# Patient Record
Sex: Female | Born: 1960 | Race: White | Hispanic: No | Marital: Married | Smoking: Former smoker
Health system: Southern US, Community
[De-identification: ages and names within clinical notes are randomized; demographics above are authoritative.]

## PROBLEM LIST (undated history)

## (undated) DIAGNOSIS — F419 Anxiety disorder, unspecified: Secondary | ICD-10-CM

## (undated) DIAGNOSIS — Z8669 Personal history of other diseases of the nervous system and sense organs: Secondary | ICD-10-CM

## (undated) DIAGNOSIS — Z87440 Personal history of urinary (tract) infections: Secondary | ICD-10-CM

## (undated) DIAGNOSIS — L409 Psoriasis, unspecified: Secondary | ICD-10-CM

## (undated) DIAGNOSIS — R519 Headache, unspecified: Secondary | ICD-10-CM

## (undated) DIAGNOSIS — R112 Nausea with vomiting, unspecified: Secondary | ICD-10-CM

## (undated) DIAGNOSIS — Z8719 Personal history of other diseases of the digestive system: Secondary | ICD-10-CM

## (undated) DIAGNOSIS — G8929 Other chronic pain: Secondary | ICD-10-CM

## (undated) DIAGNOSIS — T8859XA Other complications of anesthesia, initial encounter: Secondary | ICD-10-CM

## (undated) DIAGNOSIS — E785 Hyperlipidemia, unspecified: Secondary | ICD-10-CM

## (undated) DIAGNOSIS — R51 Headache: Secondary | ICD-10-CM

## (undated) DIAGNOSIS — T4145XA Adverse effect of unspecified anesthetic, initial encounter: Secondary | ICD-10-CM

## (undated) DIAGNOSIS — Z87898 Personal history of other specified conditions: Secondary | ICD-10-CM

## (undated) DIAGNOSIS — Z9889 Other specified postprocedural states: Secondary | ICD-10-CM

## (undated) DIAGNOSIS — L719 Rosacea, unspecified: Secondary | ICD-10-CM

## (undated) DIAGNOSIS — M542 Cervicalgia: Secondary | ICD-10-CM

## (undated) DIAGNOSIS — R3912 Poor urinary stream: Secondary | ICD-10-CM

## (undated) DIAGNOSIS — I10 Essential (primary) hypertension: Secondary | ICD-10-CM

## (undated) DIAGNOSIS — G4733 Obstructive sleep apnea (adult) (pediatric): Secondary | ICD-10-CM

## (undated) DIAGNOSIS — K219 Gastro-esophageal reflux disease without esophagitis: Secondary | ICD-10-CM

## (undated) HISTORY — DX: Other chronic pain: G89.29

## (undated) HISTORY — PX: ABDOMINAL HYSTERECTOMY: SHX81

## (undated) HISTORY — DX: Psoriasis, unspecified: L40.9

## (undated) HISTORY — PX: DIAGNOSTIC LAPAROSCOPY: SUR761

## (undated) HISTORY — DX: Personal history of other diseases of the nervous system and sense organs: Z86.69

## (undated) HISTORY — PX: OTHER SURGICAL HISTORY: SHX169

## (undated) HISTORY — PX: SPINE SURGERY: SHX786

## (undated) HISTORY — PX: TUBAL LIGATION: SHX77

## (undated) HISTORY — PX: DILATION AND CURETTAGE OF UTERUS: SHX78

## (undated) HISTORY — DX: Rosacea, unspecified: L71.9

## (undated) HISTORY — DX: Cervicalgia: M54.2

## (undated) HISTORY — PX: APPENDECTOMY: SHX54

## (undated) HISTORY — DX: Essential (primary) hypertension: I10

## (undated) HISTORY — DX: Hyperlipidemia, unspecified: E78.5

## (undated) HISTORY — PX: CHOLECYSTECTOMY: SHX55

## (undated) HISTORY — DX: Obstructive sleep apnea (adult) (pediatric): G47.33

---

## 1997-05-27 ENCOUNTER — Ambulatory Visit (HOSPITAL_COMMUNITY): Admission: RE | Admit: 1997-05-27 | Discharge: 1997-05-27 | Payer: Self-pay | Admitting: Obstetrics and Gynecology

## 1997-11-13 ENCOUNTER — Other Ambulatory Visit: Admission: RE | Admit: 1997-11-13 | Discharge: 1997-11-13 | Payer: Self-pay | Admitting: Obstetrics and Gynecology

## 1997-12-04 ENCOUNTER — Ambulatory Visit (HOSPITAL_COMMUNITY): Admission: RE | Admit: 1997-12-04 | Discharge: 1997-12-04 | Payer: Self-pay | Admitting: *Deleted

## 1999-02-24 ENCOUNTER — Other Ambulatory Visit: Admission: RE | Admit: 1999-02-24 | Discharge: 1999-02-24 | Payer: Self-pay | Admitting: Obstetrics and Gynecology

## 1999-06-14 ENCOUNTER — Inpatient Hospital Stay (HOSPITAL_COMMUNITY): Admission: RE | Admit: 1999-06-14 | Discharge: 1999-06-16 | Payer: Self-pay | Admitting: Obstetrics and Gynecology

## 1999-06-14 ENCOUNTER — Encounter (INDEPENDENT_AMBULATORY_CARE_PROVIDER_SITE_OTHER): Payer: Self-pay | Admitting: Specialist

## 1999-12-29 ENCOUNTER — Emergency Department (HOSPITAL_COMMUNITY): Admission: EM | Admit: 1999-12-29 | Discharge: 1999-12-29 | Payer: Self-pay | Admitting: Emergency Medicine

## 1999-12-29 ENCOUNTER — Encounter: Payer: Self-pay | Admitting: Emergency Medicine

## 2000-06-27 ENCOUNTER — Other Ambulatory Visit: Admission: RE | Admit: 2000-06-27 | Discharge: 2000-06-27 | Payer: Self-pay | Admitting: Obstetrics and Gynecology

## 2001-10-09 ENCOUNTER — Other Ambulatory Visit: Admission: RE | Admit: 2001-10-09 | Discharge: 2001-10-09 | Payer: Self-pay | Admitting: Obstetrics and Gynecology

## 2002-10-08 ENCOUNTER — Encounter: Payer: Self-pay | Admitting: Neurosurgery

## 2002-10-08 ENCOUNTER — Ambulatory Visit (HOSPITAL_COMMUNITY): Admission: RE | Admit: 2002-10-08 | Discharge: 2002-10-09 | Payer: Self-pay | Admitting: Neurosurgery

## 2002-11-07 ENCOUNTER — Other Ambulatory Visit: Admission: RE | Admit: 2002-11-07 | Discharge: 2002-11-07 | Payer: Self-pay | Admitting: Obstetrics and Gynecology

## 2003-11-25 ENCOUNTER — Observation Stay (HOSPITAL_COMMUNITY): Admission: RE | Admit: 2003-11-25 | Discharge: 2003-11-26 | Payer: Self-pay | Admitting: Neurosurgery

## 2004-08-27 ENCOUNTER — Encounter: Admission: RE | Admit: 2004-08-27 | Discharge: 2004-11-25 | Payer: Self-pay | Admitting: Internal Medicine

## 2005-12-10 ENCOUNTER — Encounter: Admission: RE | Admit: 2005-12-10 | Discharge: 2005-12-10 | Payer: Self-pay | Admitting: Neurosurgery

## 2007-07-25 ENCOUNTER — Emergency Department (HOSPITAL_COMMUNITY): Admission: EM | Admit: 2007-07-25 | Discharge: 2007-07-25 | Payer: Self-pay | Admitting: Family Medicine

## 2007-10-18 ENCOUNTER — Ambulatory Visit: Payer: Self-pay | Admitting: Internal Medicine

## 2007-10-22 ENCOUNTER — Ambulatory Visit: Payer: Self-pay | Admitting: Internal Medicine

## 2007-12-20 ENCOUNTER — Ambulatory Visit: Payer: Self-pay | Admitting: Internal Medicine

## 2008-04-01 ENCOUNTER — Ambulatory Visit: Payer: Self-pay | Admitting: Internal Medicine

## 2008-09-09 ENCOUNTER — Ambulatory Visit: Payer: Self-pay | Admitting: Internal Medicine

## 2008-10-07 ENCOUNTER — Ambulatory Visit: Payer: Self-pay | Admitting: Internal Medicine

## 2008-10-16 ENCOUNTER — Ambulatory Visit: Payer: Self-pay | Admitting: Internal Medicine

## 2008-11-11 ENCOUNTER — Ambulatory Visit: Payer: Self-pay | Admitting: Internal Medicine

## 2008-11-24 ENCOUNTER — Ambulatory Visit: Payer: Self-pay | Admitting: Internal Medicine

## 2008-12-09 ENCOUNTER — Ambulatory Visit: Payer: Self-pay | Admitting: Internal Medicine

## 2008-12-16 ENCOUNTER — Ambulatory Visit: Payer: Self-pay | Admitting: Internal Medicine

## 2009-01-19 ENCOUNTER — Ambulatory Visit: Payer: Self-pay | Admitting: Internal Medicine

## 2009-02-10 ENCOUNTER — Ambulatory Visit: Payer: Self-pay | Admitting: Internal Medicine

## 2009-03-24 ENCOUNTER — Ambulatory Visit: Payer: Self-pay | Admitting: Internal Medicine

## 2009-08-17 ENCOUNTER — Ambulatory Visit: Payer: Self-pay | Admitting: Internal Medicine

## 2009-12-04 ENCOUNTER — Ambulatory Visit: Payer: Self-pay | Admitting: Internal Medicine

## 2010-05-10 ENCOUNTER — Emergency Department (HOSPITAL_COMMUNITY): Payer: BC Managed Care – PPO

## 2010-05-10 ENCOUNTER — Emergency Department (HOSPITAL_COMMUNITY)
Admission: EM | Admit: 2010-05-10 | Discharge: 2010-05-10 | Disposition: A | Discharge status: Home / Self Care | Payer: BC Managed Care – PPO | Attending: Emergency Medicine | Admitting: Emergency Medicine

## 2010-05-10 DIAGNOSIS — S9030XA Contusion of unspecified foot, initial encounter: Secondary | ICD-10-CM | POA: Insufficient documentation

## 2010-05-10 DIAGNOSIS — Y9210 Unspecified residential institution as the place of occurrence of the external cause: Secondary | ICD-10-CM | POA: Insufficient documentation

## 2010-05-10 DIAGNOSIS — W208XXA Other cause of strike by thrown, projected or falling object, initial encounter: Secondary | ICD-10-CM | POA: Insufficient documentation

## 2010-05-10 DIAGNOSIS — F3289 Other specified depressive episodes: Secondary | ICD-10-CM | POA: Insufficient documentation

## 2010-05-10 DIAGNOSIS — M79609 Pain in unspecified limb: Secondary | ICD-10-CM | POA: Insufficient documentation

## 2010-05-10 DIAGNOSIS — I10 Essential (primary) hypertension: Secondary | ICD-10-CM | POA: Insufficient documentation

## 2010-05-10 DIAGNOSIS — F329 Major depressive disorder, single episode, unspecified: Secondary | ICD-10-CM | POA: Insufficient documentation

## 2010-05-10 DIAGNOSIS — Z79899 Other long term (current) drug therapy: Secondary | ICD-10-CM | POA: Insufficient documentation

## 2010-05-10 DIAGNOSIS — Z794 Long term (current) use of insulin: Secondary | ICD-10-CM | POA: Insufficient documentation

## 2010-05-10 DIAGNOSIS — E119 Type 2 diabetes mellitus without complications: Secondary | ICD-10-CM | POA: Insufficient documentation

## 2010-05-10 DIAGNOSIS — M7989 Other specified soft tissue disorders: Secondary | ICD-10-CM | POA: Insufficient documentation

## 2010-05-10 DIAGNOSIS — Z9889 Other specified postprocedural states: Secondary | ICD-10-CM | POA: Insufficient documentation

## 2010-05-20 ENCOUNTER — Other Ambulatory Visit: Payer: Self-pay | Admitting: Internal Medicine

## 2010-05-21 NOTE — Discharge Summary (Signed)
[facility]  Patient:    AADYA, KINDLER                    MRN: [id number] Adm. Date:  [id number] Disc. Date: [id number] Attending:  Osborn Coho                           Discharge Summary  PRINCIPAL DISCHARGE DIAGNOSES: 1. Metrorrhagia. 2. Chronic pelvic pain.  PRINCIPAL PROCEDURES:  Total abdominal hysterectomy with right salpingo-oophorectomy.  HISTORY OF PRESENT ILLNESS:  Ms. Leclere is a 50 year old white female, G2, P2, who presented for total abdominal hysterectomy with possible bilateral salpingo-oophorectomy on June 14, 1999, secondary to a worsening history of metrorrhagia and pelvic pain.  The patient had two previous laparoscopies to tread adhesions and endometriosis prior to this procedure being performed. She also had a hysteroscopy with dilatation and curettage.  For complete description of the events which led up to this procedure, please see the dictated history and physical.  HOSPITAL COURSE:  The patient underwent a total abdominal hysterectomy with right salpingo-oophorectomy on June 14, 1999.  A complete description of this procedure can be found in the dictated operative note.  The pathology was benign.  The patients postoperative course was unremarkable.  She was discharged to home on postoperative day #2.  At that time she was ambulating without difficulty and eating a regular diet.  DISPOSITION:  The patient was discharged to home.  FOLLOW-UP:  She was instructed to follow up in the office in one week.  DISCHARGE MEDICATIONS:  She was sent home with Tylox to take p.r.n.  CONDITION ON DISCHARGE:  Stable. DD:  09/03/99 TD:  09/06/99 Job: [id number]

## 2010-05-21 NOTE — Op Note (Signed)
[facility]  Patient:    Melissa Benson, Melissa Benson                    MRN: [id number] Proc. Date: 06/14/99 Adm. Date:  [id number] Attending:  Osborn Coho                           Operative Report  PREOPERATIVE DIAGNOSES: 1. Menometrorrhagia. 2. Chronic pelvic pain. 3. History of endometriosis.  POSTOPERATIVE DIAGNOSES: 1. Menometrorrhagia. 2. Chronic pelvic pain. 3. History of endometriosis.  PROCEDURE:  Total abdominal hysterectomy with right salpingo-oophorectomy.  SURGEON:  Mark E. Dareen Piano, M.D.  ASSISTANT:  Luvenia Redden, M.D.  ANESTHESIA:  General endotracheal.  ANTIBIOTICS:  Ancef 1 g.  DRAINS:  Foley to bedside drainage.  ESTIMATED BLOOD LOSS:  300 cc.  COMPLICATIONS:  None.  SPECIMENS: 1. Skin tag from the mons. 2. Uterus and cervix. 3. Right tube and ovary.  DESCRIPTION OF PROCEDURE:  The patient was taken to the operating room, where she was placed in the dorsal supine position.  A general endotracheal anesthetic was administered without complications.  She was then prepped in the usual fashion for this procedure.  A Foley catheter was placed.  A Pfannenstiel incision was made.  This was carried down to the fascia.  The fascia was entered in the midline and extended laterally.  The rectus muscles were then sharply dissected from the fascia.  The rectus muscles were divided in the midline and taken superiorly and inferiorly.  Parietal peritoneum was entered sharply.  The patient was then placed in Trendelenburg.  Examination revealed that the patient had an omental adhesion across the entire anterior abdominal wall in the right upper quadrant.  This is where a past cholecystectomy was performed.  There were also adhesions to the umbilical hernia repair in the midline.  The para-aortic and pelvic lymph nodes were normal.  Kidneys were normal.  At this point, OConnor-OSullivan retractor was placed; however, due to the  patients obesity, this was not successful and a large Balfour was used.  The bowel was then packed away with two wet laps. On examination, the patient was noted to have some endometriosis in the right adnexa.  The left adnexa appeared to be normal.  There was no endometriosis in the pelvis or in the posterior cul-de-sac or the anterior cul-de-sac.  At this point, the round ligament on the left was ligated with 0 Monocryl suture and transected with the Bovie.  The anterior and posterior leaf of the broad ligament were then opened.  A window was made in the posterior leaf of the broad ligament, and the ovarian ligament and fallopian tube were clamped, cut, and ligated x 2 with 0 Monocryl suture.  The uterine vessels were then skeletonized, clamped, cut, and ligated with 0 Monocryl suture.  On the right side, the round ligament was ligated with 0 Monocryl suture and transected with the Bovie.  The broad leaf was then opened, the infundibulopelvic ligament opened, clamped, cut, and ligated with 0 Monocryl suture x 2.  The uterine vessels were then skeletonized, clamped, cut, and ligated with 0 Monocryl suture.  The cardinal ligaments were serially clamped, cut, and ligated with 0 Monocryl suture.  Once the level of the external os was reached a clamp was placed on the right and the vagina was entered.  The vagina was then circumscribed with the Satinsky scissors and the cervix and uterus removed.  At  this point, sutures were placed at the vaginal angles using one 0 Vicryl suture in a Heaney fashion.  The remaining cuff was closed using one Vicryl suture in a running locking fashion.  The pelvis was then copiously irrigated and several small areas of bleeding were made hemostatic with the Bovie.  Again, the left ovary was inspected and appeared to be normal.  At this point, the retractor was removed, the laps were removed.  The parietal peritoneum and rectus muscles were then reapproximated in  the midline using 2-0 Monocryl in a running fashion.  The fascia was closed using 0 Monocryl suture in a running fashion.  Subcuticular tissue was made hemostatic with the Bovie, 2-0 plain gut suture was used in interrupted fashion to close the subcuticular tissue, stainless steel clips were used to close the skin.  The patient tolerated the procedure well.  She was taken to the recovery room in stable condition.  Instrument and lap counts were correct x 2. DD:  06/14/99 TD:  06/16/99 Job: [id number]

## 2010-05-21 NOTE — Op Note (Signed)
NAME:  Melissa Benson, Melissa Benson                       ACCOUNT NO.:  [id number]   MEDICAL RECORD NO.:  [id number]                   PATIENT TYPE:  OIB   LOCATION:  NA                                   FACILITY:  [facility]   PHYSICIAN:  Danae Orleans. Venetia Maxon, M.D.               DATE OF BIRTH:  1960/02/11   DATE OF PROCEDURE:  10/08/2002  DATE OF DISCHARGE:                                 OPERATIVE REPORT   PREOPERATIVE DIAGNOSIS:  Herniated cervical disc C6-C7 with spondylosis,  degenerative disc disease, and cervical radiculopathy.   POSTOPERATIVE DIAGNOSIS:  Herniated cervical disc C6-C7 with spondylosis,  degenerative disc disease, and cervical radiculopathy.   PROCEDURE:  Anterior cervical decompression and fusion C6-C7 with allograft  and anterior cervical plates.   SURGEON:  Danae Orleans. Venetia Maxon, M.D.   ANESTHESIA:  General endotracheal anesthesia.   ESTIMATED BLOOD LOSS:  Minimal.   COMPLICATIONS:  None.   DISPOSITION:  Recovery room.   INDICATIONS FOR PROCEDURE:  Makinzee Durley is a 50 year old woman with  severe neck and right upper extremity with a large herniated disc at the C6-  C7 level on the right.  It was elected to take her to surgery for anterior  cervical decompression and fusion at this affected level.   PROCEDURE:  Ms. Maclin was brought to the operating room.  Following  satisfactory uncomplicated induction of general endotracheal anesthesia and  placement of intravenous  lines, the patient was placed in a supine position  on the operating table.  Her neck was placed in slight extension.  She was  placed in 10 pounds of halter traction.  Her anterior neck was then prepped  and draped in the usual sterile fashion.  An incision was made from the  midline to the anterior border of the sternocleidomastoid muscle overlying  the lowest neck creases and carried through approximately 1-5 inches of  adipost tissue to the border of the sternocleidomastoid muscle.  Using blunt  dissection, the carotid sheath was kept lateral, the trachea and esophagus  kept medial, and the anterior cervical spine was identified.  A level which  was felt to correspond to the C5-C6 level had a bent spinal needle placed  and this was confirmed on interoperative x-ray.  It was not possible to  visualize C6-C7 because of the patient's large body habitus.  The longus  colli muscles were taken down from the anterior cervical spine from the C6  to C7 bilaterally using electrocautery and Key elevator.  A self-retaining  Shadowline retractor was placed to facilitate exposure of the C6-C7  interspaces.  This was then incised with a 15 blade and disc material was  removed in a piecemeal fashion using a variety of Carlens curets and  pituitary rongeurs.  The disc spreader was placed.  There was evidence of a  subligamentous disc herniation at C6-C7 on the right with significant  compression of the right C7 nerve  root and lateral aspect of the spinal  cord.  This was decompressed.  The posterior longitudinal ligament was then  removed in a piecemeal fashion along with uncinate spurs which were drilled  down using an A2 equivalent bur.  The endplates were also decorticated.  After utilizing a trial sizer which demonstrated good sizing of the 8 mm  graft, an 8 mm cortical cancellous bone graft was reconstituted in  Bacitracin and inserted in the interspace and counter sunk appropriately.  The 22 mm Trinica anterior cervical plate was then affixed to the anterior  cervical spine using 14 mm variable angle screws at C6 and C7, two at each  level.  The locking mechanisms were engaged.  Final x-ray was not obtained  because it was felt that it would not be possible to visualize to this  level.  The wound was then copiously irrigated with Bacitracin and saline  and the platysmal layer was closed with 3-0 Vicryl sutures and the skin  edges were reapproximated with a running 4-0 Vicryl subcuticular  stitch.  The wound was dressed with Dermabond.  The patient was extubated in the  operating room and taken to the recovery room in stable satisfactory  condition having tolerated the operation well.  Counts were correct at the  end of the case.                                               Danae Orleans. Venetia Maxon, M.D.    JDS/MEDQ  D:  10/08/2002  T:  10/08/2002  Job:  [id number]

## 2010-05-21 NOTE — Op Note (Signed)
NAMEALONZO, Melissa Benson NO.:  [id number]   MEDICAL RECORD NO.:  [id number]          PATIENT TYPE:  INP   LOCATION:  2899                         FACILITY:  [facility]   PHYSICIAN:  Danae Orleans. Venetia Maxon, M.D.  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   OPERATION PERFORMED:  Posterior cervical fusion C6-7 with Vertex system  along with Kronos bone allograft substitute and bone morphogenic protein.   SURGEON:  Danae Orleans. Venetia Maxon, M.D.   ANESTHESIA:  General endotracheal.   ESTIMATED BLOOD LOSS:  Minimal.   COMPLICATIONS:  None.   DISPOSITION:  Recovery.   INDICATIONS FOR PROCEDURE:  Melissa Benson is a 50 year old woman who had  previously undergone anterior cervical decompression and fusion at the C6-7  level.  She did well following surgery but then came back to the office  complaining of significant neck and upper extremity pain and was found to  have a broken screw at C7 suggestive of pseudoarthrosis.  She had an MRI  which did not show any other significant pathology at other levels and  because of the patient's large size and morbid obesity and very short neck,  it was felt that it would be more prudent to go ahead with revision surgery  from the posterior approach to treat her pseudoarthrosis with posterior  cervical instrumentation and fusion.   DESCRIPTION OF PROCEDURE:  Ms. Gregory was brought to the operating room.  Following satisfactory and uncomplicated induction of general endotracheal  anesthesia and placement of intravenous lines, the patient was placed in  three-pin head fixation, was turned carefully into a prone position on chest  rolls and her head was locked with the Mayfield headholder in neutral  alignment.  Her shoulders were taped after appropriate padding with tape to  facilitate x-ray visualization.  The C-arm was used and because of the  patient's large body habitus, it was not possible to see down to the C6-7  level; however, it was possible to see to C5.  Her posterior neck was then  shaved, prepped and draped in the usual sterile fashion.  The area of  planned incision was infiltrated with 0.25% Marcaine and 0.5% lidocaine  1:200,000 epinephrine.  Incision was made overlying the C5 through C7  levels, carried through copious adipose tissue to the posterior cervical  fascia which was incised bilaterally, subperiosteal dissection was performed  exposing the C5 spinous processes and C6 and C7 spinous processes, laminae,  lateral masses.  Self-retaining retractor was placed to facilitate exposure.  Using intraoperative x-ray, a marker probe was placed at what was felt to be  the C5 spinous process and intraoperative x-ray confirmed this to be the C5  level.  The C6-7 level appeared to have reduced mobility compared to normal  but clearly there was motion at this level.  It  was therefore elected to  proceed with plan and the C6-7 facet joints were decorticated.  Bone  morphogenic protein was mixed.  Posterior cervical instrumentation placed  with 14 mm Vertex screws in the lateral masses of C6 and C7.  All screws had  excellent purchase and their positioning was performed in the usual fashion  with appropriate angulation of the screws. The rods were cut to the  appropriate length.  The facet joints and laminae were decorticated  overlying C6 and C7 levels.  Bone morphogenic protein was then placed along  with medium size Kronos bone allograft substitute and this was packed into  facet joints and along the posterior elements at the appropriate level.  Prior to doing so, the wound was copiously irrigated with  bacitracin saline.  The posterior cervical fascia was then closed with 0 Vicryl sutures.  The  subcutaneous tissue was reapproximated with 2-0 Vicryl interrupted inverted  sutures and skin edges were reapproximated with interrupted 3-0 Vicryl  subcuticular stitch.  The wound was dressed with benzoin and Steri-Strips,  Telfa gauze and tape.  The patient was extubated in the operating room and  taken to the recovery room in stable and satisfactory condition having  tolerated the operation well.  Counts were correct at the end of the case.      Jose   JDS/MEDQ  D:  11/25/2003  T:  11/25/2003  Job:  [id number]

## 2010-05-21 NOTE — H&P (Signed)
[facility]  Patient:    Melissa Benson, Melissa Benson                    MRN: [id number] Adm. Date:  [id number] Attending:  Osborn Coho                         History and Physical  HISTORY OF PRESENT ILLNESS:  Ms. Pistole is a 50 year old white female G2, P2 who presents today for TAH and possible BSO secondary to a history of worsening metrorrhagia and pelvic pain. The patient has been bothered with these symptoms for several years. She has undergone two laparoscopies to treat adhesions and endometriosis. She has also had a hysteroscopy with dilatation and curettage. The patient states that over the last 3-4 months, she has had worsening symptoms of pelvic pain. The pain begins approximately 10 days before her menstrual cycle and ends when her menstrual cycle begins. She also has occasional dyspareunia.  PAST MEDICAL HISTORY:  SURGICAL HISTORY:  Cholecystectomy, appendectomy, umbilical hernia repair. She has also had the surgeries listed above.  ALLERGIES:  MORPHINE, CODEINE and TYLOX which causes nausea and vomiting. She is currently taking Motrin as needed. She has on smoking or alcohol history.  PHYSICAL EXAMINATION:  GENERAL:  The patient is an overweight white female in no apparent distress.  HEENT:  Within normal limits.  LUNGS:  Clear to auscultation.  CARDIOVASCULAR:  Reveals a regular rate and rhythm without a murmur.  ABDOMEN:  Obese, nontender, nondistended. There is no organomegaly. She has 3 abdominal scars.  EXTREMITIES:  Within normal limits.  PELVIC:  Normal external genitalia. The vagina is without lesions or discharge. The cervix is parous. There is no cervical motion tenderness. The uterus is not easily palpated. There are no masses palpated in the pelvis.  IMPRESSION: 1. Metrorrhagia. 2. Chronic pelvic pain. 3. Probable recurrence of endometriosis.  PLAN:  Proceed with total abdominal hysterectomy, possible  bilateral salpingo-oophorectomy. DD:  06/14/99 TD:  06/14/99 Job: [id number]

## 2010-06-03 ENCOUNTER — Ambulatory Visit
Admission: RE | Admit: 2010-06-03 | Discharge: 2010-06-03 | Disposition: A | Discharge status: Home / Self Care | Payer: BC Managed Care – PPO | Source: Ambulatory Visit | Attending: Internal Medicine | Admitting: Internal Medicine

## 2010-06-03 ENCOUNTER — Other Ambulatory Visit: Payer: Self-pay | Admitting: Internal Medicine

## 2010-06-03 ENCOUNTER — Ambulatory Visit (INDEPENDENT_AMBULATORY_CARE_PROVIDER_SITE_OTHER): Payer: BC Managed Care – PPO | Admitting: Internal Medicine

## 2010-06-03 ENCOUNTER — Encounter: Payer: Self-pay | Admitting: Internal Medicine

## 2010-06-03 VITALS — BP 142/78 | HR 108 | Temp 99.6°F | Ht 64.5 in | Wt 247.0 lb

## 2010-06-03 DIAGNOSIS — IMO0002 Reserved for concepts with insufficient information to code with codable children: Secondary | ICD-10-CM | POA: Insufficient documentation

## 2010-06-03 DIAGNOSIS — T1490XA Injury, unspecified, initial encounter: Secondary | ICD-10-CM

## 2010-06-03 DIAGNOSIS — B9789 Other viral agents as the cause of diseases classified elsewhere: Secondary | ICD-10-CM

## 2010-06-03 DIAGNOSIS — E1165 Type 2 diabetes mellitus with hyperglycemia: Secondary | ICD-10-CM | POA: Insufficient documentation

## 2010-06-03 DIAGNOSIS — Z9989 Dependence on other enabling machines and devices: Secondary | ICD-10-CM | POA: Insufficient documentation

## 2010-06-03 DIAGNOSIS — J029 Acute pharyngitis, unspecified: Secondary | ICD-10-CM

## 2010-06-03 DIAGNOSIS — G4733 Obstructive sleep apnea (adult) (pediatric): Secondary | ICD-10-CM | POA: Insufficient documentation

## 2010-06-03 DIAGNOSIS — I10 Essential (primary) hypertension: Secondary | ICD-10-CM | POA: Insufficient documentation

## 2010-06-03 DIAGNOSIS — B349 Viral infection, unspecified: Secondary | ICD-10-CM

## 2010-06-03 LAB — POCT RAPID STREP A (OFFICE): Rapid Strep A Screen: NEGATIVE

## 2010-06-03 MED ORDER — PROMETHAZINE HCL 25 MG PO TABS: 25.0000 mg | ORAL_TABLET | Freq: Four times a day (QID) | ORAL | Status: DC | PRN

## 2010-06-03 NOTE — Progress Notes (Signed)
  Subjective:    Patient ID: Melissa Benson, female    DOB: Mar 26, 1960, 50 y.o.   MRN: [id number]    HPI2 day hx of N and V with sore throat. No cough. No rhinorrhea. Granddaughter dx with hand foot and mouth disease this week. Pt has had temp up to 100.4 degrees. Malaise and fatigue. Out of work yesterday and today. Also, has headache.  Also, around May 7th, an oxygen tank dropped accidentally on her foot at [facility] while she was with her father who had cardiac cath there. She was taken to ER in wheelchair and at ER registration nurse accidentally pushed wheelchair and jammed her Right toes into a desk. Could not move toes for a couple of hours. Still sore and a bit sore over dorsal aspect of foot.    Review of Systems     Objective:   Physical Exam  HENT:  Mouth/Throat: Oropharynx is clear and moist. No oropharyngeal exudate.       TMs clear   Eyes: Right eye exhibits no discharge. Left eye exhibits no discharge.  Neck: Neck supple.  Pulmonary/Chest: Breath sounds normal. No respiratory distress. She has no wheezes. She has no rales.  Lymphadenopathy:    She has no cervical adenopathy.   Right Foot: tender over 1st and 2nd metatarsals.        Assessment & Plan:  Imp- Viral Syndrome- Rapid strep screen is negative.      -   Contusion Right foot r/o  Occult Fracture Plan Re-Xray foot.  BJ:[id number] 25mg  tabs (#30) one po q 6 hours prn nausea

## 2010-06-03 NOTE — Patient Instructions (Signed)
Clear liquids until nausea has resolved then advance diet slowly. Obtain 4 inch ace wrap to wrap your right foot. Apply ice to foot 20 minutes twice a day. Stay out of work tomorrow.

## 2010-09-29 ENCOUNTER — Other Ambulatory Visit: Payer: Self-pay | Admitting: Internal Medicine

## 2010-10-30 ENCOUNTER — Other Ambulatory Visit: Payer: Self-pay | Admitting: Internal Medicine

## 2010-12-02 ENCOUNTER — Encounter: Payer: Self-pay | Admitting: Internal Medicine

## 2010-12-02 ENCOUNTER — Ambulatory Visit (INDEPENDENT_AMBULATORY_CARE_PROVIDER_SITE_OTHER): Payer: BC Managed Care – PPO | Admitting: Internal Medicine

## 2010-12-02 VITALS — BP 134/72 | HR 92 | Temp 98.8°F | Wt 248.0 lb

## 2010-12-02 DIAGNOSIS — G8929 Other chronic pain: Secondary | ICD-10-CM

## 2010-12-02 DIAGNOSIS — Z8669 Personal history of other diseases of the nervous system and sense organs: Secondary | ICD-10-CM

## 2010-12-02 DIAGNOSIS — Z79899 Other long term (current) drug therapy: Secondary | ICD-10-CM

## 2010-12-02 DIAGNOSIS — M542 Cervicalgia: Secondary | ICD-10-CM

## 2010-12-02 DIAGNOSIS — E785 Hyperlipidemia, unspecified: Secondary | ICD-10-CM

## 2010-12-02 DIAGNOSIS — E119 Type 2 diabetes mellitus without complications: Secondary | ICD-10-CM

## 2010-12-02 DIAGNOSIS — Z23 Encounter for immunization: Secondary | ICD-10-CM

## 2010-12-02 LAB — HEPATIC FUNCTION PANEL
ALT: 45 U/L — ABNORMAL HIGH (ref 0–35)
AST: 41 U/L — ABNORMAL HIGH (ref 0–37)
Albumin: 4.7 g/dL (ref 3.5–5.2)

## 2010-12-02 LAB — LIPID PANEL
Cholesterol: 176 mg/dL (ref 0–200)
HDL: 45 mg/dL (ref >39)
Total CHOL/HDL Ratio: 3.9 Ratio
Triglycerides: 118 mg/dL (ref <150)

## 2010-12-02 LAB — HEMOGLOBIN A1C: Hgb A1c MFr Bld: 6.7 % — ABNORMAL HIGH (ref <5.7)

## 2010-12-02 NOTE — Progress Notes (Signed)
  Subjective:    Patient ID: Melissa Benson, female    DOB: [birth year], 50 y.o.   MRN: [id number]  HPI 50 year old white female with history of hypertension, diabetes mellitus, chronic left neck pain, migraine headaches, sleep apnea for six-month recheck. She had to cancel recent appointment with Dr. Lucianne Muss, endocrinologist because patient's father passed away. He apparently had a lung condition and had respiratory failure. Patient is off Crestor at the present time. She did not get her prescription refilled recently. Fasting lipid panel has been drawn. Influenza immunization given today. Blood pressure brings in multiple Accu-Chek readings which are quite acceptable. Says she's only been taking insulin once daily instead of twice daily. She has rescheduled her appointment with Dr. Lucianne Muss for December.      Review of Systems     Objective:   Physical Exam chest clear to auscultation; cardiac exam regular rate and rhythm; extremities without edema. Diabetic foot exam: No ulcers in pulses are within normal limits.        Assessment & Plan:  Diabetes mellitus  Hypertension  Sleep apnea  Chronic neck pain-stable  History of migraine headaches  Grief reaction secondary to father's passing  Plan: Return in 6 months for physical examination. Refill Flexeril 10 mg (#30) 1/2-1 by mouth each bedtime with when necessary 1 year refill. Keep appointment with Dr. Lucianne Muss. Continue same blood pressure regimen. Review lipid panel and make further recommendations regarding possible restarting of Crestor.

## 2010-12-04 DIAGNOSIS — G8929 Other chronic pain: Secondary | ICD-10-CM | POA: Insufficient documentation

## 2010-12-04 DIAGNOSIS — M542 Cervicalgia: Secondary | ICD-10-CM | POA: Insufficient documentation

## 2010-12-04 DIAGNOSIS — Z8669 Personal history of other diseases of the nervous system and sense organs: Secondary | ICD-10-CM | POA: Insufficient documentation

## 2010-12-04 NOTE — Patient Instructions (Signed)
Continue with same antihypertensive medication. Have refilled Flexeril for neck pain. We will review lipid panel and make recommendations regarding restarting Crestor. Return in 6 months. Influenza immunization given today. Reminded about diabetic eye exam.

## 2011-01-31 ENCOUNTER — Other Ambulatory Visit: Payer: Self-pay | Admitting: Internal Medicine

## 2011-01-31 ENCOUNTER — Telehealth: Payer: Self-pay

## 2011-01-31 NOTE — Telephone Encounter (Signed)
Medication ordered in duplicate

## 2011-01-31 NOTE — Telephone Encounter (Signed)
Please refill Lorcet 10/650 #60 with 2 refills.

## 2011-02-07 ENCOUNTER — Other Ambulatory Visit: Payer: Self-pay | Admitting: Internal Medicine

## 2011-02-08 ENCOUNTER — Other Ambulatory Visit: Payer: Self-pay

## 2011-03-09 ENCOUNTER — Other Ambulatory Visit: Payer: Self-pay | Admitting: Internal Medicine

## 2011-03-13 ENCOUNTER — Ambulatory Visit (INDEPENDENT_AMBULATORY_CARE_PROVIDER_SITE_OTHER): Payer: BC Managed Care – PPO | Admitting: Internal Medicine

## 2011-03-13 ENCOUNTER — Encounter: Payer: Self-pay | Admitting: Internal Medicine

## 2011-03-13 VITALS — BP 132/80 | HR 90 | Temp 99.0°F | Resp 16 | Ht 65.0 in | Wt 258.0 lb

## 2011-03-13 DIAGNOSIS — K122 Cellulitis and abscess of mouth: Secondary | ICD-10-CM

## 2011-03-13 DIAGNOSIS — K1370 Unspecified lesions of oral mucosa: Secondary | ICD-10-CM

## 2011-03-13 MED ORDER — AMOXICILLIN 500 MG PO CAPS: 1000.0000 mg | ORAL_CAPSULE | Freq: Two times a day (BID) | ORAL | Status: AC

## 2011-03-13 NOTE — Progress Notes (Signed)
  Subjective:    Patient ID: Melissa Benson, female    DOB: [birth year], 51 y.o.   MRN: [id number]  HPI  New swelling base of tooth very tender  Review of Systems Stable    Objective:   Physical Exam  Swollen, red, tender base of tooth      Assessment & Plan:   Abcess tooth  Amoxil Has own [facility] for pain  See dentist tomorrow.

## 2011-03-21 ENCOUNTER — Other Ambulatory Visit: Payer: Self-pay | Admitting: Internal Medicine

## 2011-04-11 ENCOUNTER — Other Ambulatory Visit: Payer: Self-pay | Admitting: Internal Medicine

## 2011-04-12 ENCOUNTER — Other Ambulatory Visit: Payer: Self-pay

## 2011-04-12 NOTE — Telephone Encounter (Signed)
Please call in refill x 6 months 

## 2011-05-08 ENCOUNTER — Other Ambulatory Visit: Payer: Self-pay | Admitting: Internal Medicine

## 2011-05-31 ENCOUNTER — Other Ambulatory Visit: Payer: BC Managed Care – PPO | Admitting: Internal Medicine

## 2011-05-31 DIAGNOSIS — Z0000 Encounter for general adult medical examination without abnormal findings: Secondary | ICD-10-CM

## 2011-05-31 LAB — CBC WITH DIFFERENTIAL/PLATELET
Basophils Relative: 0 % (ref 0–1)
Eosinophils Absolute: 0.1 10*3/uL (ref 0.0–0.7)
Eosinophils Relative: 2 % (ref 0–5)
MCH: 28.3 pg (ref 26.0–34.0)
MCHC: 33.1 g/dL (ref 30.0–36.0)
Monocytes Relative: 9 % (ref 3–12)
Neutrophils Relative %: 52 % (ref 43–77)
Platelets: 250 10*3/uL (ref 150–400)

## 2011-05-31 LAB — LIPID PANEL
HDL: 37 mg/dL — ABNORMAL LOW (ref >39)
LDL Cholesterol: 57 mg/dL (ref 0–99)
Total CHOL/HDL Ratio: 3.1 Ratio
Triglycerides: 93 mg/dL (ref <150)
VLDL: 19 mg/dL (ref 0–40)

## 2011-05-31 LAB — COMPREHENSIVE METABOLIC PANEL
Alkaline Phosphatase: 43 U/L (ref 39–117)
Creat: 0.72 mg/dL (ref 0.50–1.10)
Glucose, Bld: 104 mg/dL — ABNORMAL HIGH (ref 70–99)
Sodium: 145 mEq/L (ref 135–145)
Total Bilirubin: 0.3 mg/dL (ref 0.3–1.2)
Total Protein: 6.7 g/dL (ref 6.0–8.3)

## 2011-06-02 ENCOUNTER — Encounter: Payer: BC Managed Care – PPO | Admitting: Internal Medicine

## 2011-06-14 ENCOUNTER — Other Ambulatory Visit: Payer: Self-pay | Admitting: Internal Medicine

## 2011-06-16 ENCOUNTER — Other Ambulatory Visit: Payer: Self-pay

## 2011-07-11 ENCOUNTER — Encounter: Payer: Self-pay | Admitting: Internal Medicine

## 2011-07-11 ENCOUNTER — Ambulatory Visit (INDEPENDENT_AMBULATORY_CARE_PROVIDER_SITE_OTHER): Payer: BC Managed Care – PPO | Admitting: Internal Medicine

## 2011-07-11 VITALS — BP 158/92 | HR 104 | Temp 99.9°F | Ht 65.0 in | Wt 234.0 lb

## 2011-07-11 DIAGNOSIS — F32A Depression, unspecified: Secondary | ICD-10-CM

## 2011-07-11 DIAGNOSIS — Z8669 Personal history of other diseases of the nervous system and sense organs: Secondary | ICD-10-CM

## 2011-07-11 DIAGNOSIS — I498 Other specified cardiac arrhythmias: Secondary | ICD-10-CM

## 2011-07-11 DIAGNOSIS — G8929 Other chronic pain: Secondary | ICD-10-CM

## 2011-07-11 DIAGNOSIS — E119 Type 2 diabetes mellitus without complications: Secondary | ICD-10-CM

## 2011-07-11 DIAGNOSIS — IMO0001 Reserved for inherently not codable concepts without codable children: Secondary | ICD-10-CM

## 2011-07-11 DIAGNOSIS — R002 Palpitations: Secondary | ICD-10-CM

## 2011-07-11 DIAGNOSIS — M542 Cervicalgia: Secondary | ICD-10-CM

## 2011-07-11 DIAGNOSIS — R000 Tachycardia, unspecified: Secondary | ICD-10-CM

## 2011-07-11 DIAGNOSIS — F3289 Other specified depressive episodes: Secondary | ICD-10-CM

## 2011-07-11 DIAGNOSIS — F329 Major depressive disorder, single episode, unspecified: Secondary | ICD-10-CM

## 2011-07-11 LAB — POCT URINALYSIS DIPSTICK
Bilirubin, UA: NEGATIVE
Blood, UA: NEGATIVE
Ketones, UA: NEGATIVE
Protein, UA: NEGATIVE
Spec Grav, UA: 1.01
pH, UA: 5.5

## 2011-07-11 NOTE — Patient Instructions (Addendum)
Please go immediately to cardiology office for evaluation.

## 2011-07-11 NOTE — Progress Notes (Signed)
  Subjective:    Patient ID: Melissa Benson, female    DOB: 02-Nov-1960, 51 y.o.   MRN: [id number]  HPI 51 year old white female with history of diabetes mellitus treated with Glucotrol, metformin, Victoza, and Lantus insulin came in today for annual health maintenance exam. She awakened this morning feeling nauseated. She is just returning from a vacation. Doesn't feel that she's under any stress. Has had no fever or chills. However when she was in the waiting room she had an episode of palpitations lasting just a few minutes without shortness of breath or chest pain. After being taken back into the  examining room she was found to be tachycardic with rate of 120. Pulse was regular. Blood pressure initially was 158/92. Patient was diaphoretic. Subsequently blood pressure was rechecked with large cuff was 140/80. Patient says that toes are usually doesn't cause nausea. She has had no vomiting. No headache or urinary tract infection symptoms. Urinalysis today is normal. She is under the care of Dr. Lucianne Muss for diabetes and has an appointment in about a month. Had recent lab work done in late May 2013 including lipid panel, C. met, TSH and CBC all of which were normal. Hemoglobin A1c drawn today. EKG done in the office today shows sinus tachycardia rate of 120. Pulse oximetry 98 percent on room air. Patient has an old EKG from [facility] 2005 showing sinus tachycardia ventricular rate of 110. No recent episodes of palpitations.   Social history: Patient is married and works as a Haematologist. She has 2 sons  Past medical history appendectomy 1985, cholecystectomy 1986, bilateral tubal ligation 1984. Hysterectomy  with one ovary removed in 2003. Exploratory surgery for GYN issues x3. Surgery for ruptured cervical disc 2004 and 2005. 2 pregnancies, no miscarriages. History of depression. History of migraine headaches. History of sleep apnea.  Family history: Father with history of diabetes: Mother  history of heart disease  3 brothers all of which have substance abuse problems. 2 sisters one is a diabetic.  Review of Systems     Objective:   Physical Exam skin is diaphoretic, face is erythematous. Nodes none: HEENT exam: TMs and pharynx are clear: Neck is supple without JVD thyromegaly or carotid bruits. Chest clear to auscultation. Cardiac exam tachycardia regular rate and rhythm without murmur or gallop. Abdomen: obese soft nondistended no hepatosplenomegaly, masses, or tenderness. Extremities: without pitting edema.   breasts normal female without masses. Pelvic exam is deferred. She is alert and oriented x3. Neuro no focal deficits. Diabetic foot exam shows no ulcers or calluses. Pulses are normal in the feet.           New onset sinus tachycardia associated with nausea    Impression: Sinus tachycardia new onset with associated nausea  Diabetes mellitus-insulin-dependent  Migraine headaches  Sleep apnea  Obesity  Hypertension  History of migraine headaches  History of chronic left neck pain  History of depression  Plan: Contacted cardiologist and office visit advised. Hemoglobin A1c drawn today.

## 2011-07-20 HISTORY — PX: CARDIOVASCULAR STRESS TEST: SHX262

## 2011-07-20 HISTORY — PX: OTHER SURGICAL HISTORY: SHX169

## 2011-08-15 ENCOUNTER — Other Ambulatory Visit: Payer: Self-pay

## 2011-08-15 MED ORDER — ROSUVASTATIN CALCIUM 10 MG PO TABS: 10.0000 mg | ORAL_TABLET | Freq: Every day | ORAL | Status: DC

## 2011-08-15 MED ORDER — CYCLOBENZAPRINE HCL 10 MG PO TABS: 10.0000 mg | ORAL_TABLET | Freq: Three times a day (TID) | ORAL | Status: DC | PRN

## 2011-08-15 MED ORDER — HYDROCODONE-ACETAMINOPHEN 10-650 MG PO TABS: 1.0000 | ORAL_TABLET | Freq: Four times a day (QID) | ORAL | Status: DC | PRN

## 2011-08-19 ENCOUNTER — Ambulatory Visit (INDEPENDENT_AMBULATORY_CARE_PROVIDER_SITE_OTHER): Payer: BC Managed Care – PPO | Admitting: Internal Medicine

## 2011-08-19 ENCOUNTER — Encounter: Payer: Self-pay | Admitting: Internal Medicine

## 2011-08-19 VITALS — BP 136/84 | HR 88 | Temp 99.1°F | Ht 64.0 in | Wt 231.0 lb

## 2011-08-19 DIAGNOSIS — E119 Type 2 diabetes mellitus without complications: Secondary | ICD-10-CM

## 2011-08-19 DIAGNOSIS — H6011 Cellulitis of right external ear: Secondary | ICD-10-CM

## 2011-08-19 DIAGNOSIS — IMO0001 Reserved for inherently not codable concepts without codable children: Secondary | ICD-10-CM

## 2011-08-19 DIAGNOSIS — H60399 Other infective otitis externa, unspecified ear: Secondary | ICD-10-CM

## 2011-08-20 ENCOUNTER — Encounter: Payer: Self-pay | Admitting: Internal Medicine

## 2011-08-20 NOTE — Patient Instructions (Addendum)
Take Levaquin 500 milligrams daily for 7 days. Call if not better in 5-7 days or sooner if worse.

## 2011-08-20 NOTE — Progress Notes (Signed)
  Subjective:    Patient ID: Melissa Benson, female    DOB: Sep 24, 1960, 51 y.o.   MRN: [id number]  HPI 51 year old white female with history of diabetes mellitus on insulin in today with swollen right earlobe. Diabetes is fairly well controlled and is followed by endocrinologist. Says about a month ago she found a tick on her ear. Subsequently a week or two later, she noticed redness and swelling right earlobe. She has a pierced ear on the right. There has been no drainage from the piercing. Says ear is tender to touch.    Review of Systems     Objective:   Physical Exam right earlobe is thickened puffy and red. No drainage from piercing of right ear. Right external ear canal and right TM are clear. No facial swelling. Left ear is fine.        Assessment & Plan:  Cellulitis right earlobe-could of been started by insect bite but could be related to ear piercing as well Insulin-dependent diabetes mellitus Plan: Levaquin 500 milligrams daily for 7 days. Call if not better in 5-7 days or sooner if worse.

## 2011-09-17 ENCOUNTER — Other Ambulatory Visit: Payer: Self-pay | Admitting: Internal Medicine

## 2011-10-14 ENCOUNTER — Other Ambulatory Visit: Payer: Self-pay | Admitting: Internal Medicine

## 2011-10-14 ENCOUNTER — Other Ambulatory Visit: Payer: Self-pay

## 2011-10-14 MED ORDER — HYDROCODONE-ACETAMINOPHEN 10-650 MG PO TABS: 1.0000 | ORAL_TABLET | Freq: Three times a day (TID) | ORAL | Status: DC | PRN

## 2011-11-10 ENCOUNTER — Other Ambulatory Visit: Payer: Self-pay | Admitting: Internal Medicine

## 2012-01-06 ENCOUNTER — Other Ambulatory Visit: Payer: Self-pay | Admitting: Internal Medicine

## 2012-01-06 MED ORDER — HYDROCODONE-ACETAMINOPHEN 10-325 MG PO TABS
1.0000 | ORAL_TABLET | Freq: Three times a day (TID) | ORAL | Status: DC | PRN
Start: 2012-01-06 — End: 2012-04-23

## 2012-01-06 NOTE — Telephone Encounter (Signed)
Change to Hydrocodone APAP 10mg /325 #60 with 1 refill

## 2012-02-08 ENCOUNTER — Other Ambulatory Visit: Payer: Self-pay | Admitting: Internal Medicine

## 2012-02-09 ENCOUNTER — Other Ambulatory Visit: Payer: Self-pay

## 2012-02-09 MED ORDER — FUROSEMIDE 20 MG PO TABS: 20.0000 mg | ORAL_TABLET | Freq: Every day | ORAL | Status: DC

## 2012-03-11 ENCOUNTER — Other Ambulatory Visit: Payer: Self-pay | Admitting: Internal Medicine

## 2012-03-24 ENCOUNTER — Other Ambulatory Visit: Payer: Self-pay | Admitting: Internal Medicine

## 2012-04-23 ENCOUNTER — Other Ambulatory Visit: Payer: Self-pay | Admitting: Internal Medicine

## 2012-04-23 ENCOUNTER — Other Ambulatory Visit: Payer: Self-pay

## 2012-04-23 MED ORDER — HYDROCODONE-ACETAMINOPHEN 10-325 MG PO TABS: 1.0000 | ORAL_TABLET | Freq: Three times a day (TID) | ORAL | Status: DC | PRN

## 2012-04-27 ENCOUNTER — Other Ambulatory Visit: Payer: Self-pay

## 2012-06-21 ENCOUNTER — Telehealth: Payer: Self-pay | Admitting: Internal Medicine

## 2012-06-21 NOTE — Telephone Encounter (Signed)
Will await patient's return call.

## 2012-07-26 ENCOUNTER — Other Ambulatory Visit: Payer: Self-pay | Admitting: Endocrinology

## 2012-07-27 ENCOUNTER — Other Ambulatory Visit: Payer: Self-pay | Admitting: *Deleted

## 2012-07-27 MED ORDER — INSULIN GLARGINE 100 UNIT/ML ~~LOC~~ SOLN: 30.0000 [IU] | Freq: Every day | SUBCUTANEOUS | Status: DC

## 2012-08-22 ENCOUNTER — Other Ambulatory Visit: Payer: Self-pay | Admitting: Endocrinology

## 2012-08-27 ENCOUNTER — Other Ambulatory Visit: Payer: Self-pay | Admitting: *Deleted

## 2012-08-27 MED ORDER — LIRAGLUTIDE 18 MG/3ML ~~LOC~~ SOPN: 1.8000 mg | PEN_INJECTOR | Freq: Every day | SUBCUTANEOUS | Status: DC

## 2012-09-10 ENCOUNTER — Other Ambulatory Visit: Payer: Self-pay | Admitting: *Deleted

## 2012-09-10 DIAGNOSIS — E785 Hyperlipidemia, unspecified: Secondary | ICD-10-CM

## 2012-09-10 DIAGNOSIS — IMO0001 Reserved for inherently not codable concepts without codable children: Secondary | ICD-10-CM

## 2012-09-10 DIAGNOSIS — I10 Essential (primary) hypertension: Secondary | ICD-10-CM

## 2012-09-13 ENCOUNTER — Other Ambulatory Visit (INDEPENDENT_AMBULATORY_CARE_PROVIDER_SITE_OTHER): Payer: BC Managed Care – PPO

## 2012-09-13 DIAGNOSIS — IMO0001 Reserved for inherently not codable concepts without codable children: Secondary | ICD-10-CM

## 2012-09-13 DIAGNOSIS — Z794 Long term (current) use of insulin: Secondary | ICD-10-CM

## 2012-09-13 DIAGNOSIS — E119 Type 2 diabetes mellitus without complications: Secondary | ICD-10-CM

## 2012-09-13 DIAGNOSIS — E785 Hyperlipidemia, unspecified: Secondary | ICD-10-CM

## 2012-09-13 LAB — URINALYSIS
Bilirubin Urine: NEGATIVE
Hgb urine dipstick: NEGATIVE
Ketones, ur: NEGATIVE
Total Protein, Urine: NEGATIVE
pH: 5.5 (ref 5.0–8.0)

## 2012-09-13 LAB — LIPID PANEL
Cholesterol: 166 mg/dL (ref 0–200)
HDL: 44.2 mg/dL (ref >39.00)
LDL Cholesterol: 85 mg/dL (ref 0–99)
VLDL: 37.2 mg/dL (ref 0.0–40.0)

## 2012-09-13 LAB — COMPREHENSIVE METABOLIC PANEL
ALT: 38 U/L — ABNORMAL HIGH (ref 0–35)
Albumin: 4.5 g/dL (ref 3.5–5.2)
Alkaline Phosphatase: 48 U/L (ref 39–117)
CO2: 27 mEq/L (ref 19–32)
GFR: 87.35 mL/min (ref >60.00)
Glucose, Bld: 127 mg/dL — ABNORMAL HIGH (ref 70–99)
Potassium: 3.6 mEq/L (ref 3.5–5.1)
Sodium: 138 mEq/L (ref 135–145)
Total Bilirubin: 0.7 mg/dL (ref 0.3–1.2)
Total Protein: 7.8 g/dL (ref 6.0–8.3)

## 2012-09-13 LAB — MICROALBUMIN / CREATININE URINE RATIO: Microalb Creat Ratio: 0.4 mg/g (ref 0.0–30.0)

## 2012-09-13 LAB — HEMOGLOBIN A1C: Hgb A1c MFr Bld: 6.6 % — ABNORMAL HIGH (ref 4.6–6.5)

## 2012-09-19 ENCOUNTER — Ambulatory Visit (INDEPENDENT_AMBULATORY_CARE_PROVIDER_SITE_OTHER): Payer: BC Managed Care – PPO | Admitting: Endocrinology

## 2012-09-19 ENCOUNTER — Encounter: Payer: Self-pay | Admitting: Endocrinology

## 2012-09-19 VITALS — BP 118/64 | HR 87 | Temp 98.3°F | Resp 12 | Ht 65.0 in | Wt 227.4 lb

## 2012-09-19 DIAGNOSIS — IMO0001 Reserved for inherently not codable concepts without codable children: Secondary | ICD-10-CM

## 2012-09-19 DIAGNOSIS — R252 Cramp and spasm: Secondary | ICD-10-CM

## 2012-09-19 DIAGNOSIS — I10 Essential (primary) hypertension: Secondary | ICD-10-CM

## 2012-09-19 NOTE — Progress Notes (Signed)
Patient ID: Melissa Benson, female   DOB: 09/04/1960, 52 y.o.   MRN: [id number]  Melissa Benson is an 52 y.o. female.   Reason for Appointment: Diabetes follow-up   History of Present Illness   Diagnosis: Type 2 DIABETES MELITUS, date of diagnosis: 2009      Previous history: She has been on various oral hypoglycemic drugs in the past and was on relatively large doses of basal insulin. With starting Victoza in 4/13 she has drastically cut down her insulin requirement from her previous dosage of 80 units and has lost weight progressively also  Recent history: Her fasting readings are relatively higher compared to last time and has upper normal readings after supper She thinks this may be because of some difficulties with compliance with her Victoza and Lantus consistently in the evening. However most of her morning readings are higher than before Her overall glucose control is not as good as the last time and not clear if this is indicating some progression of her diabetes She has lost weight compared to 3 months ago and is trying to exercise     Oral hypoglycemic drugs: Metformin      Side effects from medications:  she may get diarrhea with taking metformin for those in the morning Insulin regimen: 12 Lantus in the evening         Proper timing of medications in relation to meals: Yes.          Monitors blood glucose: Once a day.    Glucometer: One Touch.          Blood Glucose readings from meter download: readings before breakfast:  Hypoglycemia frequency:  none        Meals: 3 meals per day.          Physical activity: exercise: Walking in the mornings           Dietician visit:  last 2009         Complications: are: None    The last HbgA1c was reported as 6.4    Wt Readings from Last 3 Encounters:  09/19/12 227 lb 6.4 oz (103.148 kg)  08/19/11 231 lb (104.781 kg)  07/11/11 234 lb (106.142 kg)    LABS:  Appointment on 09/13/2012  Component Date Value Range Status  .  Hemoglobin A1C 09/13/2012 6.6* 4.6 - 6.5 % Final   Glycemic Control Guidelines for People with Diabetes:Non Diabetic:  <6%Goal of Therapy: <7%Additional Action Suggested:  >8%   . Sodium 09/13/2012 138  135 - 145 mEq/L Final  . Potassium 09/13/2012 3.6  3.5 - 5.1 mEq/L Final  . Chloride 09/13/2012 103  96 - 112 mEq/L Final  . CO2 09/13/2012 27  19 - 32 mEq/L Final  . Glucose, Bld 09/13/2012 127* 70 - 99 mg/dL Final  . BUN 40/98/1191 13  6 - 23 mg/dL Final  . Creatinine, Ser 09/13/2012 0.7  0.4 - 1.2 mg/dL Final  . Total Bilirubin 09/13/2012 0.7  0.3 - 1.2 mg/dL Final  . Alkaline Phosphatase 09/13/2012 48  39 - 117 U/L Final  . AST 09/13/2012 41* 0 - 37 U/L Final  . ALT 09/13/2012 38* 0 - 35 U/L Final  . Total Protein 09/13/2012 7.8  6.0 - 8.3 g/dL Final  . Albumin 47/82/9562 4.5  3.5 - 5.2 g/dL Final  . Calcium 13/08/6576 9.7  8.4 - 10.5 mg/dL Final  . GFR 46/96/2952 87.35  >60.00 mL/min Final  . Color, Urine 09/13/2012 LT. YELLOW  Yellow;Lt. Yellow Final  . APPearance 09/13/2012 CLEAR  Clear Final  . Specific Gravity, Urine 09/13/2012 >=1.030  1.000 - 1.030 Final  . pH 09/13/2012 5.5  5.0 - 8.0 Final  . Total Protein, Urine 09/13/2012 NEGATIVE  Negative Final  . Urine Glucose 09/13/2012 NEGATIVE  Negative Final  . Ketones, ur 09/13/2012 NEGATIVE  Negative Final  . Bilirubin Urine 09/13/2012 NEGATIVE  Negative Final  . Hgb urine dipstick 09/13/2012 NEGATIVE  Negative Final  . Urobilinogen, UA 09/13/2012 0.2  0.0 - 1.0 Final  . Leukocytes, UA 09/13/2012 NEGATIVE  Negative Final  . Nitrite 09/13/2012 NEGATIVE  Negative Final  . Microalb, Ur 09/13/2012 0.9  0.0 - 1.9 mg/dL Final  . Creatinine,U 16/10/9602 253.1   Final  . Microalb Creat Ratio 09/13/2012 0.4  0.0 - 30.0 mg/g Final  . Cholesterol 09/13/2012 166  0 - 200 mg/dL Final   ATP III Classification       Desirable:  < 200 mg/dL               Borderline High:  200 - 239 mg/dL          High:  > = 540 mg/dL  . Triglycerides  09/13/2012 186.0* 0.0 - 149.0 mg/dL Final   Normal:  <981 mg/dLBorderline High:  150 - 199 mg/dL  . HDL 09/13/2012 44.20  >39.00 mg/dL Final  . VLDL 19/14/7829 37.2  0.0 - 40.0 mg/dL Final  . LDL Cholesterol 09/13/2012 85  0 - 99 mg/dL Final  . Total CHOL/HDL Ratio 09/13/2012 4   Final                  Men          Women1/2 Average Risk     3.4          3.3Average Risk          5.0          4.42X Average Risk          9.6          7.13X Average Risk          15.0          11.0                          Medication List       This list is accurate as of: 09/19/12  8:20 AM.  Always use your most recent med list.               ACCU-CHEK SMARTVIEW test strip  Generic drug:  glucose blood     amLODipine 5 MG tablet  Commonly known as:  NORVASC  Take 5 mg by mouth daily.     B-D ULTRAFINE III SHORT PEN 31G X 8 MM Misc  Generic drug:  Insulin Pen Needle  USE AS DIRECTED     cyclobenzaprine 10 MG tablet  Commonly known as:  FLEXERIL  TAKE 1 TABLET BY MOUTH THREE TIMES DAILY AS NEEDED MUSCLE SPASMS     furosemide 20 MG tablet  Commonly known as:  LASIX  Take 1 tablet (20 mg total) by mouth daily.     glipiZIDE 10 MG tablet  Commonly known as:  GLUCOTROL  Take 10 mg by mouth 2 (two) times daily before a meal.     HYDROcodone-acetaminophen 10-650 MG per tablet  Commonly known as:  LORCET  One po q 12 hours as  needed for pain     HYDROcodone-acetaminophen 10-325 MG per tablet  Commonly known as:  NORCO  Take 1 tablet by mouth every 8 (eight) hours as needed for pain.     LANTUS SOLOSTAR 100 UNIT/ML Sopn  Generic drug:  Insulin Glargine  INJECT 30 UNITS INTO THE SKIN DAILY     Liraglutide 18 MG/3ML Sopn  Commonly known as:  VICTOZA  Inject 1.8 mg into the skin daily.     metFORMIN 1000 MG tablet  Commonly known as:  GLUCOPHAGE  Take 1,000 mg by mouth 2 (two) times daily with a meal.     promethazine 25 MG tablet  Commonly known as:  PHENERGAN  TAKE 1 TABLET BY MOUTH  EVERY 6 HOURS AS NEEDED FOR NAUSEA     ramipril 10 MG capsule  Commonly known as:  ALTACE  TAKE ONE CAPSULE BY MOUTH DAILY     rosuvastatin 10 MG tablet  Commonly known as:  CRESTOR  Take 1 tablet (10 mg total) by mouth daily.     Vitamin D (Ergocalciferol) 50000 UNITS Caps capsule  Commonly known as:  DRISDOL        Allergies: No Known Allergies  Past Medical History  Diagnosis Date  . Hypertension   . Rosacea   . Psoriasis   . Diabetes mellitus   . Sleep apnea   . History of migraine headaches   . Neck pain, chronic     Past Surgical History  Procedure Laterality Date  . Cholecystectomy    . Appendectomy    . Tubal ligation    . Abdominal hysterectomy    . Spine surgery      C6-C7 fusion x2  . Spine surgery      herniated disc L4-L5    No family history on file.  Social History:  reports that she quit smoking about 29 years ago. Her smoking use included Cigarettes. She has a 20 pack-year smoking history. She has never used smokeless tobacco. She reports that she does not drink alcohol. Her drug history is not on file.  Review of Systems:  Hypertension:  currently being treated with ramipril and amlodipine  Lipids: She had not taken Crestor because she thinks it was causing muscle aches, off for the last month although previously had taken it for quite some time  She has had a history of leg edema in the past and will sometimes take Lasix, does not do this regularly since she gets cramps. Asking about continuing this  Taking Vitamin D weekly      Examination:   BP 118/64  Pulse 87  Temp(Src) 98.3 F (36.8 C)  Resp 12  Ht 5\' 5"  (1.651 m)  Wt 227 lb 6.4 oz (103.148 kg)  BMI 37.84 kg/m2  SpO2 98%  Body mass index is 37.84 kg/(m^2).    ASSESSMENT/ PLAN::   Diabetes type 2   Blood glucose control is not as good as the last time and not clear if this is indicating some progression of her diabetes She has lost weight compared to 3 months ago and is  trying to exercise Has had some difficulties with compliance with her Victoza and Lantus. Her fasting readings are relatively higher compared to last time and has upper normal readings after supper  Discussed titrating her Lantus to keep morning sugar under 120 as she is currently taking a small dose. She will also continue her 1.8 mg of Victoza and metformin  HYPERTENSION: Well controlled overall. Her blood  pressure is low normal and since she gets cramps with Lasix she can leave this off and use it only as needed for edema. Probably will have less edema now since she has lost weight  Hyperlipidemia: She has borderline lipids and has been given Crestor for cardiovascular protection because of her strong family history, likely also has diabetic dyslipidemia. She agrees to try Crestor 5 mg every other day but if she has muscle aches she can try pravastatin instead  Caresse Sedivy 09/19/2012, 8:20 AM

## 2012-09-19 NOTE — Patient Instructions (Addendum)
14 Lantus to keep am sugar <120   Crestor 1/2 every 2 days  Leave off Lasix

## 2012-09-23 ENCOUNTER — Other Ambulatory Visit: Payer: Self-pay | Admitting: Internal Medicine

## 2012-09-23 ENCOUNTER — Other Ambulatory Visit: Payer: Self-pay | Admitting: Endocrinology

## 2012-09-26 ENCOUNTER — Encounter: Payer: Self-pay | Admitting: Cardiovascular Disease

## 2012-09-26 ENCOUNTER — Ambulatory Visit (INDEPENDENT_AMBULATORY_CARE_PROVIDER_SITE_OTHER): Payer: BC Managed Care – PPO | Admitting: Cardiovascular Disease

## 2012-09-26 VITALS — BP 124/78 | HR 87 | Ht 65.0 in | Wt 224.0 lb

## 2012-09-26 DIAGNOSIS — I10 Essential (primary) hypertension: Secondary | ICD-10-CM

## 2012-09-26 DIAGNOSIS — E785 Hyperlipidemia, unspecified: Secondary | ICD-10-CM | POA: Insufficient documentation

## 2012-09-26 NOTE — Assessment & Plan Note (Signed)
On statin therapy followed by her PCP 

## 2012-09-26 NOTE — Patient Instructions (Addendum)
Follow up with Dr Berry as needed.  

## 2012-09-26 NOTE — Progress Notes (Signed)
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 She was referred through the courtesy of Dr. Lenord Fellers for evaluation of sinus tachycardia. Both of her parents were patients of Dr. Caprice Kluver here in our practice.   Her cardiovascular risk factor profile is positive for remote tobacco abuse, having quit 28 years ago, treated hypertension, hyperlipidemia and diabetes. Her father did have stents placed in the past. She has never had a heart attack or stroke and denies chest pain. She does get some dyspnea on exertion. She also has obstructive sleep apnea on CPAP. Her past surgical history is remarkable for C-spine surgery, hysterectomy, cholecystectomy and appendectomy. She does get some mid back pain. She saw Dr. Lenord Fellers in the office for routine exam and was found to be in sinus tachycardia with a heart rate of approximately 120.she was sent over for cardiology evaluation. 2-D echo and Myoview stress tests were normal. She's had no recurrent symptoms. Her most recent lipid profile performed 09/13/12 revealed a total cholesterol of 166, LDL 85 HDL of 44    Current Outpatient Prescriptions  Medication Sig Dispense Refill  . ACCU-CHEK SMARTVIEW test strip       . amLODipine (NORVASC) 5 MG tablet Take 5 mg by mouth daily.        . B-D ULTRAFINE III SHORT PEN 31G X 8 MM MISC USE AS DIRECTED  100 each  PRN  . CRESTOR 10 MG tablet TAKE 1 TABLET BY MOUTH EVERY DAY  30 tablet  3  . cyclobenzaprine (FLEXERIL) 10 MG tablet TAKE 1 TABLET BY MOUTH THREE TIMES DAILY AS NEEDED MUSCLE SPASMS  30 tablet  11  . furosemide (LASIX) 20 MG tablet Take 20 mg by mouth as needed.      Marland Kitchen HYDROcodone-acetaminophen  (NORCO) 10-325 MG per tablet Take 1 tablet by mouth every 8 (eight) hours as needed for pain.  60 tablet  2  . Insulin Glargine (LANTUS SOLOSTAR) 100 UNIT/ML SOPN       . Liraglutide (VICTOZA) 18 MG/3ML SOPN Inject 1.8 mg into the skin daily.  2 pen  5  . metFORMIN (GLUCOPHAGE) 1000 MG tablet Take 1,000 mg by mouth 2 (two) times daily with a meal.        . NON FORMULARY CPAP      . promethazine (PHENERGAN) 25 MG tablet TAKE 1 TABLET BY MOUTH EVERY 6 HOURS AS NEEDED FOR NAUSEA  30 tablet  0  . ramipril (ALTACE) 10 MG capsule TAKE ONE CAPSULE BY MOUTH DAILY  30 capsule  PRN  . Vitamin D, Ergocalciferol, (DRISDOL) 50000 UNITS CAPS        No current facility-administered medications for this visit.    No Known Allergies  History   Social History  . Marital Status: Married    Spouse Name: N/A    Number of Children: N/A  . Years of Education: N/A   Occupational History  . Not on file.   Social History Main Topics  . Smoking status: Former Smoker -- 2.50 packs/day for 8 years    Types: Cigarettes    Quit date: 05/18/1983  . Smokeless tobacco: Never Used  . Alcohol Use: No  . Drug Use: Not on  file  . Sexual Activity: Not on file   Other Topics Concern  . Not on file   Social History Narrative  . No narrative on file     Review of Systems: General: negative for chills, fever, night sweats or weight changes.  Cardiovascular: negative for chest pain, dyspnea on exertion, edema, orthopnea, palpitations, paroxysmal nocturnal dyspnea or shortness of breath Dermatological: negative for rash Respiratory: negative for cough or wheezing Urologic: negative for hematuria Abdominal: negative for nausea, vomiting, diarrhea, bright red blood per rectum, melena, or hematemesis Neurologic: negative for visual changes, syncope, or dizziness All other systems reviewed and are otherwise negative except as noted above.    Blood pressure 124/78, pulse 87, height 5\' 5"  (1.651 m), weight 224 lb  (101.606 kg).  General appearance: alert Neck: no adenopathy, no carotid bruit, no JVD, supple, symmetrical, trachea midline and thyroid not enlarged, symmetric, no tenderness/mass/nodules Lungs: clear to auscultation bilaterally Heart: regular rate and rhythm, S1, S2 normal, no murmur, click, rub or gallop Extremities: extremities normal, atraumatic, no cyanosis or edema  EKG normal sinus rhythm at 85 without ST or T wave changes  ASSESSMENT AND PLAN:   Hypertension Well-controlled on current medications  Hyperlipidemia On statin therapy followed by her PCP.      Runell Gess MD FACP,FACC,FAHA, [facility] 09/26/2012 12:03 PM

## 2012-09-26 NOTE — Assessment & Plan Note (Signed)
Well-controlled on current medications 

## 2012-09-27 ENCOUNTER — Other Ambulatory Visit: Payer: Self-pay | Admitting: *Deleted

## 2012-09-27 MED ORDER — GLUCOSE BLOOD VI STRP: ORAL_STRIP | Status: DC

## 2012-09-27 MED ORDER — AMLODIPINE BESYLATE 5 MG PO TABS: 5.0000 mg | ORAL_TABLET | Freq: Every day | ORAL | Status: DC

## 2012-09-28 ENCOUNTER — Encounter: Payer: Self-pay | Admitting: Cardiovascular Disease

## 2012-10-01 ENCOUNTER — Telehealth: Payer: Self-pay | Admitting: Endocrinology

## 2012-10-01 ENCOUNTER — Other Ambulatory Visit: Payer: Self-pay | Admitting: *Deleted

## 2012-10-01 MED ORDER — LIRAGLUTIDE 18 MG/3ML ~~LOC~~ SOPN: 1.8000 mg | PEN_INJECTOR | Freq: Every day | SUBCUTANEOUS | Status: DC

## 2012-10-12 ENCOUNTER — Other Ambulatory Visit: Payer: Self-pay | Admitting: Internal Medicine

## 2012-10-12 ENCOUNTER — Other Ambulatory Visit: Payer: BC Managed Care – PPO | Admitting: Internal Medicine

## 2012-10-12 DIAGNOSIS — Z1329 Encounter for screening for other suspected endocrine disorder: Secondary | ICD-10-CM

## 2012-10-12 DIAGNOSIS — E119 Type 2 diabetes mellitus without complications: Secondary | ICD-10-CM

## 2012-10-12 DIAGNOSIS — Z13220 Encounter for screening for lipoid disorders: Secondary | ICD-10-CM

## 2012-10-12 DIAGNOSIS — I10 Essential (primary) hypertension: Secondary | ICD-10-CM

## 2012-10-12 DIAGNOSIS — Z130 Encounter for screening for diseases of the blood and blood-forming organs and certain disorders involving the immune mechanism: Secondary | ICD-10-CM

## 2012-10-12 LAB — CBC WITH DIFFERENTIAL/PLATELET
Eosinophils Absolute: 0.1 10*3/uL (ref 0.0–0.7)
Hemoglobin: 12.7 g/dL (ref 12.0–15.0)
Lymphocytes Relative: 26 % (ref 12–46)
Lymphs Abs: 1.6 10*3/uL (ref 0.7–4.0)
MCH: 29.3 pg (ref 26.0–34.0)
Monocytes Absolute: 0.4 10*3/uL (ref 0.1–1.0)
Monocytes Relative: 6 % (ref 3–12)
Neutro Abs: 4.2 10*3/uL (ref 1.7–7.7)
Neutrophils Relative %: 67 % (ref 43–77)
RBC: 4.33 MIL/uL (ref 3.87–5.11)
WBC: 6.2 10*3/uL (ref 4.0–10.5)

## 2012-10-12 LAB — LIPID PANEL
Cholesterol: 123 mg/dL (ref 0–200)
HDL: 46 mg/dL (ref >39)
Total CHOL/HDL Ratio: 2.7 Ratio
Triglycerides: 151 mg/dL — ABNORMAL HIGH (ref <150)
VLDL: 30 mg/dL (ref 0–40)

## 2012-10-12 LAB — COMPREHENSIVE METABOLIC PANEL
AST: 42 U/L — ABNORMAL HIGH (ref 0–37)
Albumin: 4.5 g/dL (ref 3.5–5.2)
BUN: 12 mg/dL (ref 6–23)
Calcium: 9.7 mg/dL (ref 8.4–10.5)
Chloride: 102 mEq/L (ref 96–112)
Glucose, Bld: 161 mg/dL — ABNORMAL HIGH (ref 70–99)
Potassium: 4.1 mEq/L (ref 3.5–5.3)
Sodium: 140 mEq/L (ref 135–145)
Total Protein: 7.1 g/dL (ref 6.0–8.3)

## 2012-10-12 LAB — TSH: TSH: 0.912 u[IU]/mL (ref 0.350–4.500)

## 2012-10-13 LAB — VITAMIN D 25 HYDROXY (VIT D DEFICIENCY, FRACTURES): Vit D, 25-Hydroxy: 45 ng/mL (ref 30–89)

## 2012-10-15 ENCOUNTER — Ambulatory Visit (INDEPENDENT_AMBULATORY_CARE_PROVIDER_SITE_OTHER): Payer: BC Managed Care – PPO | Admitting: Internal Medicine

## 2012-10-15 ENCOUNTER — Encounter: Payer: Self-pay | Admitting: Internal Medicine

## 2012-10-15 VITALS — BP 132/84 | HR 60 | Temp 99.1°F | Ht 60.0 in | Wt 223.0 lb

## 2012-10-15 DIAGNOSIS — E669 Obesity, unspecified: Secondary | ICD-10-CM

## 2012-10-15 DIAGNOSIS — R209 Unspecified disturbances of skin sensation: Secondary | ICD-10-CM

## 2012-10-15 DIAGNOSIS — G4733 Obstructive sleep apnea (adult) (pediatric): Secondary | ICD-10-CM

## 2012-10-15 DIAGNOSIS — Z8669 Personal history of other diseases of the nervous system and sense organs: Secondary | ICD-10-CM

## 2012-10-15 DIAGNOSIS — Z0000 Encounter for general adult medical examination without abnormal findings: Secondary | ICD-10-CM

## 2012-10-15 DIAGNOSIS — E119 Type 2 diabetes mellitus without complications: Secondary | ICD-10-CM

## 2012-10-15 DIAGNOSIS — Z23 Encounter for immunization: Secondary | ICD-10-CM

## 2012-10-15 DIAGNOSIS — E785 Hyperlipidemia, unspecified: Secondary | ICD-10-CM

## 2012-10-15 DIAGNOSIS — G609 Hereditary and idiopathic neuropathy, unspecified: Secondary | ICD-10-CM

## 2012-10-15 DIAGNOSIS — R202 Paresthesia of skin: Secondary | ICD-10-CM

## 2012-10-15 DIAGNOSIS — R200 Anesthesia of skin: Secondary | ICD-10-CM

## 2012-10-15 DIAGNOSIS — I10 Essential (primary) hypertension: Secondary | ICD-10-CM

## 2012-10-15 LAB — POCT URINALYSIS DIPSTICK
Blood, UA: NEGATIVE
Glucose, UA: NEGATIVE
Nitrite, UA: NEGATIVE
Protein, UA: NEGATIVE
Urobilinogen, UA: NEGATIVE

## 2012-10-15 LAB — VITAMIN B12: Vitamin B-12: 508 pg/mL (ref 211–911)

## 2012-10-15 LAB — HM MAMMOGRAPHY

## 2012-10-15 NOTE — Patient Instructions (Signed)
B12 level will be checked. Please have nerve conduction studies at Neurologist. Return in 4 weeks. Call [facility] regarding C-Pap reorder issues.

## 2012-10-15 NOTE — Progress Notes (Signed)
Subjective:    Patient ID: Melissa Benson, female    DOB: 01-Apr-1960, 52 y.o.   MRN: [id number]  HPI   52 year old white female with history of diabetes mellitus treated with multiple medications including Victoza, Lantus insulin, metformin in for health maintenance and evaluation of medical problems. She has a history of hypertension, hyperlipidemia, chronic neck pain, history of migraine headaches, history of sleep apnea. Also has history of obesity. History of depression.  Dr. Lucianne Muss takes care of her diabetes mellitus. Recently has developed some numbness and tingling in her feet particularly her toes which is a new complaint. Will check B12 level. Have asked her to have nerve conduction studies.  Says she needs reevaluation of her CPAP apparatus. Apparently sleep apnea was initially diagnosed through [facility] and have asked her to contact them regarding this.  Past medical history: Appendectomy 1985, cholecystectomy 1986, bilateral tubal ligation 1984. Hysterectomy with one ovary removed in 2003. Exploratory surgery for GYN issues 3 times. Surgery for ruptured cervical disc 2004 in 2005. 2 pregnancies and no miscarriages.   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  Family history: Father with history of diabetes. Mother with history of heart disease. 3 brothers all of which have substance abuse problems. 2 sisters one of them is a diabetic.  Had colonoscopy by Dr. Ewing Schlein  09/10/2012 showing no adenomatous polyps were hyperplastic polyps.  Has lost 11 pounds since July 2013.    Review of Systems  Constitutional: Negative.   HENT: Negative.   Eyes: Negative.        Has had recent diabetic eye exam  Respiratory:       History of sleep apnea  Gastrointestinal: Negative.   Endocrine:       Diabetes controlled with insulin and Victoza  Genitourinary: Negative.   Neurological:       Chronic neck pain  Hematological: Negative.    Psychiatric/Behavioral:       History of depression but that seems to be stable       Objective:   Physical Exam  Vitals reviewed. Constitutional: She is oriented to person, place, and time. She appears well-developed and well-nourished. No distress.  HENT:  Head: Normocephalic and atraumatic.  Right Ear: External ear normal.  Left Ear: External ear normal.  Nose: Nose normal.  Mouth/Throat: Oropharynx is clear and moist. No oropharyngeal exudate.  Eyes: Conjunctivae and EOM are normal. Pupils are equal, round, and reactive to light. Right eye exhibits no discharge. Left eye exhibits no discharge. No scleral icterus.  Neck: Neck supple. No JVD present. No thyromegaly present.  Cardiovascular: Normal rate, regular rhythm, normal heart sounds and intact distal pulses.   Pulmonary/Chest: Breath sounds normal. No respiratory distress. She has no wheezes. She has no rales. She exhibits no tenderness.  Breasts normal female  Abdominal: Soft. Bowel sounds are normal. She exhibits no distension and no mass. There is no tenderness. There is no rebound and no guarding.  Genitourinary:  deferred  Musculoskeletal: Normal range of motion. She exhibits no edema.  Lymphadenopathy:    She has no cervical adenopathy.  Neurological: She is alert and oriented to person, place, and time. She has normal reflexes. She displays normal reflexes. No cranial nerve deficit. Coordination normal.  Sensation intact in feet  Skin: Skin is warm and dry. She is not diaphoretic.  Psychiatric: She has a normal mood and affect. Her behavior is normal. Judgment and thought content normal.  Assessment & Plan:  Controlled type 2 diabetes. Stable on Lantus and Victoza. 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 Check B12 level. Have nerve conduction studies and reassess in 4 weeks

## 2012-10-25 ENCOUNTER — Telehealth: Payer: Self-pay | Admitting: Internal Medicine

## 2012-10-25 DIAGNOSIS — N39498 Other specified urinary incontinence: Secondary | ICD-10-CM

## 2012-10-25 MED ORDER — TOLTERODINE TARTRATE 2 MG PO TABS: 2.0000 mg | ORAL_TABLET | Freq: Two times a day (BID) | ORAL | Status: DC

## 2012-10-25 NOTE — Telephone Encounter (Signed)
Wants medication for stress urinary incontinence. Calling generic Detrol 2 mg twice daily with refills.

## 2012-10-26 ENCOUNTER — Other Ambulatory Visit: Payer: Self-pay | Admitting: Endocrinology

## 2012-10-29 ENCOUNTER — Encounter: Payer: Self-pay | Admitting: Neurology

## 2012-10-29 ENCOUNTER — Ambulatory Visit (INDEPENDENT_AMBULATORY_CARE_PROVIDER_SITE_OTHER): Payer: BC Managed Care – PPO | Admitting: Neurology

## 2012-10-29 DIAGNOSIS — M542 Cervicalgia: Secondary | ICD-10-CM

## 2012-10-29 DIAGNOSIS — G8929 Other chronic pain: Secondary | ICD-10-CM

## 2012-10-29 DIAGNOSIS — R209 Unspecified disturbances of skin sensation: Secondary | ICD-10-CM

## 2012-10-29 DIAGNOSIS — M79609 Pain in unspecified limb: Secondary | ICD-10-CM

## 2012-10-29 NOTE — Procedures (Addendum)
[facility]  [address] [place], [address]  [place], [place] [address] Tel: [phone] Fax:  [phone] Test Date:  10/29/2012  Patient: Melissa Benson DOB: 09-Jul-1960 Physician: Nita Sickle, DO  Sex: Female Height: 5\' 6"  Ref Phys: Margaree Mackintosh  ID#: [id number] Temp: 32.1C Technician:    Patient Complaints: This is a 52 year-old female presenting with 49-month history of hands and feet paresthesias.  NCV & EMG Findings: Extensive evaluation of the right upper and lower extremities reveals the following: 1. Normal median, ulnar and radial sensory responses. 2. Normal median and ulnar motor response recorded at the abductor pollicis brevis and abductor digiti minimi, respectively. 3. Normal sural and superficial peroneal sensory responses. Medial plantar sensory response is absent and may be a normal finding in a patient of this age. 4. The tibial and peroneal motor responses are normal at the abductor hallucis and extensor digitorum brevis, respectively. 5. Very mild chronic motor axonal loss changes are seen in L5-myotomes, without evidence of active denervation.  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    There is no evidence of a cervical motor radiculopathy, median neuropathy at the wrist, or large fiber generalized sensorimotor polyneuropathy affecting the right side. However, a pure small fiber sensory polyneuropathy cannot be excluded based on this study.     ___________________________ Nita Sickle, DO    Nerve Conduction Studies Anti Sensory Summary Table   Site NR Peak (ms) Norm Peak (ms) P-T Amp (V) Norm P-T Amp  Right Median Anti Sensory (2nd Digit)  Wrist    3.1 <3.6 35.8 >15  Right Radial Anti Sensory (Base 1st Digit)  Wrist    2.0 <2.7 31.5 >14  Right Sup Peroneal Anti Sensory (Ant Lat Mall)  12 cm    2.7 <4.6 10.2 >4    Right Sural Anti Sensory (Lat Mall)  Calf    3.4 <4.6 10.9 >4  Right Ulnar Anti Sensory (5th Digit)  Wrist    2.7 <3.1 32.5 >10   Motor Summary Table   Site NR Onset (ms) Norm Onset (ms) O-P Amp (mV) Norm O-P Amp Site1 Site2 Delta-0 (ms) Dist (cm) Vel (m/s) Norm Vel (m/s)  Right Median Motor (Abd Poll Brev)  Wrist    3.4 <4.0 9.4 >6 Elbow Wrist 4.9 29.0 59 >50  Elbow    8.3  8.2         Right Peroneal Motor (Ext Dig Brev)  Ankle    3.8 <6.0 5.0 >2.5 B Fib Ankle 6.4 32.0 50 >40  B Fib    10.2  4.7  Poplt B Fib 1.8 10.0 56 >40  Poplt    12.0  4.7         Right Tibial Motor (Abd Hall Brev)  Ankle    4.5 <6.0 8.2 >4 Knee Ankle 8.9 36.0 40 >40  Knee    13.4  7.2         Right Ulnar Motor (Abd Dig Minimi)  Wrist    2.5 <3.1 12.9 >7 B Elbow Wrist 3.2 19.0 59 >50  B Elbow    5.7  11.4  A Elbow B Elbow 2.0 11.0 55 >50  A Elbow    7.7  10.9          Mixed Summary Table   Site NR Peak (ms) Norm Peak (ms) P-T Amp (V) Norm P-T Amp  Right Medial Plantar Mixed (  Med Malleolus)  Medial Foot NR  <3.7  >8   H Reflex Studies   NR H-Lat (ms) Lat Norm (ms) L-R H-Lat (ms)  Left Tibial (Gastroc)     35.27 <35 0.10  Right Tibial (Gastroc)     35.37 <35 0.10   EMG   Side Muscle Ins Act Fibs Psw Fasc Number Recrt Dur Dur. Amp Amp. Poly Poly. Comment  Right AntTibialis Nml Nml Nml Nml 1- Mod Few 1+ Nml Nml Few 1+ N/A  Right Gastroc Nml Nml Nml Nml 1- Mod-V Nml Nml Nml Nml Nml Nml N/A  Right Flex Dig Long Nml Nml Nml Nml 1- Mod Few 1+ Nml Nml Few 1+ N/A  Right RectFemoris Nml Nml Nml Nml Nml Nml Nml Nml Nml Nml Nml Nml N/A  Right GluteusMed Nml Nml Nml Nml 1- Mod Few 1+ Nml Nml Nml Nml N/A  Right 1stDorInt Nml Nml Nml Nml Nml Nml Nml Nml Nml Nml Nml Nml N/A  Right Ext Indicis Nml Nml Nml Nml Nml Nml Nml Nml Nml Nml Nml Nml N/A  Right FlexPolLong Nml Nml Nml Nml Nml Nml Nml Nml Nml Nml Nml Nml N/A  Right PronatorTeres Nml Nml Nml Nml Nml Nml Nml Nml Nml Nml Nml Nml N/A  Right Biceps Nml Nml Nml  Nml Nml Nml Nml Nml Nml Nml Nml Nml N/A  Right Triceps Nml Nml Nml Nml Nml Nml Nml Nml Nml Nml Nml Nml N/A  Right Deltoid Nml Nml Nml Nml Nml Nml Few 1+ Nml Nml Nml Nml N/A     Waveforms:

## 2012-10-29 NOTE — Progress Notes (Signed)
See procedure note for EMG results.  Gianella Chismar K. Hendrix Yurkovich, DO  

## 2012-10-29 NOTE — Progress Notes (Signed)
Call pt. Nerve conduction essentially normal.

## 2012-11-25 ENCOUNTER — Other Ambulatory Visit: Payer: Self-pay | Admitting: Endocrinology

## 2012-12-17 ENCOUNTER — Other Ambulatory Visit: Payer: BC Managed Care – PPO

## 2012-12-19 ENCOUNTER — Ambulatory Visit: Payer: BC Managed Care – PPO | Admitting: Endocrinology

## 2012-12-25 ENCOUNTER — Other Ambulatory Visit: Payer: Self-pay | Admitting: *Deleted

## 2012-12-25 MED ORDER — METFORMIN HCL 1000 MG PO TABS: ORAL_TABLET | ORAL | Status: DC

## 2013-01-04 ENCOUNTER — Other Ambulatory Visit: Payer: Self-pay | Admitting: Endocrinology

## 2013-01-04 ENCOUNTER — Other Ambulatory Visit: Payer: Self-pay | Admitting: Internal Medicine

## 2013-01-04 ENCOUNTER — Other Ambulatory Visit: Payer: Self-pay | Admitting: *Deleted

## 2013-01-04 NOTE — Telephone Encounter (Signed)
Please advise refill? 

## 2013-01-06 ENCOUNTER — Other Ambulatory Visit: Payer: Self-pay | Admitting: Endocrinology

## 2013-01-06 MED ORDER — VITAMIN D (ERGOCALCIFEROL) 1.25 MG (50000 UNIT) PO CAPS: 50000.0000 [IU] | ORAL_CAPSULE | ORAL | Status: DC

## 2013-01-07 ENCOUNTER — Other Ambulatory Visit: Payer: Self-pay | Admitting: *Deleted

## 2013-01-07 MED ORDER — INSULIN GLARGINE 100 UNIT/ML SOLOSTAR PEN: 14.0000 [IU] | PEN_INJECTOR | Freq: Every day | SUBCUTANEOUS | Status: DC

## 2013-01-23 ENCOUNTER — Other Ambulatory Visit: Payer: BC Managed Care – PPO

## 2013-01-24 ENCOUNTER — Encounter: Payer: Self-pay | Admitting: *Deleted

## 2013-01-24 ENCOUNTER — Other Ambulatory Visit: Payer: Self-pay | Admitting: *Deleted

## 2013-01-24 ENCOUNTER — Other Ambulatory Visit (INDEPENDENT_AMBULATORY_CARE_PROVIDER_SITE_OTHER): Payer: BC Managed Care – PPO

## 2013-01-24 DIAGNOSIS — E785 Hyperlipidemia, unspecified: Secondary | ICD-10-CM

## 2013-01-24 DIAGNOSIS — IMO0001 Reserved for inherently not codable concepts without codable children: Secondary | ICD-10-CM

## 2013-01-24 DIAGNOSIS — E1165 Type 2 diabetes mellitus with hyperglycemia: Principal | ICD-10-CM

## 2013-01-24 LAB — URINALYSIS
Bilirubin Urine: NEGATIVE
Hgb urine dipstick: NEGATIVE
Ketones, ur: NEGATIVE
LEUKOCYTES UA: NEGATIVE
NITRITE: NEGATIVE
Total Protein, Urine: NEGATIVE
URINE GLUCOSE: NEGATIVE
UROBILINOGEN UA: 0.2 (ref 0.0–1.0)
pH: 5.5 (ref 5.0–8.0)

## 2013-01-24 LAB — COMPREHENSIVE METABOLIC PANEL
ALT: 29 U/L (ref 0–35)
AST: 33 U/L (ref 0–37)
Albumin: 4.1 g/dL (ref 3.5–5.2)
Alkaline Phosphatase: 46 U/L (ref 39–117)
BUN: 16 mg/dL (ref 6–23)
CO2: 25 meq/L (ref 19–32)
CREATININE: 0.8 mg/dL (ref 0.4–1.2)
Calcium: 9.6 mg/dL (ref 8.4–10.5)
Chloride: 107 mEq/L (ref 96–112)
GFR: 85.88 mL/min (ref >60.00)
Glucose, Bld: 119 mg/dL — ABNORMAL HIGH (ref 70–99)
Potassium: 4.1 mEq/L (ref 3.5–5.1)
Sodium: 141 mEq/L (ref 135–145)
Total Bilirubin: 0.5 mg/dL (ref 0.3–1.2)
Total Protein: 7.5 g/dL (ref 6.0–8.3)

## 2013-01-24 LAB — LIPID PANEL
Cholesterol: 99 mg/dL (ref 0–200)
HDL: 41.9 mg/dL (ref >39.00)
LDL CALC: 44 mg/dL (ref 0–99)
TRIGLYCERIDES: 65 mg/dL (ref 0.0–149.0)
Total CHOL/HDL Ratio: 2
VLDL: 13 mg/dL (ref 0.0–40.0)

## 2013-01-24 LAB — HEMOGLOBIN A1C: Hgb A1c MFr Bld: 6.2 % (ref 4.6–6.5)

## 2013-01-25 LAB — MICROALBUMIN / CREATININE URINE RATIO
CREATININE, U: 194.6 mg/dL
Microalb Creat Ratio: 0.5 mg/g (ref 0.0–30.0)

## 2013-01-30 ENCOUNTER — Encounter: Payer: Self-pay | Admitting: Endocrinology

## 2013-01-30 ENCOUNTER — Ambulatory Visit (INDEPENDENT_AMBULATORY_CARE_PROVIDER_SITE_OTHER): Payer: BC Managed Care – PPO | Admitting: Endocrinology

## 2013-01-30 VITALS — BP 118/68 | HR 94 | Temp 98.3°F | Resp 14 | Ht 66.25 in | Wt 222.1 lb

## 2013-01-30 DIAGNOSIS — E785 Hyperlipidemia, unspecified: Secondary | ICD-10-CM

## 2013-01-30 DIAGNOSIS — E119 Type 2 diabetes mellitus without complications: Secondary | ICD-10-CM

## 2013-01-30 DIAGNOSIS — I10 Essential (primary) hypertension: Secondary | ICD-10-CM

## 2013-01-30 MED ORDER — METFORMIN HCL ER 500 MG PO TB24: 2000.0000 mg | ORAL_TABLET | Freq: Every day | ORAL | Status: DC

## 2013-01-30 MED ORDER — INSULIN GLARGINE 100 UNIT/ML SOLOSTAR PEN: PEN_INJECTOR | SUBCUTANEOUS | Status: DC

## 2013-01-30 NOTE — Patient Instructions (Addendum)
Please check blood sugars at least half the time about 2 hours after any meal and as directed on waking up.  Blood sugars after meals should be at least under 180 Avoid fast food in the morning Adjust Lantus if blood sugars are consistently over 130 or below 90 Consistent exercise Please bring blood sugar monitor to each visit

## 2013-01-30 NOTE — Progress Notes (Signed)
Patient ID: Melissa Benson, female   DOB: 1960-09-13, 53 y.o.   MRN: [id number]   Reason for Appointment: Diabetes follow-up   History of Present Illness   Diagnosis: Type 2 DIABETES MELITUS, date of diagnosis: 2009      Previous history: She has been on various oral hypoglycemic drugs in the past and was on relatively large doses of basal insulin. With starting Victoza in 04/2011 she had drastically cut down her insulin requirement from her previous dosage of 80 units and had lost weight progressively also  Recent history: On her last visit the fasting readings were relatively higher since she had Reducing her insulin dose down to only 12 units on her own. Also had upper normal readings after supper Sometimes had occasionally forgotten her Lantus or Victoza in the evening On her last visit she was advised to titrate back the Lantus to keep morning sugars around 120 and she is back up to 30 units She is more compliant with her evening Lantus after supper However he is taking her blood sugar readings mostly before lunch and dinner other than postprandial Will have occasional high readings after breakfast from fast food biscuits Her A1c has however improved and she had lost a couple pounds also Compliance with medications: Will tend to forget the metformin in the morning, does better with extended release preparation once a day. Taking Lantus regularly Oral hypoglycemic drugs: Metformin 2 g   Side effects from medications: she may get diarrhea with regular metformin in the morning Insulin regimen: 30 units Lantus in the evening               Monitors blood glucose: Once a day.    Glucometer: One Touch.          Blood Glucose readings from meter download:  PREMEAL Breakfast Lunch Dinner Bedtime Overall  Glucose range:  112-139   86-103   79-143       Mean/median:  132   95   97   103   POST-MEAL PC Breakfast PC Lunch PC Dinner  Glucose range: 145- 230  105, 164  Mean/median:       Hypoglycemia frequency:  none        Meals: 3 meals per day.           Physical activity: exercise: Walking in the mornings           Dietician visit:  last 2009         Complications: are: None     Wt Readings from Last 3 Encounters:  01/30/13 222 lb 1.6 oz (100.744 kg)  10/15/12 223 lb (101.152 kg)  09/26/12 224 lb (101.606 kg)    LABS:  Lab Results  Component Value Date   HGBA1C 6.2 01/24/2013   HGBA1C 6.5* 10/12/2012   HGBA1C 6.6* 09/13/2012   Lab Results  Component Value Date   MICROALBUR 1.0 Repeated and verified X2. 01/24/2013   LDLCALC 44 01/24/2013   CREATININE 0.8 01/24/2013    Appointment on 01/24/2013  Component Date Value Range Status  . Hemoglobin A1C 01/24/2013 6.2  4.6 - 6.5 % Final   Glycemic Control Guidelines for People with Diabetes:Non Diabetic:  <6%Goal of Therapy: <7%Additional Action Suggested:  >8%   . Sodium 01/24/2013 141  135 - 145 mEq/L Final  . Potassium 01/24/2013 4.1  3.5 - 5.1 mEq/L Final  . Chloride 01/24/2013 107  96 - 112 mEq/L Final  . CO2 01/24/2013 25  19 - 32 mEq/L  Final  . Glucose, Bld 01/24/2013 119* 70 - 99 mg/dL Final  . BUN 16/10/9602 16  6 - 23 mg/dL Final  . Creatinine, Ser 01/24/2013 0.8  0.4 - 1.2 mg/dL Final  . Total Bilirubin 01/24/2013 0.5  0.3 - 1.2 mg/dL Final  . Alkaline Phosphatase 01/24/2013 46  39 - 117 U/L Final  . AST 01/24/2013 33  0 - 37 U/L Final  . ALT 01/24/2013 29  0 - 35 U/L Final  . Total Protein 01/24/2013 7.5  6.0 - 8.3 g/dL Final  . Albumin 54/09/8117 4.1  3.5 - 5.2 g/dL Final  . Calcium 14/78/2956 9.6  8.4 - 10.5 mg/dL Final  . GFR 21/30/8657 85.88  >60.00 mL/min Final  . Color, Urine 01/24/2013 YELLOW  Yellow;Lt. Yellow Final  . APPearance 01/24/2013 CLEAR  Clear Final  . Specific Gravity, Urine 01/24/2013 >=1.030* 1.000 - 1.030 Final  . pH 01/24/2013 5.5  5.0 - 8.0 Final  . Total Protein, Urine 01/24/2013 NEGATIVE  Negative Final  . Urine Glucose 01/24/2013 NEGATIVE  Negative Final  . Ketones,  ur 01/24/2013 NEGATIVE  Negative Final  . Bilirubin Urine 01/24/2013 NEGATIVE  Negative Final  . Hgb urine dipstick 01/24/2013 NEGATIVE  Negative Final  . Urobilinogen, UA 01/24/2013 0.2  0.0 - 1.0 Final  . Leukocytes, UA 01/24/2013 NEGATIVE  Negative Final  . Nitrite 01/24/2013 NEGATIVE  Negative Final  . Microalb, Ur 01/24/2013 1.0 Repeated and verified X2.  0.0 - 1.9 mg/dL Final   Verified by manual dilution.  . Creatinine,U 01/24/2013 194.6   Final  . Microalb Creat Ratio 01/24/2013 0.5  0.0 - 30.0 mg/g Final  . Cholesterol 01/24/2013 99  0 - 200 mg/dL Final   ATP III Classification       Desirable:  < 200 mg/dL               Borderline High:  200 - 239 mg/dL          High:  > = 846 mg/dL  . Triglycerides 01/24/2013 65.0  0.0 - 149.0 mg/dL Final   Normal:  <962 mg/dLBorderline High:  150 - 199 mg/dL  . HDL 01/24/2013 41.90  >39.00 mg/dL Final  . VLDL 95/28/4132 13.0  0.0 - 40.0 mg/dL Final  . LDL Cholesterol 01/24/2013 44  0 - 99 mg/dL Final  . Total CHOL/HDL Ratio 01/24/2013 2   Final                  Men          Women1/2 Average Risk     3.4          3.3Average Risk          5.0          4.42X Average Risk          9.6          7.13X Average Risk          15.0          11.0                          Medication List       This list is accurate as of: 01/30/13  9:48 AM.  Always use your most recent med list.               amLODipine 5 MG tablet  Commonly known as:  NORVASC  Take 1 tablet (5  mg total) by mouth daily.     B-D ULTRAFINE III SHORT PEN 31G X 8 MM Misc  Generic drug:  Insulin Pen Needle  USE AS DIRECTED     CRESTOR 10 MG tablet  Generic drug:  rosuvastatin  TAKE HALF TABLET BY MOUTH EVERY DAY (PER PATIENT)     cyclobenzaprine 10 MG tablet  Commonly known as:  FLEXERIL  TAKE 1 TABLET BY MOUTH THREE TIMES DAILY AS NEEDED MUSCLE SPASMS     furosemide 20 MG tablet  Commonly known as:  LASIX  Take 20 mg by mouth as needed.     glucose blood test strip   Commonly known as:  ONE TOUCH ULTRA TEST  Use as instructed to check blood sugars 4 times per day  Dx code 250.00     Insulin Glargine 100 UNIT/ML Solostar Pen  Commonly known as:  LANTUS SOLOSTAR  INJECT 30 UNITS INTO THE SKIN EVERY DAY     Liraglutide 18 MG/3ML Sopn  Commonly known as:  VICTOZA  Inject 1.8 mg into the skin daily.     metFORMIN 500 MG 24 hr tablet  Commonly known as:  GLUCOPHAGE-XR  Take 4 tablets (2,000 mg total) by mouth daily with supper.     NON FORMULARY  CPAP     promethazine 25 MG tablet  Commonly known as:  PHENERGAN  TAKE 1 TABLET BY MOUTH EVERY 6 HOURS AS NEEDED FOR NAUSEA     ramipril 10 MG capsule  Commonly known as:  ALTACE  TAKE ONE CAPSULE BY MOUTH DAILY     Vitamin D (Ergocalciferol) 50000 UNITS Caps capsule  Commonly known as:  DRISDOL  Take 1 capsule (50,000 Units total) by mouth every 7 (seven) days.        Allergies: No Known Allergies  Past Medical History  Diagnosis Date  . Hypertension   . Rosacea   . Psoriasis   . Diabetes mellitus   . OSA (obstructive sleep apnea)     on CPAP  . History of migraine headaches   . Neck pain, chronic   . Hyperlipidemia     Past Surgical History  Procedure Laterality Date  . Cholecystectomy    . Appendectomy    . Tubal ligation    . Abdominal hysterectomy    . Spine surgery      C6-C7 fusion x2  . Spine surgery      herniated disc L4-L5  . Cardiovascular stress test  07/20/2011    Normal myocardial perfusion study, EKG negative for ischemia, no ECG changes  . 2d echocardiogram  07/20/2011    EF >55%, normal    Family History  Problem Relation Age of Onset  . Diabetes Mother   . Heart disease Mother   . Hypertension Mother   . Lung disease Father   . Heart disease Father   . Hypertension Father   . Diabetes Father   . Hypertension Sister   . Hyperlipidemia Brother   . Hypertension Brother   . Heart attack Maternal Grandmother   . Heart disease Maternal Grandmother   .  Hypertension Maternal Grandfather   . Diabetes Maternal Grandfather   . Heart disease Maternal Grandfather     Social History:  reports that she quit smoking about 29 years ago. Her smoking use included Cigarettes. She has a 20 pack-year smoking history. She has never used smokeless tobacco. She reports that she does not drink alcohol. Her drug history is not on file.  Review of  Systems:  Hypertension:  currently being treated with ramipril and amlodipine with good control  Lipids: She has gone back on Crestor for cardiovascular protection but takes it about every other day to minimize muscle aches  Lab Results  Component Value Date   CHOL 99 01/24/2013   HDL 41.90 01/24/2013   LDLCALC 44 01/24/2013   TRIG 65.0 01/24/2013   CHOLHDL 2 01/24/2013    She has had a history of leg edema in the past and currently not using Lasix because of cramps  Taking Vitamin D weekly as a supplement      Examination:   BP 118/68  Pulse 94  Temp(Src) 98.3 F (36.8 C)  Resp 14  Ht 5' 6.25" (1.683 m)  Wt 222 lb 1.6 oz (100.744 kg)  BMI 35.57 kg/m2  SpO2 97%  Body mass index is 35.57 kg/(m^2).    ASSESSMENT/ PLAN::   Diabetes type 2   Blood glucose control is better than the last time with titrating up her Lantus insulin She appears to have fairly good readings overnight although may still have a Dawn phenomenon Since her A1c is upper normal will not need to increase her insulin any further She does need to monitor postprandial readings rather than before meals She does need to watch her diet consistently especially avoiding fast food in the morning Overall is doing well with keeping her weight down and exercising She will also continue her 1.8 mg of Victoza and metformin She will switch to metformin ER for better compliance instead of metformin Discussed day-to-day management of diet, exercise, medications and insulin  HYPERTENSION: Well controlled.  Hyperlipidemia: She will continue  low-dose Crestor for cardiovascular protection    Akisha Sturgill 01/30/2013, 9:48 AM

## 2013-03-13 ENCOUNTER — Other Ambulatory Visit: Payer: Self-pay | Admitting: *Deleted

## 2013-03-13 ENCOUNTER — Telehealth: Payer: Self-pay | Admitting: *Deleted

## 2013-03-13 NOTE — Telephone Encounter (Signed)
Called patient to find out which medication it was as it was not mentioned in the telephone note, waiting for patient to return call

## 2013-03-14 ENCOUNTER — Other Ambulatory Visit: Payer: Self-pay | Admitting: *Deleted

## 2013-03-14 MED ORDER — INSULIN GLARGINE 100 UNIT/ML SOLOSTAR PEN: PEN_INJECTOR | SUBCUTANEOUS | Status: DC

## 2013-04-05 ENCOUNTER — Other Ambulatory Visit: Payer: Self-pay | Admitting: Internal Medicine

## 2013-04-05 ENCOUNTER — Other Ambulatory Visit: Payer: Self-pay | Admitting: Endocrinology

## 2013-04-08 NOTE — Telephone Encounter (Signed)
May need appt. Please check before refilling.

## 2013-04-09 ENCOUNTER — Other Ambulatory Visit: Payer: Self-pay

## 2013-04-09 MED ORDER — RAMIPRIL 10 MG PO CAPS: 10.0000 mg | ORAL_CAPSULE | Freq: Every day | ORAL | Status: DC

## 2013-04-09 MED ORDER — CYCLOBENZAPRINE HCL 10 MG PO TABS: 10.0000 mg | ORAL_TABLET | Freq: Three times a day (TID) | ORAL | Status: DC | PRN

## 2013-04-09 MED ORDER — ROSUVASTATIN CALCIUM 10 MG PO TABS: 10.0000 mg | ORAL_TABLET | Freq: Every day | ORAL | Status: DC

## 2013-04-23 ENCOUNTER — Encounter: Payer: Self-pay | Admitting: Internal Medicine

## 2013-04-23 ENCOUNTER — Ambulatory Visit (INDEPENDENT_AMBULATORY_CARE_PROVIDER_SITE_OTHER): Payer: BC Managed Care – PPO | Admitting: Internal Medicine

## 2013-04-23 VITALS — BP 138/82 | HR 84 | Temp 98.9°F | Wt 225.0 lb

## 2013-04-23 DIAGNOSIS — H8110 Benign paroxysmal vertigo, unspecified ear: Secondary | ICD-10-CM

## 2013-04-23 DIAGNOSIS — E669 Obesity, unspecified: Secondary | ICD-10-CM

## 2013-04-23 DIAGNOSIS — I10 Essential (primary) hypertension: Secondary | ICD-10-CM

## 2013-04-23 DIAGNOSIS — G8929 Other chronic pain: Secondary | ICD-10-CM

## 2013-04-23 DIAGNOSIS — E119 Type 2 diabetes mellitus without complications: Secondary | ICD-10-CM

## 2013-04-23 DIAGNOSIS — M542 Cervicalgia: Secondary | ICD-10-CM

## 2013-04-23 DIAGNOSIS — Z8669 Personal history of other diseases of the nervous system and sense organs: Secondary | ICD-10-CM

## 2013-04-23 DIAGNOSIS — Z87898 Personal history of other specified conditions: Secondary | ICD-10-CM

## 2013-04-23 DIAGNOSIS — E785 Hyperlipidemia, unspecified: Secondary | ICD-10-CM

## 2013-04-23 MED ORDER — HYDROCODONE-ACETAMINOPHEN 10-325 MG PO TABS: 1.0000 | ORAL_TABLET | Freq: Three times a day (TID) | ORAL | Status: DC | PRN

## 2013-04-23 MED ORDER — MECLIZINE HCL 25 MG PO TABS: 25.0000 mg | ORAL_TABLET | Freq: Three times a day (TID) | ORAL | Status: DC | PRN
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
## 2013-04-23 NOTE — Patient Instructions (Addendum)
Return after Oct 13 for CPE. Take Meclizine for vertigo. Take hydrocodone/APAP sparingly for neck pain

## 2013-05-30 ENCOUNTER — Other Ambulatory Visit (INDEPENDENT_AMBULATORY_CARE_PROVIDER_SITE_OTHER): Payer: BC Managed Care – PPO

## 2013-05-30 DIAGNOSIS — E119 Type 2 diabetes mellitus without complications: Secondary | ICD-10-CM

## 2013-05-30 LAB — BASIC METABOLIC PANEL
BUN: 16 mg/dL (ref 6–23)
CALCIUM: 9.5 mg/dL (ref 8.4–10.5)
CO2: 26 mEq/L (ref 19–32)
Chloride: 105 mEq/L (ref 96–112)
Creatinine, Ser: 0.8 mg/dL (ref 0.4–1.2)
GFR: 84.47 mL/min (ref >60.00)
Glucose, Bld: 108 mg/dL — ABNORMAL HIGH (ref 70–99)
Potassium: 3.8 mEq/L (ref 3.5–5.1)
SODIUM: 140 meq/L (ref 135–145)

## 2013-05-30 LAB — HEMOGLOBIN A1C: Hgb A1c MFr Bld: 6.6 % — ABNORMAL HIGH (ref 4.6–6.5)

## 2013-06-04 ENCOUNTER — Encounter: Payer: Self-pay | Admitting: Endocrinology

## 2013-06-04 ENCOUNTER — Ambulatory Visit (INDEPENDENT_AMBULATORY_CARE_PROVIDER_SITE_OTHER): Payer: BC Managed Care – PPO | Admitting: Endocrinology

## 2013-06-04 VITALS — BP 138/80 | HR 88 | Temp 98.0°F | Resp 16 | Ht 66.25 in | Wt 229.6 lb

## 2013-06-04 DIAGNOSIS — IMO0001 Reserved for inherently not codable concepts without codable children: Secondary | ICD-10-CM

## 2013-06-04 DIAGNOSIS — E1165 Type 2 diabetes mellitus with hyperglycemia: Principal | ICD-10-CM

## 2013-06-04 MED ORDER — CANAGLIFLOZIN 300 MG PO TABS: 300.0000 mg | ORAL_TABLET | Freq: Every day | ORAL | Status: DC

## 2013-06-04 NOTE — Progress Notes (Signed)
Patient ID: Melissa Benson, female   DOB: Mar 19, 1960, 53 y.o.   MRN: [id number]   Reason for Appointment: Diabetes follow-up   History of Present Illness   Diagnosis: Type 2 DIABETES MELITUS, date of diagnosis: 2009      Previous history: She has been on various oral hypoglycemic drugs in the past and was on relatively large doses of basal insulin. With starting Victoza in 04/2011 she had drastically cut down her insulin requirement from her previous dosage of 80 units and had lost weight progressively also  Recent history: On her last visit her insulin regimen was continued unchanged as her control is fairly good Also to help her with compliance and tolerability she was switched from metformin to metformin ER She is more compliant with her evening Lantus after supper Not clear why her weight has started going up and also fasting readings are relatively higher She does not get enough satiety with taking 1.8 mg Victoza now and tends to get hungry in between meals more, no nausea with this Usually trying to be compliant with diet, however will have fruits at bedtime for snacks  Oral hypoglycemic drugs: Metformin ER 2 g   Side effects from medications: she may get diarrhea with regular metformin in the morning Insulin regimen: 28 units Lantus in the evening               Monitors blood glucose: Once a day.    Glucometer: One Touch.          Blood Glucose readings from meter download:  PREMEAL Breakfast Lunch Dinner  PCS  Overall  Glucose range:  110-184   88-121   100, 109   96-173   Mean/median:  142      124    Hypoglycemia frequency:  none        Meals: 3 meals per day. Some salads at lunch          Physical activity: exercise: Walking irregularly in the mornings, was having some vertigo           Dietician visit:  last 2009         Complications: are: None     Wt Readings from Last 3 Encounters:  06/04/13 229 lb 9.6 oz (104.146 kg)  04/23/13 225 lb (102.059 kg)  01/30/13 222  lb 1.6 oz (100.744 kg)    LABS:  Lab Results  Component Value Date   HGBA1C 6.6* 05/30/2013   HGBA1C 6.2 01/24/2013   HGBA1C 6.5* 10/12/2012   Lab Results  Component Value Date   MICROALBUR 1.0 Repeated and verified X2. 01/24/2013   LDLCALC 44 01/24/2013   CREATININE 0.8 05/30/2013    Appointment on 05/30/2013  Component Date Value Ref Range Status  . Hemoglobin A1C 05/30/2013 6.6* 4.6 - 6.5 % Final   Glycemic Control Guidelines for People with Diabetes:Non Diabetic:  <6%Goal of Therapy: <7%Additional Action Suggested:  >8%   . Sodium 05/30/2013 140  135 - 145 mEq/L Final  . Potassium 05/30/2013 3.8  3.5 - 5.1 mEq/L Final  . Chloride 05/30/2013 105  96 - 112 mEq/L Final  . CO2 05/30/2013 26  19 - 32 mEq/L Final  . Glucose, Bld 05/30/2013 108* 70 - 99 mg/dL Final  . BUN 40/98/1191 16  6 - 23 mg/dL Final  . Creatinine, Ser 05/30/2013 0.8  0.4 - 1.2 mg/dL Final  . Calcium 47/82/9562 9.5  8.4 - 10.5 mg/dL Final  . GFR 13/08/6576 84.47  >60.00 mL/min Final  Medication List       This list is accurate as of: 06/04/13  8:26 AM.  Always use your most recent med list.               amLODipine 5 MG tablet  Commonly known as:  NORVASC  TAKE 1 TABLET BY MOUTH DAILY     B-D ULTRAFINE III SHORT PEN 31G X 8 MM Misc  Generic drug:  Insulin Pen Needle  USE AS DIRECTED     cyclobenzaprine 10 MG tablet  Commonly known as:  FLEXERIL  Take 1 tablet (10 mg total) by mouth 3 (three) times daily as needed for muscle spasms.     furosemide 20 MG tablet  Commonly known as:  LASIX  Take 20 mg by mouth as needed.     glucose blood test strip  Commonly known as:  ONE TOUCH ULTRA TEST  Use as instructed to check blood sugars 4 times per day  Dx code 250.00     HYDROcodone-acetaminophen 10-325 MG per tablet  Commonly known as:  NORCO  Take 1 tablet by mouth every 8 (eight) hours as needed.     Insulin Glargine 100 UNIT/ML Solostar Pen  Commonly known as:  LANTUS SOLOSTAR  INJECT 50  UNITS INTO THE SKIN EVERY DAY     meclizine 25 MG tablet  Commonly known as:  ANTIVERT  Take 1 tablet (25 mg total) by mouth 3 (three) times daily as needed.     metFORMIN 500 MG 24 hr tablet  Commonly known as:  GLUCOPHAGE-XR  Take 4 tablets (2,000 mg total) by mouth daily with supper.     NON FORMULARY  CPAP     promethazine 25 MG tablet  Commonly known as:  PHENERGAN  TAKE 1 TABLET BY MOUTH EVERY 6 HOURS AS NEEDED FOR NAUSEA     ramipril 10 MG capsule  Commonly known as:  ALTACE  Take 1 capsule (10 mg total) by mouth daily.     rosuvastatin 10 MG tablet  Commonly known as:  CRESTOR  Take 1 tablet (10 mg total) by mouth daily.     VICTOZA 18 MG/3ML Sopn  Generic drug:  Liraglutide  INJECT 1.8 MG INTO THE SKIN EVERY DAY     Vitamin D (Ergocalciferol) 50000 UNITS Caps capsule  Commonly known as:  DRISDOL  Take 1 capsule (50,000 Units total) by mouth every 7 (seven) days.        Allergies: No Known Allergies  Past Medical History  Diagnosis Date  . Hypertension   . Rosacea   . Psoriasis   . Diabetes mellitus   . OSA (obstructive sleep apnea)     on CPAP  . History of migraine headaches   . Neck pain, chronic   . Hyperlipidemia     Past Surgical History  Procedure Laterality Date  . Cholecystectomy    . Appendectomy    . Tubal ligation    . Abdominal hysterectomy    . Spine surgery      C6-C7 fusion x2  . Spine surgery      herniated disc L4-L5  . Cardiovascular stress test  07/20/2011    Normal myocardial perfusion study, EKG negative for ischemia, no ECG changes  . 2d echocardiogram  07/20/2011    EF >55%, normal    Family History  Problem Relation Age of Onset  . Diabetes Mother   . Heart disease Mother   . Hypertension Mother   . Lung disease Father   .  Heart disease Father   . Hypertension Father   . Diabetes Father   . Hypertension Sister   . Hyperlipidemia Brother   . Hypertension Brother   . Heart attack Maternal Grandmother   .  Heart disease Maternal Grandmother   . Hypertension Maternal Grandfather   . Diabetes Maternal Grandfather   . Heart disease Maternal Grandfather     Social History:  reports that she quit smoking about 30 years ago. Her smoking use included Cigarettes. She has a 20 pack-year smoking history. She has never used smokeless tobacco. She reports that she does not drink alcohol. Her drug history is not on file.  Review of Systems:  Hypertension:  currently being treated with ramipril and amlodipine with good control  Lipids: She is on Crestor for cardiovascular protection but takes it about every other day to minimize muscle aches  Lab Results  Component Value Date   CHOL 99 01/24/2013   HDL 41.90 01/24/2013   LDLCALC 44 01/24/2013   TRIG 65.0 01/24/2013   CHOLHDL 2 01/24/2013    She has had a history of leg edema in the past and currently not using Lasix because of cramps  Taking Vitamin D weekly as a supplement      Examination:   BP 138/80  Pulse 88  Temp(Src) 98 F (36.7 C)  Resp 16  Ht 5' 6.25" (1.683 m)  Wt 229 lb 9.6 oz (104.146 kg)  BMI 36.77 kg/m2  SpO2 96%  Body mass index is 36.77 kg/(m^2).    ASSESSMENT/ PLAN:   Diabetes type 2   Blood glucose control is somewhat worse recently although A1c is still near normal Despite 1.8 mg Victoza and better compliance with metformin ER she is still having difficulty controlling her weight which is increasing Also blood sugars appear to be relatively higher in the morning, usually good later in the day Most of the time she is watching her diet for content Her insurance plan does not cover any weight loss medications and does not cover the 3.0 mg Victoza formulation  Recommendations today:  Increase exercise and do more regularly  Avoid fruit at bedtime and have a protein snack  Increase Lantus to 30 units  Trial of Invokana. Discussed in detail how this works for blood sugar control, benefits, possible side effects and  management of candidiasis. She will take this in the morning before breakfast and discussed the dosage. Information package and co-pay card given.  She may need to start reducing her insulin if her blood sugars improved significantly with Invokana  Discussed weight loss bariatric surgery and I do feel that she is a good candidate for this  HYPERTENSION: Well controlled.  Hyperlipidemia: She will continue low-dose Crestor for cardiovascular protection  Counseling time over 50% of today's 25 minute visit  Reather Littler 06/04/2013, 8:26 AM

## 2013-06-04 NOTE — Patient Instructions (Addendum)
Lantus 30 units, may reduce dose if am sugar <90  Have a protein snack at bedtime  Check on Saxenda  Invokana 300mg , 1/2 for 5 days then 1 before Bfst  Walk daily

## 2013-06-08 ENCOUNTER — Other Ambulatory Visit: Payer: Self-pay | Admitting: Endocrinology

## 2013-06-18 ENCOUNTER — Encounter: Payer: Self-pay | Admitting: Internal Medicine

## 2013-06-18 ENCOUNTER — Ambulatory Visit (INDEPENDENT_AMBULATORY_CARE_PROVIDER_SITE_OTHER): Payer: BC Managed Care – PPO | Admitting: Internal Medicine

## 2013-06-18 VITALS — BP 122/66 | HR 80 | Temp 99.2°F | Wt 223.0 lb

## 2013-06-18 DIAGNOSIS — T148 Other injury of unspecified body region: Secondary | ICD-10-CM

## 2013-06-18 DIAGNOSIS — W57XXXA Bitten or stung by nonvenomous insect and other nonvenomous arthropods, initial encounter: Secondary | ICD-10-CM

## 2013-06-18 MED ORDER — HYDROCODONE-ACETAMINOPHEN 10-325 MG PO TABS: 1.0000 | ORAL_TABLET | Freq: Three times a day (TID) | ORAL | Status: DC | PRN

## 2013-06-18 MED ORDER — TRIAMCINOLONE ACETONIDE 0.1 % EX CREA: 1.0000 "application " | TOPICAL_CREAM | Freq: Three times a day (TID) | CUTANEOUS | Status: DC

## 2013-06-18 NOTE — Progress Notes (Signed)
   Subjective:    Patient ID: Melissa Benson, female    DOB: 1960/10/02, 53 y.o.   MRN: [id number]  HPI Patient discovered tick on her on Wednesday, June 10. She was able to get the tick removed but it's left an irritated red itchy place on her left lateral trunk area. She says she's had some low-grade fever but has been around people who weren't-year-old with viral-type syndromes including her granddaughter and a Radio broadcast assistantcoworker. She's had some headache. Last night she vomited once. Feels better today. No rash. No myalgias.    Review of Systems     Objective:   Physical Exam 2 cm linear erythematous area with central insect bite noted left trunk. No secondary infection.       Assessment & Plan:  Tick bite  Plan: At her request refill hydrocodone/APAP 10/325 #60 with no refill. Will hold off on treating her for possible RM as if. She will call if symptoms worsen or she does not improve. Triamcinolone cream 0.1% to use on tick bite area 3 times daily.

## 2013-06-18 NOTE — Patient Instructions (Signed)
Use triamcinolone cream to tick bite 3 times daily. Hydrocodone/APAP refilled.

## 2013-07-04 ENCOUNTER — Ambulatory Visit (INDEPENDENT_AMBULATORY_CARE_PROVIDER_SITE_OTHER): Payer: BC Managed Care – PPO | Admitting: Endocrinology

## 2013-07-04 ENCOUNTER — Encounter: Payer: Self-pay | Admitting: Endocrinology

## 2013-07-04 VITALS — BP 118/72 | HR 85 | Temp 99.0°F | Resp 12 | Wt 223.0 lb

## 2013-07-04 DIAGNOSIS — E1165 Type 2 diabetes mellitus with hyperglycemia: Principal | ICD-10-CM

## 2013-07-04 DIAGNOSIS — IMO0001 Reserved for inherently not codable concepts without codable children: Secondary | ICD-10-CM

## 2013-07-04 DIAGNOSIS — I10 Essential (primary) hypertension: Secondary | ICD-10-CM

## 2013-07-04 MED ORDER — FLUCONAZOLE 150 MG PO TABS: 150.0000 mg | ORAL_TABLET | Freq: Once | ORAL | Status: DC

## 2013-07-04 NOTE — Progress Notes (Signed)
Patient ID: Melissa Benson, female   DOB: 10-Nov-1960, 53 y.o.   MRN: [id number]   Reason for Appointment: Diabetes follow-up   History of Present Illness   Diagnosis: Type 2 DIABETES MELITUS, date of diagnosis: 2009      Previous history: She has been on various oral hypoglycemic drugs in the past and was on relatively large doses of basal insulin. With starting Victoza in 04/2011 she had drastically cut down her insulin requirement from her previous dosage of 80 units and had lost weight progressively also  Recent history: On her last visit she was started on Invokana to help her control and provide some weight loss which he was not achieving despite using 1.8 mg Victoza. Also was not getting enough satiety with taking 1.8 mg Victoza  Also to help her with compliance and tolerability she has been on metformin ER With her starting Invokana weight has started coming down again and she has been able to reduce her insulin dose by 12 units She is taking 300 mg. Only recently is having mild vaginal candidiasis symptoms and has not treated this Also her blood sugars are well lower and recently progressively improving in the mornings also She is  compliant with her evening Lantus after supper At the same time she is trying to walk regularly Usually trying to be compliant with diet, however will have fruits at bedtime for snacks  Oral hypoglycemic drugs: Metformin ER 2 g   Side effects from medications: she may get diarrhea with regular metformin in the morning Insulin regimen: 16 units Lantus in the evening               Monitors blood glucose: Once a day.    Glucometer: One Touch.          Blood Glucose readings from meter download:  PREMEAL Breakfast Lunch Dinner  late p.m.  Overall  Glucose range:  96- 141   85-107   94, 114   82-156    Mean/median:  116      103   Hypoglycemia frequency:  none        Meals: 3 meals per day. Some salads at lunch          Physical activity: exercise:  Walking 1 mile           Dietician visit:  last 2009         Complications: are: None     Wt Readings from Last 3 Encounters:  07/04/13 223 lb (101.152 kg)  06/18/13 223 lb (101.152 kg)  06/04/13 229 lb 9.6 oz (104.146 kg)    LABS:  Lab Results  Component Value Date   HGBA1C 6.6* 05/30/2013   HGBA1C 6.2 01/24/2013   HGBA1C 6.5* 10/12/2012   Lab Results  Component Value Date   MICROALBUR 1.0 Repeated and verified X2. 01/24/2013   LDLCALC 44 01/24/2013   CREATININE 0.8 05/30/2013    No visits with results within 1 Week(s) from this visit. Latest known visit with results is:  Appointment on 05/30/2013  Component Date Value Ref Range Status  . Hemoglobin A1C 05/30/2013 6.6* 4.6 - 6.5 % Final   Glycemic Control Guidelines for People with Diabetes:Non Diabetic:  <6%Goal of Therapy: <7%Additional Action Suggested:  >8%   . Sodium 05/30/2013 140  135 - 145 mEq/L Final  . Potassium 05/30/2013 3.8  3.5 - 5.1 mEq/L Final  . Chloride 05/30/2013 105  96 - 112 mEq/L Final  . CO2 05/30/2013 26  19 -  32 mEq/L Final  . Glucose, Bld 05/30/2013 108* 70 - 99 mg/dL Final  . BUN 16/10/9602 16  6 - 23 mg/dL Final  . Creatinine, Ser 05/30/2013 0.8  0.4 - 1.2 mg/dL Final  . Calcium 54/09/8117 9.5  8.4 - 10.5 mg/dL Final  . GFR 14/78/2956 84.47  >60.00 mL/min Final      Medication List       This list is accurate as of: 07/04/13 11:59 PM.  Always use your most recent med list.               amLODipine 5 MG tablet  Commonly known as:  NORVASC  TAKE 1 TABLET BY MOUTH DAILY     B-D ULTRAFINE III SHORT PEN 31G X 8 MM Misc  Generic drug:  Insulin Pen Needle  USE AS DIRECTED     Canagliflozin 300 MG Tabs  Commonly known as:  INVOKANA  Take 1 tablet (300 mg total) by mouth daily before breakfast.     cyclobenzaprine 10 MG tablet  Commonly known as:  FLEXERIL  Take 1 tablet (10 mg total) by mouth 3 (three) times daily as needed for muscle spasms.     fluconazole 150 MG tablet  Commonly  known as:  DIFLUCAN  Take 1 tablet (150 mg total) by mouth once.     furosemide 20 MG tablet  Commonly known as:  LASIX  Take 20 mg by mouth as needed.     glucose blood test strip  Commonly known as:  ONE TOUCH ULTRA TEST  Use as instructed to check blood sugars 4 times per day  Dx code 250.00     HYDROcodone-acetaminophen 10-325 MG per tablet  Commonly known as:  NORCO  Take 1 tablet by mouth every 8 (eight) hours as needed.     Insulin Glargine 100 UNIT/ML Solostar Pen  Commonly known as:  LANTUS SOLOSTAR  INJECT 50 UNITS INTO THE SKIN EVERY DAY     meclizine 25 MG tablet  Commonly known as:  ANTIVERT  Take 1 tablet (25 mg total) by mouth 3 (three) times daily as needed.     metFORMIN 500 MG 24 hr tablet  Commonly known as:  GLUCOPHAGE-XR  TAKE 4 TABLETS BY MOUTH EVERY DAY WITH SUPPER     NON FORMULARY  CPAP     promethazine 25 MG tablet  Commonly known as:  PHENERGAN  TAKE 1 TABLET BY MOUTH EVERY 6 HOURS AS NEEDED FOR NAUSEA     ramipril 10 MG capsule  Commonly known as:  ALTACE  Take 1 capsule (10 mg total) by mouth daily.     rosuvastatin 10 MG tablet  Commonly known as:  CRESTOR  Take 1 tablet (10 mg total) by mouth daily.     triamcinolone cream 0.1 %  Commonly known as:  KENALOG  Apply 1 application topically 3 (three) times daily.     VICTOZA 18 MG/3ML Sopn  Generic drug:  Liraglutide  INJECT 1.8MG  INTO SKIN EVERY DAY     Vitamin D (Ergocalciferol) 50000 UNITS Caps capsule  Commonly known as:  DRISDOL  Take 1 capsule (50,000 Units total) by mouth every 7 (seven) days.        Allergies: No Known Allergies  Past Medical History  Diagnosis Date  . Hypertension   . Rosacea   . Psoriasis   . Diabetes mellitus   . OSA (obstructive sleep apnea)     on CPAP  . History of migraine headaches   .  Neck pain, chronic   . Hyperlipidemia     Past Surgical History  Procedure Laterality Date  . Cholecystectomy    . Appendectomy    . Tubal ligation     . Abdominal hysterectomy    . Spine surgery      C6-C7 fusion x2  . Spine surgery      herniated disc L4-L5  . Cardiovascular stress test  07/20/2011    Normal myocardial perfusion study, EKG negative for ischemia, no ECG changes  . 2d echocardiogram  07/20/2011    EF >55%, normal    Family History  Problem Relation Age of Onset  . Diabetes Mother   . Heart disease Mother   . Hypertension Mother   . Lung disease Father   . Heart disease Father   . Hypertension Father   . Diabetes Father   . Hypertension Sister   . Hyperlipidemia Brother   . Hypertension Brother   . Heart attack Maternal Grandmother   . Heart disease Maternal Grandmother   . Hypertension Maternal Grandfather   . Diabetes Maternal Grandfather   . Heart disease Maternal Grandfather     Social History:  reports that she quit smoking about 30 years ago. Her smoking use included Cigarettes. She has a 20 pack-year smoking history. She has never used smokeless tobacco. She reports that she does not drink alcohol. Her drug history is not on file.  Review of Systems:  Hypertension:  currently being treated with ramipril and amlodipine with good control. Home BP 120/70-80  Lipids: She is on Crestor for cardiovascular protection but takes it about every other day to minimize muscle aches  Lab Results  Component Value Date   CHOL 99 01/24/2013   HDL 41.90 01/24/2013   LDLCALC 44 01/24/2013   TRIG 65.0 01/24/2013   CHOLHDL 2 01/24/2013    She has had a history of leg edema in the past and currently not using Lasix  Taking Vitamin D weekly as a supplement      Examination:   BP 118/72  Pulse 85  Temp(Src) 99 F (37.2 C) (Oral)  Resp 12  Wt 223 lb (101.152 kg)  SpO2 98%  Body mass index is 35.71 kg/(m^2).    ASSESSMENT/ PLAN:   Diabetes type 2   Blood glucose control is  overall improved, previously had a tendency to be having high readings and A1c as well as weight gain She has done very well with  Invokana and has only mild candidiasis recently with this Able to reduce her insulin dose by 12 units also Has not had any A1c or fructosamine checked recently  Discussed that she may be aware to gradually do up or down her insulin and potentially stop this Since her blood pressure is relatively low she can reduce her amlodipine  Recommendations today:  Patient Instructions  Lantus 12 units and if sugar still <110 in am reduce to 8  Amlodipine 1/2 daily  Diflucan 1x for yeast infection as needed       Albertus Chiarelli 07/05/2013, 3:13 PM

## 2013-07-04 NOTE — Patient Instructions (Addendum)
Lantus 12 units and if sugar still <110 in am reduce to 8  Amlodipine 1/2 daily  Diflucan 1x for yeast infection as needed

## 2013-07-12 ENCOUNTER — Ambulatory Visit (INDEPENDENT_AMBULATORY_CARE_PROVIDER_SITE_OTHER): Payer: BC Managed Care – PPO | Admitting: Emergency Medicine

## 2013-07-12 DIAGNOSIS — R21 Rash and other nonspecific skin eruption: Secondary | ICD-10-CM

## 2013-07-12 DIAGNOSIS — W57XXXA Bitten or stung by nonvenomous insect and other nonvenomous arthropods, initial encounter: Secondary | ICD-10-CM

## 2013-07-12 DIAGNOSIS — T148 Other injury of unspecified body region: Secondary | ICD-10-CM

## 2013-07-12 LAB — CBC WITH DIFFERENTIAL/PLATELET
BASOS ABS: 0 10*3/uL (ref 0.0–0.1)
BASOS PCT: 0 % (ref 0–1)
Eosinophils Absolute: 0.1 10*3/uL (ref 0.0–0.7)
Eosinophils Relative: 1 % (ref 0–5)
HCT: 38.6 % (ref 36.0–46.0)
Hemoglobin: 13.2 g/dL (ref 12.0–15.0)
Lymphocytes Relative: 31 % (ref 12–46)
Lymphs Abs: 1.7 10*3/uL (ref 0.7–4.0)
MCH: 28.8 pg (ref 26.0–34.0)
MCHC: 34.2 g/dL (ref 30.0–36.0)
MCV: 84.1 fL (ref 78.0–100.0)
Monocytes Absolute: 0.4 10*3/uL (ref 0.1–1.0)
Monocytes Relative: 7 % (ref 3–12)
NEUTROS ABS: 3.4 10*3/uL (ref 1.7–7.7)
NEUTROS PCT: 61 % (ref 43–77)
Platelets: 339 10*3/uL (ref 150–400)
RBC: 4.59 MIL/uL (ref 3.87–5.11)
RDW: 14.3 % (ref 11.5–15.5)
WBC: 5.5 10*3/uL (ref 4.0–10.5)

## 2013-07-12 MED ORDER — DOXYCYCLINE HYCLATE 100 MG PO CAPS: 100.0000 mg | ORAL_CAPSULE | Freq: Two times a day (BID) | ORAL | Status: DC

## 2013-07-12 NOTE — Progress Notes (Signed)
[facility] [address] [place] [address] [phone]  Date:  07/12/2013   Name:  Melissa Benson   DOB:  04-18-1960   MRN:  [id number]  PCP:  Margaree Mackintosh, MD    Chief Complaint: Rash   History of Present Illness:  Melissa Benson is a 53 y.o. very pleasant female patient who presents with the following:  Patient removed a tick from her left side in middle of June.  FMD put her on TAC.  Now has a purpuric rash started on her left lower leg prior a trip to the beach.  It is now involving both medial lower legs and ankles.  No pain, fever or chills. Some pruritis.  New meds include diflucan and invokana over past month.  No improvement with over the counter medications or other home remedies. Denies other complaint or health concern today.   Patient Active Problem List   Diagnosis Date Noted  . Obesity, unspecified 10/15/2012  . Hyperlipidemia 09/26/2012  . Chronic neck pain 12/04/2010  . History of migraine headaches 12/04/2010  . Type II or unspecified type diabetes mellitus without mention of complication, uncontrolled 06/03/2010  . Hypertension 06/03/2010  . Sleep apnea 06/03/2010    Past Medical History  Diagnosis Date  . Hypertension   . Rosacea   . Psoriasis   . Diabetes mellitus   . OSA (obstructive sleep apnea)     on CPAP  . History of migraine headaches   . Neck pain, chronic   . Hyperlipidemia     Past Surgical History  Procedure Laterality Date  . Cholecystectomy    . Appendectomy    . Tubal ligation    . Abdominal hysterectomy    . Spine surgery      C6-C7 fusion x2  . Spine surgery      herniated disc L4-L5  . Cardiovascular stress test  07/20/2011    Normal myocardial perfusion study, EKG negative for ischemia, no ECG changes  . 2d echocardiogram  07/20/2011    EF >55%, normal    History  Substance Use Topics  . Smoking status: Former Smoker -- 2.50 packs/day for 8 years    Types: Cigarettes    Quit date:  05/18/1983  . Smokeless tobacco: Never Used  . Alcohol Use: No    Family History  Problem Relation Age of Onset  . Diabetes Mother   . Heart disease Mother   . Hypertension Mother   . Lung disease Father   . Heart disease Father   . Hypertension Father   . Diabetes Father   . Hypertension Sister   . Hyperlipidemia Brother   . Hypertension Brother   . Heart attack Maternal Grandmother   . Heart disease Maternal Grandmother   . Hypertension Maternal Grandfather   . Diabetes Maternal Grandfather   . Heart disease Maternal Grandfather     No Known Allergies  Medication list has been reviewed and updated.  Current Outpatient Prescriptions on File Prior to Visit  Medication Sig Dispense Refill  . amLODipine (NORVASC) 5 MG tablet TAKE 1 TABLET BY MOUTH DAILY  30 tablet  3  . B-D ULTRAFINE III SHORT PEN 31G X 8 MM MISC USE AS DIRECTED  100 each  PRN  . Canagliflozin (INVOKANA) 300 MG TABS Take 1 tablet (300 mg total) by mouth daily before breakfast.  30 tablet  3  . cyclobenzaprine (FLEXERIL) 10 MG tablet Take 1 tablet (10 mg total) by mouth  3 (three) times daily as needed for muscle spasms.  90 tablet  1  . fluconazole (DIFLUCAN) 150 MG tablet Take 1 tablet (150 mg total) by mouth once.  1 tablet  1  . glucose blood (ONE TOUCH ULTRA TEST) test strip Use as instructed to check blood sugars 4 times per day  Dx code 250.00  150 each  5  . HYDROcodone-acetaminophen (NORCO) 10-325 MG per tablet Take 1 tablet by mouth every 8 (eight) hours as needed.  60 tablet  0  . Insulin Glargine (LANTUS SOLOSTAR) 100 UNIT/ML Solostar Pen INJECT 50 UNITS INTO THE SKIN EVERY DAY  15 mL  4  . metFORMIN (GLUCOPHAGE-XR) 500 MG 24 hr tablet TAKE 4 TABLETS BY MOUTH EVERY DAY WITH SUPPER  120 tablet  1  . NON FORMULARY CPAP      . ramipril (ALTACE) 10 MG capsule Take 1 capsule (10 mg total) by mouth daily.  90 capsule  1  . rosuvastatin (CRESTOR) 10 MG tablet Take 1 tablet (10 mg total) by mouth daily.  90  tablet  1  . Vitamin D, Ergocalciferol, (DRISDOL) 50000 UNITS CAPS capsule Take 1 capsule (50,000 Units total) by mouth every 7 (seven) days.  30 capsule  3  . furosemide (LASIX) 20 MG tablet Take 20 mg by mouth as needed.      . meclizine (ANTIVERT) 25 MG tablet Take 1 tablet (25 mg total) by mouth 3 (three) times daily as needed.  30 tablet  0  . promethazine (PHENERGAN) 25 MG tablet TAKE 1 TABLET BY MOUTH EVERY 6 HOURS AS NEEDED FOR NAUSEA  30 tablet  0  . triamcinolone cream (KENALOG) 0.1 % Apply 1 application topically 3 (three) times daily.  30 g  1  . VICTOZA 18 MG/3ML SOPN INJECT 1.8MG  INTO SKIN EVERY DAY  9 mL  1   No current facility-administered medications on file prior to visit.    Review of Systems:  As per HPI, otherwise negative.    Physical Examination: There were no vitals filed for this visit. There were no vitals filed for this visit. There is no weight on file to calculate BMI. Ideal Body Weight:     GEN: WDWN, NAD, Non-toxic, Alert & Oriented x 3 HEENT: Atraumatic, Normocephalic.  Ears and Nose: No external deformity. EXTR: No clubbing/cyanosis/edema NEURO: Normal gait.  PSYCH: Normally interactive. Conversant. Not depressed or anxious appearing.  Calm demeanor.  LEGS:  Fine purpuric rash on medial ankles.  No cellulitis  Assessment and Plan: Tick exposure Rash Doxy RMSF [place]  Signed,  Phillips OdorJeffery Natarsha Hurwitz, MD

## 2013-07-15 LAB — ROCKY MTN SPOTTED FVR AB, IGM-BLOOD: ROCKY MTN SPOTTED FEVER, IGM: 0.27 IV

## 2013-07-15 LAB — LYME AB/WESTERN BLOT REFLEX: B BURGDORFERI AB IGG+ IGM: 0.28 {ISR}

## 2013-08-30 ENCOUNTER — Other Ambulatory Visit: Payer: BC Managed Care – PPO

## 2013-08-30 ENCOUNTER — Other Ambulatory Visit: Payer: Self-pay | Admitting: Endocrinology

## 2013-09-04 ENCOUNTER — Ambulatory Visit: Payer: BC Managed Care – PPO | Admitting: Endocrinology

## 2013-09-05 ENCOUNTER — Other Ambulatory Visit: Payer: BC Managed Care – PPO

## 2013-09-11 ENCOUNTER — Ambulatory Visit: Payer: BC Managed Care – PPO | Admitting: Endocrinology

## 2013-09-16 ENCOUNTER — Telehealth: Payer: Self-pay | Admitting: Internal Medicine

## 2013-09-16 MED ORDER — HYDROCODONE-ACETAMINOPHEN 10-325 MG PO TABS: 1.0000 | ORAL_TABLET | Freq: Three times a day (TID) | ORAL | Status: DC | PRN

## 2013-09-16 NOTE — Addendum Note (Signed)
Addended by: Judd Gaudier on: 09/16/2013 04:56 PM   Modules accepted: Orders

## 2013-09-16 NOTE — Telephone Encounter (Signed)
Patient would like a refill on her Norco 10-325.  She would like for [place] to call when it's ready to pick up.  (her work #).  She has lunch 1-2, otherwise she should be available.  Thanks.

## 2013-09-16 NOTE — Telephone Encounter (Signed)
Give #60 with no refill. 

## 2013-09-16 NOTE — Telephone Encounter (Signed)
Norco rx printed.  Patient aware it will be ready 09/17/2013.

## 2013-09-27 ENCOUNTER — Other Ambulatory Visit: Payer: Self-pay | Admitting: Endocrinology

## 2013-09-27 ENCOUNTER — Other Ambulatory Visit: Payer: Self-pay | Admitting: Internal Medicine

## 2013-10-07 ENCOUNTER — Other Ambulatory Visit: Payer: BC Managed Care – PPO

## 2013-10-08 ENCOUNTER — Other Ambulatory Visit (INDEPENDENT_AMBULATORY_CARE_PROVIDER_SITE_OTHER): Payer: BC Managed Care – PPO

## 2013-10-08 DIAGNOSIS — E1165 Type 2 diabetes mellitus with hyperglycemia: Secondary | ICD-10-CM

## 2013-10-08 DIAGNOSIS — IMO0002 Reserved for concepts with insufficient information to code with codable children: Secondary | ICD-10-CM

## 2013-10-08 LAB — COMPREHENSIVE METABOLIC PANEL
ALT: 37 U/L — AB (ref 0–35)
AST: 34 U/L (ref 0–37)
Albumin: 4.5 g/dL (ref 3.5–5.2)
Alkaline Phosphatase: 39 U/L (ref 39–117)
BUN: 15 mg/dL (ref 6–23)
CO2: 26 mEq/L (ref 19–32)
CREATININE: 0.7 mg/dL (ref 0.4–1.2)
Calcium: 9.5 mg/dL (ref 8.4–10.5)
Chloride: 105 mEq/L (ref 96–112)
GFR: 88.37 mL/min (ref >60.00)
Glucose, Bld: 108 mg/dL — ABNORMAL HIGH (ref 70–99)
POTASSIUM: 4.2 meq/L (ref 3.5–5.1)
Sodium: 140 mEq/L (ref 135–145)
Total Bilirubin: 0.8 mg/dL (ref 0.2–1.2)
Total Protein: 7.9 g/dL (ref 6.0–8.3)

## 2013-10-08 LAB — HEMOGLOBIN A1C: HEMOGLOBIN A1C: 6.5 % (ref 4.6–6.5)

## 2013-10-10 ENCOUNTER — Ambulatory Visit (INDEPENDENT_AMBULATORY_CARE_PROVIDER_SITE_OTHER): Payer: BC Managed Care – PPO | Admitting: Endocrinology

## 2013-10-10 ENCOUNTER — Other Ambulatory Visit: Payer: Self-pay | Admitting: *Deleted

## 2013-10-10 ENCOUNTER — Encounter: Payer: Self-pay | Admitting: Endocrinology

## 2013-10-10 VITALS — BP 118/68 | HR 87 | Temp 98.3°F | Resp 16 | Ht 66.5 in | Wt 216.0 lb

## 2013-10-10 DIAGNOSIS — I10 Essential (primary) hypertension: Secondary | ICD-10-CM

## 2013-10-10 DIAGNOSIS — E1165 Type 2 diabetes mellitus with hyperglycemia: Secondary | ICD-10-CM

## 2013-10-10 DIAGNOSIS — IMO0002 Reserved for concepts with insufficient information to code with codable children: Secondary | ICD-10-CM

## 2013-10-10 MED ORDER — CANAGLIFLOZIN-METFORMIN HCL 150-1000 MG PO TABS: ORAL_TABLET | ORAL | Status: DC

## 2013-10-10 MED ORDER — INSULIN GLARGINE 100 UNIT/ML SOLOSTAR PEN: PEN_INJECTOR | SUBCUTANEOUS | Status: DC

## 2013-10-10 NOTE — Progress Notes (Signed)
Patient ID: Melissa Benson, female   DOB: 1960-11-05, 53 y.o.   MRN: [id number]   Reason for Appointment: Diabetes follow-up   History of Present Illness   Diagnosis: Type 2 DIABETES MELITUS, date of diagnosis: 2009      Previous history: She has been on various oral hypoglycemic drugs in the past and was on relatively large doses of basal insulin. With starting Victoza in 04/2011 she had drastically cut down her insulin requirement from her previous dosage of 80 units and had lost weight progressively also  Recent history: She has been on on Invokana since 06/2012 to help her glycemic control and provide some weight loss which he was not achieving despite using 1.8 mg Victoza. Also was not getting enough satiety with taking 1.8 mg Victoza  Also to help her with compliance and tolerability she has been on metformin ER With Invokana weight has been coming down again and she has been able to reduce her insulin dose somewhat more However she is adjusting her Lantus on a daily basis based on fasting reading and blood sugars are averaging 127 in the morning Not clear what her readings are after supper and sometimes with a snack late at night her morning sugar may be high She is tolerating the Invokana fairly well; only occasionally is having mild vaginal candidiasis symptoms and has used a prescription cream from her gynecologist for this She is  compliant with her evening Lantus after supper She is trying to walk fairly regularly Usually trying to be compliant with diet.  Oral hypoglycemic drugs: Metformin ER 2 g   Side effects from medications: she may get diarrhea with regular metformin in the morning Insulin regimen: 12 units Lantus in the evening               Monitors blood glucose: Once a day.    Glucometer: One Touch.          Blood Glucose readings from meter download:  PREMEAL Breakfast Lunch Dinner Bedtime Overall  Glucose range:  95-160   111   78-104   95    Mean/median:  126     101   104    Hypoglycemia frequency:  none        Meals: 3 meals per day. Some salads at lunch          Physical activity: exercise: Walking off and on, at work too           Dietician visit:  last 2009         Complications: are: None     Wt Readings from Last 3 Encounters:  10/10/13 216 lb (97.977 kg)  07/04/13 223 lb (101.152 kg)  06/18/13 223 lb (101.152 kg)    LABS:  Lab Results  Component Value Date   HGBA1C 6.5 10/08/2013   HGBA1C 6.6* 05/30/2013   HGBA1C 6.2 01/24/2013   Lab Results  Component Value Date   MICROALBUR 1.0 Repeated and verified X2. 01/24/2013   LDLCALC 44 01/24/2013   CREATININE 0.7 10/08/2013    Appointment on 10/08/2013  Component Date Value Ref Range Status  . Hemoglobin A1C 10/08/2013 6.5  4.6 - 6.5 % Final   Glycemic Control Guidelines for People with Diabetes:Non Diabetic:  <6%Goal of Therapy: <7%Additional Action Suggested:  >8%   . Sodium 10/08/2013 140  135 - 145 mEq/L Final  . Potassium 10/08/2013 4.2  3.5 - 5.1 mEq/L Final  . Chloride 10/08/2013 105  96 - 112 mEq/L Final  .  CO2 10/08/2013 26  19 - 32 mEq/L Final  . Glucose, Bld 10/08/2013 108* 70 - 99 mg/dL Final  . BUN 40/98/119110/06/2013 15  6 - 23 mg/dL Final  . Creatinine, Ser 10/08/2013 0.7  0.4 - 1.2 mg/dL Final  . Total Bilirubin 10/08/2013 0.8  0.2 - 1.2 mg/dL Final  . Alkaline Phosphatase 10/08/2013 39  39 - 117 U/L Final  . AST 10/08/2013 34  0 - 37 U/L Final  . ALT 10/08/2013 37* 0 - 35 U/L Final  . Total Protein 10/08/2013 7.9  6.0 - 8.3 g/dL Final  . Albumin 47/82/956210/06/2013 4.5  3.5 - 5.2 g/dL Final  . Calcium 13/08/657810/06/2013 9.5  8.4 - 10.5 mg/dL Final  . GFR 46/96/295210/06/2013 88.37  >60.00 mL/min Final      Medication List       This list is accurate as of: 10/10/13 12:43 PM.  Always use your most recent med list.               amLODipine 5 MG tablet  Commonly known as:  NORVASC  TAKE 1 TABLET BY MOUTH EVERY DAY     B-D ULTRAFINE III SHORT PEN 31G X 8 MM Misc  Generic drug:  Insulin Pen  Needle  USE AS DIRECTED     Canagliflozin-Metformin HCl [phone] MG Tabs  Take 1 tablet twice daily     cyclobenzaprine 10 MG tablet  Commonly known as:  FLEXERIL  Take 1 tablet (10 mg total) by mouth 3 (three) times daily as needed for muscle spasms.     doxycycline 100 MG capsule  Commonly known as:  VIBRAMYCIN  Take 1 capsule (100 mg total) by mouth 2 (two) times daily.     fluconazole 150 MG tablet  Commonly known as:  DIFLUCAN  Take 1 tablet (150 mg total) by mouth once.     furosemide 20 MG tablet  Commonly known as:  LASIX  Take 20 mg by mouth as needed.     glucose blood test strip  Commonly known as:  ONE TOUCH ULTRA TEST  Use as instructed to check blood sugars 4 times per day  Dx code 250.00     HYDROcodone-acetaminophen 10-325 MG per tablet  Commonly known as:  NORCO  Take 1 tablet by mouth every 8 (eight) hours as needed.     Insulin Glargine 100 UNIT/ML Solostar Pen  Commonly known as:  LANTUS SOLOSTAR  INJECT 50 UNITS INTO THE SKIN EVERY DAY     INVOKANA 300 MG Tabs  Generic drug:  Canagliflozin  TAKE 1 TABLET BY MOUTH EVERY DAY BEFORE BREAKFAST     metFORMIN 500 MG 24 hr tablet  Commonly known as:  GLUCOPHAGE-XR  TAKE 4 TABLETS BY MOUTH EVERY DAY WITH SUPPER     NON FORMULARY  CPAP     promethazine 25 MG tablet  Commonly known as:  PHENERGAN  TAKE 1 TABLET BY MOUTH EVERY 6 HOURS AS NEEDED FOR NAUSEA     ramipril 10 MG capsule  Commonly known as:  ALTACE  TAKE 1 CAPSULE BY MOUTH EVERY DAY     rosuvastatin 10 MG tablet  Commonly known as:  CRESTOR  Take 1 tablet (10 mg total) by mouth daily.     triamcinolone cream 0.1 %  Commonly known as:  KENALOG  Apply 1 application topically 3 (three) times daily.     VICTOZA 18 MG/3ML Sopn  Generic drug:  Liraglutide  INJECT 1.8 MG SUBCUTANEOUS DAILY  Vitamin D (Ergocalciferol) 50000 UNITS Caps capsule  Commonly known as:  DRISDOL  Take 1 capsule (50,000 Units total) by mouth every 7 (seven)  days.        Allergies: No Known Allergies  Past Medical History  Diagnosis Date  . Hypertension   . Rosacea   . Psoriasis   . Diabetes mellitus   . OSA (obstructive sleep apnea)     on CPAP  . History of migraine headaches   . Neck pain, chronic   . Hyperlipidemia     Past Surgical History  Procedure Laterality Date  . Cholecystectomy    . Appendectomy    . Tubal ligation    . Abdominal hysterectomy    . Spine surgery      C6-C7 fusion x2  . Spine surgery      herniated disc L4-L5  . Cardiovascular stress test  07/20/2011    Normal myocardial perfusion study, EKG negative for ischemia, no ECG changes  . 2d echocardiogram  07/20/2011    EF >55%, normal    Family History  Problem Relation Age of Onset  . Diabetes Mother   . Heart disease Mother   . Hypertension Mother   . Lung disease Father   . Heart disease Father   . Hypertension Father   . Diabetes Father   . Hypertension Sister   . Hyperlipidemia Brother   . Hypertension Brother   . Heart attack Maternal Grandmother   . Heart disease Maternal Grandmother   . Hypertension Maternal Grandfather   . Diabetes Maternal Grandfather   . Heart disease Maternal Grandfather     Social History:  reports that she quit smoking about 30 years ago. Her smoking use included Cigarettes. She has a 20 pack-year smoking history. She has never used smokeless tobacco. She reports that she does not drink alcohol. Her drug history is not on file.  Review of Systems:  Hypertension:  currently being treated with ramipril and amlodipine with good control. Home BP  120/80  Lipids: She is on Crestor for cardiovascular protection but takes it about every other day to minimize muscle aches  Lab Results  Component Value Date   CHOL 99 01/24/2013   HDL 41.90 01/24/2013   LDLCALC 44 01/24/2013   TRIG 65.0 01/24/2013   CHOLHDL 2 01/24/2013    She has had a history of leg edema in the past and currently not using Lasix  Taking  Vitamin D weekly as a supplement      Examination:   BP 118/68  Pulse 87  Temp(Src) 98.3 F (36.8 C)  Resp 16  Ht 5' 6.5" (1.689 m)  Wt 216 lb (97.977 kg)  BMI 34.35 kg/m2  SpO2 97%  Body mass index is 34.35 kg/(m^2).    ASSESSMENT/ PLAN:   Diabetes type 2   Blood glucose control is excellent with upper normal A1c She is on very small doses of insulin now but still has relatively higher fasting readings Has benefited from adding Invokana to her Victoza and metformin regimen Her weight has improved also She can exercise more regularly  Hypertension: Blood pressure is excellent and she will also followup with PCP  Recommendations today: She could try Invokamet but since she has had intolerance to regular metformin may not tolerate this  Patient Instructions  12 Lantus unless am sugar stays < 90  More sugars at bedtime     Melissa Benson 10/10/2013, 12:43 PM

## 2013-10-10 NOTE — Patient Instructions (Addendum)
12 Lantus unless am sugar stays < 90  More sugars at bedtime

## 2013-10-18 ENCOUNTER — Other Ambulatory Visit: Payer: Self-pay

## 2013-10-22 ENCOUNTER — Other Ambulatory Visit: Payer: BC Managed Care – PPO | Admitting: Internal Medicine

## 2013-10-22 DIAGNOSIS — Z1321 Encounter for screening for nutritional disorder: Secondary | ICD-10-CM

## 2013-10-22 DIAGNOSIS — Z1329 Encounter for screening for other suspected endocrine disorder: Secondary | ICD-10-CM

## 2013-10-22 DIAGNOSIS — I10 Essential (primary) hypertension: Secondary | ICD-10-CM

## 2013-10-22 DIAGNOSIS — Z130 Encounter for screening for diseases of the blood and blood-forming organs and certain disorders involving the immune mechanism: Secondary | ICD-10-CM

## 2013-10-22 DIAGNOSIS — Z0000 Encounter for general adult medical examination without abnormal findings: Secondary | ICD-10-CM

## 2013-10-22 DIAGNOSIS — Z13220 Encounter for screening for lipoid disorders: Secondary | ICD-10-CM

## 2013-10-22 DIAGNOSIS — E559 Vitamin D deficiency, unspecified: Secondary | ICD-10-CM

## 2013-10-22 DIAGNOSIS — E119 Type 2 diabetes mellitus without complications: Secondary | ICD-10-CM

## 2013-10-22 LAB — CBC WITH DIFFERENTIAL/PLATELET
BASOS PCT: 0 % (ref 0–1)
Basophils Absolute: 0 10*3/uL (ref 0.0–0.1)
EOS ABS: 0.1 10*3/uL (ref 0.0–0.7)
EOS PCT: 2 % (ref 0–5)
HEMATOCRIT: 38.4 % (ref 36.0–46.0)
HEMOGLOBIN: 12.9 g/dL (ref 12.0–15.0)
LYMPHS ABS: 2.1 10*3/uL (ref 0.7–4.0)
Lymphocytes Relative: 40 % (ref 12–46)
MCH: 28.8 pg (ref 26.0–34.0)
MCHC: 33.6 g/dL (ref 30.0–36.0)
MCV: 85.7 fL (ref 78.0–100.0)
MONO ABS: 0.5 10*3/uL (ref 0.1–1.0)
MONOS PCT: 9 % (ref 3–12)
NEUTROS PCT: 49 % (ref 43–77)
Neutro Abs: 2.5 10*3/uL (ref 1.7–7.7)
Platelets: 274 10*3/uL (ref 150–400)
RBC: 4.48 MIL/uL (ref 3.87–5.11)
RDW: 14.4 % (ref 11.5–15.5)
WBC: 5.2 10*3/uL (ref 4.0–10.5)

## 2013-10-22 LAB — LIPID PANEL
CHOLESTEROL: 110 mg/dL (ref 0–200)
HDL: 53 mg/dL (ref >39)
LDL Cholesterol: 46 mg/dL (ref 0–99)
TRIGLYCERIDES: 57 mg/dL (ref <150)
Total CHOL/HDL Ratio: 2.1 Ratio
VLDL: 11 mg/dL (ref 0–40)

## 2013-10-22 LAB — TSH: TSH: 1.787 u[IU]/mL (ref 0.350–4.500)

## 2013-10-22 LAB — COMPREHENSIVE METABOLIC PANEL
ALT: 29 U/L (ref 0–35)
AST: 24 U/L (ref 0–37)
Albumin: 4.4 g/dL (ref 3.5–5.2)
Alkaline Phosphatase: 42 U/L (ref 39–117)
BILIRUBIN TOTAL: 0.4 mg/dL (ref 0.2–1.2)
BUN: 19 mg/dL (ref 6–23)
CO2: 27 meq/L (ref 19–32)
Calcium: 9.6 mg/dL (ref 8.4–10.5)
Chloride: 102 mEq/L (ref 96–112)
Creat: 0.81 mg/dL (ref 0.50–1.10)
GLUCOSE: 107 mg/dL — AB (ref 70–99)
Potassium: 4.2 mEq/L (ref 3.5–5.3)
SODIUM: 139 meq/L (ref 135–145)
Total Protein: 6.7 g/dL (ref 6.0–8.3)

## 2013-10-23 LAB — VITAMIN D 25 HYDROXY (VIT D DEFICIENCY, FRACTURES): Vit D, 25-Hydroxy: 58 ng/mL (ref 30–89)

## 2013-10-24 ENCOUNTER — Encounter: Payer: Self-pay | Admitting: Internal Medicine

## 2013-10-24 ENCOUNTER — Ambulatory Visit (INDEPENDENT_AMBULATORY_CARE_PROVIDER_SITE_OTHER): Payer: BC Managed Care – PPO | Admitting: Internal Medicine

## 2013-10-24 VITALS — BP 138/80 | HR 105 | Temp 98.6°F | Ht 65.5 in | Wt 219.0 lb

## 2013-10-24 DIAGNOSIS — Z0000 Encounter for general adult medical examination without abnormal findings: Secondary | ICD-10-CM

## 2013-10-24 DIAGNOSIS — E8881 Metabolic syndrome: Secondary | ICD-10-CM

## 2013-10-24 DIAGNOSIS — G4730 Sleep apnea, unspecified: Secondary | ICD-10-CM

## 2013-10-24 DIAGNOSIS — M542 Cervicalgia: Secondary | ICD-10-CM

## 2013-10-24 DIAGNOSIS — E785 Hyperlipidemia, unspecified: Secondary | ICD-10-CM

## 2013-10-24 DIAGNOSIS — E119 Type 2 diabetes mellitus without complications: Secondary | ICD-10-CM

## 2013-10-24 DIAGNOSIS — E669 Obesity, unspecified: Secondary | ICD-10-CM

## 2013-10-24 DIAGNOSIS — I10 Essential (primary) hypertension: Secondary | ICD-10-CM

## 2013-10-24 DIAGNOSIS — G8929 Other chronic pain: Secondary | ICD-10-CM

## 2013-10-24 LAB — POCT URINALYSIS DIPSTICK
Bilirubin, UA: NEGATIVE
Blood, UA: NEGATIVE
Ketones, UA: NEGATIVE
Leukocytes, UA: NEGATIVE
NITRITE UA: NEGATIVE
PROTEIN UA: NEGATIVE
Urobilinogen, UA: NEGATIVE
pH, UA: 7

## 2013-10-25 LAB — MICROALBUMIN, URINE

## 2013-10-28 ENCOUNTER — Telehealth: Payer: Self-pay

## 2013-10-28 NOTE — Telephone Encounter (Signed)
Left message for patient to call office to give us some information regarding her CPAP so we can get her some assistance.  We need original sleep study.

## 2013-11-04 ENCOUNTER — Other Ambulatory Visit: Payer: Self-pay | Admitting: Endocrinology

## 2013-11-05 ENCOUNTER — Other Ambulatory Visit: Payer: Self-pay | Admitting: *Deleted

## 2013-11-05 ENCOUNTER — Telehealth: Payer: Self-pay | Admitting: *Deleted

## 2013-11-05 MED ORDER — LIRAGLUTIDE 18 MG/3ML ~~LOC~~ SOPN: PEN_INJECTOR | SUBCUTANEOUS | Status: DC

## 2013-11-05 MED ORDER — CANAGLIFLOZIN-METFORMIN HCL 150-1000 MG PO TABS: ORAL_TABLET | ORAL | Status: DC

## 2013-11-05 MED ORDER — CANAGLIFLOZIN 300 MG PO TABS: ORAL_TABLET | ORAL | Status: DC

## 2013-11-05 NOTE — Telephone Encounter (Signed)
Pharmacy called about patients medications, he said she's on Metformin and Invokana, he wants to know if she is suppose to be on both?  I see them both listed in her medication list.  Please advise

## 2013-11-05 NOTE — Telephone Encounter (Signed)
Noted, rx sent for invokana and metformin.

## 2013-11-05 NOTE — Telephone Encounter (Signed)
Probably best to continue metformin ER and Invokana separately instead of Invokamet

## 2013-11-14 ENCOUNTER — Other Ambulatory Visit: Payer: Self-pay | Admitting: Internal Medicine

## 2014-01-09 ENCOUNTER — Other Ambulatory Visit: Payer: Self-pay | Admitting: Endocrinology

## 2014-01-11 ENCOUNTER — Encounter: Payer: Self-pay | Admitting: Internal Medicine

## 2014-01-11 NOTE — Progress Notes (Signed)
   Subjective:    Patient ID: Melissa Benson, female    DOB: 09-22-1960, 54 y.o.   MRN: [id number]  HPI 54 year old White Female in today for health maintenance exam and evaluation of medical issues including diabetes mellitus, hypertension, hyperlipidemia, chronic neck pain, history of migraine headaches, history of sleep apnea, obesity, depression.  Past medical history: Appendectomy 1985, cholecystectomy 1986, bilateral tubal ligation 1984, hysterectomy with one ovary removed 2003. Exploratory surgery for GYN issues 3 times. Surgery for herniated cervical disc in 2004 and  2005. 2 pregnancies and no miscarriages. Takes hydrocodone/APAP for chronic neck pain.  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 She has 2 sons.  Family history: Father with history of diabetes. Mother with history of heart disease. 3 brothers all of whom have had substance abuse problems. 2 sisters, one of which is a diabetic.  Colonoscopy done by Dr. Ewing SchleinMagod September 2014 showing no adenomatous polyps.    Review of Systems  Constitutional: Positive for fatigue.  Eyes: Negative.   Respiratory: Negative.   Cardiovascular: Negative.   Gastrointestinal: Negative.   Genitourinary: Negative.   Hematological: Negative.   Psychiatric/Behavioral: Negative.        Objective:   Physical Exam  Constitutional: She is oriented to person, place, and time. She appears well-developed and well-nourished. No distress.  HENT:  Head: Normocephalic.  Right Ear: External ear normal.  Left Ear: External ear normal.  Mouth/Throat: Oropharynx is clear and moist. No oropharyngeal exudate.  Eyes: Conjunctivae are normal. Pupils are equal, round, and reactive to light. Right eye exhibits no discharge. Left eye exhibits no discharge. No scleral icterus.  Neck: Neck supple. No JVD present. No thyromegaly present.  Cardiovascular: Normal rate, regular rhythm, normal heart sounds and intact distal pulses.   No murmur  heard. Pulmonary/Chest: Effort normal and breath sounds normal. No respiratory distress. She has no wheezes. She has no rales. She exhibits no tenderness.  Abdominal: Soft. Bowel sounds are normal. She exhibits no distension and no mass. There is no tenderness. There is no rebound and no guarding.  Musculoskeletal: She exhibits no edema.  Lymphadenopathy:    She has no cervical adenopathy.  Neurological: She is alert and oriented to person, place, and time. She has normal reflexes. No cranial nerve deficit. Coordination normal.  Skin: Skin is warm and dry. No rash noted. She is not diaphoretic.  Psychiatric: She has a normal mood and affect. Her behavior is normal. Judgment and thought content normal.  Vitals reviewed.         Assessment & Plan:

## 2014-01-11 NOTE — Patient Instructions (Signed)
Continue same medications and continue to be followed by endocrinologist for diabetes. Return in 6-12 months or as needed.

## 2014-02-07 ENCOUNTER — Telehealth: Payer: Self-pay | Admitting: Internal Medicine

## 2014-02-07 ENCOUNTER — Other Ambulatory Visit: Payer: Self-pay | Admitting: *Deleted

## 2014-02-07 MED ORDER — HYDROCODONE-ACETAMINOPHEN 10-325 MG PO TABS: 1.0000 | ORAL_TABLET | Freq: Three times a day (TID) | ORAL | Status: DC | PRN

## 2014-02-07 NOTE — Telephone Encounter (Signed)
Left message for patient that her script is ready for pick up.

## 2014-02-07 NOTE — Telephone Encounter (Signed)
Printed script for Hydrocodone for Dr Lenord FellersBaxley to sign

## 2014-02-07 NOTE — Telephone Encounter (Signed)
Patient would like refill on her Norco 10-325.  She takes it 1 every 8 hours as needed.  Please call her work # when it is ready to pick up.  If she doesn't answer, patient advised it is ok to just tell the person answering the phone that her script is ready to pick up.    Thanks.

## 2014-02-07 NOTE — Telephone Encounter (Signed)
Please refill.

## 2014-02-10 ENCOUNTER — Other Ambulatory Visit: Payer: BC Managed Care – PPO

## 2014-02-11 ENCOUNTER — Other Ambulatory Visit: Payer: BC Managed Care – PPO

## 2014-02-13 ENCOUNTER — Ambulatory Visit: Payer: BC Managed Care – PPO | Admitting: Endocrinology

## 2014-03-03 ENCOUNTER — Encounter (HOSPITAL_COMMUNITY): Payer: Self-pay | Admitting: *Deleted

## 2014-03-03 ENCOUNTER — Emergency Department (HOSPITAL_COMMUNITY): Payer: BLUE CROSS/BLUE SHIELD

## 2014-03-03 ENCOUNTER — Emergency Department (HOSPITAL_COMMUNITY)
Admission: EM | Admit: 2014-03-03 | Discharge: 2014-03-04 | Disposition: A | Discharge status: Home / Self Care | Payer: BLUE CROSS/BLUE SHIELD | Attending: Emergency Medicine | Admitting: Emergency Medicine

## 2014-03-03 DIAGNOSIS — E785 Hyperlipidemia, unspecified: Secondary | ICD-10-CM | POA: Insufficient documentation

## 2014-03-03 DIAGNOSIS — S29092A Other injury of muscle and tendon of back wall of thorax, initial encounter: Secondary | ICD-10-CM | POA: Insufficient documentation

## 2014-03-03 DIAGNOSIS — G43909 Migraine, unspecified, not intractable, without status migrainosus: Secondary | ICD-10-CM | POA: Insufficient documentation

## 2014-03-03 DIAGNOSIS — Y9389 Activity, other specified: Secondary | ICD-10-CM | POA: Diagnosis not present

## 2014-03-03 DIAGNOSIS — Z794 Long term (current) use of insulin: Secondary | ICD-10-CM | POA: Insufficient documentation

## 2014-03-03 DIAGNOSIS — Z79899 Other long term (current) drug therapy: Secondary | ICD-10-CM | POA: Insufficient documentation

## 2014-03-03 DIAGNOSIS — E119 Type 2 diabetes mellitus without complications: Secondary | ICD-10-CM | POA: Diagnosis not present

## 2014-03-03 DIAGNOSIS — Z3202 Encounter for pregnancy test, result negative: Secondary | ICD-10-CM | POA: Insufficient documentation

## 2014-03-03 DIAGNOSIS — Y92410 Unspecified street and highway as the place of occurrence of the external cause: Secondary | ICD-10-CM | POA: Insufficient documentation

## 2014-03-03 DIAGNOSIS — M791 Myalgia, unspecified site: Secondary | ICD-10-CM

## 2014-03-03 DIAGNOSIS — Z87891 Personal history of nicotine dependence: Secondary | ICD-10-CM | POA: Diagnosis not present

## 2014-03-03 DIAGNOSIS — Z9981 Dependence on supplemental oxygen: Secondary | ICD-10-CM | POA: Insufficient documentation

## 2014-03-03 DIAGNOSIS — Y998 Other external cause status: Secondary | ICD-10-CM | POA: Insufficient documentation

## 2014-03-03 DIAGNOSIS — Z7952 Long term (current) use of systemic steroids: Secondary | ICD-10-CM | POA: Diagnosis not present

## 2014-03-03 DIAGNOSIS — G4733 Obstructive sleep apnea (adult) (pediatric): Secondary | ICD-10-CM | POA: Diagnosis not present

## 2014-03-03 DIAGNOSIS — G8929 Other chronic pain: Secondary | ICD-10-CM | POA: Diagnosis not present

## 2014-03-03 DIAGNOSIS — Z872 Personal history of diseases of the skin and subcutaneous tissue: Secondary | ICD-10-CM | POA: Insufficient documentation

## 2014-03-03 DIAGNOSIS — M546 Pain in thoracic spine: Secondary | ICD-10-CM

## 2014-03-03 DIAGNOSIS — I10 Essential (primary) hypertension: Secondary | ICD-10-CM | POA: Insufficient documentation

## 2014-03-03 LAB — CBG MONITORING, ED: GLUCOSE-CAPILLARY: 98 mg/dL (ref 70–99)

## 2014-03-03 LAB — POC URINE PREG, ED: Preg Test, Ur: NEGATIVE

## 2014-03-03 NOTE — ED Notes (Signed)
POC Preg: NEGATIVE 
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
## 2014-03-04 NOTE — ED Notes (Signed)
Ambulated pt from room to nurse station. Pt had steady gait with a slight limp to the side. Pt didn't complain of any pain.

## 2014-03-04 NOTE — Discharge Instructions (Signed)
Please call your doctor for a followup appointment within 24-48 hours. When you talk to your doctor please let them know that you were seen in the emergency department and have them acquire all of your records so that they can discuss the findings with you and formulate a treatment plan to fully care for your new and ongoing problems. Please follow-up with your primary care provider Please follow-up with orthopedics Please rest and stay hydrated Please avoid any physical strenuous activity Please massage with icy hot ointment and apply heat Please continue to monitor symptoms closely and if symptoms are to worsen or change (fever greater than 101, chills, sweating, nausea, vomiting, chest pain, shortness of breathe, difficulty breathing, weakness, numbness, tingling, worsening or changes to pain pattern, fall, injury, headache, dizziness, loss of sensation, inability to control urine or bowel movements) please report back to the Emergency Department immediately.   Muscle Pain Muscle pain (myalgia) may be caused by many things, including:  Overuse or muscle strain, especially if you are not in shape. This is the most common cause of muscle pain.  Injury.  Bruises.  Viruses, such as the flu.  Infectious diseases.  Fibromyalgia, which is a chronic condition that causes muscle tenderness, fatigue, and headache.  Autoimmune diseases, including lupus.  Certain drugs, including ACE inhibitors and statins. Muscle pain may be mild or severe. In most cases, the pain lasts only a short time and goes away without treatment. To diagnose the cause of your muscle pain, your health care provider will take your medical history. This means he or she will ask you when your muscle pain began and what has been happening. If you have not had muscle pain for very long, your health care provider may want to wait before doing much testing. If your muscle pain has lasted a long time, your health care provider may  want to run tests right away. If your health care provider thinks your muscle pain may be caused by illness, you may need to have additional tests to rule out certain conditions.  Treatment for muscle pain depends on the cause. Home care is often enough to relieve muscle pain. Your health care provider may also prescribe anti-inflammatory medicine. HOME CARE INSTRUCTIONS Watch your condition for any changes. The following actions may help to lessen any discomfort you are feeling:  Only take over-the-counter or prescription medicines as directed by your health care provider.  Apply ice to the sore muscle:  Put ice in a plastic bag.  Place a towel between your skin and the bag.  Leave the ice on for 15-20 minutes, 3-4 times a day.  You may alternate applying hot and cold packs to the muscle as directed by your health care provider.  If overuse is causing your muscle pain, slow down your activities until the pain goes away.  Remember that it is normal to feel some muscle pain after starting a workout program. Muscles that have not been used often will be sore at first.  Do regular, gentle exercises if you are not usually active.  Warm up before exercising to lower your risk of muscle pain.  Do not continue working out if the pain is very bad. Bad pain could mean you have injured a muscle. SEEK MEDICAL CARE IF:  Your muscle pain gets worse, and medicines do not help.  You have muscle pain that lasts longer than 3 days.  You have a rash or fever along with muscle pain.  You have muscle pain  after a tick bite.  You have muscle pain while working out, even though you are in good physical condition.  You have redness, soreness, or swelling along with muscle pain.  You have muscle pain after starting a new medicine or changing the dose of a medicine. SEEK IMMEDIATE MEDICAL CARE IF:  You have trouble breathing.  You have trouble swallowing.  You have muscle pain along with a  stiff neck, fever, and vomiting.  You have severe muscle weakness or cannot move part of your body. MAKE SURE YOU:   Understand these instructions.  Will watch your condition.  Will get help right away if you are not doing well or get worse. Document Released: 11/11/2005 Document Revised: 12/25/2012 Document Reviewed: 10/16/2012 [facility] Patient Information 2015 [place], [place]. This information is not intended to replace advice given to you by your health care provider. Make sure you discuss any questions you have with your health care provider.

## 2014-03-11 ENCOUNTER — Other Ambulatory Visit: Payer: Self-pay | Admitting: Endocrinology

## 2014-03-24 ENCOUNTER — Other Ambulatory Visit: Payer: Self-pay

## 2014-03-27 ENCOUNTER — Ambulatory Visit: Payer: Self-pay | Admitting: Endocrinology

## 2014-04-04 ENCOUNTER — Other Ambulatory Visit (INDEPENDENT_AMBULATORY_CARE_PROVIDER_SITE_OTHER): Payer: BLUE CROSS/BLUE SHIELD

## 2014-04-04 DIAGNOSIS — E1165 Type 2 diabetes mellitus with hyperglycemia: Secondary | ICD-10-CM | POA: Diagnosis not present

## 2014-04-04 DIAGNOSIS — IMO0002 Reserved for concepts with insufficient information to code with codable children: Secondary | ICD-10-CM

## 2014-04-04 LAB — BASIC METABOLIC PANEL
BUN: 15 mg/dL (ref 6–23)
CALCIUM: 9.6 mg/dL (ref 8.4–10.5)
CHLORIDE: 106 meq/L (ref 96–112)
CO2: 26 meq/L (ref 19–32)
Creatinine, Ser: 0.9 mg/dL (ref 0.40–1.20)
GFR: 69.27 mL/min (ref >60.00)
GLUCOSE: 113 mg/dL — AB (ref 70–99)
Potassium: 4.2 mEq/L (ref 3.5–5.1)
SODIUM: 138 meq/L (ref 135–145)

## 2014-04-04 LAB — HEMOGLOBIN A1C: Hgb A1c MFr Bld: 6.6 % — ABNORMAL HIGH (ref 4.6–6.5)

## 2014-04-07 ENCOUNTER — Telehealth: Payer: Self-pay | Admitting: *Deleted

## 2014-04-07 NOTE — Telephone Encounter (Signed)
Left message on patient voice mail notifying her she needs appt before any refills can be given

## 2014-04-07 NOTE — Telephone Encounter (Signed)
No appt since Oct 2015. Past due for 6 month recheck. Cannot fill until seen.

## 2014-04-07 NOTE — Telephone Encounter (Signed)
Patient would like a refill on her Hydrocodone .Ok to refill?

## 2014-04-08 ENCOUNTER — Encounter: Payer: Self-pay | Admitting: Endocrinology

## 2014-04-08 ENCOUNTER — Ambulatory Visit (INDEPENDENT_AMBULATORY_CARE_PROVIDER_SITE_OTHER): Payer: BLUE CROSS/BLUE SHIELD | Admitting: Endocrinology

## 2014-04-08 ENCOUNTER — Other Ambulatory Visit: Payer: Self-pay | Admitting: *Deleted

## 2014-04-08 VITALS — BP 124/82 | HR 90 | Temp 97.7°F | Resp 16 | Ht 65.5 in | Wt 220.2 lb

## 2014-04-08 DIAGNOSIS — E119 Type 2 diabetes mellitus without complications: Secondary | ICD-10-CM

## 2014-04-08 DIAGNOSIS — I10 Essential (primary) hypertension: Secondary | ICD-10-CM | POA: Diagnosis not present

## 2014-04-08 DIAGNOSIS — E785 Hyperlipidemia, unspecified: Secondary | ICD-10-CM | POA: Diagnosis not present

## 2014-04-08 MED ORDER — INSULIN PEN NEEDLE 32G X 5 MM MISC: Status: DC

## 2014-04-08 MED ORDER — LIRAGLUTIDE -WEIGHT MANAGEMENT 18 MG/3ML ~~LOC~~ SOPN: 3.0000 mg | PEN_INJECTOR | Freq: Every day | SUBCUTANEOUS | Status: DC

## 2014-04-08 NOTE — Patient Instructions (Signed)
Please check blood sugars at least half the time about 2 hours after any meal and 3 times per week on waking up. Please bring blood sugar monitor to each visit. Recommended blood sugar levels about 2 hours after meal is 140-180 and on waking up 90-130  Increase walking as tolerated

## 2014-04-08 NOTE — Progress Notes (Signed)
Patient ID: Melissa Benson, female   DOB: Aug 26, 1960, 54 y.o.   MRN: [id number]   Reason for Appointment: Diabetes follow-up   History of Present Illness   Diagnosis: Type 2 DIABETES MELITUS, date of diagnosis: 2009      Previous history: She has been on various oral hypoglycemic drugs in the past and was on relatively large doses of basal insulin. With starting Victoza in 04/2011 she had drastically cut down her insulin requirement from her previous dosage of 80 units and had lost weight progressively also  Recent history:  She has not been seen in follow-up since 10/2013 Although her A1c is still upper normal she thinks she has had fluctuation in her weight and recently has gone up overall because of difficulties with exercise, knee pain and stress Her insulin requirement with LANTUS has gone up considerably, previously was taking only 12 units; has done this for about 2 months now She has been on on Invokana since 06/2012 to help her glycemic control and provide some weight loss which he was not achieving despite using 1.8 mg Victoza.  Again is not getting enough satiety with taking 1.8 mg Victoza and will eat more late at night and sometimes not controlling portions of carbohydrates However HOME blood sugars are looking fairly good except higher this morning Not checking many readings after supper or bedtime Continues to take metformin ER She is  compliant with her evening Lantus after supper   Oral hypoglycemic drugs: Metformin ER 2 g   Side effects from medications: she may get diarrhea with regular metformin in the morning Insulin regimen: 30 units Lantus in the evening for 2 mths              Monitors blood glucose: Once a day.    Glucometer: One Touch.          Blood Glucose readings from meter download:  PRE-MEAL Breakfast Lunch Dinner 7-11 PM Overall  Glucose range: 114-152 92, 107 106, 127 99-129   Mean/median: 130    122   Hypoglycemia frequency:  none        Meals: 3  meals per day. Some salads at lunchotherwise sandwiches          Physical activity: exercise: Walking less recently is going to restart now with resolution of her knee pain  Dietician visit:  last 2009          Wt Readings from Last 3 Encounters:  04/08/14 220 lb 3.2 oz (99.882 kg)  03/03/14 225 lb (102.059 kg)  10/24/13 219 lb (99.338 kg)    LABS:  Lab Results  Component Value Date   HGBA1C 6.6* 04/04/2014   HGBA1C 6.5 10/08/2013   HGBA1C 6.6* 05/30/2013   Lab Results  Component Value Date   MICROALBUR <0.2 10/24/2013   LDLCALC 46 10/22/2013   CREATININE 0.90 04/04/2014    Lab on 04/04/2014  Component Date Value Ref Range Status  . Hgb A1c MFr Bld 04/04/2014 6.6* 4.6 - 6.5 % Final   Glycemic Control Guidelines for People with Diabetes:Non Diabetic:  <6%Goal of Therapy: <7%Additional Action Suggested:  >8%   . Sodium 04/04/2014 138  135 - 145 mEq/L Final  . Potassium 04/04/2014 4.2  3.5 - 5.1 mEq/L Final  . Chloride 04/04/2014 106  96 - 112 mEq/L Final  . CO2 04/04/2014 26  19 - 32 mEq/L Final  . Glucose, Bld 04/04/2014 113* 70 - 99 mg/dL Final  . BUN 40/98/1191 15  6 - 23  mg/dL Final  . Creatinine, Ser 04/04/2014 0.90  0.40 - 1.20 mg/dL Final  . Calcium 81/19/1478 9.6  8.4 - 10.5 mg/dL Final  . GFR 29/56/2130 69.27  >60.00 mL/min Final      Medication List       This list is accurate as of: 04/08/14  8:58 PM.  Always use your most recent med list.               amLODipine 5 MG tablet  Commonly known as:  NORVASC  TAKE 1 TABLET BY MOUTH EVERY DAY     clotrimazole-betamethasone cream  Commonly known as:  LOTRISONE     cyclobenzaprine 10 MG tablet  Commonly known as:  FLEXERIL  Take 1 tablet (10 mg total) by mouth 3 (three) times daily as needed for muscle spasms.     FINACEA 15 % cream  Generic drug:  Azelaic Acid     fluconazole 150 MG tablet  Commonly known as:  DIFLUCAN     HYDROcodone-acetaminophen 10-325 MG per tablet  Commonly known as:  NORCO   Take 1 tablet by mouth every 8 (eight) hours as needed.     Insulin Pen Needle 32G X 5 MM Misc  Commonly known as:  NOVOTWIST  Use daily to inject saxenda     INVOKANA 300 MG Tabs tablet  Generic drug:  canagliflozin  TAKE 1 TABLET BY MOUTH EVERY DAY BEFORE BREAKFAST     LANTUS SOLOSTAR 100 UNIT/ML Solostar Pen  Generic drug:  Insulin Glargine  INJECT 50 UNITS UNDER THE SKIN EVERY DAY     Liraglutide -Weight Management 18 MG/3ML Sopn  Commonly known as:  SAXENDA  Inject 3 mg into the skin daily.     meclizine 25 MG tablet  Commonly known as:  ANTIVERT  TAKE 1 TABLET BY MOUTH THREE TIMES DAILY AS NEEDED     metFORMIN 500 MG 24 hr tablet  Commonly known as:  GLUCOPHAGE-XR  TAKE 4 TABLETS BY MOUTH EVERY DAY WITH SUPPER     NON FORMULARY  CPAP     ONE TOUCH ULTRA TEST test strip  Generic drug:  glucose blood  USE TO CHECK BLOOD SUGAR FOUR TIMES DAILY EVERY     promethazine 25 MG tablet  Commonly known as:  PHENERGAN  TAKE 1 TABLET BY MOUTH EVERY 6 HOURS AS NEEDED FOR NAUSEA     ramipril 10 MG capsule  Commonly known as:  ALTACE  TAKE 1 CAPSULE BY MOUTH EVERY DAY     rosuvastatin 10 MG tablet  Commonly known as:  CRESTOR  Take 1 tablet (10 mg total) by mouth daily.     triamcinolone cream 0.1 %  Commonly known as:  KENALOG  Apply 1 application topically 3 (three) times daily.     VICTOZA 18 MG/3ML Sopn  Generic drug:  Liraglutide  INJECT 1.8 MG UNDER THE SKIN EVERY DAY.     Vitamin D (Ergocalciferol) 50000 UNITS Caps capsule  Commonly known as:  DRISDOL  TAKE ONE CAPSULE BY MOUTH EVERY 7 DAYS        Allergies: No Known Allergies  Past Medical History  Diagnosis Date  . Hypertension   . Rosacea   . Psoriasis   . Diabetes mellitus   . OSA (obstructive sleep apnea)     on CPAP  . History of migraine headaches   . Neck pain, chronic   . Hyperlipidemia     Past Surgical History  Procedure Laterality Date  . Cholecystectomy    .  Appendectomy    .  Tubal ligation    . Abdominal hysterectomy    . Spine surgery      C6-C7 fusion x2  . Spine surgery      herniated disc L4-L5  . Cardiovascular stress test  07/20/2011    Normal myocardial perfusion study, EKG negative for ischemia, no ECG changes  . 2d echocardiogram  07/20/2011    EF >55%, normal    Family History  Problem Relation Age of Onset  . Diabetes Mother   . Heart disease Mother   . Hypertension Mother   . Lung disease Father   . Heart disease Father   . Hypertension Father   . Diabetes Father   . Hypertension Sister   . Hyperlipidemia Brother   . Hypertension Brother   . Heart attack Maternal Grandmother   . Heart disease Maternal Grandmother   . Hypertension Maternal Grandfather   . Diabetes Maternal Grandfather   . Heart disease Maternal Grandfather     Social History:  reports that she quit smoking about 30 years ago. Her smoking use included Cigarettes. She has a 20 pack-year smoking history. She has never used smokeless tobacco. She reports that she does not drink alcohol. Her drug history is not on file.  Review of Systems:  Hypertension:  currently being treated with ramipril and amlodipine with good control. Home BP Usually near normal  Lipids: She is on Crestor for cardiovascular protection but takes it about every other day to minimize muscle aches  Lab Results  Component Value Date   CHOL 110 10/22/2013   HDL 53 10/22/2013   LDLCALC 46 10/22/2013   TRIG 57 10/22/2013   CHOLHDL 2.1 10/22/2013      Taking Vitamin D weekly as a supplement from PCP      Examination:   BP 124/82 mmHg  Pulse 90  Temp(Src) 97.7 F (36.5 C)  Resp 16  Ht 5' 5.5" (1.664 m)  Wt 220 lb 3.2 oz (99.882 kg)  BMI 36.07 kg/m2  SpO2 97%  Body mass index is 36.07 kg/(m^2).    ASSESSMENT/ PLAN:   Diabetes type 2   Blood glucose control is fairly good with upper normal A1c again Although she was taking only 12 units of Lantus on the previous visits he is now  taking 30 She has required more insulin because of inactivity, stress eating, tendency to weight gain and possibly some progression of her diabetes  This is despite her taking Invokana and 1.8 mg Victoza She can exercise more regularly  Recommendations today:  She will try to get Saxenda to help her with weight loss and improve insulin sensitivity  She is going to look into bariatric surgery on her own  Progressively increase exercise  May be able to taper down insulin if she is able to get Saxenda.  Discussed how to titrate this up to 2.4 initially and then 3.0 if tolerated.  Patient information brochures and co-pay card given   Patient Instructions  Please check blood sugars at least half the time about 2 hours after any meal and 3 times per week on waking up. Please bring blood sugar monitor to each visit. Recommended blood sugar levels about 2 hours after meal is 140-180 and on waking up 90-130  Increase walking as tolerated     Aaliyha Mumford 04/08/2014, 8:58 PM

## 2014-04-13 ENCOUNTER — Other Ambulatory Visit: Payer: Self-pay | Admitting: Endocrinology

## 2014-04-13 ENCOUNTER — Other Ambulatory Visit: Payer: Self-pay | Admitting: Internal Medicine

## 2014-04-14 NOTE — Telephone Encounter (Signed)
Please advise if pt is to continue taking metformin. Thanks!

## 2014-04-16 NOTE — Telephone Encounter (Signed)
Please advise on: Refills and Metformin (taking or not taking)?

## 2014-04-16 NOTE — Telephone Encounter (Signed)
Patient stated that her pharmacy was faxing over a request for her lantus, victoza and medication for weigh loss, she doesn't know what the name of it is. She thinks it needs a Prior Auth. (weigh loss meds)

## 2014-04-17 ENCOUNTER — Other Ambulatory Visit: Payer: Self-pay | Admitting: Endocrinology

## 2014-04-17 MED ORDER — LIRAGLUTIDE 18 MG/3ML ~~LOC~~ SOPN: PEN_INJECTOR | SUBCUTANEOUS | Status: DC

## 2014-04-17 MED ORDER — METFORMIN HCL ER 500 MG PO TB24: ORAL_TABLET | ORAL | Status: DC

## 2014-04-17 NOTE — Telephone Encounter (Signed)
rx sent, patient is aware 

## 2014-04-28 ENCOUNTER — Other Ambulatory Visit: Payer: BLUE CROSS/BLUE SHIELD | Admitting: Internal Medicine

## 2014-04-28 DIAGNOSIS — Z13220 Encounter for screening for lipoid disorders: Secondary | ICD-10-CM

## 2014-04-28 DIAGNOSIS — Z79899 Other long term (current) drug therapy: Secondary | ICD-10-CM

## 2014-04-28 DIAGNOSIS — E119 Type 2 diabetes mellitus without complications: Secondary | ICD-10-CM

## 2014-04-29 ENCOUNTER — Telehealth: Payer: Self-pay | Admitting: Endocrinology

## 2014-04-29 LAB — HEPATIC FUNCTION PANEL
ALK PHOS: 37 U/L — AB (ref 39–117)
ALT: 27 U/L (ref 0–35)
AST: 19 U/L (ref 0–37)
Albumin: 4 g/dL (ref 3.5–5.2)
BILIRUBIN DIRECT: 0.1 mg/dL (ref 0.0–0.3)
Indirect Bilirubin: 0.2 mg/dL (ref 0.2–1.2)
Total Bilirubin: 0.3 mg/dL (ref 0.2–1.2)
Total Protein: 6.6 g/dL (ref 6.0–8.3)

## 2014-04-29 LAB — LIPID PANEL
Cholesterol: 99 mg/dL (ref 0–200)
HDL: 48 mg/dL (ref >=46)
LDL Cholesterol: 37 mg/dL (ref 0–99)
TRIGLYCERIDES: 69 mg/dL (ref <150)
Total CHOL/HDL Ratio: 2.1 Ratio
VLDL: 14 mg/dL (ref 0–40)

## 2014-04-29 LAB — HEMOGLOBIN A1C
HEMOGLOBIN A1C: 6.5 % — AB (ref <5.7)
Mean Plasma Glucose: 140 mg/dL — ABNORMAL HIGH (ref <117)

## 2014-04-29 NOTE — Telephone Encounter (Signed)
Patient need PA on Saxenda, Morrie Sheldonshley has question on medication please advise  [phone]

## 2014-04-30 NOTE — Telephone Encounter (Signed)
Information given

## 2014-05-01 ENCOUNTER — Ambulatory Visit (INDEPENDENT_AMBULATORY_CARE_PROVIDER_SITE_OTHER): Payer: BLUE CROSS/BLUE SHIELD | Admitting: Internal Medicine

## 2014-05-01 ENCOUNTER — Encounter: Payer: Self-pay | Admitting: Internal Medicine

## 2014-05-01 VITALS — BP 116/64 | HR 100 | Temp 99.0°F | Wt 226.0 lb

## 2014-05-01 DIAGNOSIS — E785 Hyperlipidemia, unspecified: Secondary | ICD-10-CM

## 2014-05-01 DIAGNOSIS — G8929 Other chronic pain: Secondary | ICD-10-CM | POA: Diagnosis not present

## 2014-05-01 DIAGNOSIS — E109 Type 1 diabetes mellitus without complications: Secondary | ICD-10-CM

## 2014-05-01 DIAGNOSIS — M791 Myalgia: Secondary | ICD-10-CM | POA: Diagnosis not present

## 2014-05-01 DIAGNOSIS — E669 Obesity, unspecified: Secondary | ICD-10-CM | POA: Diagnosis not present

## 2014-05-01 DIAGNOSIS — I10 Essential (primary) hypertension: Secondary | ICD-10-CM | POA: Diagnosis not present

## 2014-05-01 DIAGNOSIS — E8881 Metabolic syndrome: Secondary | ICD-10-CM

## 2014-05-01 DIAGNOSIS — M7918 Myalgia, other site: Secondary | ICD-10-CM

## 2014-05-01 MED ORDER — HYDROCODONE-ACETAMINOPHEN 10-325 MG PO TABS: 1.0000 | ORAL_TABLET | Freq: Three times a day (TID) | ORAL | Status: DC | PRN

## 2014-05-01 NOTE — Patient Instructions (Addendum)
Norco refilled. RTC in 6 months for CPE. Continue same meds. To get Prevnar next month. Diet exercise and weight loss education provided.

## 2014-05-01 NOTE — Progress Notes (Signed)
   Subjective:    Patient ID: Melissa Benson, female    DOB: 11-16-1960, 54 y.o.   MRN: [id number]  HPI  Patient in today for six-month recheck. Recently saw Dr. Lucianne MussKumar. She is considering weight loss surgery. She's gained 7 pounds since last visit October 2015. I'm disappointed that she's not been able to lose weight. Says her mother is going to have bypass surgery and valve replacement in the near future.  Her lipid panel is normal. Hemoglobin A1c is 6.5%. Blood pressure is under good control. She has no new complaints or problems but needs refill on hydrocodone APAP  for chronic musculoskeletal pain    Review of Systems     Objective:   Physical Exam  Skin warm and dry. Nodes none. Neck supple without thyromegaly. Chest clear to auscultation. Cardiac exam regular rate and rhythm normal S1 and S2.      Assessment & Plan:  Controlled type 2 diabetes mellitus-followed by Endocrinology  Hypertension-stable on current regimen  Hyperlipidemia-lipid panel normal on current regimen  Obesity  Metabolic syndrome  Plan: Patient is to work on diet exercise and weight loss. Return in 6 months for physical examination. Refill hydrocodone/APAP No. 90 with no refill. She may contact [facility] for information regarding weight loss surgery but needs to have made an effort at diet exercise and weight loss for 6 months prior to surgery for most insurances to  pay for this

## 2014-05-02 LAB — MICROALBUMIN / CREATININE URINE RATIO
CREATININE, URINE: 69.9 mg/dL
Microalb Creat Ratio: 2.9 mg/g (ref 0.0–30.0)
Microalb, Ur: 0.2 mg/dL (ref <2.0)

## 2014-05-09 ENCOUNTER — Ambulatory Visit (INDEPENDENT_AMBULATORY_CARE_PROVIDER_SITE_OTHER): Payer: BLUE CROSS/BLUE SHIELD | Admitting: Internal Medicine

## 2014-05-09 ENCOUNTER — Encounter: Payer: Self-pay | Admitting: Internal Medicine

## 2014-05-09 VITALS — BP 118/68 | HR 94 | Temp 98.7°F | Wt 227.0 lb

## 2014-05-09 DIAGNOSIS — N390 Urinary tract infection, site not specified: Secondary | ICD-10-CM | POA: Diagnosis not present

## 2014-05-09 DIAGNOSIS — B373 Candidiasis of vulva and vagina: Secondary | ICD-10-CM | POA: Diagnosis not present

## 2014-05-09 DIAGNOSIS — B3731 Acute candidiasis of vulva and vagina: Secondary | ICD-10-CM

## 2014-05-09 DIAGNOSIS — R300 Dysuria: Secondary | ICD-10-CM

## 2014-05-09 DIAGNOSIS — R8290 Unspecified abnormal findings in urine: Secondary | ICD-10-CM | POA: Diagnosis not present

## 2014-05-09 LAB — POCT URINALYSIS DIPSTICK
Ketones, UA: NEGATIVE
NITRITE UA: POSITIVE
PH UA: 6
Protein, UA: NEGATIVE
Spec Grav, UA: 1.015
UROBILINOGEN UA: NEGATIVE

## 2014-05-09 MED ORDER — NITROFURANTOIN MONOHYD MACRO 100 MG PO CAPS: 100.0000 mg | ORAL_CAPSULE | Freq: Two times a day (BID) | ORAL | Status: DC

## 2014-05-09 MED ORDER — TERCONAZOLE 0.4 % VA CREA: 1.0000 | TOPICAL_CREAM | Freq: Every day | VAGINAL | Status: DC

## 2014-05-09 NOTE — Patient Instructions (Addendum)
Macrobid 100 mg twice a day for 7 days. Terazol 7 vaginal cream at bedtime for 7 days if Candida vaginitis develops

## 2014-05-09 NOTE — Progress Notes (Signed)
   Subjective:    Patient ID: Melissa Benson, female    DOB: [birth year], 54 y.o.   MRN: [id number]  HPI  Onset burning end of urinary stream on Wednesday. No fever or shaking chills. No back pain. No nausea or vomiting. Has not had a urinary tract infection and a number of years. Can't remember when she last had one.    Review of Systems     Objective:   Physical Exam  No CVA tenderness.  Dipstick urine abnormal. Culture pending.      Assessment & Plan:  Acute UTI  Plan: Macrobid 100 mg twice daily for 7 days. Terazol 7 vaginal cream 1 applicator full in vagina daily at bedtime 7 days should Candida vaginitis developed while on antibiotics.

## 2014-05-11 LAB — URINE CULTURE
Colony Count: NO GROWTH
Organism ID, Bacteria: NO GROWTH

## 2014-05-23 ENCOUNTER — Encounter: Payer: Self-pay | Admitting: Internal Medicine

## 2014-05-23 ENCOUNTER — Encounter: Payer: Self-pay | Admitting: Endocrinology

## 2014-05-26 ENCOUNTER — Other Ambulatory Visit: Payer: Self-pay | Admitting: Endocrinology

## 2014-05-26 ENCOUNTER — Other Ambulatory Visit: Payer: Self-pay | Admitting: Internal Medicine

## 2014-06-30 ENCOUNTER — Encounter: Payer: Self-pay | Admitting: *Deleted

## 2014-07-10 ENCOUNTER — Telehealth: Payer: Self-pay | Admitting: Internal Medicine

## 2014-07-10 MED ORDER — HYDROCODONE-ACETAMINOPHEN 10-325 MG PO TABS: 1.0000 | ORAL_TABLET | Freq: Three times a day (TID) | ORAL | Status: DC | PRN

## 2014-07-10 NOTE — Telephone Encounter (Signed)
Hydrocodone refilled.  

## 2014-07-10 NOTE — Telephone Encounter (Signed)
Refill once 

## 2014-07-10 NOTE — Telephone Encounter (Signed)
Patient would like her hydrocodone meds refilled.

## 2014-07-11 ENCOUNTER — Ambulatory Visit (INDEPENDENT_AMBULATORY_CARE_PROVIDER_SITE_OTHER): Payer: BLUE CROSS/BLUE SHIELD | Admitting: Internal Medicine

## 2014-07-11 ENCOUNTER — Encounter: Payer: Self-pay | Admitting: Internal Medicine

## 2014-07-11 VITALS — BP 122/82 | HR 94 | Temp 98.4°F | Wt 228.0 lb

## 2014-07-11 DIAGNOSIS — M542 Cervicalgia: Secondary | ICD-10-CM

## 2014-07-11 DIAGNOSIS — B009 Herpesviral infection, unspecified: Secondary | ICD-10-CM | POA: Diagnosis not present

## 2014-07-11 DIAGNOSIS — IMO0002 Reserved for concepts with insufficient information to code with codable children: Secondary | ICD-10-CM

## 2014-07-11 DIAGNOSIS — M6749 Ganglion, multiple sites: Secondary | ICD-10-CM | POA: Diagnosis not present

## 2014-07-11 DIAGNOSIS — G8929 Other chronic pain: Secondary | ICD-10-CM

## 2014-07-11 MED ORDER — VALACYCLOVIR HCL 500 MG PO TABS: ORAL_TABLET | ORAL | Status: DC

## 2014-07-11 NOTE — Patient Instructions (Signed)
Take Valtrex 500 mg twice daily for 5 days for rash around nostrils. If this does not help call back and we will prescribe antibiotic. Cyst left palm can be observed. Take narcotic pain medication sparingly for neck pain.

## 2014-08-03 NOTE — Progress Notes (Signed)
   Subjective:    Patient ID: Melissa Benson, female    DOB: [birth year], 54 y.o.   MRN: [id number]  HPI  In today for a couple of different reasons. Has rash around nostrils that is irritated and tender. Sometimes gets this and doesn't know what to do about it. Also has noticed a knot on her palm that she is concerned about. Needs refill on hydrocodone for neck pain.    Review of Systems     Objective:   Physical Exam Lesion left palm palm appears to be a benign lesion likely a cyst. This can be observed. Has red irritated rash about her nostrils consistent with herpes simplex type I       Assessment & Plan:  Chronic neck pain-refill hydrocodone/APAP  Herpes simplex type I about nostrils. Place on Valtrex 500 mg twice daily for 5 days  Lesion on palm can be observed. Likely a cystic lesion.

## 2014-08-05 ENCOUNTER — Telehealth: Payer: Self-pay | Admitting: Internal Medicine

## 2014-08-05 ENCOUNTER — Other Ambulatory Visit (INDEPENDENT_AMBULATORY_CARE_PROVIDER_SITE_OTHER): Payer: BLUE CROSS/BLUE SHIELD

## 2014-08-05 DIAGNOSIS — E119 Type 2 diabetes mellitus without complications: Secondary | ICD-10-CM | POA: Diagnosis not present

## 2014-08-05 LAB — COMPREHENSIVE METABOLIC PANEL
ALK PHOS: 39 U/L (ref 39–117)
ALT: 29 U/L (ref 0–35)
AST: 25 U/L (ref 0–37)
Albumin: 4.5 g/dL (ref 3.5–5.2)
BUN: 18 mg/dL (ref 6–23)
CALCIUM: 9.6 mg/dL (ref 8.4–10.5)
CO2: 26 meq/L (ref 19–32)
Chloride: 105 mEq/L (ref 96–112)
Creatinine, Ser: 0.7 mg/dL (ref 0.40–1.20)
GFR: 92.47 mL/min (ref >60.00)
Glucose, Bld: 117 mg/dL — ABNORMAL HIGH (ref 70–99)
Potassium: 4.1 mEq/L (ref 3.5–5.1)
SODIUM: 139 meq/L (ref 135–145)
TOTAL PROTEIN: 7.3 g/dL (ref 6.0–8.3)
Total Bilirubin: 0.5 mg/dL (ref 0.2–1.2)

## 2014-08-05 LAB — HEMOGLOBIN A1C: HEMOGLOBIN A1C: 6.1 % (ref 4.6–6.5)

## 2014-08-05 NOTE — Telephone Encounter (Signed)
Patient had sleep study done at [facility] in [place].  She went to [facility] today to purchase supplies.  They informed her that as of 01/03/14, due to her insurance and the age of her CPAP machine (approx 55 years old), she will need to have another sleep study done and new machine.  Patient is calling to request this to be done.    Spoke with Dr. Lenord Fellers; she advised for patient to request her records from [facility].  Provided patient with [facility] [phone] and advised that she can contact them to set up an appointment and request sleep study.  Her insurance does not require a referral.  Patient will call them per Dr. Lenord Fellers and set up appointment.

## 2014-08-08 ENCOUNTER — Ambulatory Visit (INDEPENDENT_AMBULATORY_CARE_PROVIDER_SITE_OTHER): Payer: BLUE CROSS/BLUE SHIELD | Admitting: Endocrinology

## 2014-08-08 ENCOUNTER — Other Ambulatory Visit: Payer: Self-pay | Admitting: *Deleted

## 2014-08-08 ENCOUNTER — Encounter: Payer: Self-pay | Admitting: Endocrinology

## 2014-08-08 VITALS — BP 138/88 | HR 94 | Temp 97.7°F | Resp 16 | Ht 66.0 in | Wt 229.0 lb

## 2014-08-08 DIAGNOSIS — E119 Type 2 diabetes mellitus without complications: Secondary | ICD-10-CM | POA: Diagnosis not present

## 2014-08-08 DIAGNOSIS — I10 Essential (primary) hypertension: Secondary | ICD-10-CM | POA: Diagnosis not present

## 2014-08-08 MED ORDER — FUROSEMIDE 20 MG PO TABS: ORAL_TABLET | ORAL | Status: DC

## 2014-08-08 MED ORDER — GLUCOSE BLOOD VI STRP: ORAL_STRIP | Status: DC

## 2014-08-08 NOTE — Progress Notes (Signed)
Patient ID: Melissa Benson, female   DOB: Jul 07, 1960, 54 y.o.   MRN: [id number]   Reason for Appointment: Diabetes follow-up   History of Present Illness   Diagnosis: Type 2 DIABETES MELITUS, date of diagnosis: 2009      Previous history: She has been on various oral hypoglycemic drugs in the past and was on relatively large doses of basal insulin. With starting Victoza in 04/2011 she had drastically cut down her insulin requirement from her previous dosage of 80 units and had lost weight progressively also  Recent history:  Insulin regimen: 30 units Lantus in the evening  Although her A1c is relatively better at 6.1 she thinks that her blood sugars are higher both morning and evening She did not bring her glucose monitor for download today and her lab glucose was only 117 fasting She was switched from Victoza to [place] since she is reported in adequate satiety with Victoza but she does not feel any different; back she has gained weight with this change She has not changed her insulin dose since her last visit Not checking many readings after supper or bedtime Continues to take metformin ER She is  compliant with her evening Lantus after supper  Oral hypoglycemic drugs: Metformin ER 2 g    Side effects from medications: she may get diarrhea with regular metformin in the morning             Monitors blood glucose:  usually once a day.    Glucometer: One Touch.          Blood Glucose readings from recall:  PRE-MEAL Fasting Lunch Dinner Bedtime Overall  Glucose range: 130-150  155    Mean/median:         Hypoglycemia frequency:  none        Meals: 3 meals per day. Some salads at lunchotherwise sandwiches          Physical activity: exercise: Walking less recently Dietician visit:  last 2009          Wt Readings from Last 3 Encounters:  08/08/14 229 lb (103.874 kg)  07/11/14 228 lb (103.42 kg)  05/09/14 227 lb (102.967 kg)   LABS:  Lab Results  Component Value  Date   HGBA1C 6.1 08/05/2014   HGBA1C 6.5* 04/28/2014   HGBA1C 6.6* 04/04/2014   Lab Results  Component Value Date   MICROALBUR 0.2 05/01/2014   LDLCALC 37 04/28/2014   CREATININE 0.70 08/05/2014    Lab on 08/05/2014  Component Date Value Ref Range Status  . Hgb A1c MFr Bld 08/05/2014 6.1  4.6 - 6.5 % Final   Glycemic Control Guidelines for People with Diabetes:Non Diabetic:  <6%Goal of Therapy: <7%Additional Action Suggested:  >8%   . Sodium 08/05/2014 139  135 - 145 mEq/L Final  . Potassium 08/05/2014 4.1  3.5 - 5.1 mEq/L Final  . Chloride 08/05/2014 105  96 - 112 mEq/L Final  . CO2 08/05/2014 26  19 - 32 mEq/L Final  . Glucose, Bld 08/05/2014 117* 70 - 99 mg/dL Final  . BUN 40/98/1191 18  6 - 23 mg/dL Final  . Creatinine, Ser 08/05/2014 0.70  0.40 - 1.20 mg/dL Final  . Total Bilirubin 08/05/2014 0.5  0.2 - 1.2 mg/dL Final  . Alkaline Phosphatase 08/05/2014 39  39 - 117 U/L Final  . AST 08/05/2014 25  0 - 37 U/L Final  . ALT 08/05/2014 29  0 - 35 U/L Final  . Total Protein 08/05/2014  7.3  6.0 - 8.3 g/dL Final  . Albumin 16/10/9602 4.5  3.5 - 5.2 g/dL Final  . Calcium 54/09/8117 9.6  8.4 - 10.5 mg/dL Final  . GFR 14/78/2956 92.47  >60.00 mL/min Final      Medication List       This list is accurate as of: 08/08/14 12:48 PM.  Always use your most recent med list.               amLODipine 5 MG tablet  Commonly known as:  NORVASC  TAKE 1 TABLET BY MOUTH EVERY DAY     clotrimazole-betamethasone cream  Commonly known as:  LOTRISONE     CRESTOR 10 MG tablet  Generic drug:  rosuvastatin  TAKE 1 TABLET BY MOUTH EVERY DAY     cyclobenzaprine 10 MG tablet  Commonly known as:  FLEXERIL  Take 1 tablet (10 mg total) by mouth 3 (three) times daily as needed for muscle spasms.     FINACEA 15 % cream  Generic drug:  Azelaic Acid     furosemide 20 MG tablet  Commonly known as:  LASIX  Take 1 tablet as needed     glucose blood test strip  Commonly known as:  BAYER  CONTOUR NEXT TEST  Use as instructed to check blood sugar 3 times per day dx code E11.65     HYDROcodone-acetaminophen 10-325 MG per tablet  Commonly known as:  NORCO  Take 1 tablet by mouth every 8 (eight) hours as needed.     Insulin Pen Needle 32G X 5 MM Misc  Commonly known as:  NOVOTWIST  Use daily to inject saxenda     INVOKANA 300 MG Tabs tablet  Generic drug:  canagliflozin  TAKE 1 TABLET BY MOUTH EVERY DAY BEFORE BREAKFAST     LANTUS SOLOSTAR 100 UNIT/ML Solostar Pen  Generic drug:  Insulin Glargine  INJECT 50 UNITS UNDER THE SKIN EVERY DAY     Liraglutide -Weight Management 18 MG/3ML Sopn  Commonly known as:  SAXENDA  Inject 3 mg into the skin daily.     meclizine 25 MG tablet  Commonly known as:  ANTIVERT  TAKE 1 TABLET BY MOUTH THREE TIMES DAILY AS NEEDED     metFORMIN 500 MG 24 hr tablet  Commonly known as:  GLUCOPHAGE-XR  TAKE 4 TABLETS BY MOUTH EVERY DAY WITH SUPPER     NON FORMULARY  CPAP     promethazine 25 MG tablet  Commonly known as:  PHENERGAN  TAKE 1 TABLET BY MOUTH EVERY 4 HOURS AS NEEDED FOR PAIN     ramipril 10 MG capsule  Commonly known as:  ALTACE  TAKE 1 CAPSULE BY MOUTH EVERY DAY     terconazole 0.4 % vaginal cream  Commonly known as:  TERAZOL 7  Place 1 applicator vaginally at bedtime.     triamcinolone cream 0.1 %  Commonly known as:  KENALOG  Apply 1 application topically 3 (three) times daily.     valACYclovir 500 MG tablet  Commonly known as:  VALTREX  One po bid x 5 days for Herpes Simplex Type I     Vitamin D (Ergocalciferol) 50000 UNITS Caps capsule  Commonly known as:  DRISDOL  TAKE ONE CAPSULE BY MOUTH EVERY 7 DAYS        Allergies: No Known Allergies  Past Medical History  Diagnosis Date  . Hypertension   . Rosacea   . Psoriasis   . Diabetes mellitus   . OSA (  obstructive sleep apnea)     on CPAP  . History of migraine headaches   . Neck pain, chronic   . Hyperlipidemia     Past Surgical History    Procedure Laterality Date  . Cholecystectomy    . Appendectomy    . Tubal ligation    . Abdominal hysterectomy    . Spine surgery      C6-C7 fusion x2  . Spine surgery      herniated disc L4-L5  . Cardiovascular stress test  07/20/2011    Normal myocardial perfusion study, EKG negative for ischemia, no ECG changes  . 2d echocardiogram  07/20/2011    EF >55%, normal    Family History  Problem Relation Age of Onset  . Diabetes Mother   . Heart disease Mother   . Hypertension Mother   . Lung disease Father   . Heart disease Father   . Hypertension Father   . Diabetes Father   . Hypertension Sister   . Hyperlipidemia Brother   . Hypertension Brother   . Heart attack Maternal Grandmother   . Heart disease Maternal Grandmother   . Hypertension Maternal Grandfather   . Diabetes Maternal Grandfather   . Heart disease Maternal Grandfather     Social History:  reports that she quit smoking about 31 years ago. Her smoking use included Cigarettes. She has a 20 pack-year smoking history. She has never used smokeless tobacco. She reports that she does not drink alcohol. Her drug history is not on file.  Review of Systems:  Hypertension:  currently being treated with ramipril and amlodipine with good control. Home BP Usually near 120/80  Lipids: She is on Crestor for cardiovascular protection but takes half tablet daily   Lab Results  Component Value Date   CHOL 99 04/28/2014   HDL 48 04/28/2014   LDLCALC 37 04/28/2014   TRIG 69 04/28/2014   CHOLHDL 2.1 04/28/2014    Taking Vitamin D weekly as a supplement from PCP      Examination:   BP 138/88 mmHg  Pulse 94  Temp(Src) 97.7 F (36.5 C)  Resp 16  Ht 5\' 6"  (1.676 m)  Wt 229 lb (103.874 kg)  BMI 36.98 kg/m2  SpO2 97%  Body mass index is 36.98 kg/(m^2).    ASSESSMENT/ PLAN:   Diabetes type 2   Blood glucose control is fairly good with upper normal A1c again Although she has good A1c and near normal fasting lab  glucose she thinks her blood sugars are higher Also she has gained weight despite searching Victoza to Saxenda Generally has difficulty losing weight because of various factors including inconsistent exercise, stress eating  Recommendations today:  She will try to get exercise started  Since she is not benefiting from [place] she can use 1.8 mg Victoza or Saxenda  She is going to look into bariatric surgery and given names of surgeons  Try to check blood sugars consistently at various times including after meals  Hypertension: Continue follow-up with PCP and monitor at home  History of occasional edema: She is requesting prescription for Lasix to take occasionally, 20 mg tablets, 15 given  Patient Instructions  Check blood sugars on waking up .Marland Kitchen2-3  .Marland Kitchen times a week Also check blood sugars about 2 hours after a meal and do this after different meals by rotation  Recommended blood sugar levels on waking up is 90-130 and about 2 hours after meal is 140-180 Please bring blood sugar monitor to each  visit.  Check with Dr Wenda Low, DEzzard Standing  Victoza 1.8mg  daily     [facility] 08/08/2014, 12:48 PM

## 2014-08-08 NOTE — Patient Instructions (Addendum)
Check blood sugars on waking up .Marland Kitchen2-3  .Marland Kitchen times a week Also check blood sugars about 2 hours after a meal and do this after different meals by rotation  Recommended blood sugar levels on waking up is 90-130 and about 2 hours after meal is 140-180 Please bring blood sugar monitor to each visit.  Check with Dr Wenda Low, Raelyn Mora  Victoza 1.8mg  daily

## 2014-09-01 ENCOUNTER — Other Ambulatory Visit: Payer: Self-pay | Admitting: Endocrinology

## 2014-09-25 ENCOUNTER — Ambulatory Visit (INDEPENDENT_AMBULATORY_CARE_PROVIDER_SITE_OTHER): Payer: BLUE CROSS/BLUE SHIELD | Admitting: Pulmonary Disease

## 2014-09-25 ENCOUNTER — Encounter: Payer: Self-pay | Admitting: Pulmonary Disease

## 2014-09-25 VITALS — BP 130/74 | HR 81 | Temp 98.6°F | Ht 66.0 in | Wt 232.6 lb

## 2014-09-25 DIAGNOSIS — G4730 Sleep apnea, unspecified: Secondary | ICD-10-CM | POA: Diagnosis not present

## 2014-09-25 DIAGNOSIS — G4733 Obstructive sleep apnea (adult) (pediatric): Secondary | ICD-10-CM | POA: Diagnosis not present

## 2014-09-25 DIAGNOSIS — Z9989 Dependence on other enabling machines and devices: Secondary | ICD-10-CM

## 2014-09-25 DIAGNOSIS — E669 Obesity, unspecified: Secondary | ICD-10-CM

## 2014-09-25 NOTE — Patient Instructions (Signed)
You have mild obstructive sleep apnea  We will send in rx for new CPAP & supplies to [facility] If new study required , we will set up Weight loss advised

## 2014-09-25 NOTE — Progress Notes (Signed)
**Note Melissa-Identified via Obfuscation** Subjective:    Patient ID: Melissa Benson, female    DOB: Sep 22, 1960, 54 y.o.   MRN: [id number]  HPI  Chief Complaint  Patient presents with  . Sleep Consult    pt states she has been diagnosed with sleep apnea about 10 years ago and currently has a CPAP but needs a new one. pt c/o snoring. pt has not has a recent sleep study. pt uses CPAP every night for about 7 - 8 hours. Epworth Score : 79   54 year old remote smoker presents for management of OSA. She was diagnosed over 10 years ago. PSG 12/2002-weight 240 pounds-showed RDI of 9/hour This was corrected by CPAP 8 cm on a titration study in 01/2003.  She has been maintained on nasal pillows since then with good results and improvement in daytime fatigue and refreshing sleep. She's gained about 10 pounds in the last 2 years. She needs new supplies and was told that she needs a compliance report prior. A download cannot be obtained from her current machine. She also desires a new machine. She is trying to lose weight and is also exploring the possibility of bariatric surgery. She has other qualifying diagnosis such as hypertension and diabetes. Epworth sleepiness score is 9 Bedtime is around 10 PM, the TV stays on a timer, sleep latency is 30 minutes to an hour, she sleeps on her back with 2 pillows, reports 4-5 nocturnal awakenings including nocturia and is out of bed by 6:30 AM with dryness of mouth but denies headaches There is no history suggestive of cataplexy, sleep paralysis or parasomnias  Past Medical History  Diagnosis Date  . Hypertension   . Rosacea   . Psoriasis   . Diabetes mellitus   . OSA (obstructive sleep apnea)     on CPAP  . History of migraine headaches   . Neck pain, chronic   . Hyperlipidemia    Past Surgical History  Procedure Laterality Date  . Cholecystectomy    . Appendectomy    . Tubal ligation    . Abdominal hysterectomy    . Spine surgery      C6-C7 fusion x2  . Spine surgery      herniated  disc L4-L5  . Cardiovascular stress test  07/20/2011    Normal myocardial perfusion study, EKG negative for ischemia, no ECG changes  . 2d echocardiogram  07/20/2011    EF >55%, normal    No Known Allergies  Social History   Social History  . Marital Status: Married    Spouse Name: N/A  . Number of Children: N/A  . Years of Education: N/A   Occupational History  . Not on file.   Social History Main Topics  . Smoking status: Former Smoker -- 2.50 packs/day for 8 years    Types: Cigarettes    Quit date: 05/18/1983  . Smokeless tobacco: Never Used  . Alcohol Use: No  . Drug Use: Not on file  . Sexual Activity: Not on file   Other Topics Concern  . Not on file   Social History Narrative    Family History  Problem Relation Age of Onset  . Diabetes Mother   . Heart disease Mother   . Hypertension Mother   . Lung disease Father   . Heart disease Father   . Hypertension Father   . Diabetes Father   . Hypertension Sister   . Hyperlipidemia Brother   . Hypertension Brother   . Heart attack Maternal Grandmother   .  Heart disease Maternal Grandmother   . Hypertension Maternal Grandfather   . Diabetes Maternal Grandfather   . Heart disease Maternal Grandfather       Review of Systems  Constitutional: Negative for fever and unexpected weight change.  HENT: Negative for congestion, dental problem, ear pain, nosebleeds, postnasal drip, rhinorrhea, sinus pressure, sneezing, sore throat and trouble swallowing.   Eyes: Negative for redness and itching.  Respiratory: Negative for cough, chest tightness, shortness of breath and wheezing.   Cardiovascular: Negative for palpitations and leg swelling.  Gastrointestinal: Negative for nausea and vomiting.  Genitourinary: Negative for dysuria.  Musculoskeletal: Negative for joint swelling.  Skin: Negative for rash.  Neurological: Negative for headaches.  Hematological: Does not bruise/bleed easily.  Psychiatric/Behavioral:  Negative for dysphoric mood. The patient is not nervous/anxious.        Objective:   Physical Exam  Gen. Pleasant, obese, in no distress, normal affect ENT - no lesions, no post nasal drip, class 2-3 airway Neck: No JVD, no thyromegaly, no carotid bruits Lungs: no use of accessory muscles, no dullness to percussion, decreased without rales or rhonchi  Cardiovascular: Rhythm regular, heart sounds  normal, no murmurs or gallops, no peripheral edema Abdomen: soft and non-tender, no hepatosplenomegaly, BS normal. Musculoskeletal: No deformities, no cyanosis or clubbing Neuro:  alert, non focal, no tremors       Assessment & Plan:

## 2014-09-25 NOTE — Assessment & Plan Note (Addendum)
mild obstructive sleep apnea -certainly if she lost enough weight, this condition could resolve We will send in rx for new CPAP & supplies to [facility] If new study required , we will set up CPAP titration study   Weight loss encouraged, compliance with goal of at least 4-6 hrs every night is the expectation. Advised against medications with sedative side effects Cautioned against driving when sleepy - understanding that sleepiness will vary on a day to day basis

## 2014-09-25 NOTE — Assessment & Plan Note (Signed)
OSA could be a qualifying condition for bariatric surgery

## 2014-09-29 ENCOUNTER — Other Ambulatory Visit: Payer: Self-pay | Admitting: Internal Medicine

## 2014-10-06 ENCOUNTER — Other Ambulatory Visit: Payer: Self-pay | Admitting: Internal Medicine

## 2014-10-06 NOTE — Telephone Encounter (Signed)
Please call pt. Need to know why she needs this. Last prescribed May 2016.

## 2014-10-11 ENCOUNTER — Ambulatory Visit (INDEPENDENT_AMBULATORY_CARE_PROVIDER_SITE_OTHER): Payer: BLUE CROSS/BLUE SHIELD | Admitting: Physician Assistant

## 2014-10-11 VITALS — BP 122/74 | HR 117 | Temp 99.1°F | Resp 18 | Ht 66.0 in | Wt 231.0 lb

## 2014-10-11 DIAGNOSIS — E119 Type 2 diabetes mellitus without complications: Secondary | ICD-10-CM | POA: Diagnosis not present

## 2014-10-11 DIAGNOSIS — R350 Frequency of micturition: Secondary | ICD-10-CM | POA: Diagnosis not present

## 2014-10-11 DIAGNOSIS — R300 Dysuria: Secondary | ICD-10-CM | POA: Diagnosis not present

## 2014-10-11 LAB — POCT URINALYSIS DIP (MANUAL ENTRY)
BILIRUBIN UA: NEGATIVE
BILIRUBIN UA: NEGATIVE
Nitrite, UA: NEGATIVE
Protein Ur, POC: NEGATIVE
SPEC GRAV UA: 1.015
Urobilinogen, UA: 0.2
pH, UA: 6

## 2014-10-11 LAB — POC MICROSCOPIC URINALYSIS (UMFC): MUCUS RE: ABSENT

## 2014-10-11 MED ORDER — SULFAMETHOXAZOLE-TRIMETHOPRIM 800-160 MG PO TABS: 1.0000 | ORAL_TABLET | Freq: Two times a day (BID) | ORAL | Status: DC

## 2014-10-11 NOTE — Patient Instructions (Addendum)
Please take the bactrim twice daily for 5 days.  I've sent a culture to look further into the agent causing the UTI.  If you start having fevers, chills, abdominal pain, or flank pain please be sure to come back to be seen.  You had some sugar in your urine, be sure to follow up with your primary doctor about your diabetes.   Urinary Tract Infection Urinary tract infections (UTIs) can develop anywhere along your urinary tract. Your urinary tract is your body's drainage system for removing wastes and extra water. Your urinary tract includes two kidneys, two ureters, a bladder, and a urethra. Your kidneys are a pair of bean-shaped organs. Each kidney is about the size of your fist. They are located below your ribs, one on each side of your spine. CAUSES Infections are caused by microbes, which are microscopic organisms, including fungi, viruses, and bacteria. These organisms are so small that they can only be seen through a microscope. Bacteria are the microbes that most commonly cause UTIs. SYMPTOMS  Symptoms of UTIs may vary by age and gender of the patient and by the location of the infection. Symptoms in young women typically include a frequent and intense urge to urinate and a painful, burning feeling in the bladder or urethra during urination. Older women and men are more likely to be tired, shaky, and weak and have muscle aches and abdominal pain. A fever may mean the infection is in your kidneys. Other symptoms of a kidney infection include pain in your back or sides below the ribs, nausea, and vomiting. DIAGNOSIS To diagnose a UTI, your caregiver will ask you about your symptoms. Your caregiver will also ask you to provide a urine sample. The urine sample will be tested for bacteria and white blood cells. White blood cells are made by your body to help fight infection. TREATMENT  Typically, UTIs can be treated with medication. Because most UTIs are caused by a bacterial infection, they usually  can be treated with the use of antibiotics. The choice of antibiotic and length of treatment depend on your symptoms and the type of bacteria causing your infection. HOME CARE INSTRUCTIONS  If you were prescribed antibiotics, take them exactly as your caregiver instructs you. Finish the medication even if you feel better after you have only taken some of the medication.  Drink enough water and fluids to keep your urine clear or pale yellow.  Avoid caffeine, tea, and carbonated beverages. They tend to irritate your bladder.  Empty your bladder often. Avoid holding urine for long periods of time.  Empty your bladder before and after sexual intercourse.  After a bowel movement, women should cleanse from front to back. Use each tissue only once. SEEK MEDICAL CARE IF:   You have back pain.  You develop a fever.  Your symptoms do not begin to resolve within 3 days. SEEK IMMEDIATE MEDICAL CARE IF:   You have severe back pain or lower abdominal pain.  You develop chills.  You have nausea or vomiting.  You have continued burning or discomfort with urination. MAKE SURE YOU:   Understand these instructions.  Will watch your condition.  Will get help right away if you are not doing well or get worse.   This information is not intended to replace advice given to you by your health care provider. Make sure you discuss any questions you have with your health care provider.   Document Released: 09/29/2004 Document Revised: 09/10/2014 Document Reviewed: 01/28/2011 Elsevier Interactive  Patient Education 2016 [employer].

## 2014-10-11 NOTE — Progress Notes (Signed)
   Subjective:    Patient ID: ARDITH TEST, female    DOB: 1960-01-24, 54 y.o.   MRN: [id number]  Chief Complaint  Patient presents with  . Increased frequency    X Monday  . Burning with urination    X Monday   Medications, allergies, past medical history, surgical history, family history, social history and problem list reviewed and updated.  HPI  28 yof presents with 4 day h/o dysuria and increased freq past 4 days.   Denies fevers. Slight chills yest. Denies low back pain or abd pain. Has DMII. Last A1C 6.1 2 months ago.   Review of Systems See HPI     Objective:   Physical Exam  Constitutional: She is oriented to person, place, and time. She appears well-developed and well-nourished.  Non-toxic appearance. She does not have a sickly appearance. She does not appear ill. No distress.  BP 122/74 mmHg  Pulse 117  Temp(Src) 99.1 F (37.3 C) (Oral)  Resp 18  Ht  (1.676 m)  Wt 231 lb (104.781 kg)  BMI 37.30 kg/m2  SpO2 98%   Abdominal: There is no tenderness. There is no CVA tenderness.  Neurological: She is alert and oriented to person, place, and time.  Psychiatric: She has a normal mood and affect. Her speech is normal and behavior is normal.   Results for orders placed or performed in visit on 10/11/14  POCT urinalysis dipstick  Result Value Ref Range   Color, UA yellow yellow   Clarity, UA clear clear   Glucose, UA =500 (A) negative   Bilirubin, UA negative negative   Ketones, POC UA negative negative   Spec Grav, UA 1.015    Blood, UA trace-intact (A) negative   pH, UA 6.0    Protein Ur, POC negative negative   Urobilinogen, UA 0.2    Nitrite, UA Negative Negative   Leukocytes, UA Trace (A) Negative  POCT Microscopic Urinalysis ([facility])  Result Value Ref Range   WBC,UR,HPF,POC Moderate (A) None WBC/hpf   RBC,UR,HPF,POC Few (A) None RBC/hpf   Bacteria Many (A) None   Mucus Absent Absent   Epithelial Cells, UR Per Microscopy Few (A) None cells/hpf       Assessment & Plan:   Burning with urination - Plan: POCT urinalysis dipstick, POCT Microscopic Urinalysis ([facility]), Urine culture, sulfamethoxazole-trimethoprim (BACTRIM DS,SEPTRA DS) 800-160 MG tablet  Increased frequency of urination - Plan: POCT urinalysis dipstick, POCT Microscopic Urinalysis ([facility])  Controlled type 2 diabetes mellitus without complication, without long-term current use of insulin (HCC) --trace leuks trace blood on ua, no nitrites  --bactrim as she states she was resistant to macrobid in the past, culture sent --glucosuria on ua, pt dm2 and on SGLT2 inhibitor  --rtc with fevers, chills, abd pain, flank pain  Donnajean Lopes, PA-C Physician Assistant-Certified [facility]  10/11/2014 3:25 PM

## 2014-10-13 LAB — URINE CULTURE: Colony Count: >=100000

## 2014-10-27 ENCOUNTER — Other Ambulatory Visit: Payer: Self-pay | Admitting: Internal Medicine

## 2014-10-27 ENCOUNTER — Telehealth: Payer: Self-pay | Admitting: Endocrinology

## 2014-10-27 NOTE — Telephone Encounter (Signed)
Patient is going over to Dr Lenord FellersBaxley office tomorrow to get her labs drawn, please send lab order to her office, Phone # [phone]  Fax # [phone]   [address] [place] [phone]

## 2014-10-27 NOTE — Telephone Encounter (Signed)
Orders faxed

## 2014-10-28 ENCOUNTER — Other Ambulatory Visit: Payer: Self-pay

## 2014-10-28 ENCOUNTER — Other Ambulatory Visit (INDEPENDENT_AMBULATORY_CARE_PROVIDER_SITE_OTHER): Payer: BLUE CROSS/BLUE SHIELD | Admitting: Internal Medicine

## 2014-10-28 DIAGNOSIS — Z0000 Encounter for general adult medical examination without abnormal findings: Secondary | ICD-10-CM

## 2014-10-28 DIAGNOSIS — E8881 Metabolic syndrome: Secondary | ICD-10-CM

## 2014-10-28 DIAGNOSIS — Z1329 Encounter for screening for other suspected endocrine disorder: Secondary | ICD-10-CM

## 2014-10-28 DIAGNOSIS — E669 Obesity, unspecified: Secondary | ICD-10-CM

## 2014-10-28 DIAGNOSIS — E785 Hyperlipidemia, unspecified: Secondary | ICD-10-CM

## 2014-10-28 DIAGNOSIS — I10 Essential (primary) hypertension: Secondary | ICD-10-CM

## 2014-10-28 DIAGNOSIS — Z87440 Personal history of urinary (tract) infections: Secondary | ICD-10-CM | POA: Diagnosis not present

## 2014-10-28 DIAGNOSIS — E119 Type 2 diabetes mellitus without complications: Secondary | ICD-10-CM

## 2014-10-28 LAB — CBC WITH DIFFERENTIAL/PLATELET
BASOS PCT: 0 % (ref 0–1)
Basophils Absolute: 0 10*3/uL (ref 0.0–0.1)
EOS ABS: 0 10*3/uL (ref 0.0–0.7)
Eosinophils Relative: 1 % (ref 0–5)
HCT: 38.2 % (ref 36.0–46.0)
Hemoglobin: 12.4 g/dL (ref 12.0–15.0)
Lymphocytes Relative: 36 % (ref 12–46)
Lymphs Abs: 1.6 10*3/uL (ref 0.7–4.0)
MCH: 28.4 pg (ref 26.0–34.0)
MCHC: 32.5 g/dL (ref 30.0–36.0)
MCV: 87.6 fL (ref 78.0–100.0)
MONO ABS: 0.4 10*3/uL (ref 0.1–1.0)
MONOS PCT: 8 % (ref 3–12)
MPV: 8.7 fL (ref 8.6–12.4)
Neutro Abs: 2.5 10*3/uL (ref 1.7–7.7)
Neutrophils Relative %: 55 % (ref 43–77)
PLATELETS: 262 10*3/uL (ref 150–400)
RBC: 4.36 MIL/uL (ref 3.87–5.11)
RDW: 14.3 % (ref 11.5–15.5)
WBC: 4.5 10*3/uL (ref 4.0–10.5)

## 2014-10-28 LAB — POCT URINALYSIS DIPSTICK
BILIRUBIN UA: NEGATIVE
Blood, UA: NEGATIVE
Glucose, UA: POSITIVE
Ketones, UA: NEGATIVE
LEUKOCYTES UA: NEGATIVE
NITRITE UA: NEGATIVE
PH UA: 5
Protein, UA: NEGATIVE
Spec Grav, UA: 1.01
UROBILINOGEN UA: NEGATIVE

## 2014-10-28 LAB — TSH: TSH: 1.723 u[IU]/mL (ref 0.350–4.500)

## 2014-10-28 MED ORDER — ROSUVASTATIN CALCIUM 10 MG PO TABS: 10.0000 mg | ORAL_TABLET | Freq: Every day | ORAL | Status: DC

## 2014-10-28 NOTE — Addendum Note (Signed)
Addended by: Judd GaudierLEVENS, SHANNON M on: 10/28/2014 12:31 PM   Modules accepted: Orders

## 2014-10-29 ENCOUNTER — Other Ambulatory Visit (HOSPITAL_COMMUNITY): Payer: Self-pay | Admitting: Surgery

## 2014-10-29 LAB — HEMOGLOBIN A1C
Hgb A1c MFr Bld: 6.6 % — ABNORMAL HIGH (ref <5.7)
Mean Plasma Glucose: 143 mg/dL — ABNORMAL HIGH (ref <117)

## 2014-10-29 LAB — COMPLETE METABOLIC PANEL WITH GFR
ALK PHOS: 41 U/L (ref 33–130)
ALT: 34 U/L — ABNORMAL HIGH (ref 6–29)
AST: 34 U/L (ref 10–35)
Albumin: 4.4 g/dL (ref 3.6–5.1)
BUN: 15 mg/dL (ref 7–25)
CO2: 25 mmol/L (ref 20–31)
Calcium: 10 mg/dL (ref 8.6–10.4)
Chloride: 104 mmol/L (ref 98–110)
Creat: 0.74 mg/dL (ref 0.50–1.05)
GFR, Est African American: >89 mL/min (ref >=60)
GLUCOSE: 121 mg/dL — AB (ref 65–99)
POTASSIUM: 4.8 mmol/L (ref 3.5–5.3)
SODIUM: 140 mmol/L (ref 135–146)
Total Bilirubin: 0.5 mg/dL (ref 0.2–1.2)
Total Protein: 7.1 g/dL (ref 6.1–8.1)

## 2014-10-29 LAB — LIPID PANEL
CHOL/HDL RATIO: 2.4 ratio (ref <=5.0)
Cholesterol: 97 mg/dL — ABNORMAL LOW (ref 125–200)
HDL: 40 mg/dL — AB (ref >=46)
LDL CALC: 33 mg/dL (ref <130)
Triglycerides: 120 mg/dL (ref <150)
VLDL: 24 mg/dL (ref <30)

## 2014-10-29 LAB — MICROALBUMIN, URINE: MICROALB UR: 0.2 mg/dL

## 2014-10-29 LAB — VITAMIN D 25 HYDROXY (VIT D DEFICIENCY, FRACTURES): Vit D, 25-Hydroxy: 59 ng/mL (ref 30–100)

## 2014-10-30 ENCOUNTER — Encounter: Payer: BLUE CROSS/BLUE SHIELD | Admitting: Internal Medicine

## 2014-10-31 ENCOUNTER — Other Ambulatory Visit (HOSPITAL_COMMUNITY): Payer: Self-pay | Admitting: Surgery

## 2014-11-03 ENCOUNTER — Ambulatory Visit (INDEPENDENT_AMBULATORY_CARE_PROVIDER_SITE_OTHER): Payer: BLUE CROSS/BLUE SHIELD | Admitting: Internal Medicine

## 2014-11-03 VITALS — BP 110/73 | HR 90 | Temp 98.5°F | Resp 17 | Ht 65.0 in | Wt 226.0 lb

## 2014-11-03 DIAGNOSIS — R300 Dysuria: Secondary | ICD-10-CM

## 2014-11-03 DIAGNOSIS — R350 Frequency of micturition: Secondary | ICD-10-CM

## 2014-11-03 DIAGNOSIS — R3915 Urgency of urination: Secondary | ICD-10-CM | POA: Diagnosis not present

## 2014-11-03 DIAGNOSIS — IMO0001 Reserved for inherently not codable concepts without codable children: Secondary | ICD-10-CM

## 2014-11-03 LAB — POCT URINALYSIS DIP (MANUAL ENTRY)
Bilirubin, UA: NEGATIVE
NITRITE UA: POSITIVE — AB
SPEC GRAV UA: 1.02
UROBILINOGEN UA: 0.2
pH, UA: 6

## 2014-11-03 LAB — POC MICROSCOPIC URINALYSIS (UMFC)

## 2014-11-03 MED ORDER — SULFAMETHOXAZOLE-TRIMETHOPRIM 800-160 MG PO TABS: 1.0000 | ORAL_TABLET | Freq: Two times a day (BID) | ORAL | Status: DC

## 2014-11-03 MED ORDER — FLUCONAZOLE 150 MG PO TABS: ORAL_TABLET | ORAL | Status: DC

## 2014-11-03 NOTE — Progress Notes (Signed)
Subjective:  This chart was scribed for Melissa Siaobert Shamell Suarez, MD by Andrew Auaven Small, ED Scribe. This patient was seen in room 12 and the patient's care was started at 9:04 AM.   Patient ID: Melissa Benson, female    DOB: July 18, 1960, 54 y.o.   MRN: [id number]  HPI Chief Complaint  Patient presents with  . Urinary Tract Infection  . Vaginitis  . Nasal Congestion   HPI Comments: Melissa Benson is a 54 y.o. female who presents to the [facility] complaining of a UTI. She was seen here 3 weeks ago for dysuria and increased urination and was treated with sulfa. She was starting to get better but symptoms returned last week with dysuria, frequency, urgency, vaginal discharge, and nausea.  No lower back pain or abdominal pain .   She also complains of nasal congestion, cough and sneezing. Congestion is worse at night. She uses CPAP which tends to clear her up. Has had sick contacts.   Past Medical History  Diagnosis Date  . Hypertension   . Rosacea   . Psoriasis   . Diabetes mellitus   . OSA (obstructive sleep apnea)     on CPAP  . History of migraine headaches   . Neck pain, chronic   . Hyperlipidemia    Prior to Admission medications   Medication Sig Start Date End Date Taking? Authorizing Provider  amLODipine (NORVASC) 5 MG tablet TAKE 1 TABLET BY MOUTH EVERY DAY 09/02/14  Yes Reather LittlerAjay Kumar, MD  furosemide (LASIX) 20 MG tablet Take 1 tablet as needed 08/08/14  Yes Reather LittlerAjay Kumar, MD  glucose blood (BAYER CONTOUR NEXT TEST) test strip Use as instructed to check blood sugar 3 times per day dx code E11.65 08/08/14  Yes Reather LittlerAjay Kumar, MD  HYDROcodone-acetaminophen (NORCO) 10-325 MG per tablet Take 1 tablet by mouth every 8 (eight) hours as needed. 07/10/14  Yes Margaree MackintoshMary J Baxley, MD  Insulin Pen Needle (NOVOTWIST) 32G X 5 MM MISC Use daily to inject saxenda 04/08/14  Yes Reather LittlerAjay Kumar, MD  LANTUS SOLOSTAR 100 UNIT/ML Solostar Pen ADMINISTER 50 UNITS UNDER THE SKIN EVERY DAY 09/02/14  Yes Reather LittlerAjay  Kumar, MD  metFORMIN (GLUCOPHAGE-XR) 500 MG 24 hr tablet TAKE 4 TABLETS BY MOUTH EVERY DAY WITH DINNER 09/02/14  Yes Reather LittlerAjay Kumar, MD  promethazine (PHENERGAN) 25 MG tablet TAKE 1 TABLET BY MOUTH EVERY 4 HOURS AS NEEDED FOR PAIN 10/27/14  Yes Margaree MackintoshMary J [place], MD  ramipril (ALTACE) 10 MG capsule TAKE ONE CAPSULE BY MOUTH EVERY DAY. 09/29/14  Yes Margaree MackintoshMary J [place], MD  rosuvastatin (CRESTOR) 10 MG tablet Take 1 tablet (10 mg total) by mouth daily. 10/28/14  Yes Margaree MackintoshMary J Baxley, MD  sulfamethoxazole-trimethoprim (BACTRIM DS,SEPTRA DS) 800-160 MG tablet Take 1 tablet by mouth 2 (two) times daily. 10/11/14  Yes Todd McVeigh, PA  terconazole (TERAZOL 7) 0.4 % vaginal cream Place 1 applicator vaginally at bedtime. 05/09/14  Yes Margaree MackintoshMary J [place], MD  VICTOZA 18 MG/3ML SOPN  09/01/14  Yes Historical Provider, MD  Vitamin D, Ergocalciferol, (DRISDOL) 50000 UNITS CAPS capsule TAKE ONE CAPSULE BY MOUTH EVERY 7 DAYS 01/09/14  Yes Reather LittlerAjay Kumar, MD  INVOKANA 300 MG TABS tablet TAKE 1 TABLET BY MOUTH EVERY DAY BEFORE BREAKFAST Patient not taking: Reported on 11/03/2014 05/27/14   Reather LittlerAjay Kumar, MD  meclizine (ANTIVERT) 25 MG tablet TAKE 1 TABLET BY MOUTH THREE TIMES DAILY AS NEEDED Patient not taking: Reported on 10/11/2014 11/14/13   Margaree MackintoshMary J [place], MD  NON FORMULARY CPAP    Historical Provider, MD  triamcinolone cream (KENALOG) 0.1 % Apply 1 application topically 3 (three) times daily. Patient not taking: Reported on 11/03/2014 06/18/13   Margaree Mackintosh, MD  valACYclovir (VALTREX) 500 MG tablet One po bid x 5 days for Herpes Simplex Type I Patient not taking: Reported on 10/11/2014 07/11/14   Margaree Mackintosh, MD   Review of Systems  HENT: Positive for congestion and sneezing.   Respiratory: Positive for cough.   Gastrointestinal: Positive for nausea. Negative for vomiting and abdominal pain.  Genitourinary: Positive for dysuria, urgency, frequency and vaginal discharge. Negative for flank pain.  Musculoskeletal: Negative for back pain.       Objective:   Physical Exam  Constitutional: She is oriented to person, place, and time. She appears well-developed and well-nourished. No distress.  HENT:  Head: Normocephalic and atraumatic.  Eyes: Conjunctivae and EOM are normal.  Neck: Neck supple.  Cardiovascular: Normal rate, regular rhythm and normal heart sounds.   No murmur heard. Pulmonary/Chest: Effort normal and breath sounds normal. She has no wheezes. She has no rales.  Abdominal:  No CVA tenderness   Musculoskeletal: Normal range of motion.  Neurological: She is alert and oriented to person, place, and time.  Skin: Skin is warm and dry.  Psychiatric: She has a normal mood and affect. Her behavior is normal.  Nursing note and vitals reviewed.   Filed Vitals:   11/03/14 0855  BP: 110/73  Pulse: 90  Temp: 98.5 F (36.9 C)  TempSrc: Oral  Resp: 17  Height:  (1.651 m)  Weight: 226 lb (102.513 kg)  SpO2: 97%   Results for orders placed or performed in visit on 11/03/14  POCT urinalysis dipstick  Result Value Ref Range   Color, UA orange (A) yellow   Clarity, UA cloudy (A) clear   Glucose, UA >=1,000 (A) negative   Bilirubin, UA negative negative   Ketones, POC UA trace (5) (A) negative   Spec Grav, UA 1.020    Blood, UA trace-lysed (A) negative   pH, UA 6.0    Protein Ur, POC =30 (A) negative   Urobilinogen, UA 0.2    Nitrite, UA Positive (A) Negative   Leukocytes, UA Trace (A) Negative  POCT Microscopic Urinalysis ([facility])  Result Value Ref Range   WBC,UR,HPF,POC Moderate (A) None WBC/hpf   RBC,UR,HPF,POC Moderate (A) None RBC/hpf   Bacteria Few (A) None, Too numerous to count   Mucus Present (A) Absent   Epithelial Cells, UR Per Microscopy Few (A) None, Too numerous to count cells/hpf   Assessment & Plan:   1. Dysuria   2. Frequency   3. Urgency of urination   4.      UTI-relapse vs recurrence--  Orders Placed This Encounter  Procedures  . Urine culture  . POCT urinalysis dipstick  .  POCT Microscopic Urinalysis ([facility])    Meds ordered this encounter  Medications  . sulfamethoxazole-trimethoprim (BACTRIM DS,SEPTRA DS) 800-160 MG tablet    Sig: Take 1 tablet by mouth 2 (two) times daily.    Dispense:  20 tablet    Refill:  0  . fluconazole (DIFLUCAN) 150 MG tablet    Sig: Take one today, and a second one while on antibiotics if symptoms return. Take third tab after completing antibiotics.    Dispense:  3 tablet    Refill:  0  ---because these UTIs have incr in freq and she has hx of incr stress incontinence,  she will consider f/u Dr Dareen Piano for eval poss ut suspension. Also considering gastric bypass.  By signing my name below, I, Raven Small, attest that this documentation has been prepared under the direction and in the presence of Melissa Sia, MD.  Electronically Signed: Andrew Au, ED Scribe. 11/03/2014. 9:39 AM. I have completed the patient encounter in its entirety as documented by the scribe, with editing by me where necessary. Javarius Tsosie P. Merla Riches, M.D.

## 2014-11-04 ENCOUNTER — Other Ambulatory Visit: Payer: BLUE CROSS/BLUE SHIELD

## 2014-11-05 LAB — URINE CULTURE: Colony Count: >=100000

## 2014-11-06 ENCOUNTER — Encounter: Payer: Self-pay | Admitting: Endocrinology

## 2014-11-06 ENCOUNTER — Ambulatory Visit (INDEPENDENT_AMBULATORY_CARE_PROVIDER_SITE_OTHER): Payer: BLUE CROSS/BLUE SHIELD | Admitting: Endocrinology

## 2014-11-06 VITALS — BP 134/88 | HR 81 | Temp 98.3°F | Resp 16 | Ht 65.0 in | Wt 229.6 lb

## 2014-11-06 DIAGNOSIS — Z794 Long term (current) use of insulin: Secondary | ICD-10-CM | POA: Diagnosis not present

## 2014-11-06 DIAGNOSIS — I10 Essential (primary) hypertension: Secondary | ICD-10-CM

## 2014-11-06 DIAGNOSIS — E1165 Type 2 diabetes mellitus with hyperglycemia: Secondary | ICD-10-CM | POA: Diagnosis not present

## 2014-11-06 DIAGNOSIS — Z7689 Persons encountering health services in other specified circumstances: Secondary | ICD-10-CM

## 2014-11-06 DIAGNOSIS — E785 Hyperlipidemia, unspecified: Secondary | ICD-10-CM | POA: Diagnosis not present

## 2014-11-06 NOTE — Patient Instructions (Signed)
Leave off [employer]

## 2014-11-06 NOTE — Progress Notes (Signed)
Patient ID: [name], female   DOB: May 12, 1960, 54 y.o.   MRN: [id number]   Reason for Appointment: Diabetes follow-up   History of Present Illness   Diagnosis: Type 2 DIABETES MELITUS, date of diagnosis: 2009      Previous history: She has been on various oral hypoglycemic drugs in the past and was on relatively large doses of basal insulin. With starting Victoza in 04/2011 she had drastically cut down her insulin requirement from her previous dosage of 80 units and had lost weight progressively also  Recent history:  Insulin regimen: 30 units Lantus in the evening  Her A1c is slightly higher at 6.6 but her home blood sugars are not reflecting any consistently high readings  Current blood sugar patterns and problems identified:  Her highest blood sugars are still in the morning fasting but not consistently  Has fairly good blood sugars in the evening with some readings after supper with only one high reading of 185  Blood sugars are fairly good after breakfast and around lunchtime, not checking much after lunch  She has no change in her weight from going back to Victoza instead of Saxenda   She left off her  Invokana for about 5 days when she was having UTI with does not think her sugars were any different She has not changed her insulin dose since her last visit Continues to take metformin ER She is  compliant with her evening Lantus after supper  Oral hypoglycemic drugs: Metformin ER 2 g,  Side effects from medications: she may get diarrhea with regular metformin in the morning             Monitors blood glucose:  usually once a day.    Glucometer: One Touch.          Blood Glucose readings from:  Mean values apply above for all meters except median for One Touch  PRE-MEAL Fasting Lunch Dinner Bedtime Overall  Glucose range:  116-203   99-144   82, 139   101-185    Mean/median: 145    129  134    Hypoglycemia frequency:  none        Meals: 3 meals  per day. Some salads at lunch otherwise sandwiches           Physical activity: exercise: Walking more recently Dietician visit:  last 2009          OBESITY: She still has difficulty losing weight and is planning to get bariatric surgery She says she is trying to walk and her diet has not changed  Wt Readings from Last 3 Encounters:  11/06/14 229 lb 9.6 oz (104.146 kg)  11/03/14 226 lb (102.513 kg)  10/11/14 231 lb (104.781 kg)   LABS:  Lab Results  Component Value Date   HGBA1C 6.6* 10/28/2014   HGBA1C 6.1 08/05/2014   HGBA1C 6.5* 04/28/2014   Lab Results  Component Value Date   MICROALBUR 0.2 10/28/2014   LDLCALC 33 10/28/2014   CREATININE 0.74 10/28/2014        Medication List       This list is accurate as of: 11/06/14  8:46 AM.  Always use your most recent med list.               amLODipine 5 MG tablet  Commonly known as:  NORVASC  TAKE 1 TABLET BY MOUTH EVERY DAY     FINACEA 15 % cream  Generic drug:  Azelaic Acid  APP ONE APPLICATION ON THE SKIN D     fluconazole 150 MG tablet  Commonly known as:  DIFLUCAN  Take one today, and a second one while on antibiotics if symptoms return. Take third tab after completing antibiotics.     furosemide 20 MG tablet  Commonly known as:  LASIX  Take 1 tablet as needed     glucose blood test strip  Commonly known as:  BAYER CONTOUR NEXT TEST  Use as instructed to check blood sugar 3 times per day dx code E11.65     HYDROcodone-acetaminophen 10-325 MG tablet  Commonly known as:  NORCO  Take 1 tablet by mouth every 8 (eight) hours as needed.     Insulin Pen Needle 32G X 5 MM Misc  Commonly known as:  NOVOTWIST  Use daily to inject saxenda     INVOKANA 300 MG Tabs tablet  Generic drug:  canagliflozin  TAKE 1 TABLET BY MOUTH EVERY DAY BEFORE BREAKFAST     LANTUS SOLOSTAR 100 UNIT/ML Solostar Pen  Generic drug:  Insulin Glargine  ADMINISTER 50 UNITS UNDER THE SKIN EVERY DAY     meclizine 25 MG tablet    Commonly known as:  ANTIVERT  TAKE 1 TABLET BY MOUTH THREE TIMES DAILY AS NEEDED     metFORMIN 500 MG 24 hr tablet  Commonly known as:  GLUCOPHAGE-XR  TAKE 4 TABLETS BY MOUTH EVERY DAY WITH DINNER     NON FORMULARY  CPAP     promethazine 25 MG tablet  Commonly known as:  PHENERGAN  TAKE 1 TABLET BY MOUTH EVERY 4 HOURS AS NEEDED FOR PAIN     ramipril 10 MG capsule  Commonly known as:  ALTACE  TAKE ONE CAPSULE BY MOUTH EVERY DAY.     rosuvastatin 10 MG tablet  Commonly known as:  CRESTOR  Take 1 tablet (10 mg total) by mouth daily.     sulfamethoxazole-trimethoprim 800-160 MG tablet  Commonly known as:  BACTRIM DS,SEPTRA DS  Take 1 tablet by mouth 2 (two) times daily.     terconazole 0.4 % vaginal cream  Commonly known as:  TERAZOL 7  Place 1 applicator vaginally at bedtime.     triamcinolone cream 0.1 %  Commonly known as:  KENALOG  Apply 1 application topically 3 (three) times daily.     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     Vitamin D (Ergocalciferol) 50000 UNITS Caps capsule  Commonly known as:  DRISDOL  TAKE ONE CAPSULE BY MOUTH EVERY 7 DAYS        Allergies: No Known Allergies  Past Medical History  Diagnosis Date  . Hypertension   . Rosacea   . Psoriasis   . Diabetes mellitus   . OSA (obstructive sleep apnea)     on CPAP  . History of migraine headaches   . Neck pain, chronic   . Hyperlipidemia     Past Surgical History  Procedure Laterality Date  . Cholecystectomy    . Appendectomy    . Tubal ligation    . Abdominal hysterectomy    . Spine surgery      C6-C7 fusion x2  . Spine surgery      herniated disc L4-L5  . Cardiovascular stress test  07/20/2011    Normal myocardial perfusion study,  EKG negative for ischemia, no ECG changes  . 2d echocardiogram  07/20/2011    EF >55%, normal    Family History   Problem Relation Age of Onset  . Diabetes Mother   . Heart disease Mother   . Hypertension Mother   . Lung disease Father   . Heart disease Father   . Hypertension Father   . Diabetes Father   . Hypertension Sister   . Hyperlipidemia Brother   . Hypertension Brother   . Heart attack Maternal Grandmother   . Heart disease Maternal Grandmother   . Hypertension Maternal Grandfather   . Diabetes Maternal Grandfather   . Heart disease Maternal Grandfather     Social History:  reports that she quit smoking about 31 years ago. Her smoking use included Cigarettes. She has a 20 pack-year smoking history. She has never used smokeless tobacco. She reports that she does not drink alcohol. Her drug history is not on file.  Review of Systems:  Hypertension:  currently being treated with ramipril and amlodipine with good control. Home BP usually fairly good and she had fairly good readings with her other providers recently   UTI: She has had treatment for this twice last month and also another episode in May.  Was given Bactrim and her urine culture showed staph   Lipids: She is on Crestor for cardiovascular protection but takes half tablet daily   Lab Results  Component Value Date   CHOL 97* 10/28/2014   HDL 40* 10/28/2014   LDLCALC 33 10/28/2014   TRIG 120 10/28/2014   CHOLHDL 2.4 10/28/2014    Taking Vitamin D weekly as a supplement from PCP    Stress incontinence present    Examination:   BP 134/88 mmHg  Pulse 81  Temp(Src) 98.3 F (36.8 C)  Resp 16  Ht 5\' 5"  (1.651 m)  Wt 229 lb 9.6 oz (104.146 kg)  BMI 38.21 kg/m2  SpO2 97%  Body mass index is 38.21 kg/(m^2).    ASSESSMENT/ PLAN:   Diabetes type 2   Blood glucose control is fairly good with upper normal A1c again Again her fasting readings are the highest of the day and she has done better with monitoring at various times recently She is trying to exercise a little bit but probably not enough to lose weight   She has experienced more frequent UTI, possibly from Invokana; however yeast infection is secondary to antibiotics Also she has no benefit previously from  [place] with weight loss   Recommendations today:  She will try to exercise for longer times and more often   Since she is getting more UTI will have her temporarily stop Invokana  Discussed that she may need a diuretic if she starts retaining fluid and also need to watch her blood pressure without Invokana  She will also discussed her stress incontinence with gynecologist and discuss this is causing UTI  Consider 100 mg Invokana or Jardiance if blood sugars go off significantly   Hypertension: Continue follow-up with PCP and monitor at home, blood pressure higher today but this is unusual  LIPIDS: Very well controlled with Crestor   Letter dictated for approval for her bariatric surgery   Patient Instructions  Leave off Invokana      Counseling time on subjects discussed above is over 50% of today's 25 minute visit   Melissa Benson 11/06/2014, 8:46 AM

## 2014-11-10 ENCOUNTER — Encounter: Payer: Self-pay | Admitting: Internal Medicine

## 2014-11-10 ENCOUNTER — Ambulatory Visit (INDEPENDENT_AMBULATORY_CARE_PROVIDER_SITE_OTHER): Payer: BLUE CROSS/BLUE SHIELD | Admitting: Internal Medicine

## 2014-11-10 VITALS — BP 128/76 | HR 88 | Temp 98.8°F | Resp 20 | Ht 65.0 in | Wt 227.0 lb

## 2014-11-10 DIAGNOSIS — E119 Type 2 diabetes mellitus without complications: Secondary | ICD-10-CM

## 2014-11-10 DIAGNOSIS — E669 Obesity, unspecified: Secondary | ICD-10-CM | POA: Diagnosis not present

## 2014-11-10 DIAGNOSIS — Z23 Encounter for immunization: Secondary | ICD-10-CM

## 2014-11-10 DIAGNOSIS — E785 Hyperlipidemia, unspecified: Secondary | ICD-10-CM | POA: Diagnosis not present

## 2014-11-10 DIAGNOSIS — N390 Urinary tract infection, site not specified: Secondary | ICD-10-CM

## 2014-11-10 DIAGNOSIS — I10 Essential (primary) hypertension: Secondary | ICD-10-CM

## 2014-11-10 DIAGNOSIS — Z0000 Encounter for general adult medical examination without abnormal findings: Secondary | ICD-10-CM | POA: Diagnosis not present

## 2014-11-10 DIAGNOSIS — M542 Cervicalgia: Secondary | ICD-10-CM | POA: Diagnosis not present

## 2014-11-10 LAB — POCT URINALYSIS DIPSTICK
BILIRUBIN UA: NEGATIVE
GLUCOSE UA: 1000
Ketones, UA: NEGATIVE
Leukocytes, UA: NEGATIVE
NITRITE UA: NEGATIVE
Protein, UA: NEGATIVE
Spec Grav, UA: 1.02
Urobilinogen, UA: 0.2
pH, UA: 6

## 2014-11-10 MED ORDER — HYDROCODONE-ACETAMINOPHEN 10-325 MG PO TABS: 1.0000 | ORAL_TABLET | Freq: Three times a day (TID) | ORAL | Status: DC | PRN

## 2014-11-10 NOTE — Patient Instructions (Signed)
Pt being evaluated for weight loss surgery. Needs written recommendation and documentation from me. RTC in 6 months.

## 2014-11-10 NOTE — Progress Notes (Signed)
Subjective:    Patient ID: Melissa Benson, female    DOB: 1960-11-01, 54 y.o.   MRN: [id number]  HPI  Seen at Urgent Care Oct 31st with UTI grew Coagulase negative Staph species treated with Sulfa. Is considering weight loss surgery. Has talked with Dr. Wenda Low. Needs letter from me documenting weight loss efforts. History of DM treated by Dr. Lucianne Muss. AIC has increased to 6.6% from last measurement  6.1%. Off Invokana, Off Victoza.  She's been a patient here since 2006. At that time she weighed 251 pounds. In 2007, she weighed 252 pounds, 257 pounds in 2008 and 260 pounds in 2009.  In 2009 her hemoglobin A1c was 9.1%. She was tried [place]  and oral agents. In 2010 she was started on insulin. She was subsequently referred to Dr. Lucianne Muss, endocrinologist as her hemoglobin A1c had increased to 9.1%. Her weights and BMIs for the past 5 years are as follows:  2012   248 pounds    BMI of 42 2013    258 pounds    BMI of 44 2014   223  Pounds   BMI of 37 2015   225 pounds     BMI of 38 2016   227 pounds      BMI of 39  Medical issues including diabetes or hypertension, hyperlipidemia, chronic neck pain, history of migraine headaches, sleep apnea, obesity and depression.  Past medical history: Appendectomy 1985. Cholecystectomy 1986. Bilateral tubal ligation 1984. Hysterectomy with one ovary removed 2003. Exploratory surgery for GYN issues 3 times. Surgery for herniated cervical disc in 2004 in 2005. 2 pregnancies and no miscarriages. Takes hydrocodone/APAP for chronic neck pain.  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  Colonoscopy done by Dr. Ewing Schlein September 2014 showing no adenomatous polyps  Family history: Father with history of diabetes mellitus. Mother with history of heart disease. 3 brothers all of whom have substance abuse problems. 2 sisters one of whom is a diabetic.    Review of Systems  Constitutional: Negative.   HENT: Negative.   Eyes:         Coryza right eye this weekend. Last eye exam Dr. Abel Presto at [facility]  Respiratory: Negative.   Cardiovascular: Negative.   Gastrointestinal: Positive for nausea and diarrhea.  Genitourinary: Negative.        Stress urinary incontinence  Musculoskeletal: Positive for back pain.  Neurological: Negative.   Hematological: Negative.        Objective:   Physical Exam  Constitutional: She is oriented to person, place, and time. She appears well-developed and well-nourished. No distress.  HENT:  Head: Normocephalic and atraumatic.  Right Ear: External ear normal.  Left Ear: External ear normal.  Mouth/Throat: Oropharynx is clear and moist. No oropharyngeal exudate.  Eyes: Conjunctivae and EOM are normal. Pupils are equal, round, and reactive to light. Right eye exhibits no discharge. Left eye exhibits no discharge.  Neck: Neck supple. No JVD present. No thyromegaly present.  Cardiovascular: Normal rate, regular rhythm and normal heart sounds.   No murmur heard. Pulmonary/Chest: Effort normal and breath sounds normal. She has no wheezes.  Breast normal female without masses  Abdominal: Soft. Bowel sounds are normal. She exhibits no distension and no mass. There is no tenderness. There is no rebound and no guarding.  Genitourinary:  Deferred to Dr. Dareen Piano, GYN  Musculoskeletal: Normal range of motion. She exhibits no edema.  Lymphadenopathy:    She  has no cervical adenopathy.  Neurological: She is alert and oriented to person, place, and time. She has normal reflexes. No cranial nerve deficit. Coordination normal.  Skin: Skin is warm and dry. No rash noted. She is not diaphoretic.  Psychiatric: She has a normal mood and affect. Her behavior is normal. Judgment and thought content normal.  Vitals reviewed.         Assessment & Plan:  Obesity-considering weight loss surgery. Needs documentation and let her from May for insurance approval.  Obstructive sleep  apnea  Hyperlipidemia  Controlled type 2 diabetes mellitus  Hypertension  Chronic neck pain  Metabolic syndrome  Plan: Return in 6 months or as needed. [facility]

## 2014-11-13 ENCOUNTER — Other Ambulatory Visit: Payer: Self-pay

## 2014-11-13 ENCOUNTER — Ambulatory Visit (HOSPITAL_COMMUNITY)
Admission: RE | Admit: 2014-11-13 | Discharge: 2014-11-13 | Disposition: A | Discharge status: Home / Self Care | Payer: BLUE CROSS/BLUE SHIELD | Source: Ambulatory Visit | Attending: Surgery | Admitting: Surgery

## 2014-11-13 ENCOUNTER — Encounter (HOSPITAL_COMMUNITY)
Admission: RE | Admit: 2014-11-13 | Discharge: 2014-11-13 | Disposition: A | Discharge status: Home / Self Care | Payer: BLUE CROSS/BLUE SHIELD | Source: Ambulatory Visit | Attending: Surgery | Admitting: Surgery

## 2014-11-13 ENCOUNTER — Other Ambulatory Visit (HOSPITAL_COMMUNITY): Payer: BLUE CROSS/BLUE SHIELD

## 2014-11-13 DIAGNOSIS — Z01818 Encounter for other preprocedural examination: Secondary | ICD-10-CM | POA: Insufficient documentation

## 2014-11-14 ENCOUNTER — Ambulatory Visit (HOSPITAL_COMMUNITY)
Admission: RE | Admit: 2014-11-14 | Discharge: 2014-11-14 | Disposition: A | Discharge status: Home / Self Care | Payer: BLUE CROSS/BLUE SHIELD | Source: Ambulatory Visit | Attending: Surgery | Admitting: Surgery

## 2014-11-14 DIAGNOSIS — Z01818 Encounter for other preprocedural examination: Secondary | ICD-10-CM | POA: Diagnosis not present

## 2014-11-14 DIAGNOSIS — K449 Diaphragmatic hernia without obstruction or gangrene: Secondary | ICD-10-CM | POA: Insufficient documentation

## 2014-11-20 ENCOUNTER — Other Ambulatory Visit: Payer: Self-pay | Admitting: Obstetrics and Gynecology

## 2014-11-20 DIAGNOSIS — R928 Other abnormal and inconclusive findings on diagnostic imaging of breast: Secondary | ICD-10-CM

## 2014-11-24 ENCOUNTER — Ambulatory Visit
Admission: RE | Admit: 2014-11-24 | Discharge: 2014-11-24 | Disposition: A | Discharge status: Home / Self Care | Payer: BLUE CROSS/BLUE SHIELD | Source: Ambulatory Visit | Attending: Obstetrics and Gynecology | Admitting: Obstetrics and Gynecology

## 2014-11-24 ENCOUNTER — Telehealth: Payer: Self-pay | Admitting: Pulmonary Disease

## 2014-11-24 DIAGNOSIS — R928 Other abnormal and inconclusive findings on diagnostic imaging of breast: Secondary | ICD-10-CM

## 2014-11-24 NOTE — Telephone Encounter (Signed)
Compliance Report - 10/11 - 11/9  AHI: 0.6 Good usage on CM No residuals  Continue usage per RA

## 2014-11-25 ENCOUNTER — Ambulatory Visit: Payer: BLUE CROSS/BLUE SHIELD | Admitting: Adult Health

## 2014-11-25 NOTE — Telephone Encounter (Signed)
LMOMTCB x 1 

## 2014-11-26 NOTE — Telephone Encounter (Signed)
lmtcb x2 

## 2014-11-27 ENCOUNTER — Other Ambulatory Visit: Payer: Self-pay | Admitting: Internal Medicine

## 2014-11-28 ENCOUNTER — Other Ambulatory Visit: Payer: Self-pay | Admitting: Endocrinology

## 2014-11-28 NOTE — Telephone Encounter (Signed)
Patient notified.  No questions or concerns at this time. Nothing further needed.   

## 2014-12-01 ENCOUNTER — Other Ambulatory Visit: Payer: BLUE CROSS/BLUE SHIELD

## 2014-12-01 ENCOUNTER — Other Ambulatory Visit: Payer: Self-pay | Admitting: *Deleted

## 2014-12-01 MED ORDER — VICTOZA 18 MG/3ML ~~LOC~~ SOPN: 1.8000 mg | PEN_INJECTOR | Freq: Every day | SUBCUTANEOUS | Status: DC

## 2014-12-02 ENCOUNTER — Other Ambulatory Visit: Payer: Self-pay | Admitting: Obstetrics and Gynecology

## 2014-12-02 DIAGNOSIS — N6489 Other specified disorders of breast: Secondary | ICD-10-CM

## 2014-12-03 ENCOUNTER — Ambulatory Visit: Payer: BLUE CROSS/BLUE SHIELD | Admitting: Dietician

## 2014-12-08 ENCOUNTER — Ambulatory Visit
Admission: RE | Admit: 2014-12-08 | Discharge: 2014-12-08 | Disposition: A | Discharge status: Home / Self Care | Payer: BLUE CROSS/BLUE SHIELD | Source: Ambulatory Visit | Attending: Obstetrics and Gynecology | Admitting: Obstetrics and Gynecology

## 2014-12-08 DIAGNOSIS — N6489 Other specified disorders of breast: Secondary | ICD-10-CM

## 2014-12-08 MED ORDER — GADOBENATE DIMEGLUMINE 529 MG/ML IV SOLN
20.0000 mL | Freq: Once | INTRAVENOUS | Status: AC | PRN
  Administered 2014-12-08: 20 mL via INTRAVENOUS

## 2014-12-11 ENCOUNTER — Encounter: Payer: BLUE CROSS/BLUE SHIELD | Attending: Internal Medicine | Admitting: Dietician

## 2014-12-11 ENCOUNTER — Encounter: Payer: Self-pay | Admitting: Dietician

## 2014-12-11 VITALS — Ht 66.0 in | Wt 233.7 lb

## 2014-12-11 DIAGNOSIS — Z01818 Encounter for other preprocedural examination: Secondary | ICD-10-CM | POA: Diagnosis not present

## 2014-12-11 DIAGNOSIS — E669 Obesity, unspecified: Secondary | ICD-10-CM

## 2014-12-11 NOTE — Progress Notes (Signed)
  Pre-Op Assessment Visit:  Pre-Operative RYGB Surgery  Medical Nutrition Therapy:  Appt start time: 1625  End time:  1655.  Patient was seen on 12/11/2014 for Pre-Operative Nutrition Assessment. Assessment and letter of approval [facility] coordinator on 12/11/2014.   Preferred Learning Style:   No preference indicated   Learning Readiness:   Ready  Handouts given during visit include:  Pre-Op Goals Bariatric Surgery Protein Shakes   During the appointment today the following Pre-Op Goals were reviewed with the patient: Maintain or lose weight as instructed by your surgeon Make healthy food choices Begin to limit portion sizes Limited concentrated sugars and fried foods Keep fat/sugar in the single digits per serving on   food labels Practice CHEWING your food  (aim for 30 chews per bite or until applesauce consistency) Practice not drinking 15 minutes before, during, and 30 minutes after each meal/snack Avoid all carbonated beverages  Avoid/limit caffeinated beverages  Avoid all sugar-sweetened beverages Consume 3 meals per day; eat every 3-5 hours Make a list of non-food related activities Aim for 64-100 ounces of FLUID daily  Aim for at least 60-80 grams of PROTEIN daily Look for a liquid protein source that contain ?15 g protein and ?5 g carbohydrate  (ex: shakes, drinks, shots)  Patient-Centered Goals: Goals: come off of medication, feeling better, more active 10 level of confidence/10 level of importance scale   Demonstrated degree of understanding via:  Teach Back  Teaching Method Utilized:  Visual Auditory Hands on  Barriers to learning/adherence to lifestyle change: none  Patient to call the [facility] to enroll in Pre-Op and Post-Op Nutrition Education when surgery date is scheduled.

## 2014-12-11 NOTE — Patient Instructions (Signed)

## 2014-12-16 ENCOUNTER — Encounter: Payer: Self-pay | Admitting: Adult Health

## 2014-12-16 ENCOUNTER — Encounter: Payer: Self-pay | Admitting: Pulmonary Disease

## 2014-12-16 ENCOUNTER — Ambulatory Visit (INDEPENDENT_AMBULATORY_CARE_PROVIDER_SITE_OTHER): Payer: BLUE CROSS/BLUE SHIELD | Admitting: Adult Health

## 2014-12-16 VITALS — BP 124/78 | HR 82 | Temp 98.0°F | Ht 66.0 in | Wt 237.0 lb

## 2014-12-16 DIAGNOSIS — G4733 Obstructive sleep apnea (adult) (pediatric): Secondary | ICD-10-CM | POA: Diagnosis not present

## 2014-12-16 DIAGNOSIS — E669 Obesity, unspecified: Secondary | ICD-10-CM | POA: Diagnosis not present

## 2014-12-16 DIAGNOSIS — Z9989 Dependence on other enabling machines and devices: Principal | ICD-10-CM

## 2014-12-16 NOTE — Patient Instructions (Signed)
Keep up the good work. Wears C Pap each night. Do not drive if sleepy Work on weight loss. Follow with Dr. Vassie LollAlva in 1 year and as needed

## 2014-12-16 NOTE — Progress Notes (Signed)
Subjective:    Patient ID: Melissa Benson, female    DOB: January 24, 1960, 54 y.o.   MRN: [id number]  HPI 54 year old female with sleep apnea  TEST  She was diagnosed over 10 years ago. PSG 12/2002-weight 240 pounds-showed RDI of 9/hour This was corrected by CPAP 8 cm on a titration study in 01/2003.  12/16/2014 follow-up sleep apnea Patient returns for a three-month follow-up. Patient has been on C Pap for many years. Recently had to get a new machine. She is doing very well. Download from November 12 to December 11 shows excellent compliance with average usage at 8.5 hours. She is on a set pressure of 8 cm of H2O. AHI 0.4. Patient denies any chest pain, orthopnea, PND or swelling.    Past Medical History  Diagnosis Date  . Hypertension   . Rosacea   . Psoriasis   . Diabetes mellitus   . OSA (obstructive sleep apnea)     on CPAP  . History of migraine headaches   . Neck pain, chronic   . Hyperlipidemia    Current Outpatient Prescriptions on File Prior to Visit  Medication Sig Dispense Refill  . amLODipine (NORVASC) 5 MG tablet TAKE 1 TABLET BY MOUTH EVERY DAY 30 tablet 3  . FINACEA 15 % cream APP ONE APPLICATION ON THE SKIN D  4  . glucose blood (BAYER CONTOUR NEXT TEST) test strip Use as instructed to check blood sugar 3 times per day dx code E11.65 100 each 5  . HYDROcodone-acetaminophen (NORCO) 10-325 MG tablet Take 1 tablet by mouth every 8 (eight) hours as needed. 90 tablet 0  . Insulin Pen Needle (NOVOTWIST) 32G X 5 MM MISC Use daily to inject saxenda 50 each 3  . LANTUS SOLOSTAR 100 UNIT/ML Solostar Pen ADMINISTER 50 UNITS UNDER THE SKIN EVERY DAY 15 mL 3  . meclizine (ANTIVERT) 25 MG tablet TAKE 1 TABLET BY MOUTH THREE TIMES DAILY AS NEEDED 30 tablet 5  . metFORMIN (GLUCOPHAGE-XR) 500 MG 24 hr tablet TAKE 4 TABLETS BY MOUTH EVERY DAY WITH DINNER 120 tablet 3  . NON FORMULARY CPAP    . promethazine (PHENERGAN) 25 MG tablet TAKE 1 TABLET BY MOUTH EVERY 4 HOURS AS  NEEDED FOR PAIN (Patient taking differently: TAKE 1 TABLET BY MOUTH EVERY 4 HOURS AS NEEDED FOR NAUSEA) 30 tablet 0  . ramipril (ALTACE) 10 MG capsule TAKE ONE CAPSULE BY MOUTH EVERY DAY. 90 capsule 0  . rosuvastatin (CRESTOR) 10 MG tablet Take 1 tablet (10 mg total) by mouth daily. 90 tablet 3  . terconazole (TERAZOL 7) 0.4 % vaginal cream Place 1 applicator vaginally at bedtime. 45 g 3  . VICTOZA 18 MG/3ML SOPN Inject 0.3 mLs (1.8 mg total) into the skin daily. 9 mL 5  . Vitamin D, Ergocalciferol, (DRISDOL) 50000 UNITS CAPS capsule TAKE ONE CAPSULE BY MOUTH EVERY 7 DAYS 12 capsule 3  . INVOKANA 300 MG TABS tablet TAKE 1 TABLET BY MOUTH EVERY DAY BEFORE BREAKFAST (Patient not taking: Reported on 12/16/2014) 30 tablet 5  . valACYclovir (VALTREX) 500 MG tablet One po bid x 5 days for Herpes Simplex Type I (Patient not taking: Reported on 11/10/2014) 10 tablet 3   No current facility-administered medications on file prior to visit.    Review of Systems  Constitutional:   No  weight loss, night sweats,  Fevers, chills,  +fatigue, or  lassitude.  HEENT:   No headaches,  Difficulty swallowing,  Tooth/dental problems, or  Sore throat,  No sneezing, itching, ear ache, nasal congestion, post nasal drip,   CV:  No chest pain,  Orthopnea, PND, swelling in lower extremities, anasarca, dizziness, palpitations, syncope.   GI  No heartburn, indigestion, abdominal pain, nausea, vomiting, diarrhea, change in bowel habits, loss of appetite, bloody stools.   Resp:    No chest wall deformity  Skin: no rash or lesions.  GU: no dysuria, change in color of urine, no urgency or frequency.  No flank pain, no hematuria   MS:  No joint pain or swelling.  No decreased range of motion.  No back pain.  Psych:  No change in mood or affect. No depression or anxiety.  No memory loss.          Objective:   Physical Exam GEN: A/Ox3; pleasant , NAD, obese   HEENT:  [place]/AT,  EACs-clear, TMs-wnl,  NOSE-clear, THROAT-clear, no lesions, no postnasal drip or exudate noted. Class 2-3 airway   NECK:  Supple w/ fair ROM; no JVD; normal carotid impulses w/o bruits; no thyromegaly or nodules palpated; no lymphadenopathy.  RESP  Clear  P & A; w/o, wheezes/ rales/ or rhonchi.no accessory muscle use, no dullness to percussion  CARD:  RRR, no m/r/g  , no peripheral edema, pulses intact, no cyanosis or clubbing.  GI:   Soft & nt; nml bowel sounds; no organomegaly or masses detected.  Musco: Warm bil, no deformities or joint swelling noted.   Neuro: alert, no focal deficits noted.    Skin: Warm, no lesions or rashes         Assessment & Plan:

## 2014-12-16 NOTE — Assessment & Plan Note (Signed)
OSA well controlled on CPAP   Plan  Keep up the good work. Wears C Pap each night. Do not drive if sleepy Work on weight loss. Follow with Dr. Vassie LollAlva in 1 year and as needed

## 2014-12-16 NOTE — Assessment & Plan Note (Signed)
Wt loss  

## 2014-12-30 ENCOUNTER — Other Ambulatory Visit: Payer: Self-pay | Admitting: Endocrinology

## 2014-12-30 VITALS — Ht 66.0 in | Wt 233.0 lb

## 2014-12-30 DIAGNOSIS — Z01818 Encounter for other preprocedural examination: Secondary | ICD-10-CM | POA: Diagnosis not present

## 2014-12-30 DIAGNOSIS — E669 Obesity, unspecified: Secondary | ICD-10-CM

## 2014-12-31 ENCOUNTER — Ambulatory Visit: Payer: Self-pay | Admitting: Surgery

## 2014-12-31 ENCOUNTER — Telehealth: Payer: Self-pay | Admitting: Endocrinology

## 2014-12-31 NOTE — Progress Notes (Signed)
  Pre-Operative Nutrition Class:  Appt start time: 400   End time:  500.  Patient was seen on 12/30/2014 for Pre-Operative Bariatric Surgery Education at the [facility].   Surgery date: 01/12/2015 Surgery type: RYGB [facility] weight at [facility]: 234 lbs on 12/11/2014 Weight today: 233 lbs  TANITA  BODY COMP RESULTS  12/30/14   BMI (kg/m^2) 37.6   Fat Mass (lbs) 109   Fat Free Mass (lbs) 124   Total Body Water (lbs) 91   Samples given per MNT protocol. Patient educated on appropriate usage: Celebrate calcium citrate chew (caramel - qty 1) Lot #: [id number] Exp: 03/2016  Unjury protein powder (chicken soup - qty 1) Lot #: [id number] Exp: 11/2015  PB2 (qty 1) Lot #: [id number] Exp: 10/2016  Premier protein shake (vanilla - qty 1) Lot #: [id number] Exp: 06/2015  The following the learning objectives were met by the patient during this course:  Identify Pre-Op Dietary Goals and will begin 2 weeks pre-operatively  Identify appropriate sources of fluids and proteins   State protein recommendations and appropriate sources pre and post-operatively  Identify Post-Operative Dietary Goals and will follow for 2 weeks post-operatively  Identify appropriate multivitamin and calcium sources  Describe the need for physical activity post-operatively and will follow MD recommendations  State when to call healthcare provider regarding medication questions or post-operative complications  Handouts given during class include:  Pre-Op Bariatric Surgery Diet Handout  Protein Shake Handout  Post-Op Bariatric Surgery Nutrition Handout  [facility] Program Information Flyer  Support Group Information Flyer  [facility] Bariatric Supplements Price List  Follow-Up Plan: Patient will follow-up at [facility] 2 weeks post operatively for diet advancement per MD.

## 2014-12-31 NOTE — H&P (Signed)
Melissa GullyKathy M. [name] 12/31/2014 12:22 PM Location: [facility] Patient #: [id number] DOB: 06/13/1960 Married / Language: [place] / Race: White Female  History of Present Illness Molli Hazard(Johnhenry Tippin B. Daphine DeutscherMartin MD; 12/31/2014 12:45 PM) The patient is a 54 year old female who presents for a bariatric surgery evaluation. This is a preop visit for Melissa MerrittsCathy Benson who is scheduled for a Roux-en-Y gastric bypass on January 9. I have secondarily appropriate with her possibility of a sleeve gastrectomy. This is because she's had a prior open cholecystectomy, appendectomy, and an exploratory laparotomy with hysterectomy. Her permanent will therefore say Roux-en-Y gastric bypass possible sleeve gastrectomy.  On her upper GI showed a small sliding-type hiatal hernia. She really didn't have much in way of reflux. Today's BMI is 37 with a weight of 231. She is diabetic. Her primary care physician is Dr. Eden EmmsMary John Baxley.   Allergies Melissa Benson(Melissa Benson, CMA; 12/31/2014 12:22 PM) No Known Drug Allergies 10/16/2014  Medication History Melissa Benson(Melissa Benson, [place]; 12/31/2014 12:23 PM) OxyCODONE HCl (5MG /5ML Solution, 5-10 Milliliter Oral every four hours, as needed, Taken starting 12/31/2014) Active. Protonix (40MG  Tablet DR, 1 (one) Tablet Oral daily, Taken starting 12/31/2014) Active. [employer] Contour Next Test (In Vitro) Active. Crestor (10MG  Tablet, Oral daily) Active. AmLODIPine Besylate (5MG  Tablet, Oral daily) Active. MetFORMIN HCl ER (500MG  Tablet ER 24HR, Oral daily) Active. Promethazine HCl (25MG  Tablet, Oral prn nausea) Active. Ramipril (10MG  Capsule, Oral daily) Active. Lantus SoloStar (100UNIT/ML Soln Pen-inj, Subcutaneous) Active. Saxenda (18MG /3ML Soln Pen-inj, Subcutaneous) Active. Victoza (18MG /3ML Soln Pen-inj, Subcutaneous) Active. Meclizine HCl (25MG  Tablet, Oral) Active. Invokana (300MG  Tablet, Oral) Active. Fiber (Oral) Active. Colace (100MG  Capsule, Oral) Active. Medications  Reconciled    Vitals Melissa Benson(Melissa Benson CMA; 12/31/2014 12:23 PM) 12/31/2014 12:23 PM Weight: 231 lb Height: 66in Body Surface Area: 2.13 m Body Mass Index: 37.28 kg/m  Temp.: 78F(Temporal)  Pulse: 105 (Regular)  BP: 142/86 (Sitting, Left Arm, Standard)      Physical Exam (Curren Mohrmann B. Daphine DeutscherMartin MD; 12/31/2014 12:46 PM)  The physical exam findings are as follows: Note:Moderately obese white female in no acute distress Head normocephalic, sclera nonicteric, pupils equal round and reactive to light Neck prior anterior and posterior plates for partial cervical fusion. Chest clear to auscultation Heart sinus rhythm without murmurs or gallops Abdomen open transverse incision the right upper quadrant gallbladder. Lower midline incision for hysterectomy. Extremities full range of motion Neuro alert and oriented 3. Motor and sensory function grossly intact.    Assessment & Plan Molli Hazard(Anica Alcaraz B. Daphine DeutscherMartin MD; 12/31/2014 12:47 PM)  SEVERE OBESITY (BMI 35.0-35.9 WITH COMORBIDITY) (Z68.35)  Plan roux en Y gastric bypass possible sleeve gastrectomy.

## 2014-12-31 NOTE — Telephone Encounter (Signed)
Cassie from [employer] stated that she need to talk to someone who covers the Prior Auth concerning medication Faxenda, and she also have some questions, Fax # [phone] Request # [id number]

## 2014-12-31 NOTE — Telephone Encounter (Signed)
Pt needs to know what to do about insulin and bp med dosing whe she goes in to do the bariatric surgery on 1-9

## 2014-12-31 NOTE — Telephone Encounter (Signed)
Please see below and advise.

## 2015-01-01 NOTE — Telephone Encounter (Signed)
She does not need to take any

## 2015-01-02 NOTE — Telephone Encounter (Signed)
Noted, detailed message left on patients home phone.

## 2015-01-05 MED FILL — oxyCODONE HCL 5 MG/5ML SOLN: 5 | 4 days supply | Qty: 200 | Fill #0

## 2015-01-06 ENCOUNTER — Telehealth: Payer: Self-pay | Admitting: Internal Medicine

## 2015-01-06 NOTE — Telephone Encounter (Signed)
Patient is scheduled for bariatric surgery on Monday, 01/12/15.  The surgeon has asked her to call and find out about her BP and insulin medications.  Does she need to stop them prior to surgery?  Olegario MessierKathy wants to know if you want to see her prior to her surgery on Monday?  States that the surgeon will not be following these after surgery, so she needs to know what to do PRIOR to surgery regarding these 2 issues.    Please advise.

## 2015-01-06 NOTE — Progress Notes (Signed)
Reviewed & agree with plan  

## 2015-01-06 NOTE — Telephone Encounter (Signed)
Left message on home and cell VM, asking pt to return my call.

## 2015-01-06 NOTE — Telephone Encounter (Signed)
Depends on time of surgery. Has she seen anesthesia yet? They usually tell her when and what to take

## 2015-01-07 NOTE — Progress Notes (Signed)
Abbreviations noted in surgical consent. Please clarify. Thanks. 

## 2015-01-07 NOTE — Telephone Encounter (Signed)
[phone] work [phone] cell

## 2015-01-07 NOTE — Telephone Encounter (Signed)
Patient states that she will not be able to swallow large pills and will have to crush pills to take them. She cannot do that to the metformin ER and so she wants to change to the standard metformin. Also she wants to know if she can open the capsule of her ramipril and take it like that. She states that she meets with anethesia tomorrow morning and they will tell her how to do the medications before surgery. Her original call was for after surgery and changing of the medications and your approval, before they would do the surgery.

## 2015-01-08 ENCOUNTER — Encounter (HOSPITAL_COMMUNITY): Payer: Self-pay

## 2015-01-08 ENCOUNTER — Encounter (HOSPITAL_COMMUNITY)
Admission: RE | Admit: 2015-01-08 | Discharge: 2015-01-08 | Disposition: A | Discharge status: Home / Self Care | Payer: BLUE CROSS/BLUE SHIELD | Source: Ambulatory Visit | Attending: Surgery | Admitting: Surgery

## 2015-01-08 ENCOUNTER — Telehealth: Payer: Self-pay | Admitting: Endocrinology

## 2015-01-08 DIAGNOSIS — Z01818 Encounter for other preprocedural examination: Secondary | ICD-10-CM | POA: Insufficient documentation

## 2015-01-08 HISTORY — DX: Other specified postprocedural states: R11.2

## 2015-01-08 HISTORY — DX: Personal history of urinary (tract) infections: Z87.440

## 2015-01-08 HISTORY — DX: Other complications of anesthesia, initial encounter: T88.59XA

## 2015-01-08 HISTORY — DX: Poor urinary stream: R39.12

## 2015-01-08 HISTORY — DX: Headache: R51

## 2015-01-08 HISTORY — DX: Adverse effect of unspecified anesthetic, initial encounter: T41.45XA

## 2015-01-08 HISTORY — DX: Other specified postprocedural states: Z98.890

## 2015-01-08 HISTORY — DX: Personal history of other specified conditions: Z87.898

## 2015-01-08 HISTORY — DX: Headache, unspecified: R51.9

## 2015-01-08 HISTORY — DX: Personal history of other diseases of the digestive system: Z87.19

## 2015-01-08 HISTORY — DX: Gastro-esophageal reflux disease without esophagitis: K21.9

## 2015-01-08 LAB — CBC WITH DIFFERENTIAL/PLATELET
BASOS ABS: 0 10*3/uL (ref 0.0–0.1)
Basophils Relative: 0 %
EOS PCT: 1 %
Eosinophils Absolute: 0.1 10*3/uL (ref 0.0–0.7)
HCT: 38.5 % (ref 36.0–46.0)
Hemoglobin: 12.6 g/dL (ref 12.0–15.0)
LYMPHS ABS: 1.7 10*3/uL (ref 0.7–4.0)
LYMPHS PCT: 29 %
MCH: 29.3 pg (ref 26.0–34.0)
MCHC: 32.7 g/dL (ref 30.0–36.0)
MCV: 89.5 fL (ref 78.0–100.0)
MONO ABS: 0.4 10*3/uL (ref 0.1–1.0)
Monocytes Relative: 6 %
Neutro Abs: 3.7 10*3/uL (ref 1.7–7.7)
Neutrophils Relative %: 64 %
PLATELETS: 266 10*3/uL (ref 150–400)
RBC: 4.3 MIL/uL (ref 3.87–5.11)
RDW: 13.5 % (ref 11.5–15.5)
WBC: 5.9 10*3/uL (ref 4.0–10.5)

## 2015-01-08 LAB — COMPREHENSIVE METABOLIC PANEL
ALT: 62 U/L — ABNORMAL HIGH (ref 14–54)
AST: 54 U/L — ABNORMAL HIGH (ref 15–41)
Albumin: 4.8 g/dL (ref 3.5–5.0)
Alkaline Phosphatase: 43 U/L (ref 38–126)
Anion gap: 10 (ref 5–15)
BUN: 19 mg/dL (ref 6–20)
CALCIUM: 10.2 mg/dL (ref 8.9–10.3)
CO2: 27 mmol/L (ref 22–32)
CREATININE: 0.72 mg/dL (ref 0.44–1.00)
Chloride: 104 mmol/L (ref 101–111)
Glucose, Bld: 112 mg/dL — ABNORMAL HIGH (ref 65–99)
Potassium: 4.2 mmol/L (ref 3.5–5.1)
Sodium: 141 mmol/L (ref 135–145)
Total Bilirubin: 0.4 mg/dL (ref 0.3–1.2)
Total Protein: 7.8 g/dL (ref 6.5–8.1)

## 2015-01-08 NOTE — Telephone Encounter (Signed)
Dr. Lucianne MussKumar said there was no need for this.

## 2015-01-08 NOTE — Telephone Encounter (Signed)
She will need to go to the pharmacy and discuss her medications right away

## 2015-01-08 NOTE — Progress Notes (Signed)
EKG / epic 11/13/2014 CXR/epic 11/13/2014 ECHO epic 07/20/2011 LOV note / cardiology Dr Allyson SabalBerry / epic 09/26/2012 discusses ECHO and a myoview pt had preformed Pt has not seen cardiology since Dr Ceasar MonsMoser/anesthesia reviewed cardiology note and EKGs per 11/13/2014 and 09/28/2012 in epic also. No orders given/Anesthesia to see pt day of surgery.

## 2015-01-08 NOTE — Telephone Encounter (Signed)
[employer] calling regarding the denial for saxenda there is an ability to appeal and [facility] # [phone]

## 2015-01-08 NOTE — Patient Instructions (Addendum)
Cleda MccreedyKathy M Demarest  01/08/2015   Your procedure is scheduled on: Monday January 12, 2015   [facility]  Entrance [place]  elevators to 3rd floor to  Short Stay Center at 5:15 AM.  Call this number if you have problems the morning of surgery [phone]   Remember: ONLY 1 PERSON MAY GO WITH YOU TO SHORT STAY TO GET  READY MORNING OF YOUR SURGERY.  Do not eat food or drink liquids :After Midnight.     Take these medicines the morning of surgery with A SIP OF WATER: Amlodipine (Norvasc);    TAKE 1/2 DOSE OF INSULIN NIGHT PRIOR TO SURGERY  DO NOT TAKE ANY DIABETIC MEDICATIONS DAY OF YOUR SURGERY                               You may not have any metal on your body including hair pins and              piercings  Do not wear jewelry, make-up, lotions, powders or perfumes, deodorant             Do not wear nail polish.  Do not shave  48 hours prior to surgery.               Do not bring valuables to the hospital. [facility] IS NOT             RESPONSIBLE   FOR VALUABLES.  Contacts, dentures or bridgework may not be worn into surgery.  Leave suitcase in the car. After surgery it may be brought to your room.    Special Instructions: FOLLOW SURGEON'S INSTRUCTION IN REGARDS TO BOWEL PREPARATION PRIOR TO SURGICAL PROCEDURE  _____________________________________________________________________             [facility] Before surgery, you can play an important role.  Because skin is not sterile, your skin needs to be as free of germs as possible.  You can reduce the number of germs on your skin by washing with CHG (chlorahexidine gluconate) soap before surgery.  CHG is an antiseptic cleaner which kills germs and bonds with the skin to continue killing germs even after washing. Please DO NOT use if you have an allergy to CHG or antibacterial soaps.  If your skin becomes reddened/irritated stop using the CHG and inform your nurse when  you arrive at Short Stay. Do not shave (including legs and underarms) for at least 48 hours prior to the first CHG shower.  You may shave your face/neck. Please follow these instructions carefully:  1.  Shower with CHG Soap the night before surgery and the  morning of Surgery.  2.  If you choose to wash your hair, wash your hair first as usual with your  normal  shampoo.  3.  After you shampoo, rinse your hair and body thoroughly to remove the  shampoo.                           4.  Use CHG as you would any other liquid soap.  You can apply chg directly  to the skin and wash                       Gently with a  scrungie or clean washcloth.  5.  Apply the CHG Soap to your body ONLY FROM THE NECK DOWN.   Do not use on face/ open                           Wound or open sores. Avoid contact with eyes, ears mouth and genitals (private parts).                       Wash face,  Genitals (private parts) with your normal soap.             6.  Wash thoroughly, paying special attention to the area where your surgery  will be performed.  7.  Thoroughly rinse your body with warm water from the neck down.  8.  DO NOT shower/wash with your normal soap after using and rinsing off  the CHG Soap.                9.  Pat yourself dry with a clean towel.            10.  Wear clean pajamas.            11.  Place clean sheets on your bed the night of your first shower and do not  sleep with pets. Day of Surgery : Do not apply any lotions/deodorants the morning of surgery.  Please wear clean clothes to the hospital/surgery center.  FAILURE TO FOLLOW THESE INSTRUCTIONS MAY RESULT IN THE CANCELLATION OF YOUR SURGERY PATIENT SIGNATURE_________________________________  NURSE SIGNATURE__________________________________  ________________________________________________________________________

## 2015-01-09 LAB — HEMOGLOBIN A1C
HEMOGLOBIN A1C: 7.1 % — AB (ref 4.8–5.6)
MEAN PLASMA GLUCOSE: 157 mg/dL

## 2015-01-09 MED ORDER — METFORMIN HCL 500 MG PO TABS: 500.0000 mg | ORAL_TABLET | Freq: Two times a day (BID) | ORAL | Status: DC

## 2015-01-09 NOTE — Telephone Encounter (Signed)
Patient states that she will discuss with them about the the ramipril, but she cannot crush the metformin ER due to it being extended release. Will you prescribe regular metformin?

## 2015-01-09 NOTE — Telephone Encounter (Signed)
Please prescribe regular metformin

## 2015-01-09 NOTE — Telephone Encounter (Signed)
Regular metformin sent to pharmacy at 500mg  one tab twice daily. Instructed to watch sugars per dr. Lenord Fellersbaxley as she may not need it. Patient informed.

## 2015-01-09 NOTE — Progress Notes (Signed)
A1C results in epic per PAT visit 01/08/2015 sent to Dr Daphine DeutscherMartin

## 2015-01-10 NOTE — Anesthesia Preprocedure Evaluation (Addendum)
Anesthesia Evaluation  Patient identified by MRN, date of birth, ID band Patient awake    Reviewed: Allergy & Precautions, H&P , Patient's Chart, lab work & pertinent test results, reviewed documented beta blocker date and time   Airway Mallampati: IV  TM Distance: >3 FB Neck ROM: full    Dental no notable dental hx.    Pulmonary former smoker,    Pulmonary exam normal breath sounds clear to auscultation       Cardiovascular hypertension,  Rhythm:regular Rate:Normal     Neuro/Psych    GI/Hepatic   Endo/Other  diabetes  Renal/GU      Musculoskeletal   Abdominal   Peds  Hematology   Anesthesia Other Findings    Cardiac clearance Okay  Small mouth, No Hx of problems with GA  Hypertension; On meds, EKG lat flat twaves      Diabetes mellitus  On insulin   OSA (obstructive sleep apnea)  on CPAP     History of migraine headaches   Neck pain, chronic        Complication of anesthesia      GERD    History of hiatal hernia    Headache  no issues since stop caffeine       Reproductive/Obstetrics                           Anesthesia Physical Anesthesia Plan  ASA: III  Anesthesia Plan: General   Post-op Pain Management:    Induction: Intravenous  Airway Management Planned: Oral ETT and Video Laryngoscope Planned  Additional Equipment:   Intra-op Plan:   Post-operative Plan: Extubation in OR  Informed Consent: I have reviewed the patients History and Physical, chart, labs and discussed the procedure including the risks, benefits and alternatives for the proposed anesthesia with the patient or authorized representative who has indicated his/her understanding and acceptance.   Dental Advisory Given and Dental advisory given  Plan Discussed with: CRNA and Surgeon  Anesthesia Plan Comments: (  Discussed general anesthesia, including possible nausea, instrumentation of airway,  sore throat,pulmonary aspiration, etc. I asked if the were any outstanding questions, or  concerns before we proceeded. )        Anesthesia Quick Evaluation

## 2015-01-12 ENCOUNTER — Inpatient Hospital Stay (HOSPITAL_COMMUNITY): Payer: BLUE CROSS/BLUE SHIELD | Admitting: Anesthesiology

## 2015-01-12 ENCOUNTER — Encounter (HOSPITAL_COMMUNITY): Payer: Self-pay | Admitting: *Deleted

## 2015-01-12 ENCOUNTER — Inpatient Hospital Stay (HOSPITAL_COMMUNITY)
Admission: RE | Admit: 2015-01-12 | Discharge: 2015-01-14 | DRG: 621 | Disposition: A | Discharge status: Home / Self Care | Payer: BLUE CROSS/BLUE SHIELD | Source: Ambulatory Visit | Attending: Surgery | Admitting: Surgery

## 2015-01-12 ENCOUNTER — Encounter (HOSPITAL_COMMUNITY)
Admission: RE | Disposition: A | Discharge status: Home / Self Care | Payer: Self-pay | Source: Ambulatory Visit | Attending: Surgery

## 2015-01-12 DIAGNOSIS — Z79899 Other long term (current) drug therapy: Secondary | ICD-10-CM | POA: Diagnosis not present

## 2015-01-12 DIAGNOSIS — E119 Type 2 diabetes mellitus without complications: Secondary | ICD-10-CM | POA: Diagnosis present

## 2015-01-12 DIAGNOSIS — K449 Diaphragmatic hernia without obstruction or gangrene: Secondary | ICD-10-CM | POA: Diagnosis present

## 2015-01-12 DIAGNOSIS — Z7984 Long term (current) use of oral hypoglycemic drugs: Secondary | ICD-10-CM

## 2015-01-12 DIAGNOSIS — Z6837 Body mass index (BMI) 37.0-37.9, adult: Secondary | ICD-10-CM | POA: Diagnosis not present

## 2015-01-12 DIAGNOSIS — Z9884 Bariatric surgery status: Secondary | ICD-10-CM

## 2015-01-12 HISTORY — PX: LAPAROSCOPIC ROUX-EN-Y GASTRIC BYPASS WITH HIATAL HERNIA REPAIR: SHX6513

## 2015-01-12 LAB — CBC
HCT: 40 % (ref 36.0–46.0)
HEMOGLOBIN: 13.1 g/dL (ref 12.0–15.0)
MCH: 29.6 pg (ref 26.0–34.0)
MCHC: 32.8 g/dL (ref 30.0–36.0)
MCV: 90.5 fL (ref 78.0–100.0)
PLATELETS: 285 10*3/uL (ref 150–400)
RBC: 4.42 MIL/uL (ref 3.87–5.11)
RDW: 13.5 % (ref 11.5–15.5)
WBC: 11.1 10*3/uL — ABNORMAL HIGH (ref 4.0–10.5)

## 2015-01-12 LAB — GLUCOSE, CAPILLARY
GLUCOSE-CAPILLARY: 203 mg/dL — AB (ref 65–99)
GLUCOSE-CAPILLARY: 177 mg/dL — AB (ref 65–99)
GLUCOSE-CAPILLARY: 137 mg/dL — AB (ref 65–99)
Glucose-Capillary: 111 mg/dL — ABNORMAL HIGH (ref 65–99)
Glucose-Capillary: 114 mg/dL — ABNORMAL HIGH (ref 65–99)

## 2015-01-12 LAB — HEMOGLOBIN AND HEMATOCRIT, BLOOD
HCT: 40.3 % (ref 36.0–46.0)
Hemoglobin: 13.2 g/dL (ref 12.0–15.0)

## 2015-01-12 LAB — CREATININE, SERUM
CREATININE: 0.97 mg/dL (ref 0.44–1.00)
GFR calc Af Amer: >60 mL/min (ref >60)

## 2015-01-12 SURGERY — CREATION, GASTRIC BYPASS, LAPAROSCOPIC, USING ROUX-EN-Y GASTROENTEROSTOMY, WITH HIATAL HERNIA REPAIR
Anesthesia: General

## 2015-01-12 MED ORDER — DEXAMETHASONE SODIUM PHOSPHATE 10 MG/ML IJ SOLN
INTRAMUSCULAR | Status: AC
  Filled 2015-01-12: qty 1

## 2015-01-12 MED ORDER — LACTATED RINGERS IR SOLN
Status: DC | PRN
  Administered 2015-01-12: 1

## 2015-01-12 MED ORDER — HYDROCODONE-ACETAMINOPHEN 7.5-325 MG PO TABS: 1.0000 | ORAL_TABLET | Freq: Once | ORAL | Status: DC | PRN

## 2015-01-12 MED ORDER — ROCURONIUM BROMIDE 100 MG/10ML IV SOLN
INTRAVENOUS | Status: DC | PRN
  Administered 2015-01-12: 40 mg via INTRAVENOUS
  Administered 2015-01-12: 20 mg via INTRAVENOUS
  Administered 2015-01-12 (×2): 10 mg via INTRAVENOUS
  Administered 2015-01-12: 20 mg via INTRAVENOUS
  Administered 2015-01-12: 10 mg via INTRAVENOUS

## 2015-01-12 MED ORDER — ACETAMINOPHEN 160 MG/5ML PO SOLN: 650.0000 mg | ORAL | Status: DC | PRN

## 2015-01-12 MED ORDER — FENTANYL CITRATE (PF) 100 MCG/2ML IJ SOLN
50.0000 ug | INTRAMUSCULAR | Status: DC | PRN
  Administered 2015-01-12 – 2015-01-13 (×17): 50 ug via INTRAVENOUS
  Filled 2015-01-12 (×16): qty 2

## 2015-01-12 MED ORDER — HEPARIN SODIUM (PORCINE) 5000 UNIT/ML IJ SOLN
5000.0000 [IU] | INTRAMUSCULAR | Status: AC
  Administered 2015-01-12: 5000 [IU] via SUBCUTANEOUS
  Filled 2015-01-12: qty 1

## 2015-01-12 MED ORDER — HYDROMORPHONE HCL 1 MG/ML IJ SOLN
INTRAMUSCULAR | Status: AC
  Filled 2015-01-12: qty 1

## 2015-01-12 MED ORDER — ROCURONIUM BROMIDE 100 MG/10ML IV SOLN
INTRAVENOUS | Status: AC
  Filled 2015-01-12: qty 1

## 2015-01-12 MED ORDER — CEFOXITIN SODIUM 2 G IV SOLR
INTRAVENOUS | Status: AC
  Filled 2015-01-12: qty 2

## 2015-01-12 MED ORDER — SODIUM CHLORIDE 0.9 % IJ SOLN
INTRAMUSCULAR | Status: AC
  Filled 2015-01-12: qty 20

## 2015-01-12 MED ORDER — CHLORHEXIDINE GLUCONATE CLOTH 2 % EX PADS: 6.0000 | MEDICATED_PAD | Freq: Once | CUTANEOUS | Status: DC

## 2015-01-12 MED ORDER — ONDANSETRON HCL 4 MG/2ML IJ SOLN
INTRAMUSCULAR | Status: DC | PRN
  Administered 2015-01-12: 4 mg via INTRAVENOUS

## 2015-01-12 MED ORDER — ACETAMINOPHEN 160 MG/5ML PO SOLN: 975.0000 mg | Freq: Once | ORAL | Status: DC

## 2015-01-12 MED ORDER — MORPHINE SULFATE (PF) 2 MG/ML IV SOLN: 2.0000 mg | INTRAVENOUS | Status: DC | PRN

## 2015-01-12 MED ORDER — FENTANYL CITRATE (PF) 100 MCG/2ML IJ SOLN
INTRAMUSCULAR | Status: DC | PRN
  Administered 2015-01-12: 50 ug via INTRAVENOUS
  Administered 2015-01-12: 100 ug via INTRAVENOUS
  Administered 2015-01-12: 50 ug via INTRAVENOUS
  Administered 2015-01-12: 100 ug via INTRAVENOUS
  Administered 2015-01-12: 50 ug via INTRAVENOUS

## 2015-01-12 MED ORDER — LABETALOL HCL 5 MG/ML IV SOLN
INTRAVENOUS | Status: DC | PRN
  Administered 2015-01-12: 5 mg via INTRAVENOUS

## 2015-01-12 MED ORDER — DEXTROSE 5 % IV SOLN
INTRAVENOUS | Status: AC
  Filled 2015-01-12: qty 2

## 2015-01-12 MED ORDER — SUGAMMADEX SODIUM 500 MG/5ML IV SOLN
INTRAVENOUS | Status: AC
  Filled 2015-01-12: qty 5

## 2015-01-12 MED ORDER — LABETALOL HCL 5 MG/ML IV SOLN
INTRAVENOUS | Status: AC
  Filled 2015-01-12: qty 4

## 2015-01-12 MED ORDER — LIDOCAINE HCL (CARDIAC) 20 MG/ML IV SOLN
INTRAVENOUS | Status: AC
  Filled 2015-01-12: qty 5

## 2015-01-12 MED ORDER — FENTANYL CITRATE (PF) 100 MCG/2ML IJ SOLN: 50.0000 ug | INTRAMUSCULAR | Status: DC | PRN

## 2015-01-12 MED ORDER — PROPOFOL 10 MG/ML IV BOLUS
INTRAVENOUS | Status: AC
  Filled 2015-01-12: qty 20

## 2015-01-12 MED ORDER — LIDOCAINE HCL (CARDIAC) 20 MG/ML IV SOLN
INTRAVENOUS | Status: DC | PRN
  Administered 2015-01-12: 100 mg via INTRAVENOUS

## 2015-01-12 MED ORDER — ONDANSETRON HCL 4 MG/2ML IJ SOLN
4.0000 mg | INTRAMUSCULAR | Status: DC | PRN
  Administered 2015-01-13: 4 mg via INTRAVENOUS
  Filled 2015-01-12: qty 2

## 2015-01-12 MED ORDER — LACTATED RINGERS IV SOLN
INTRAVENOUS | Status: DC | PRN
  Administered 2015-01-12 (×2): via INTRAVENOUS

## 2015-01-12 MED ORDER — OXYCODONE HCL 5 MG/5ML PO SOLN
5.0000 mg | ORAL | Status: DC | PRN
  Administered 2015-01-13: 10 mg via ORAL
  Administered 2015-01-13 (×2): 5 mg via ORAL
  Administered 2015-01-13 – 2015-01-14 (×4): 10 mg via ORAL
  Filled 2015-01-12 (×3): qty 10
  Filled 2015-01-12 (×2): qty 5
  Filled 2015-01-12 (×2): qty 10

## 2015-01-12 MED ORDER — EVICEL 5 ML EX KIT
PACK | CUTANEOUS | Status: DC | PRN
  Administered 2015-01-12: 1 via TOPICAL

## 2015-01-12 MED ORDER — ONDANSETRON HCL 4 MG/2ML IJ SOLN
INTRAMUSCULAR | Status: AC
  Filled 2015-01-12: qty 2

## 2015-01-12 MED ORDER — SUCCINYLCHOLINE CHLORIDE 20 MG/ML IJ SOLN
INTRAMUSCULAR | Status: DC | PRN
  Administered 2015-01-12: 100 mg via INTRAVENOUS

## 2015-01-12 MED ORDER — BUPIVACAINE LIPOSOME 1.3 % IJ SUSP
20.0000 mL | Freq: Once | INTRAMUSCULAR | Status: AC
Start: 2015-01-12 — End: 2015-01-12
  Administered 2015-01-12: 20 mL
  Filled 2015-01-12: qty 20

## 2015-01-12 MED ORDER — FENTANYL CITRATE (PF) 100 MCG/2ML IJ SOLN
INTRAMUSCULAR | Status: AC
  Administered 2015-01-12: 50 ug
  Filled 2015-01-12: qty 2

## 2015-01-12 MED ORDER — MIDAZOLAM HCL 5 MG/5ML IJ SOLN
INTRAMUSCULAR | Status: DC | PRN
  Administered 2015-01-12: 2 mg via INTRAVENOUS

## 2015-01-12 MED ORDER — ACETAMINOPHEN 160 MG/5ML PO SOLN: 325.0000 mg | ORAL | Status: DC | PRN

## 2015-01-12 MED ORDER — FENTANYL CITRATE (PF) 100 MCG/2ML IJ SOLN
INTRAMUSCULAR | Status: AC
  Filled 2015-01-12: qty 2

## 2015-01-12 MED ORDER — SCOPOLAMINE 1 MG/3DAYS TD PT72
MEDICATED_PATCH | TRANSDERMAL | Status: DC | PRN
  Administered 2015-01-12: 1 via TRANSDERMAL

## 2015-01-12 MED ORDER — SCOPOLAMINE 1 MG/3DAYS TD PT72
MEDICATED_PATCH | TRANSDERMAL | Status: AC
  Filled 2015-01-12: qty 1

## 2015-01-12 MED ORDER — FENTANYL CITRATE (PF) 250 MCG/5ML IJ SOLN
INTRAMUSCULAR | Status: AC
  Filled 2015-01-12: qty 5

## 2015-01-12 MED ORDER — DEXTROSE 5 % IV SOLN
2.0000 g | INTRAVENOUS | Status: AC
  Administered 2015-01-12 (×2): 2 g via INTRAVENOUS

## 2015-01-12 MED ORDER — PROPOFOL 10 MG/ML IV BOLUS
INTRAVENOUS | Status: DC | PRN
  Administered 2015-01-12: 200 mg via INTRAVENOUS

## 2015-01-12 MED ORDER — TISSEEL VH 10 ML EX KIT
PACK | CUTANEOUS | Status: AC
  Filled 2015-01-12: qty 2

## 2015-01-12 MED ORDER — SUGAMMADEX SODIUM 200 MG/2ML IV SOLN
INTRAVENOUS | Status: DC | PRN
  Administered 2015-01-12: 225 mg via INTRAVENOUS

## 2015-01-12 MED ORDER — PANTOPRAZOLE SODIUM 40 MG IV SOLR
40.0000 mg | Freq: Every day | INTRAVENOUS | Status: DC
  Administered 2015-01-12 – 2015-01-13 (×2): 40 mg via INTRAVENOUS
  Filled 2015-01-12 (×3): qty 40

## 2015-01-12 MED ORDER — INSULIN ASPART 100 UNIT/ML ~~LOC~~ SOLN
0.0000 [IU] | SUBCUTANEOUS | Status: DC
  Administered 2015-01-12: 4 [IU] via SUBCUTANEOUS
  Administered 2015-01-12: 3 [IU] via SUBCUTANEOUS

## 2015-01-12 MED ORDER — HYDROMORPHONE HCL 1 MG/ML IJ SOLN
0.2500 mg | INTRAMUSCULAR | Status: DC | PRN
  Administered 2015-01-12: 0.5 mg via INTRAVENOUS
  Administered 2015-01-12: 50 mg via INTRAVENOUS
  Administered 2015-01-12 (×2): 0.5 mg via INTRAVENOUS

## 2015-01-12 MED ORDER — POTASSIUM CHLORIDE IN NACL 20-0.45 MEQ/L-% IV SOLN
INTRAVENOUS | Status: DC
  Administered 2015-01-12: 13:00:00 via INTRAVENOUS
  Administered 2015-01-12 – 2015-01-14 (×2): 100 mL/h via INTRAVENOUS
  Filled 2015-01-12 (×10): qty 1000

## 2015-01-12 MED ORDER — MIDAZOLAM HCL 2 MG/2ML IJ SOLN
INTRAMUSCULAR | Status: AC
  Filled 2015-01-12: qty 2

## 2015-01-12 MED ORDER — HEPARIN SODIUM (PORCINE) 5000 UNIT/ML IJ SOLN
5000.0000 [IU] | Freq: Three times a day (TID) | INTRAMUSCULAR | Status: DC
  Administered 2015-01-12 – 2015-01-14 (×5): 5000 [IU] via SUBCUTANEOUS
  Filled 2015-01-12 (×8): qty 1

## 2015-01-12 MED ORDER — DEXAMETHASONE SODIUM PHOSPHATE 10 MG/ML IJ SOLN
INTRAMUSCULAR | Status: DC | PRN
  Administered 2015-01-12: 10 mg via INTRAVENOUS

## 2015-01-12 MED ORDER — PREMIER PROTEIN SHAKE
2.0000 [oz_av] | Freq: Four times a day (QID) | ORAL | Status: DC
  Administered 2015-01-14 (×2): 2 [oz_av] via ORAL

## 2015-01-12 SURGICAL SUPPLY — 78 items
APL SKNCLS STERI-STRIP NONHPOA (GAUZE/BANDAGES/DRESSINGS)
APL SRG 32X5 SNPLK LF DISP (MISCELLANEOUS) ×2
APPLICATOR COTTON TIP 6IN STRL (MISCELLANEOUS) ×2 IMPLANT
APPLIER CLIP ROT 10 11.4 M/L (STAPLE)
APPLIER CLIP ROT 13.4 12 LRG (CLIP) ×2
APR CLP LRG 13.4X12 ROT 20 MLT (CLIP) ×1
APR CLP MED LRG 11.4X10 (STAPLE)
BENZOIN TINCTURE PRP APPL 2/3 (GAUZE/BANDAGES/DRESSINGS) IMPLANT
BLADE SURG 15 STRL LF DISP TIS (BLADE) ×1 IMPLANT
BLADE SURG 15 STRL SS (BLADE) ×2
CABLE HIGH FREQUENCY MONO STRZ (ELECTRODE) IMPLANT
CLIP APPLIE ROT 10 11.4 M/L (STAPLE) IMPLANT
CLIP APPLIE ROT 13.4 12 LRG (CLIP) IMPLANT
CLIP SUT LAPRA TY ABSORB (SUTURE) ×3 IMPLANT
COVER SURGICAL LIGHT HANDLE (MISCELLANEOUS) ×2 IMPLANT
DEVICE SUT QUICK LOAD TK 5 (STAPLE) IMPLANT
DEVICE SUT TI-KNOT TK 5X26 (MISCELLANEOUS) ×1 IMPLANT
DEVICE SUTURE ENDOST 10MM (ENDOMECHANICALS) ×2 IMPLANT
DISSECTOR BLUNT TIP ENDO 5MM (MISCELLANEOUS) ×1 IMPLANT
DRAIN PENROSE 18X1/4 LTX STRL (WOUND CARE) ×2 IMPLANT
DRAPE CAMERA CLOSED 9X96 (DRAPES) ×2 IMPLANT
ELECT L-HOOK LAP 45CM DISP (ELECTROSURGICAL) ×2
ELECT PENCIL ROCKER SW 15FT (MISCELLANEOUS) ×2 IMPLANT
ELECTRODE L-HOOK LAP 45CM DISP (ELECTROSURGICAL) ×1 IMPLANT
GAUZE SPONGE 4X4 12PLY STRL (GAUZE/BANDAGES/DRESSINGS) IMPLANT
GAUZE SPONGE 4X4 16PLY XRAY LF (GAUZE/BANDAGES/DRESSINGS) ×2 IMPLANT
GLOVE BIOGEL M 8.0 STRL (GLOVE) ×2 IMPLANT
GOWN STRL REUS W/TWL XL LVL3 (GOWN DISPOSABLE) ×8 IMPLANT
HANDLE STAPLE EGIA 4 XL (STAPLE) ×2 IMPLANT
HOVERMATT SINGLE USE (MISCELLANEOUS) ×2 IMPLANT
KIT BASIN OR (CUSTOM PROCEDURE TRAY) ×2 IMPLANT
KIT GASTRIC LAVAGE 34FR ADT (SET/KITS/TRAYS/PACK) ×2 IMPLANT
MARKER SKIN DUAL TIP RULER LAB (MISCELLANEOUS) ×2 IMPLANT
NDL SPNL 22GX3.5 QUINCKE BK (NEEDLE) ×1 IMPLANT
NEEDLE SPNL 22GX3.5 QUINCKE BK (NEEDLE) ×2 IMPLANT
PACK CARDIOVASCULAR III (CUSTOM PROCEDURE TRAY) ×2 IMPLANT
RELOAD EGIA 45 MED/THCK PURPLE (STAPLE) IMPLANT
RELOAD EGIA 45 TAN VASC (STAPLE) IMPLANT
RELOAD EGIA 60 MED/THCK PURPLE (STAPLE) ×12 IMPLANT
RELOAD EGIA 60 TAN VASC (STAPLE) IMPLANT
RELOAD ENDO STITCH 2.0 (ENDOMECHANICALS) ×18
RELOAD STAPLE 45 PURP MED/THCK (STAPLE) IMPLANT
RELOAD STAPLE 60 MED/THCK ART (STAPLE) IMPLANT
RELOAD SUT SNGL STCH ABSRB 2-0 (ENDOMECHANICALS) ×5 IMPLANT
RELOAD SUT SNGL STCH BLK 2-0 (ENDOMECHANICALS) ×4 IMPLANT
RELOAD TRI 45 ART MED THCK PUR (STAPLE) ×4 IMPLANT
SCISSORS LAP 5X45 EPIX DISP (ENDOMECHANICALS) ×3 IMPLANT
SCRUB PCMX 4 OZ (MISCELLANEOUS) ×2 IMPLANT
SEALANT SURGICAL APPL DUAL CAN (MISCELLANEOUS) ×3 IMPLANT
SET IRRIG TUBING LAPAROSCOPIC (IRRIGATION / IRRIGATOR) ×2 IMPLANT
SHEARS HARMONIC ACE PLUS 45CM (MISCELLANEOUS) ×2 IMPLANT
SLEEVE ADV FIXATION 12X100MM (TROCAR) ×6 IMPLANT
SLEEVE ADV FIXATION 5X100MM (TROCAR) ×2 IMPLANT
SOLUTION ANTI FOG 6CC (MISCELLANEOUS) ×2 IMPLANT
STAPLER VISISTAT 35W (STAPLE) ×2 IMPLANT
STRIP CLOSURE SKIN 1/2X4 (GAUZE/BANDAGES/DRESSINGS) IMPLANT
SUT RELOAD ENDO STITCH 2 48X1 (ENDOMECHANICALS) ×5
SUT RELOAD ENDO STITCH 2.0 (ENDOMECHANICALS) ×4
SUT SURGIDAC NAB ES-9 0 48 120 (SUTURE) IMPLANT
SUT VIC AB 2-0 SH 27 (SUTURE) ×2
SUT VIC AB 2-0 SH 27X BRD (SUTURE) ×1 IMPLANT
SUT VIC AB 4-0 SH 18 (SUTURE) ×2 IMPLANT
SUTURE RELOAD END STTCH 2 48X1 (ENDOMECHANICALS) ×5 IMPLANT
SUTURE RELOAD ENDO STITCH 2.0 (ENDOMECHANICALS) ×4 IMPLANT
SYR 10ML ECCENTRIC (SYRINGE) ×2 IMPLANT
SYR 20CC LL (SYRINGE) ×4 IMPLANT
SYR 50ML LL SCALE MARK (SYRINGE) ×2 IMPLANT
TOWEL OR 17X26 10 PK STRL BLUE (TOWEL DISPOSABLE) ×4 IMPLANT
TOWEL OR NON WOVEN STRL DISP B (DISPOSABLE) ×2 IMPLANT
TRAY FOLEY W/METER SILVER 14FR (SET/KITS/TRAYS/PACK) ×2 IMPLANT
TRAY FOLEY W/METER SILVER 16FR (SET/KITS/TRAYS/PACK) ×1 IMPLANT
TROCAR ADV FIXATION 12X100MM (TROCAR) ×2 IMPLANT
TROCAR ADV FIXATION 5X100MM (TROCAR) ×2 IMPLANT
TROCAR BLADELESS OPT 5 100 (ENDOMECHANICALS) ×2 IMPLANT
TROCAR XCEL 12X100 BLDLESS (ENDOMECHANICALS) ×2 IMPLANT
TUBING CONNECTING 10 (TUBING) ×2 IMPLANT
TUBING ENDO SMARTCAP PENTAX (MISCELLANEOUS) ×2 IMPLANT
TUBING FILTER THERMOFLATOR (ELECTROSURGICAL) ×2 IMPLANT

## 2015-01-12 NOTE — Transfer of Care (Signed)
Immediate Anesthesia Transfer of Care Note  Patient: Melissa Benson  Procedure(s) Performed: Procedure(s): LAPAROSCOPIC ROUX-EN-Y GASTRIC BYPASS WITH LYSIS OF ADHESIONS  (N/A)  Patient Location: PACU  Anesthesia Type:General  Level of Consciousness: sedated  Airway & Oxygen Therapy: Patient Spontanous Breathing and Patient connected to face mask oxygen  Post-op Assessment: Report given to RN and Post -op Vital signs reviewed and stable  Post vital signs: Reviewed and stable  Last Vitals:  Filed Vitals:   01/12/15 0537  BP: 169/76  Pulse: 109  Temp: 36.6 C  Resp: 20    Complications: No apparent anesthesia complications

## 2015-01-12 NOTE — Progress Notes (Signed)
RT placed patient on CPAP. Patient home setting is 8 cmH2O. Sterile water was added to water chamber for humidification. Patient is tolerating well. RT will continue to monitor.

## 2015-01-12 NOTE — H&P (View-Only) (Signed)
Melissa Benson 12/31/2014 12:22 PM Location: [facility] Patient #: [id number] DOB: 01/25/1960 Married / Language: English / Race: White Female  History of Present Illness (Brynli Ollis B. Alanta Scobey MD; 12/31/2014 12:45 PM) The patient is a 54 year old female who presents for a bariatric surgery evaluation. This is a preop visit for Melissa Benson who is scheduled for a Roux-en-Y gastric bypass on January 9. I have secondarily appropriate with her possibility of a sleeve gastrectomy. This is because she's had a prior open cholecystectomy, appendectomy, and an exploratory laparotomy with hysterectomy. Her permanent will therefore say Roux-en-Y gastric bypass possible sleeve gastrectomy.  On her upper GI showed a small sliding-type hiatal hernia. She really didn't have much in way of reflux. Today's BMI is 37 with a weight of 231. She is diabetic. Her primary care physician is Dr. Mary John Baxley.   Allergies (Ashley Beck, CMA; 12/31/2014 12:22 PM) No Known Drug Allergies 10/16/2014  Medication History (Ashley Beck, CMA; 12/31/2014 12:23 PM) OxyCODONE HCl (5MG/5ML Solution, 5-10 Milliliter Oral every four hours, as needed, Taken starting 12/31/2014) Active. Protonix (40MG Tablet DR, 1 (one) Tablet Oral daily, Taken starting 12/31/2014) Active. Bayer Contour Next Test (In Vitro) Active. Crestor (10MG Tablet, Oral daily) Active. AmLODIPine Besylate (5MG Tablet, Oral daily) Active. MetFORMIN HCl ER (500MG Tablet ER 24HR, Oral daily) Active. Promethazine HCl (25MG Tablet, Oral prn nausea) Active. Ramipril (10MG Capsule, Oral daily) Active. Lantus SoloStar (100UNIT/ML Soln Pen-inj, Subcutaneous) Active. Saxenda (18MG/3ML Soln Pen-inj, Subcutaneous) Active. Victoza (18MG/3ML Soln Pen-inj, Subcutaneous) Active. Meclizine HCl (25MG Tablet, Oral) Active. Invokana (300MG Tablet, Oral) Active. Fiber (Oral) Active. Colace (100MG Capsule, Oral) Active. Medications  Reconciled    Vitals (Ashley Beck CMA; 12/31/2014 12:23 PM) 12/31/2014 12:23 PM Weight: 231 lb Height: 66in Body Surface Area: 2.13 m Body Mass Index: 37.28 kg/m  Temp.: 99F(Temporal)  Pulse: 105 (Regular)  BP: 142/86 (Sitting, Left Arm, Standard)      Physical Exam (Treavor Blomquist B. Aniello Christopoulos MD; 12/31/2014 12:46 PM)  The physical exam findings are as follows: Note:Moderately obese white female in no acute distress Head normocephalic, sclera nonicteric, pupils equal round and reactive to light Neck prior anterior and posterior plates for partial cervical fusion. Chest clear to auscultation Heart sinus rhythm without murmurs or gallops Abdomen open transverse incision the right upper quadrant gallbladder. Lower midline incision for hysterectomy. Extremities full range of motion Neuro alert and oriented 3. Motor and sensory function grossly intact.    Assessment & Plan (Ashton Belote B. Vasco Chong MD; 12/31/2014 12:47 PM)  SEVERE OBESITY (BMI 35.0-35.9 WITH COMORBIDITY) (Z68.35)  Plan roux en Y gastric bypass possible sleeve gastrectomy.    

## 2015-01-12 NOTE — Op Note (Signed)
Surgeon: Pollyann SavoyMatt B. Daphine DeutscherMartin, MD, FACS Asst:  Gaynelle AduEric Wilson, MD, FACS Anesthesia: General endotracheal Drains: None  Procedure: Laparoscopic Roux en Y gastric bypass with 40 cm BP limb and 100 cm Roux limb, antecolic, antegastric, candy cane to the left.  Closure of Peterson's defect. Upper endoscopy.   Description of Procedure:  [facility] and given [facility].  The abdomen was prepped with PCMX and draped sterilely.  A time out was performed.  The abdomen was entered with a 12 mm Optiview through the left upper quadrant without difficulty.  She had many adhesions to the anterior abdominal wall and down in the pelvis.  These were taken down with sharp dissection and with the Harmonic scalpel.  We decided that we could proceed with the roux Y gastric bypass.    The operation began by identifying the ligament of Treitz. I measured 40 cm downstream and divided the bowel with a 6 cm Covidian stapler.  I sutured a Penrose drain along the Roux limb end.  I measured a 1 meter (100 cm) Roux limb and then placed the distal bowels to the BP limb side by side and performed a stapled jejunojejunostomy. The common defect was closed from either end with 4-0 Vicryl using the Endo Stitch. The mesenteric defect was closed with a running 2-0 silk using the Endo Stitch. Tisseel was applied to the suture line.  The omentum was divided with the harmonic scalpel.  The Nathanson retractor was inserted in the left lateral segment of liver was retracted. The foregut dissection ensued.  I measured 5 cm along the lessor curvature and entered the retrogastric space.  The pouch was create by first applying two firings of the Covidien 6 cm without TRS followed by two firings with TRS.  The spleen was intimately attached to the stomach posteriorly and two clips were placed on a short gastric that bled.    The Roux limb was then brought up with the candycane pointed left and a back row of sutures of 2-0  Vicryl were placed. I opened along the right side of each structure and inserted the 4.5 cm stapler to create the gastrojejunostomy. The common defect was closed from either end with 2-0 Vicryl and a second row was placed anterior to that the Ewald tube acting as a stent across the anastomosis. The Penrose drain was removed. Peterson's defect was closed with 2-0 silk.   Endoscopy was performed by Dr. Andrey CampanileWilson and this showed a 4 cm long pouch.  No leaks or bleeding were seen.    The incisions were injected with [place] were closed with 4-0 Vicryl and Liquiban.    The patient was taken to the recovery room in satisfactory condition.  Matt B. Daphine DeutscherMartin, MD, FACS

## 2015-01-12 NOTE — Interval H&P Note (Signed)
History and Physical Interval Note:  01/12/2015 7:13 AM  Melissa MccreedyKathy M Lebeda  has presented today for surgery, with the diagnosis of Morbid Obesity  The various methods of treatment have been discussed with the patient and family. After consideration of risks, benefits and other options for treatment, the patient has consented to  Procedure(s): LAPAROSCOPIC ROUX-EN-Y GASTRIC BYPASS WITH HIATAL HERNIA REPAIR (N/A) as a surgical intervention .  The patient's history has been reviewed, patient examined, no change in status, stable for surgery.  I have reviewed the patient's chart and labs.  Questions were answered to the patient's satisfaction.     Heily Carlucci B

## 2015-01-12 NOTE — Op Note (Signed)
Cleda MccreedyKathy M Drudge [id number] [birth year] 01/12/2015  Preoperative diagnosis: morbid obesity  Postoperative diagnosis: Same   Procedure: Upper endoscopy   Surgeon: Atilano InaEric M Shubh Chiara M.D., FACS   Anesthesia: Gen.   Indications for procedure: 55 y.o. yo female undergoing a laparoscopic roux en y gastric bypass and an upper endoscopy was requested to evaluate the anastomosis.  Description of procedure: After we have completed the new gastrojejunostomy, I scrubbed out and obtained the Olympus endoscope. I gently placed endoscope in the patient's oropharynx and gently glided it down the esophagus without any difficulty under direct visualization. Once I was in the gastric pouch, I insufflated the pouch was air. The pouch was approximately 4 cm in size. I was able to cannulate and advanced the scope through the gastrojejunostomy. Dr. Daphine DeutscherMartin had placed saline in the upper abdomen. Upon further insufflation of the gastric pouch there was no evidence of bubbles. Upon further inspection of the gastric pouch, the mucosa appeared normal. There is no evidence of any mucosal abnormality. The gastric pouch and Roux limb were decompressed. The width of the gastrojejunal anastomosis was at least 2.5 cm. The scope was withdrawn. The patient tolerated this portion of the procedure well. Please see Dr Ermalene SearingMartin's operative note for details regarding the laparoscopic roux-en-y gastric bypass.  Mary SellaEric M. Andrey CampanileWilson, MD, FACS General, Bariatric, & Minimally Invasive Surgery [facility] [place]

## 2015-01-12 NOTE — Anesthesia Procedure Notes (Signed)
Procedure Name: Intubation Date/Time: 01/12/2015 7:20 AM Performed by: Doran ClayALDAY, Joscelin Fray R Pre-anesthesia Checklist: Patient identified, Timeout performed, Emergency Drugs available, Suction available and Patient being monitored Patient Re-evaluated:Patient Re-evaluated prior to inductionOxygen Delivery Method: Circle system utilized Preoxygenation: Pre-oxygenation with 100% oxygen Intubation Type: IV induction Laryngoscope Size: Mac, Glidescope and 4 Grade View: Grade I Tube type: Oral Tube size: 7.0 mm Number of attempts: 1 Airway Equipment and Method: Stylet and Video-laryngoscopy Placement Confirmation: ETT inserted through vocal cords under direct vision,  breath sounds checked- equal and bilateral and positive ETCO2 Secured at: 20 cm Tube secured with: Tape Dental Injury: Teeth and Oropharynx as per pre-operative assessment

## 2015-01-13 ENCOUNTER — Inpatient Hospital Stay (HOSPITAL_COMMUNITY): Payer: BLUE CROSS/BLUE SHIELD

## 2015-01-13 LAB — CBC WITH DIFFERENTIAL/PLATELET
BASOS ABS: 0 10*3/uL (ref 0.0–0.1)
BASOS PCT: 0 %
EOS PCT: 0 %
Eosinophils Absolute: 0 10*3/uL (ref 0.0–0.7)
HCT: 33.4 % — ABNORMAL LOW (ref 36.0–46.0)
Hemoglobin: 10.9 g/dL — ABNORMAL LOW (ref 12.0–15.0)
Lymphocytes Relative: 21 %
Lymphs Abs: 1.7 10*3/uL (ref 0.7–4.0)
MCH: 29.6 pg (ref 26.0–34.0)
MCHC: 32.6 g/dL (ref 30.0–36.0)
MCV: 90.8 fL (ref 78.0–100.0)
MONO ABS: 0.6 10*3/uL (ref 0.1–1.0)
MONOS PCT: 8 %
Neutro Abs: 5.8 10*3/uL (ref 1.7–7.7)
Neutrophils Relative %: 71 %
PLATELETS: 246 10*3/uL (ref 150–400)
RBC: 3.68 MIL/uL — ABNORMAL LOW (ref 3.87–5.11)
RDW: 13.8 % (ref 11.5–15.5)
WBC: 8.2 10*3/uL (ref 4.0–10.5)

## 2015-01-13 LAB — HEMOGLOBIN AND HEMATOCRIT, BLOOD
HEMATOCRIT: 31.8 % — AB (ref 36.0–46.0)
Hemoglobin: 10.4 g/dL — ABNORMAL LOW (ref 12.0–15.0)

## 2015-01-13 LAB — HEMOGLOBIN A1C
HEMOGLOBIN A1C: 6.7 % — AB (ref 4.8–5.6)
MEAN PLASMA GLUCOSE: 146 mg/dL

## 2015-01-13 LAB — GLUCOSE, CAPILLARY
GLUCOSE-CAPILLARY: 102 mg/dL — AB (ref 65–99)
GLUCOSE-CAPILLARY: 101 mg/dL — AB (ref 65–99)
Glucose-Capillary: 104 mg/dL — ABNORMAL HIGH (ref 65–99)
Glucose-Capillary: 113 mg/dL — ABNORMAL HIGH (ref 65–99)
Glucose-Capillary: 114 mg/dL — ABNORMAL HIGH (ref 65–99)

## 2015-01-13 MED ORDER — IOHEXOL 300 MG/ML  SOLN
50.0000 mL | Freq: Once | INTRAMUSCULAR | Status: AC | PRN
  Administered 2015-01-13: 50 mL via ORAL

## 2015-01-13 NOTE — Discharge Instructions (Signed)

## 2015-01-13 NOTE — Plan of Care (Signed)
Problem: Food- and Nutrition-Related Knowledge Deficit (NB-1.1) Goal: Nutrition education Formal process to instruct or train a patient/client in a skill or to impart knowledge to help patients/clients voluntarily manage or modify food choices and eating behavior to maintain or improve health. Outcome: Completed/Met Date Met:  01/13/15 Nutrition Education Note  Received consult for diet education per DROP protocol.   Discussed 2 week post op diet with pt. Emphasized that liquids must be non carbonated, non caffeinated, and sugar free. Fluid goals discussed. Pt to follow up with outpatient bariatric RD for further diet progression after 2 weeks. Multivitamins and minerals also reviewed. Teach back method used, pt expressed understanding, expect good compliance.   Diet: First 2 Weeks  You will see the dietitian about two (2) weeks after your surgery. The dietitian will increase the types of foods you can eat if you are handling liquids well:  If you have severe vomiting or nausea and cannot handle clear liquids lasting longer than 1 day, call your surgeon  Protein Shake  Drink at least 2 ounces of shake 5-6 times per day  Each serving of protein shakes (usually 8 - 12 ounces) should have a minimum of:  15 grams of protein  And no more than 5 grams of carbohydrate  Goal for protein each day:  Men = 80 grams per day  Women = 60 grams per day  Protein powder may be added to fluids such as non-fat milk or Lactaid milk or Soy milk (limit to 35 grams added protein powder per serving)   Hydration  Slowly increase the amount of water and other clear liquids as tolerated (See Acceptable Fluids)  Slowly increase the amount of protein shake as tolerated  Sip fluids slowly and throughout the day  May use sugar substitutes in small amounts (no more than 6 - 8 packets per day; i.e. Splenda)   Fluid Goal  The first goal is to drink at least 8 ounces of protein shake/drink per day (or as directed by the  nutritionist); some examples of protein shakes are [employer], [employer], EAS Edge HP, and Unjury. See handout from pre-op Bariatric Education Class:  Slowly increase the amount of protein shake you drink as tolerated  You may find it easier to slowly sip shakes throughout the day  It is important to get your proteins in first  Your fluid goal is to drink 64 - 100 ounces of fluid daily  It may take a few weeks to build up to this  32 oz (or more) should be clear liquids  And  32 oz (or more) should be full liquids (see below for examples)  Liquids should not contain sugar, caffeine, or carbonation   Clear Liquids:  Water or Sugar-free flavored water (i.e. Fruit H2O, Propel)  Decaffeinated coffee or tea (sugar-free)  Crystal Lite, Wyler's Lite, Minute Maid Lite  Sugar-free Jell-O  Bouillon or broth  Sugar-free Popsicle: *Less than 20 calories each; Limit 1 per day   Full Liquids:  Protein Shakes/Drinks + 2 choices per day of other full liquids  Full liquids must be:  No More Than 12 grams of Carbs per serving  No More Than 3 grams of Fat per serving  Strained low-fat cream soup  Non-Fat milk  Fat-free Lactaid Milk  Sugar-free yogurt (Dannon Lite & Fit, Greek yogurt)     Clayton Bibles, MS, RD, LDN Pager: [phone] After Hours Pager: [phone]

## 2015-01-13 NOTE — Progress Notes (Signed)
Patient alert and oriented, Post op day 1.  Provided support and encouragement.  Encouraged pulmonary toilet, ambulation and small sips of liquids.  All questions answered.  Will continue to monitor. 

## 2015-01-13 NOTE — Progress Notes (Signed)
Patient alert and oriented, pain is controlled. Patient is tolerating fluids, plan to advance to protein shake tomorrow. Reviewed Gastric Bypass discharge instructions with patient and patient is able to articulate understanding. Provided information on BELT program, Support Group and [facility] outpatient pharmacy. All questions answered, will continue to monitor.    

## 2015-01-13 NOTE — Anesthesia Postprocedure Evaluation (Signed)
Anesthesia Post Note  Patient: Melissa Benson  Procedure(s) Performed: Procedure(s) (LRB): LAPAROSCOPIC ROUX-EN-Y GASTRIC BYPASS WITH LYSIS OF ADHESIONS  (N/A)  Patient location during evaluation: PACU Anesthesia Type: General Level of consciousness: sedated Pain management: satisfactory to patient Vital Signs Assessment: post-procedure vital signs reviewed and stable Respiratory status: spontaneous breathing Cardiovascular status: stable Anesthetic complications: no    Last Vitals:  Filed Vitals:   01/13/15 0133 01/13/15 0530  BP: 126/66 106/51  Pulse: 67 69  Temp: 36.4 C 36.7 C  Resp: 16 18    Last Pain:  Filed Vitals:   01/13/15 0843  PainSc: 4                  [name]

## 2015-01-14 LAB — CBC WITH DIFFERENTIAL/PLATELET
Basophils Absolute: 0 10*3/uL (ref 0.0–0.1)
Basophils Relative: 0 %
EOS ABS: 0 10*3/uL (ref 0.0–0.7)
EOS PCT: 0 %
HCT: 30.4 % — ABNORMAL LOW (ref 36.0–46.0)
Hemoglobin: 9.9 g/dL — ABNORMAL LOW (ref 12.0–15.0)
LYMPHS ABS: 2 10*3/uL (ref 0.7–4.0)
Lymphocytes Relative: 27 %
MCH: 29.4 pg (ref 26.0–34.0)
MCHC: 32.6 g/dL (ref 30.0–36.0)
MCV: 90.2 fL (ref 78.0–100.0)
MONOS PCT: 9 %
Monocytes Absolute: 0.6 10*3/uL (ref 0.1–1.0)
Neutro Abs: 4.6 10*3/uL (ref 1.7–7.7)
Neutrophils Relative %: 64 %
PLATELETS: 198 10*3/uL (ref 150–400)
RBC: 3.37 MIL/uL — ABNORMAL LOW (ref 3.87–5.11)
RDW: 14 % (ref 11.5–15.5)
WBC: 7.2 10*3/uL (ref 4.0–10.5)

## 2015-01-14 LAB — GLUCOSE, CAPILLARY
GLUCOSE-CAPILLARY: 84 mg/dL (ref 65–99)
GLUCOSE-CAPILLARY: 97 mg/dL (ref 65–99)
Glucose-Capillary: 105 mg/dL — ABNORMAL HIGH (ref 65–99)
Glucose-Capillary: 104 mg/dL — ABNORMAL HIGH (ref 65–99)

## 2015-01-14 NOTE — Discharge Summary (Signed)
Physician Discharge Summary  Patient ID: Melissa Benson MRN: [id number] DOB/AGE: November 23, 1960 55 y.o.  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  UGI on PD 1 looked good.  Diet begun and advance.  Ready for discharge on PD 2  Consults: none  Significant Diagnostic Studies: UGI ok    Discharge Exam: Blood pressure 146/61, pulse 95, temperature 99.8 F (37.7 C), temperature source Oral, resp. rate 16, height 5\' 6"  (1.676 m), weight 98.998 kg (218 lb 4 oz), SpO2 95 %. Incisions ok but right lower quadrant had some ecchymosis  Disposition: 01-Home or Self Care  Discharge Instructions    Ambulate hourly while awake    Complete by:  As directed      Call MD for:  difficulty breathing, headache or visual disturbances    Complete by:  As directed      Call MD for:  persistant dizziness or light-headedness    Complete by:  As directed      Call MD for:  persistant nausea and vomiting    Complete by:  As directed      Call MD for:  redness, tenderness, or signs of infection (pain, swelling, redness, odor or green/yellow discharge around incision site)    Complete by:  As directed      Call MD for:  severe uncontrolled pain    Complete by:  As directed      Call MD for:  temperature >101 F    Complete by:  As directed      Diet bariatric full liquid    Complete by:  As directed      Incentive spirometry    Complete by:  As directed   Perform hourly while awake            Medication List    TAKE these medications        amLODipine 5 MG tablet  Commonly known as:  NORVASC  TAKE 1 TABLET BY MOUTH EVERY DAY     calcium-vitamin D 250-100 MG-UNIT tablet  Take 1 tablet by mouth daily.     docusate sodium 100 MG capsule  Commonly known as:   COLACE  Take 100 mg by mouth daily.     FIBER SELECT GUMMIES PO  Take 1 each by mouth daily.     FINACEA 15 % cream  Generic drug:  Azelaic Acid  APPLY ONE APPLICATION TOPICALLY TO FACE EVERY MORNING     glucose blood test strip  Commonly known as:  BAYER CONTOUR NEXT TEST  Use as instructed to check blood sugar 3 times per day dx code E11.65     HYDROcodone-acetaminophen 10-325 MG tablet  Commonly known as:  NORCO  Take 1 tablet by mouth every 8 (eight) hours as needed.     Insulin Pen Needle 32G X 5 MM Misc  Commonly known as:  NOVOTWIST  Use daily to inject saxenda     LANTUS SOLOSTAR 100 UNIT/ML Solostar Pen  Generic drug:  Insulin Glargine  ADMINISTER 50 UNITS UNDER THE SKIN EVERY DAY     metFORMIN 500 MG 24 hr tablet  Commonly known as:  GLUCOPHAGE-XR  TAKE 4 TABLETS BY MOUTH EVERY DAY WITH DINNER  Notes to Patient:  Monitor Blood Sugar  Frequently and keep a log for primary care physician, you may need to adjust medication dosage with rapid weight loss.   May be too large to swallow, cannot be crushed or cut in half.     metFORMIN 500 MG tablet  Commonly known as:  GLUCOPHAGE  Take 1 tablet (500 mg total) by mouth 2 (two) times daily with a meal.  Notes to Patient:  Monitor Blood Sugar Frequently and keep a log for primary care physician, you may need to adjust medication dosage with rapid weight loss.        multivitamin tablet  Take 1 tablet by mouth daily.     promethazine 25 MG tablet  Commonly known as:  PHENERGAN  TAKE 1 TABLET BY MOUTH EVERY 4 HOURS AS NEEDED FOR PAIN     ramipril 10 MG capsule  Commonly known as:  ALTACE  TAKE ONE CAPSULE BY MOUTH EVERY DAY.  Notes to Patient:  Monitor Blood Pressure Daily and keep a log for primary care physician.  You may need to make changes to your medications with rapid weight loss.       rosuvastatin 10 MG tablet  Commonly known as:  CRESTOR  Take 1 tablet (10 mg total) by mouth daily.     VICTOZA 18 MG/3ML  Sopn  Generic drug:  Liraglutide  Inject 0.3 mLs (1.8 mg total) into the skin daily.  Notes to Patient:  Monitor Blood Sugar Frequently and keep a log for primary care physician, you may need to adjust medication dosage with rapid weight loss.       vitamin B-12 1000 MCG tablet  Commonly known as:  CYANOCOBALAMIN  Take 1,000 mcg by mouth daily.     Vitamin D (Ergocalciferol) 50000 units Caps capsule  Commonly known as:  DRISDOL  TAKE ONE CAPSULE BY MOUTH EVERY 7 DAYS           Follow-up Information    Follow up with Valarie Merino, MD. Go on 01/21/2015.   Specialty:  General Surgery   Why:  For Post-Op Check at 10:45 AM   Contact information:   [address] [place] [address] [phone]       Follow up with Valarie Merino, MD. Go on 02/25/2015.   Specialty:  General Surgery   Why:  For Post-Op Check at 1:30   Contact information:   [address] [place] [address] [phone]       Signed: Valarie Merino 01/14/2015, 11:37 AM

## 2015-01-14 NOTE — Progress Notes (Signed)
Patient self administered CPAP 

## 2015-01-14 NOTE — Progress Notes (Signed)
Patient alert and oriented with pain controlled. Patient given discharge instructions and able to verbalize instructions. All questions answered.

## 2015-01-19 ENCOUNTER — Other Ambulatory Visit: Payer: Self-pay | Admitting: Internal Medicine

## 2015-01-19 ENCOUNTER — Emergency Department (HOSPITAL_COMMUNITY)
Admission: EM | Admit: 2015-01-19 | Discharge: 2015-01-20 | Disposition: A | Discharge status: Home / Self Care | Payer: BLUE CROSS/BLUE SHIELD | Attending: Emergency Medicine | Admitting: Emergency Medicine

## 2015-01-19 ENCOUNTER — Encounter (HOSPITAL_COMMUNITY): Payer: Self-pay | Admitting: Emergency Medicine

## 2015-01-19 DIAGNOSIS — Z90710 Acquired absence of both cervix and uterus: Secondary | ICD-10-CM | POA: Insufficient documentation

## 2015-01-19 DIAGNOSIS — G8929 Other chronic pain: Secondary | ICD-10-CM | POA: Diagnosis not present

## 2015-01-19 DIAGNOSIS — Z9049 Acquired absence of other specified parts of digestive tract: Secondary | ICD-10-CM | POA: Insufficient documentation

## 2015-01-19 DIAGNOSIS — Z794 Long term (current) use of insulin: Secondary | ICD-10-CM | POA: Insufficient documentation

## 2015-01-19 DIAGNOSIS — K5900 Constipation, unspecified: Secondary | ICD-10-CM

## 2015-01-19 DIAGNOSIS — Z87440 Personal history of urinary (tract) infections: Secondary | ICD-10-CM | POA: Diagnosis not present

## 2015-01-19 DIAGNOSIS — Z9981 Dependence on supplemental oxygen: Secondary | ICD-10-CM | POA: Insufficient documentation

## 2015-01-19 DIAGNOSIS — K219 Gastro-esophageal reflux disease without esophagitis: Secondary | ICD-10-CM | POA: Insufficient documentation

## 2015-01-19 DIAGNOSIS — E119 Type 2 diabetes mellitus without complications: Secondary | ICD-10-CM | POA: Diagnosis not present

## 2015-01-19 DIAGNOSIS — Z7984 Long term (current) use of oral hypoglycemic drugs: Secondary | ICD-10-CM | POA: Diagnosis not present

## 2015-01-19 DIAGNOSIS — Z9884 Bariatric surgery status: Secondary | ICD-10-CM | POA: Insufficient documentation

## 2015-01-19 DIAGNOSIS — Z79899 Other long term (current) drug therapy: Secondary | ICD-10-CM | POA: Diagnosis not present

## 2015-01-19 DIAGNOSIS — G4733 Obstructive sleep apnea (adult) (pediatric): Secondary | ICD-10-CM | POA: Diagnosis not present

## 2015-01-19 DIAGNOSIS — E785 Hyperlipidemia, unspecified: Secondary | ICD-10-CM | POA: Insufficient documentation

## 2015-01-19 DIAGNOSIS — Z872 Personal history of diseases of the skin and subcutaneous tissue: Secondary | ICD-10-CM | POA: Insufficient documentation

## 2015-01-19 DIAGNOSIS — I10 Essential (primary) hypertension: Secondary | ICD-10-CM | POA: Diagnosis not present

## 2015-01-19 DIAGNOSIS — Z9851 Tubal ligation status: Secondary | ICD-10-CM | POA: Insufficient documentation

## 2015-01-19 DIAGNOSIS — Z87891 Personal history of nicotine dependence: Secondary | ICD-10-CM | POA: Diagnosis not present

## 2015-01-19 DIAGNOSIS — R1084 Generalized abdominal pain: Secondary | ICD-10-CM | POA: Diagnosis present

## 2015-01-19 NOTE — ED Notes (Signed)
Pt states she is constipated  Pt states she had bariatric surgery on Monday  Pt states she was doing good and had a regular bowel movement on Friday  Pt states since then she has not been going  Pt states she had a small BM today  Pt called the dr and was told to increase her miralax  Pt states she has used that 3 doses today and has attempted an enema without relief  Pt states she has rectal bleeding from the irritation  Pt states she is having abdominal discomfort

## 2015-01-19 NOTE — Telephone Encounter (Signed)
Patient states that she has been checking her BP since her surgery and are as followed 131/82; 118/76; 139/84; 154/90 (yesterday)- which is what led to her calling in the refill. She is scheduled 01/29/15

## 2015-01-19 NOTE — Telephone Encounter (Signed)
Call pt. Need to follow up on BP - may need less medication after gastric bypass surgery?

## 2015-01-20 ENCOUNTER — Other Ambulatory Visit: Payer: Self-pay

## 2015-01-20 ENCOUNTER — Telehealth (HOSPITAL_COMMUNITY): Payer: Self-pay

## 2015-01-20 MED ORDER — MILK AND MOLASSES ENEMA
1.0000 | Freq: Once | RECTAL | Status: AC
  Administered 2015-01-20: 250 mL via RECTAL
  Filled 2015-01-20: qty 250

## 2015-01-20 MED ORDER — RAMIPRIL 10 MG PO CAPS: 10.0000 mg | ORAL_CAPSULE | Freq: Every day | ORAL | Status: DC

## 2015-01-20 MED ORDER — MAGNESIUM CITRATE PO SOLN
1.0000 | Freq: Once | ORAL | Status: AC
  Administered 2015-01-20: 1 via ORAL
  Filled 2015-01-20: qty 296

## 2015-01-20 NOTE — Telephone Encounter (Signed)
Refill once 

## 2015-01-20 NOTE — ED Provider Notes (Signed)
CSN: [id number]     Arrival date & time 01/19/15  2021 History  By signing my name below, I, Melissa Benson, attest that this documentation has been prepared under the direction and in the presence of Melissa Spates, MD. Electronically Signed: Budd Benson, ED Scribe. 01/20/2015. 2:28 AM.   Chief Complaint  Patient presents with  . Constipation   The history is provided by the patient and a relative. No language interpreter was used.   HPI Comments: Melissa Benson is a 55 y.o. female former smoker with a PMHx of HTN, HLD, and DM, as well as a PSHx of Laparoscopic Roux-en-Y gastric bypass with hiatal hernia repair 1 week ago who presents to the Emergency Department complaining of constipation onset 3 days ago. She reports associated generalized abdominal pain, nausea, and rectal bleeding due to irritation. She states she finally had a BM consisting of small, hard pellets yesterday due to taking Miralax 3x. Per relative, pt has tried an enema without relief as well. Pt states she is experiencing pain with drinking fluids. She notes she has a f/u scheduled with her surgeon tomorrow. She denies drainage from the surgery site. Pt denies fever and vomiting.  Pt is allergic to morphine and related.  Past Medical History  Diagnosis Date  . Hypertension   . Rosacea   . Psoriasis   . Diabetes mellitus   . OSA (obstructive sleep apnea)     on CPAP  . History of migraine headaches   . Neck pain, chronic   . Hyperlipidemia   . Complication of anesthesia   . PONV (postoperative nausea and vomiting)   . History of vertigo   . History of urinary tract infection     last one 11/2014   . Weak urinary stream   . GERD (gastroesophageal reflux disease)   . History of hiatal hernia   . Headache     no issues since stop caffeine   Past Surgical History  Procedure Laterality Date  . Cholecystectomy    . Appendectomy    . Tubal ligation    . Abdominal hysterectomy    . Spine surgery       C6-C7 fusion x2  . Spine surgery      herniated disc L4-L5  . Cardiovascular stress test  07/20/2011    Normal myocardial perfusion study, EKG negative for ischemia, no ECG changes  . 2d echocardiogram  07/20/2011    EF >55%, normal  . Dilation and curettage of uterus    . Colonscopy       polyps removed   . Diagnostic laparoscopy    . Laparoscopic roux-en-y gastric bypass with hiatal hernia repair N/A 01/12/2015    Procedure: LAPAROSCOPIC ROUX-EN-Y GASTRIC BYPASS WITH LYSIS OF ADHESIONS ;  Surgeon: Luretha Murphy, MD;  Location: [facility];  Service: General;  Laterality: N/A;   Family History  Problem Relation Age of Onset  . Diabetes Mother   . Heart disease Mother   . Hypertension Mother   . Lung disease Father   . Heart disease Father   . Hypertension Father   . Diabetes Father   . Hypertension Sister   . Hyperlipidemia Brother   . Hypertension Brother   . Heart attack Maternal Grandmother   . Heart disease Maternal Grandmother   . Hypertension Maternal Grandfather   . Diabetes Maternal Grandfather   . Heart disease Maternal Grandfather    Social History  Substance Use Topics  . Smoking status: Former Smoker --  2.50 packs/day for 8 years    Types: Cigarettes    Quit date: 05/18/1983  . Smokeless tobacco: Never Used  . Alcohol Use: No   OB History    No data available     Review of Systems 10 Systems reviewed and all are negative for acute change except as noted in the HPI.   Allergies  Morphine and related  Home Medications   Prior to Admission medications   Medication Sig Start Date End Date Taking? Authorizing Provider  amLODipine (NORVASC) 5 MG tablet TAKE 1 TABLET BY MOUTH EVERY DAY Patient taking differently: TAKE 1/2 TABLET (2.5 MG) BY MOUTH EVERY DAY 09/02/14  Yes Reather Littler, MD  calcium-vitamin D 250-100 MG-UNIT tablet Take 1 tablet by mouth daily.   Yes Historical Provider, MD  FIBER SELECT GUMMIES PO Take 1 each by mouth daily.   Yes Historical Provider,  MD  FINACEA 15 % cream APPLY ONE APPLICATION TOPICALLY TO FACE EVERY MORNING 10/27/14  Yes Historical Provider, MD  HYDROcodone-acetaminophen (NORCO) 10-325 MG tablet Take 1 tablet by mouth every 8 (eight) hours as needed. 11/10/14  Yes Margaree Mackintosh, MD  LANTUS SOLOSTAR 100 UNIT/ML Solostar Pen ADMINISTER 50 UNITS UNDER THE SKIN EVERY DAY Patient taking differently: ADMINISTER 36 UNITS UNDER THE SKIN EVERY EVENING 09/02/14  Yes Reather Littler, MD  metFORMIN (GLUCOPHAGE) 500 MG tablet Take 1 tablet (500 mg total) by mouth 2 (two) times daily with a meal. 01/09/15  Yes Margaree Mackintosh, MD  mineral oil enema Place 1 enema rectally once.   Yes Historical Provider, MD  Multiple Vitamin (MULTIVITAMIN) tablet Take 1 tablet by mouth daily.   Yes Historical Provider, MD  ondansetron (ZOFRAN-ODT) 8 MG disintegrating tablet Take 1 tablet by mouth every 6 (six) hours as needed for nausea.  12/31/14  Yes Historical Provider, MD  pantoprazole (PROTONIX) 40 MG tablet Take 1 tablet by mouth daily. 12/31/14  Yes Historical Provider, MD  polyethylene glycol (MIRALAX / GLYCOLAX) packet Take 17 g by mouth daily.   Yes Historical Provider, MD  promethazine (PHENERGAN) 25 MG tablet TAKE 1 TABLET BY MOUTH EVERY 4 HOURS AS NEEDED FOR PAIN Patient taking differently: TAKE 1 TABLET BY MOUTH EVERY 4 HOURS AS NEEDED FOR NAUSEA 11/28/14  Yes Margaree Mackintosh, MD  rosuvastatin (CRESTOR) 10 MG tablet Take 1 tablet (10 mg total) by mouth daily. 10/28/14  Yes Margaree Mackintosh, MD  VICTOZA 18 MG/3ML SOPN Inject 0.3 mLs (1.8 mg total) into the skin daily. 12/01/14  Yes Reather Littler, MD  vitamin B-12 (CYANOCOBALAMIN) 1000 MCG tablet Take 1,000 mcg by mouth daily.   Yes Historical Provider, MD  Vitamin D, Ergocalciferol, (DRISDOL) 50000 UNITS CAPS capsule TAKE ONE CAPSULE BY MOUTH EVERY 7 DAYS Patient taking differently: TAKE ONE CAPSULE BY MOUTH EVERY WEDNESDAY 01/09/14  Yes Reather Littler, MD  glucose blood (BAYER CONTOUR NEXT TEST) test strip Use as  instructed to check blood sugar 3 times per day dx code E11.65 08/08/14   Reather Littler, MD  Insulin Pen Needle (NOVOTWIST) 32G X 5 MM MISC Use daily to inject saxenda 04/08/14   Reather Littler, MD  metFORMIN (GLUCOPHAGE-XR) 500 MG 24 hr tablet TAKE 4 TABLETS BY MOUTH EVERY DAY WITH DINNER Patient not taking: Reported on 01/20/2015 12/30/14   Reather Littler, MD  ramipril (ALTACE) 10 MG capsule Take 1 capsule (10 mg total) by mouth daily. 01/20/15   Margaree Mackintosh, MD   BP 152/84 mmHg  Pulse 111  Temp(Src)  99 F (37.2 C) (Oral)  Resp 20  SpO2 96% Physical Exam  Constitutional: She is oriented to person, place, and time. She appears well-developed and well-nourished. No distress.  HENT:  Head: Normocephalic and atraumatic.  Moist mucous membranes  Eyes: Conjunctivae are normal. Pupils are equal, round, and reactive to light.  Neck: Neck supple.  Cardiovascular: Normal rate, regular rhythm and normal heart sounds.   No murmur heard. Pulmonary/Chest: Effort normal and breath sounds normal.  Abdominal: Soft. Bowel sounds are normal. She exhibits no distension. There is tenderness.  Mild tenderness around surgical incision sites, but no peritonitis  Genitourinary:  Hard stool high in rectum, no fecal impaction  Musculoskeletal: She exhibits no edema.  Neurological: She is alert and oriented to person, place, and time.  Fluent speech  Skin: Skin is warm and dry.  Multiple abdominal incision sites, clean, dry and intact with no drainage  Psychiatric: She has a normal mood and affect. Judgment normal.  Nursing note and vitals reviewed. Chaperone was present during exam.   ED Course  Procedures  DIAGNOSTIC STUDIES: Oxygen Saturation is 95% on RA, adequate by my interpretation.    COORDINATION OF CARE: 2:25 AM - Discussed plans to try an enema. Pt advised of plan for treatment and pt agrees.  Labs Review Labs Reviewed - No data to display  Imaging Review No results found. I have personally  reviewed and evaluated these images and lab results as part of my medical decision-making.   EKG Interpretation None     Medications  milk and molasses enema (250 mLs Rectal Given 01/20/15 0256)  magnesium citrate solution 1 Bottle (1 Bottle Oral Given 01/20/15 0424)     MDM   Final diagnoses:  Constipation, unspecified constipation type   Pt 1 week post-op from gastric bypass p/w several days of constipation despite miralax. Pt well appearing, afebrile at presentation. Mild TTP around incision sites but no peritonitis, no drainage from sites. Rectal exam w/ no blood, no impaction. Gave milk and molasses enema after which patient had several BMs in ED. Gave a few sips of mag citrate as well. On reexamination, pt stated she felt improved. Given no distention or fever and no concerns at incision sites, I feel she is safe for d/c w/ surgery f/u tomorrow. Reviewed instructions for constipation treatment including colace, miralax, senna prn. Pt voiced understanding and was discharged in satisfactory condition.  I personally performed the services described in this documentation, which was scribed in my presence. The recorded information has been reviewed and is accurate.   Melissa Spates, MD 01/20/15 2036

## 2015-01-20 NOTE — Discharge Instructions (Signed)
Take colace, 1-2 capsules, daily Take miralax, 1 capful, 1 to 3 times daily as needed to have soft bowel movements Take senna, 1 tablet at night, if severe constipation  Constipation, Adult Constipation is when a person has fewer than three bowel movements a week, has difficulty having a bowel movement, or has stools that are dry, hard, or larger than normal. As people grow older, constipation is more common. A low-fiber diet, not taking in enough fluids, and taking certain medicines may make constipation worse.  CAUSES   Certain medicines, such as antidepressants, pain medicine, iron supplements, antacids, and water pills.   Certain diseases, such as diabetes, irritable bowel syndrome (IBS), thyroid disease, or depression.   Not drinking enough water.   Not eating enough fiber-rich foods.   Stress or travel.   Lack of physical activity or exercise.   Ignoring the urge to have a bowel movement.   Using laxatives too much.  SIGNS AND SYMPTOMS   Having fewer than three bowel movements a week.   Straining to have a bowel movement.   Having stools that are hard, dry, or larger than normal.   Feeling full or bloated.   Pain in the lower abdomen.   Not feeling relief after having a bowel movement.  DIAGNOSIS  Your health care provider will take a medical history and perform a physical exam. Further testing may be done for severe constipation. Some tests may include:  A barium enema X-ray to examine your rectum, colon, and, sometimes, your small intestine.   A sigmoidoscopy to examine your lower colon.   A colonoscopy to examine your entire colon. TREATMENT  Treatment will depend on the severity of your constipation and what is causing it. Some dietary treatments include drinking more fluids and eating more fiber-rich foods. Lifestyle treatments may include regular exercise. If these diet and lifestyle recommendations do not help, your health care provider may  recommend taking over-the-counter laxative medicines to help you have bowel movements. Prescription medicines may be prescribed if over-the-counter medicines do not work.  HOME CARE INSTRUCTIONS   Eat foods that have a lot of fiber, such as fruits, vegetables, whole grains, and beans.  Limit foods high in fat and processed sugars, such as french fries, hamburgers, cookies, candies, and soda.   A fiber supplement may be added to your diet if you cannot get enough fiber from foods.   Drink enough fluids to keep your urine clear or pale yellow.   Exercise regularly or as directed by your health care provider.   Go to the restroom when you have the urge to go. Do not hold it.   Only take over-the-counter or prescription medicines as directed by your health care provider. Do not take other medicines for constipation without talking to your health care provider first.  SEEK IMMEDIATE MEDICAL CARE IF:   You have bright red blood in your stool.   Your constipation lasts for more than 4 days or gets worse.   You have abdominal or rectal pain.   You have thin, pencil-like stools.   You have unexplained weight loss. MAKE SURE YOU:   Understand these instructions.  Will watch your condition.  Will get help right away if you are not doing well or get worse.   This information is not intended to replace advice given to you by your health care provider. Make sure you discuss any questions you have with your health care provider.   Document Released: 09/18/2003 Document Revised: 01/10/2014  Document Reviewed: 10/01/2012 Elsevier Interactive Patient Education [employer].

## 2015-01-20 NOTE — Telephone Encounter (Signed)
Made discharge phone call to patient per DROP protocol. Asking the following questions.    1. Do you have someone to care for you now that you are home?  yes 2. Are you having pain now that is not relieved by your pain medication?  no 3. Are you able to drink the recommended daily amount of fluids (48 ounces minimum/day) and protein (60-80 grams/day) as prescribed by the dietitian or nutritional counselor?  yes 4. Are you taking the vitamins and minerals as prescribed?  yes 5. Do you have the "on call" number to contact your surgeon if you have a problem or question?  yes 6. Are your incisions free of redness, swelling or drainage? (If steri strips, address that these can fall off, shower as tolerated) yes 7. Have your bowels moved since your surgery?  If not, are you passing gas?  Yes (was in ED last night with constipation, and received enema) 8. Are you up and walking 3-4 times per day?  yes

## 2015-01-20 NOTE — Telephone Encounter (Signed)
Please see message below

## 2015-01-21 ENCOUNTER — Emergency Department (HOSPITAL_COMMUNITY)
Admission: EM | Admit: 2015-01-21 | Discharge: 2015-01-21 | Disposition: A | Discharge status: Home / Self Care | Payer: BLUE CROSS/BLUE SHIELD | Attending: Emergency Medicine | Admitting: Emergency Medicine

## 2015-01-21 ENCOUNTER — Other Ambulatory Visit: Payer: Self-pay | Admitting: Surgery

## 2015-01-21 ENCOUNTER — Encounter (HOSPITAL_COMMUNITY): Payer: Self-pay | Admitting: *Deleted

## 2015-01-21 ENCOUNTER — Emergency Department (HOSPITAL_COMMUNITY): Payer: BLUE CROSS/BLUE SHIELD

## 2015-01-21 DIAGNOSIS — K219 Gastro-esophageal reflux disease without esophagitis: Secondary | ICD-10-CM | POA: Insufficient documentation

## 2015-01-21 DIAGNOSIS — Z87891 Personal history of nicotine dependence: Secondary | ICD-10-CM | POA: Diagnosis not present

## 2015-01-21 DIAGNOSIS — G4733 Obstructive sleep apnea (adult) (pediatric): Secondary | ICD-10-CM | POA: Insufficient documentation

## 2015-01-21 DIAGNOSIS — Z7984 Long term (current) use of oral hypoglycemic drugs: Secondary | ICD-10-CM | POA: Insufficient documentation

## 2015-01-21 DIAGNOSIS — R112 Nausea with vomiting, unspecified: Secondary | ICD-10-CM | POA: Insufficient documentation

## 2015-01-21 DIAGNOSIS — H53149 Visual discomfort, unspecified: Secondary | ICD-10-CM | POA: Diagnosis not present

## 2015-01-21 DIAGNOSIS — R519 Headache, unspecified: Secondary | ICD-10-CM

## 2015-01-21 DIAGNOSIS — Z79899 Other long term (current) drug therapy: Secondary | ICD-10-CM | POA: Diagnosis not present

## 2015-01-21 DIAGNOSIS — Z87440 Personal history of urinary (tract) infections: Secondary | ICD-10-CM | POA: Diagnosis not present

## 2015-01-21 DIAGNOSIS — E119 Type 2 diabetes mellitus without complications: Secondary | ICD-10-CM | POA: Insufficient documentation

## 2015-01-21 DIAGNOSIS — G8929 Other chronic pain: Secondary | ICD-10-CM | POA: Diagnosis not present

## 2015-01-21 DIAGNOSIS — R51 Headache: Secondary | ICD-10-CM | POA: Diagnosis present

## 2015-01-21 DIAGNOSIS — Z794 Long term (current) use of insulin: Secondary | ICD-10-CM | POA: Diagnosis not present

## 2015-01-21 DIAGNOSIS — Z872 Personal history of diseases of the skin and subcutaneous tissue: Secondary | ICD-10-CM | POA: Insufficient documentation

## 2015-01-21 DIAGNOSIS — E785 Hyperlipidemia, unspecified: Secondary | ICD-10-CM | POA: Diagnosis not present

## 2015-01-21 DIAGNOSIS — Z9981 Dependence on supplemental oxygen: Secondary | ICD-10-CM | POA: Diagnosis not present

## 2015-01-21 DIAGNOSIS — I10 Essential (primary) hypertension: Secondary | ICD-10-CM | POA: Diagnosis not present

## 2015-01-21 LAB — CBC WITH DIFFERENTIAL/PLATELET
BASOS PCT: 0 %
Basophils Absolute: 0 10*3/uL (ref 0.0–0.1)
EOS ABS: 0 10*3/uL (ref 0.0–0.7)
Eosinophils Relative: 0 %
HCT: 36.6 % (ref 36.0–46.0)
HEMOGLOBIN: 12.1 g/dL (ref 12.0–15.0)
Lymphocytes Relative: 12 %
Lymphs Abs: 1 10*3/uL (ref 0.7–4.0)
MCH: 29.5 pg (ref 26.0–34.0)
MCHC: 33.1 g/dL (ref 30.0–36.0)
MCV: 89.3 fL (ref 78.0–100.0)
MONOS PCT: 6 %
Monocytes Absolute: 0.5 10*3/uL (ref 0.1–1.0)
NEUTROS PCT: 82 %
Neutro Abs: 6.9 10*3/uL (ref 1.7–7.7)
Platelets: 312 10*3/uL (ref 150–400)
RBC: 4.1 MIL/uL (ref 3.87–5.11)
RDW: 14 % (ref 11.5–15.5)
WBC: 8.4 10*3/uL (ref 4.0–10.5)

## 2015-01-21 LAB — I-STAT CHEM 8, ED
BUN: 19 mg/dL (ref 6–20)
Calcium, Ion: 1.16 mmol/L (ref 1.12–1.23)
Chloride: 99 mmol/L — ABNORMAL LOW (ref 101–111)
Creatinine, Ser: 0.7 mg/dL (ref 0.44–1.00)
Glucose, Bld: 178 mg/dL — ABNORMAL HIGH (ref 65–99)
HEMATOCRIT: 39 % (ref 36.0–46.0)
HEMOGLOBIN: 13.3 g/dL (ref 12.0–15.0)
Potassium: 3.8 mmol/L (ref 3.5–5.1)
SODIUM: 141 mmol/L (ref 135–145)
TCO2: 28 mmol/L (ref 0–100)

## 2015-01-21 MED ORDER — SODIUM CHLORIDE 0.9 % IV BOLUS (SEPSIS)
1000.0000 mL | Freq: Once | INTRAVENOUS | Status: AC
  Administered 2015-01-21: 1000 mL via INTRAVENOUS

## 2015-01-21 MED ORDER — HYDROCODONE-ACETAMINOPHEN 5-325 MG PO TABS: 1.0000 | ORAL_TABLET | Freq: Four times a day (QID) | ORAL | Status: DC | PRN

## 2015-01-21 MED ORDER — HYDROMORPHONE HCL 1 MG/ML IJ SOLN
0.5000 mg | Freq: Once | INTRAMUSCULAR | Status: AC
  Administered 2015-01-21: 0.5 mg via INTRAVENOUS
  Filled 2015-01-21: qty 1

## 2015-01-21 MED ORDER — HYDROMORPHONE HCL 1 MG/ML IJ SOLN
1.0000 mg | Freq: Once | INTRAMUSCULAR | Status: AC
  Administered 2015-01-21: 1 mg via INTRAVENOUS
  Filled 2015-01-21: qty 1

## 2015-01-21 MED ORDER — ONDANSETRON 4 MG PO TBDP
ORAL_TABLET | ORAL | Status: AC
  Filled 2015-01-21: qty 1

## 2015-01-21 MED ORDER — MAGNESIUM SULFATE 2 GM/50ML IV SOLN
2.0000 g | Freq: Once | INTRAVENOUS | Status: AC
  Administered 2015-01-21: 2 g via INTRAVENOUS
  Filled 2015-01-21: qty 50

## 2015-01-21 MED ORDER — HYDROCODONE-ACETAMINOPHEN 7.5-325 MG/15ML PO SOLN: 15.0000 mL | Freq: Four times a day (QID) | ORAL | Status: DC | PRN

## 2015-01-21 MED ORDER — PROCHLORPERAZINE EDISYLATE 5 MG/ML IJ SOLN
10.0000 mg | Freq: Four times a day (QID) | INTRAMUSCULAR | Status: DC | PRN
  Administered 2015-01-21: 10 mg via INTRAVENOUS
  Filled 2015-01-21: qty 2

## 2015-01-21 MED ORDER — ONDANSETRON 4 MG PO TBDP
4.0000 mg | ORAL_TABLET | Freq: Once | ORAL | Status: AC | PRN
  Administered 2015-01-21: 4 mg via ORAL

## 2015-01-21 NOTE — ED Notes (Signed)
Pt reports having severe headache since Monday, getting progressively worse. Having nausea and sensitivity to light. Denies vomiting, denies fever. Pt had bariatric surgery on 1/9 and was seen at [facility] on 1/16 for constipation. Sent here by pcp to r/o bleed. No neuro deficits noted at triage. N/v noted.

## 2015-01-21 NOTE — ED Provider Notes (Signed)
Planes of gradual onset headache, frontal, nonradiating onset 2 days ago. Associated symptoms include nausea and vomiting 2 times today. No fever. Nothing makes pain better or worse. Treated with Tylenol, without relief. No other associated symptoms. Denies abdominal pain. Patient is status post gastric bypass surgery 9 days ago On exam alert Glasgow Coma Score 15 HEENT exam no facial asymmetry neck is supple abdomen obese, normoactive bowel sounds, clean appearing surgical wounds, normal active bowel sounds, nontender. Neurologic Glasgow Coma Score 15 gait normal Romberg normal pronator drift normal finger to nose normal  Doug Sou, MD 01/21/15 1601

## 2015-01-21 NOTE — Telephone Encounter (Signed)
Escribed

## 2015-01-21 NOTE — Discharge Instructions (Signed)
Take norco as prescribed as needed. Make sure to get plenty of rest. Drink plenty of fluids. Follow up with primary care doctor. Return if worsening.   General Headache Without Cause A headache is pain or discomfort felt around the head or neck area. The specific cause of a headache may not be found. There are many causes and types of headaches. A few common ones are:  Tension headaches.  Migraine headaches.  Cluster headaches.  Chronic daily headaches. HOME CARE INSTRUCTIONS  Watch your condition for any changes. Take these steps to help with your condition: Managing Pain  Take over-the-counter and prescription medicines only as told by your health care provider.  Lie down in a dark, quiet room when you have a headache.  If directed, apply ice to the head and neck area:  Put ice in a plastic bag.  Place a towel between your skin and the bag.  Leave the ice on for 20 minutes, 2-3 times per day.  Use a heating pad or hot shower to apply heat to the head and neck area as told by your health care provider.  Keep lights dim if bright lights bother you or make your headaches worse. Eating and Drinking  Eat meals on a regular schedule.  Limit alcohol use.  Decrease the amount of caffeine you drink, or stop drinking caffeine. General Instructions  Keep all follow-up visits as told by your health care provider. This is important.  Keep a headache journal to help find out what may trigger your headaches. For example, write down:  What you eat and drink.  How much sleep you get.  Any change to your diet or medicines.  Try massage or other relaxation techniques.  Limit stress.  Sit up straight, and do not tense your muscles.  Do not use tobacco products, including cigarettes, chewing tobacco, or e-cigarettes. If you need help quitting, ask your health care provider.  Exercise regularly as told by your health care provider.  Sleep on a regular schedule. Get 7-9 hours  of sleep, or the amount recommended by your health care provider. SEEK MEDICAL CARE IF:   Your symptoms are not helped by medicine.  You have a headache that is different from the usual headache.  You have nausea or you vomit.  You have a fever. SEEK IMMEDIATE MEDICAL CARE IF:   Your headache becomes severe.  You have repeated vomiting.  You have a stiff neck.  You have a loss of vision.  You have problems with speech.  You have pain in the eye or ear.  You have muscular weakness or loss of muscle control.  You lose your balance or have trouble walking.  You feel faint or pass out.  You have confusion.   This information is not intended to replace advice given to you by your health care provider. Make sure you discuss any questions you have with your health care provider.   Document Released: 12/20/2004 Document Revised: 09/10/2014 Document Reviewed: 04/14/2014 Elsevier Interactive Patient Education [employer]

## 2015-01-21 NOTE — ED Provider Notes (Signed)
CSN: [id number]     Arrival date & time 01/21/15  1147 History   First MD Initiated Contact with Patient 01/21/15 1504     Chief Complaint  Patient presents with  . Headache     (Consider location/radiation/quality/duration/timing/severity/associated sxs/prior Treatment) HPI  Melissa Benson is a 55 y.o. female with history of hypertension, diabetes, history of migraine headaches, chronic neck pain, GERD, presents to emergency department complaining of a headache. Patient states headache started after her gastric bypass surgery which was 9 days ago. She states the headache initially was mild. States has gradually worsened since then. She reports headache significantly worsened yesterday morning when she woke up from sleep. She states pain is frontal. Does not radiate. She reports photosensitivity. She states "this is the worst headache of her life"but states onset of the headache has been gradual. She was seen by her surgeon today and sent here for "rule out bleed." Patient denies any associated neurological complaints. Specifically she denies any visual changes, denies any numbness or weakness in extremities, no dizziness, no difficulty walking. She denies any neck pain or stiffness. She denies any fever. She denies any head injuries as far she can remember. She reports some issues with constipation for which she was actually evaluated in the emergency department 2 days ago but states this is improving. She states that she has been trying hard to make sure she drinks plenty of fluids since her surgery, however states she did not drink enough yesterday. She reports two episodes of emesis Today. Patient took Tylenol for her headache and she took some of her friends migraine medication which did not help.    Past Medical History  Diagnosis Date  . Hypertension   . Rosacea   . Psoriasis   . Diabetes mellitus   . OSA (obstructive sleep apnea)     on CPAP  . History of migraine headaches   . Neck  pain, chronic   . Hyperlipidemia   . Complication of anesthesia   . PONV (postoperative nausea and vomiting)   . History of vertigo   . History of urinary tract infection     last one 11/2014   . Weak urinary stream   . GERD (gastroesophageal reflux disease)   . History of hiatal hernia   . Headache     no issues since stop caffeine   Past Surgical History  Procedure Laterality Date  . Cholecystectomy    . Appendectomy    . Tubal ligation    . Abdominal hysterectomy    . Spine surgery      C6-C7 fusion x2  . Spine surgery      herniated disc L4-L5  . Cardiovascular stress test  07/20/2011    Normal myocardial perfusion study, EKG negative for ischemia, no ECG changes  . 2d echocardiogram  07/20/2011    EF >55%, normal  . Dilation and curettage of uterus    . Colonscopy       polyps removed   . Diagnostic laparoscopy    . Laparoscopic roux-en-y gastric bypass with hiatal hernia repair N/A 01/12/2015    Procedure: LAPAROSCOPIC ROUX-EN-Y GASTRIC BYPASS WITH LYSIS OF ADHESIONS ;  Surgeon: Luretha Murphy, MD;  Location: [facility];  Service: General;  Laterality: N/A;   Family History  Problem Relation Age of Onset  . Diabetes Mother   . Heart disease Mother   . Hypertension Mother   . Lung disease Father   . Heart disease Father   . Hypertension  Father   . Diabetes Father   . Hypertension Sister   . Hyperlipidemia Brother   . Hypertension Brother   . Heart attack Maternal Grandmother   . Heart disease Maternal Grandmother   . Hypertension Maternal Grandfather   . Diabetes Maternal Grandfather   . Heart disease Maternal Grandfather    Social History  Substance Use Topics  . Smoking status: Former Smoker -- 2.50 packs/day for 8 years    Types: Cigarettes    Quit date: 05/18/1983  . Smokeless tobacco: Never Used  . Alcohol Use: No   OB History    No data available     Review of Systems  Constitutional: Negative for fever and chills.  HENT: Negative for congestion.    Eyes: Positive for photophobia.  Respiratory: Negative for cough, chest tightness and shortness of breath.   Cardiovascular: Negative for chest pain, palpitations and leg swelling.  Gastrointestinal: Positive for nausea and vomiting. Negative for abdominal pain and diarrhea.  Genitourinary: Negative for dysuria and flank pain.  Musculoskeletal: Negative for myalgias, arthralgias, neck pain and neck stiffness.  Skin: Negative for rash.  Neurological: Positive for headaches. Negative for dizziness, facial asymmetry, speech difficulty, weakness and numbness.  All other systems reviewed and are negative.     Allergies  Morphine and related  Home Medications   Prior to Admission medications   Medication Sig Start Date End Date Taking? Authorizing Provider  amLODipine (NORVASC) 5 MG tablet TAKE 1 TABLET BY MOUTH EVERY DAY Patient taking differently: TAKE 1/2 TABLET (2.5 MG) BY MOUTH EVERY DAY 09/02/14   Reather Littler, MD  calcium-vitamin D 250-100 MG-UNIT tablet Take 1 tablet by mouth daily.    Historical Provider, MD  FIBER SELECT GUMMIES PO Take 1 each by mouth daily.    Historical Provider, MD  FINACEA 15 % cream APPLY ONE APPLICATION TOPICALLY TO FACE EVERY MORNING 10/27/14   Historical Provider, MD  glucose blood (BAYER CONTOUR NEXT TEST) test strip Use as instructed to check blood sugar 3 times per day dx code E11.65 08/08/14   Reather Littler, MD  HYDROcodone-acetaminophen (NORCO) 10-325 MG tablet Take 1 tablet by mouth every 8 (eight) hours as needed. 11/10/14   Margaree Mackintosh, MD  Insulin Pen Needle (NOVOTWIST) 32G X 5 MM MISC Use daily to inject saxenda 04/08/14   Reather Littler, MD  LANTUS SOLOSTAR 100 UNIT/ML Solostar Pen ADMINISTER 50 UNITS UNDER THE SKIN EVERY DAY Patient taking differently: ADMINISTER 36 UNITS UNDER THE SKIN EVERY EVENING 09/02/14   Reather Littler, MD  metFORMIN (GLUCOPHAGE) 500 MG tablet Take 1 tablet (500 mg total) by mouth 2 (two) times daily with a meal. 01/09/15   Margaree Mackintosh,  MD  metFORMIN (GLUCOPHAGE-XR) 500 MG 24 hr tablet TAKE 4 TABLETS BY MOUTH EVERY DAY WITH DINNER Patient not taking: Reported on 01/20/2015 12/30/14   Reather Littler, MD  mineral oil enema Place 1 enema rectally once.    Historical Provider, MD  Multiple Vitamin (MULTIVITAMIN) tablet Take 1 tablet by mouth daily.    Historical Provider, MD  ondansetron (ZOFRAN-ODT) 8 MG disintegrating tablet Take 1 tablet by mouth every 6 (six) hours as needed for nausea.  12/31/14   Historical Provider, MD  pantoprazole (PROTONIX) 40 MG tablet Take 1 tablet by mouth daily. 12/31/14   Historical Provider, MD  polyethylene glycol (MIRALAX / GLYCOLAX) packet Take 17 g by mouth daily.    Historical Provider, MD  promethazine (PHENERGAN) 25 MG tablet TAKE 1 TABLET BY MOUTH  EVERY 4 HOURS AS NEEDED FOR PAIN Patient taking differently: TAKE 1 TABLET BY MOUTH EVERY 4 HOURS AS NEEDED FOR NAUSEA 11/28/14   Margaree Mackintosh, MD  ramipril (ALTACE) 10 MG capsule TAKE ONE CAPSULE BY MOUTH EVERY DAY. 01/21/15   Margaree Mackintosh, MD  ramipril (ALTACE) 10 MG capsule Take 1 capsule (10 mg total) by mouth daily. 01/20/15   Margaree Mackintosh, MD  rosuvastatin (CRESTOR) 10 MG tablet Take 1 tablet (10 mg total) by mouth daily. 10/28/14   Margaree Mackintosh, MD  VICTOZA 18 MG/3ML SOPN Inject 0.3 mLs (1.8 mg total) into the skin daily. 12/01/14   Reather Littler, MD  vitamin B-12 (CYANOCOBALAMIN) 1000 MCG tablet Take 1,000 mcg by mouth daily.    Historical Provider, MD  Vitamin D, Ergocalciferol, (DRISDOL) 50000 UNITS CAPS capsule TAKE ONE CAPSULE BY MOUTH EVERY 7 DAYS Patient taking differently: TAKE ONE CAPSULE BY MOUTH EVERY [facility] 01/09/14   Reather Littler, MD   BP 159/81 mmHg  Pulse 83  Temp(Src) 97.5 F (36.4 C) (Oral)  Resp 18  SpO2 99% Physical Exam  Constitutional: She is oriented to person, place, and time. She appears well-developed and well-nourished. No distress.  HENT:  Head: Normocephalic.  TMs bilaterally  Eyes: Conjunctivae and EOM are  normal. Pupils are equal, round, and reactive to light.  Neck: Normal range of motion. Neck supple.  No meningismus  Cardiovascular: Normal rate, regular rhythm and normal heart sounds.   Pulmonary/Chest: Effort normal and breath sounds normal. No respiratory distress. She has no wheezes. She has no rales.  Abdominal: Soft. Bowel sounds are normal. She exhibits no distension. There is no tenderness. There is no rebound.  Incisions to the abdomen healing well, dermabond intact  Musculoskeletal: She exhibits no edema.  Neurological: She is alert and oriented to person, place, and time. No cranial nerve deficit. Coordination normal.  5/5 and equal upper and lower extremity strength bilaterally. Equal grip strength bilaterally. Normal finger to nose and heel to shin. No pronator drift. Patellar reflexes 2+   Skin: Skin is warm and dry.  Psychiatric: She has a normal mood and affect. Her behavior is normal.  Nursing note and vitals reviewed.   ED Course  Procedures (including critical care time) Labs Review Labs Reviewed  I-STAT CHEM 8, ED - Abnormal; Notable for the following:    Chloride 99 (*)    Glucose, Bld 178 (*)    All other components within normal limits  CBC WITH DIFFERENTIAL/PLATELET    Imaging Review Ct Head Wo Contrast  01/21/2015  CLINICAL DATA:  Severe headache and dizziness since yesterday. EXAM: CT HEAD WITHOUT CONTRAST TECHNIQUE: Contiguous axial images were obtained from the base of the skull through the vertex without intravenous contrast. COMPARISON:  None. FINDINGS: No mass lesion. No midline shift. No acute hemorrhage or hematoma. No extra-axial fluid collections. No evidence of acute infarction. The brain parenchyma is normal. Osseous structures are normal. IMPRESSION: Normal exam. Electronically Signed   By: Francene Boyers M.D.   On: 01/21/2015 13:08   I have personally reviewed and evaluated these images and lab results as part of my medical decision-making.    EKG Interpretation None      MDM   Final diagnoses:  Nonintractable headache, unspecified chronicity pattern, unspecified headache type   Patient with gradual onset of headache, which she describes as the worst headache she has ever had. This patient does have history of migraine headaches and caffeine headaches. She has not  had any caffeine recently. She did have gastric bypass surgery 9 days ago. She states headache started the day after her surgery. At this time, her neurological exam is normal. She is afebrile. No concern for meningitis. Doubt intracranial hemorrhage at this point, given several days of headache with normal neurological exam. Patient appears to be dehydrated. We'll start IV fluids. Will try Compazine for her headache. Will reassess. Also ordered CBC and Chem-8.  4:15 PM Pt's nausea improved. Pt continues to have headache. Will try opiods for pain  5:25 PM Continues to have headache. Will try magnesium and more dilaudid  7:11 PM Patient received dose of magnesium and Dilaudid. She feels much better. Vomiting improved. Patient is comfortable going home. Again at this time I do not think patient has any signs of intracranial hemorrhage. No meningismus. Vital signs are normal. We'll discharge home with close outpatient follow-up. Neurovascularly intact at time of discharge.   Filed Vitals:   01/21/15 1645 01/21/15 1715 01/21/15 1730 01/21/15 1900  BP: 149/67 135/63 141/61 127/67  Pulse: 86 81 84 79  Temp:      TempSrc:      Resp: SpO2: 98% 93% 94% 98%     Jaynie Crumble, PA-C 01/21/15 1912  Doug Sou, MD 01/21/15 2339

## 2015-01-21 NOTE — ED Notes (Addendum)
Dr. Daphine Deutscher called to advise that patient was coming in with "worst headache of her life".  He advised patient was coming in by private vehicle.   I asked if stroke symptoms could patient come by ambulance.  He advised that he was not concerned for stroke, but needed to be worked up for possible bleed.   I advised I would note patient chart.  Patient stated on arrival has had the headache since Monday and has progressively gotten worse in the last two days.

## 2015-01-27 ENCOUNTER — Encounter: Payer: BLUE CROSS/BLUE SHIELD | Attending: Internal Medicine

## 2015-01-27 DIAGNOSIS — Z01818 Encounter for other preprocedural examination: Secondary | ICD-10-CM | POA: Diagnosis present

## 2015-01-28 NOTE — Progress Notes (Signed)
Bariatric Class:  Appt start time: 1530 end time:  1630.  2 Week Post-Operative Nutrition Class  Patient was seen on 01/27/2015 for Post-Operative Nutrition education at [facility].   Surgery date: 01/12/2015 Surgery type: RYGB Start weight at [facility]: 234 lbs on 12/11/2014 Weight today: 208.5 lbs Weight change: 24.5 lbs  TANITA  BODY COMP RESULTS  12/30/14 01/27/15   BMI (kg/m^2) 37.6 33.7   Fat Mass (lbs) 109 98.5   Fat Free Mass (lbs) 124 110   Total Body Water (lbs) 91 80.5    The following the learning objectives were met by the patient during this course:  Identifies Phase 3A (Soft, High Proteins) Dietary Goals and will begin from 2 weeks post-operatively to 2 months post-operatively  Identifies appropriate sources of fluids and proteins   States protein recommendations and appropriate sources post-operatively  Identifies the need for appropriate texture modifications, mastication, and bite sizes when consuming solids  Identifies appropriate multivitamin and calcium sources post-operatively  Describes the need for physical activity post-operatively and will follow MD recommendations  States when to call healthcare provider regarding medication questions or post-operative complications  Handouts given during class include:  Phase 3A: Soft, High Protein Diet Handout  Follow-Up Plan: Patient will follow-up at [facility] in 4 weeks for 6 week post-op nutrition visit for diet advancement per MD.

## 2015-01-29 ENCOUNTER — Encounter: Payer: Self-pay | Admitting: Adult Health

## 2015-01-29 ENCOUNTER — Encounter: Payer: Self-pay | Admitting: Internal Medicine

## 2015-01-29 ENCOUNTER — Ambulatory Visit (INDEPENDENT_AMBULATORY_CARE_PROVIDER_SITE_OTHER): Payer: BLUE CROSS/BLUE SHIELD | Admitting: Internal Medicine

## 2015-01-29 VITALS — BP 148/80 | HR 106 | Temp 99.0°F | Resp 20 | Ht 66.0 in | Wt 207.5 lb

## 2015-01-29 DIAGNOSIS — F411 Generalized anxiety disorder: Secondary | ICD-10-CM | POA: Diagnosis not present

## 2015-01-29 DIAGNOSIS — G8929 Other chronic pain: Secondary | ICD-10-CM

## 2015-01-29 DIAGNOSIS — Z130 Encounter for screening for diseases of the blood and blood-forming organs and certain disorders involving the immune mechanism: Secondary | ICD-10-CM

## 2015-01-29 DIAGNOSIS — G4700 Insomnia, unspecified: Secondary | ICD-10-CM

## 2015-01-29 DIAGNOSIS — E785 Hyperlipidemia, unspecified: Secondary | ICD-10-CM | POA: Diagnosis not present

## 2015-01-29 DIAGNOSIS — E119 Type 2 diabetes mellitus without complications: Secondary | ICD-10-CM | POA: Diagnosis not present

## 2015-01-29 DIAGNOSIS — I10 Essential (primary) hypertension: Secondary | ICD-10-CM | POA: Diagnosis not present

## 2015-01-29 DIAGNOSIS — R5383 Other fatigue: Secondary | ICD-10-CM

## 2015-01-29 DIAGNOSIS — R519 Headache, unspecified: Secondary | ICD-10-CM

## 2015-01-29 DIAGNOSIS — Z1329 Encounter for screening for other suspected endocrine disorder: Secondary | ICD-10-CM

## 2015-01-29 DIAGNOSIS — R9431 Abnormal electrocardiogram [ECG] [EKG]: Secondary | ICD-10-CM

## 2015-01-29 DIAGNOSIS — M542 Cervicalgia: Secondary | ICD-10-CM

## 2015-01-29 DIAGNOSIS — Z9884 Bariatric surgery status: Secondary | ICD-10-CM

## 2015-01-29 DIAGNOSIS — R0602 Shortness of breath: Secondary | ICD-10-CM

## 2015-01-29 DIAGNOSIS — R51 Headache: Secondary | ICD-10-CM

## 2015-01-29 LAB — COMPLETE METABOLIC PANEL WITH GFR
ALBUMIN: 4.2 g/dL (ref 3.6–5.1)
ALK PHOS: 47 U/L (ref 33–130)
ALT: 24 U/L (ref 6–29)
AST: 28 U/L (ref 10–35)
BILIRUBIN TOTAL: 0.4 mg/dL (ref 0.2–1.2)
BUN: 19 mg/dL (ref 7–25)
CO2: 24 mmol/L (ref 20–31)
Calcium: 9.5 mg/dL (ref 8.6–10.4)
Chloride: 102 mmol/L (ref 98–110)
Creat: 0.8 mg/dL (ref 0.50–1.05)
GFR, EST NON AFRICAN AMERICAN: 83 mL/min (ref >=60)
GLUCOSE: 119 mg/dL — AB (ref 65–99)
POTASSIUM: 4 mmol/L (ref 3.5–5.3)
SODIUM: 139 mmol/L (ref 135–146)
Total Protein: 7.2 g/dL (ref 6.1–8.1)

## 2015-01-29 LAB — CBC WITH DIFFERENTIAL/PLATELET
BASOS PCT: 0 % (ref 0–1)
Basophils Absolute: 0 10*3/uL (ref 0.0–0.1)
EOS ABS: 0.1 10*3/uL (ref 0.0–0.7)
Eosinophils Relative: 1 % (ref 0–5)
HCT: 36.8 % (ref 36.0–46.0)
HEMOGLOBIN: 12.1 g/dL (ref 12.0–15.0)
LYMPHS ABS: 1.5 10*3/uL (ref 0.7–4.0)
Lymphocytes Relative: 20 % (ref 12–46)
MCH: 28.4 pg (ref 26.0–34.0)
MCHC: 32.9 g/dL (ref 30.0–36.0)
MCV: 86.4 fL (ref 78.0–100.0)
MONO ABS: 0.6 10*3/uL (ref 0.1–1.0)
MONOS PCT: 8 % (ref 3–12)
MPV: 9.7 fL (ref 8.6–12.4)
NEUTROS ABS: 5.4 10*3/uL (ref 1.7–7.7)
Neutrophils Relative %: 71 % (ref 43–77)
Platelets: 195 10*3/uL (ref 150–400)
RBC: 4.26 MIL/uL (ref 3.87–5.11)
RDW: 14.8 % (ref 11.5–15.5)
WBC: 7.6 10*3/uL (ref 4.0–10.5)

## 2015-01-29 LAB — TSH: TSH: 0.822 u[IU]/mL (ref 0.350–4.500)

## 2015-01-29 MED ORDER — RAMIPRIL 5 MG PO CAPS: 5.0000 mg | ORAL_CAPSULE | Freq: Every day | ORAL | Status: DC

## 2015-01-29 MED ORDER — ROSUVASTATIN CALCIUM 5 MG PO TABS: 5.0000 mg | ORAL_TABLET | Freq: Every day | ORAL | Status: DC

## 2015-01-29 MED ORDER — PREDNISONE 10 MG PO TABS: ORAL_TABLET | ORAL | Status: DC

## 2015-01-29 NOTE — Progress Notes (Signed)
Subjective:    Patient ID: Melissa Benson, female    DOB: 1960-11-17, 55 y.o.   MRN: [id number]  HPI  55 year old White Female in today with multiple questions and concerns. She had  laparoscopic Roux-en-Y gastric bypass surgery with lysis of adhesions by Dr. Daphine Deutscher January 9. She saw Dr. Daphine Deutscher for follow up  January 18 but was having a severe headache. She was sent to the emergency department where CT of the brain was negative. She has a history of headaches but this one was especially severe that day. She has Hydrocodone APAP prescribed for headaches and neck pain but was afraid to take it because of constipation concerns. She has never tried Relpax. I've given her a sample today to try for migraine headache. She's having difficulty sleeping. Suggested she take Benadryl at bedtime. I do not want to start her on chronic sleep medication. Her Accu-Cheks have been very good off of insulin. They range from 77-150.  Her endocrinologist is Dr. Lucianne Muss. We have reduced antihypertensive medication Ramipril from 10 mg to 5 mg daily. She is off Victoza. She continues with metformin. I'm going to try a short course of prednisone going from 40-0 mg over 5 days to see if that will help her headache.  A new problem today is that she feels exhausted in the shower. She's not had chest pain but just feels simply exhausted after taking a shower. We did an EKG and she has new T-wave inversions. She'll be seeing cardiologist next week. Her pulse oximetry is normal on room air. She's not short of breath or tired otherwise but clearly anxious status post surgery.  She had a physical examination in November and did not mention any issues with chest pain or shortness of breath or fatigue.  History of metabolic syndrome, diabetes mellitus, chronic neck pain, obesity, essential hypertension and hyperlipidemia.  Dr. Dareen Piano is GYN physician. History of obstructive sleep apnea.  Appendectomy 1985, cholecystectomy 1986,  bilateral tubal ligation 1984. Hysterectomy with one ovary removed 2003. Exploratory surgery for GYN issues 3 times.  Surgery for herniated cervical disc in 2004 and 2005. 2 pregnancies and no miscarriages.  Colonoscopy by Dr. Lilian Kapur September 2014 showing no adenomatous polyps.  Family history: Father with history of diabetes. Mother with history of heart disease. 3 brothers all of whom have substance abuse problems. 2 sisters one of whom is a diabetic  History of stress urinary incontinence.  Diabetic eye exam done by Dr. [facility] November 2016.  Social history: She is employed as a Haematologist. She is married and has 2 sons. Does not smoke.       Review of Systems as above issues with insomnia, fatigue in the shower, worried about constipation taking hydrocodone, headaches     Objective:   Physical Exam  Constitutional: She is oriented to person, place, and time. She appears well-developed and well-nourished. No distress.  HENT:  Head: Normocephalic and atraumatic.  Eyes: Right eye exhibits no discharge. Left eye exhibits no discharge.  Neck: Neck supple. No JVD present.  Cardiovascular: Normal rate, regular rhythm and normal heart sounds.   No murmur heard. Pulmonary/Chest: Effort normal and breath sounds normal. No respiratory distress. She has no wheezes. She has no rales.  Abdominal: Soft. Bowel sounds are normal. She exhibits no distension and no mass. There is no tenderness. There is no rebound and no guarding.  Musculoskeletal: She exhibits no edema.  Lymphadenopathy:    She has no cervical adenopathy.  Neurological: She is alert and oriented to person, place, and time.  Skin: Skin is warm and dry. She is not diaphoretic.  Psychiatric: Her behavior is normal. Judgment and thought content normal.  Anxious  Vitals reviewed.         Assessment & Plan:  Status post gastric bypass Roux-en-Y surgery for weight loss early January  History of  diabetes mellitus-now off insulin, Invokana,  and Victoza just taking metformin  Essential hypertension-Ramapril  reduced from 10 to 5 mg daily. Blood pressure elevated today but patient is anxious  Headaches-short taper prednisone going from 40 mg a 0 mg over 5 days  Anxiety-patient reassured. Not given anti-anxiety medication  Insomnia-take generic Benadryl which are to has at home  Easy fatigability in shower-new T-wave inversions on EKG with family history of coronary disease in mother. To see cardiologist next week. Pulse oximetry normal.  Plan: Upcoming cardiology consultation. CBC is normal. Hemoglobin A1c 6.8%. TSH is normal. Complete metabolic panel is normal with exception of nonfasting glucose of 119.  Addendum: Spoke with patient by phone Friday, January 27. Says she broke out in hives after taking prednisone which is unusual for patients to do so. She refuses to go back on prednisone. She took generic Benadryl and obtain some relief from hives. May have to try hydrocodone/APAP with headaches or an issue. However she'll need to take MiraLAX for constipation.

## 2015-01-30 LAB — HEMOGLOBIN A1C
HEMOGLOBIN A1C: 6.8 % — AB (ref <5.7)
MEAN PLASMA GLUCOSE: 148 mg/dL — AB (ref <117)

## 2015-01-31 ENCOUNTER — Encounter: Payer: Self-pay | Admitting: Internal Medicine

## 2015-01-31 NOTE — Patient Instructions (Addendum)
Trial of Relpax for migraine headache. Must take within one hour of onset of migraine to be effective. Consider Topamax of headaches not under good control. Cardiology consultation next week. Benadryl for insomnia.

## 2015-02-02 ENCOUNTER — Other Ambulatory Visit: Payer: BLUE CROSS/BLUE SHIELD

## 2015-02-03 ENCOUNTER — Ambulatory Visit (INDEPENDENT_AMBULATORY_CARE_PROVIDER_SITE_OTHER): Payer: BLUE CROSS/BLUE SHIELD | Admitting: Cardiology

## 2015-02-03 ENCOUNTER — Encounter: Payer: Self-pay | Admitting: Cardiology

## 2015-02-03 VITALS — BP 118/62 | HR 114 | Ht 66.0 in | Wt 207.4 lb

## 2015-02-03 DIAGNOSIS — I10 Essential (primary) hypertension: Secondary | ICD-10-CM

## 2015-02-03 DIAGNOSIS — G4733 Obstructive sleep apnea (adult) (pediatric): Secondary | ICD-10-CM

## 2015-02-03 DIAGNOSIS — Z9989 Dependence on other enabling machines and devices: Secondary | ICD-10-CM

## 2015-02-03 DIAGNOSIS — I208 Other forms of angina pectoris: Secondary | ICD-10-CM

## 2015-02-03 DIAGNOSIS — R5383 Other fatigue: Secondary | ICD-10-CM | POA: Diagnosis not present

## 2015-02-03 DIAGNOSIS — R9431 Abnormal electrocardiogram [ECG] [EKG]: Secondary | ICD-10-CM | POA: Diagnosis not present

## 2015-02-03 DIAGNOSIS — E119 Type 2 diabetes mellitus without complications: Secondary | ICD-10-CM

## 2015-02-03 DIAGNOSIS — Z9884 Bariatric surgery status: Secondary | ICD-10-CM | POA: Diagnosis not present

## 2015-02-03 NOTE — Patient Instructions (Signed)
Medication Instructions:  The current medical regimen is effective;  continue present plan and medications.  Testing/Procedures: Your physician has requested that you have an echocardiogram. Echocardiography is a painless test that uses sound waves to create images of your heart. It provides your doctor with information about the size and shape of your heart and how well your heart's chambers and valves are working. This procedure takes approximately one hour. There are no restrictions for this procedure.  Your physician has requested that you have a lexiscan myoview. For further information please visit [email or web address]. Please follow instruction sheet, as given.  Follow-Up: Follow up as needed after testing.  Thank you for choosing [facility]!!     

## 2015-02-03 NOTE — Progress Notes (Signed)
Cardiology Office Note    Date:  02/03/2015   ID:  Melissa Benson, DOB Oct 08, 1960, MRN [id number]  PCP:  Margaree Mackintosh, MD  Cardiologist:   Donato Schultz, MD     History of Present Illness:  Melissa Benson is a 55 y.o. female here for evaluation of abnormal EKG, new T-wave inversions on EKG with a family history of coronary artery disease in her mother at the request of Dr. Lenord Fellers.  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 She also has chronic neck pain. She simply feels exhausted after taking a shower. No significant chest pain. Tired. Clearly anxious postsurgery.  Her Roux-en-Y gastric bypass was by Dr. Daphine Deutscher on 01/12/2015. She then had severe headache on January 18 and was sent to the emergency room where CT of the brain was negative. She also sees Dr. Lucianne Muss with endocrinology.  IVU EKGs in the system, most recent on 11/13/14, nonspecific ST-T wave changes noted.  EKG from 01/29/15 when she had shortness of breath and fatigue in the shower showed T-wave inversion in V3 through V 6 as well as mild T-wave inversion in to 3 aVF. This was new when compared to prior EKG. Personally viewed.  It appears that in 2013 she had both nuclear stress test and echocardiogram at [facility].  Lately seems to improved. Felt SOB, breathing deep, getting dressed lay across bed. CXR - mild cardiomegaly.  She has mild tachycardia today 114 bpm. Her mother had bypass surgery and valve replacement.  Past Medical History  Diagnosis Date  . Hypertension   . Rosacea   . Psoriasis   . Diabetes mellitus   . OSA (obstructive sleep apnea)     on CPAP  . History of migraine headaches   . Neck pain, chronic   . Hyperlipidemia   . Complication of anesthesia   . PONV (postoperative nausea and vomiting)   . History of vertigo   . History of urinary tract infection       last one 11/2014   . Weak urinary stream   . GERD (gastroesophageal reflux disease)   . History of hiatal hernia   . Headache     no issues since stop caffeine    Past Surgical History  Procedure Laterality Date  . Cholecystectomy    . Appendectomy    . Tubal ligation    . Abdominal hysterectomy    . Spine surgery      C6-C7 fusion x2  . Spine surgery      herniated disc L4-L5  . Cardiovascular stress test  07/20/2011    Normal myocardial perfusion study, EKG negative for ischemia, no ECG changes  . 2d echocardiogram  07/20/2011    EF >55%, normal  . Dilation and curettage of uterus    . Colonscopy       polyps removed   . Diagnostic laparoscopy    . Laparoscopic roux-en-y gastric bypass with hiatal hernia repair N/A 01/12/2015    Procedure: LAPAROSCOPIC ROUX-EN-Y GASTRIC BYPASS WITH LYSIS OF ADHESIONS ;  Surgeon: Luretha Murphy, MD;  Location: [facility];  Service: General;  Laterality: N/A;    Outpatient Prescriptions Prior to Visit  Medication Sig Dispense Refill  . calcium-vitamin D 250-100 MG-UNIT tablet Take 1 tablet by mouth daily.    Marland Kitchen FIBER SELECT GUMMIES PO Take 1 each by mouth daily.    Marland Kitchen FINACEA 15 %  cream APPLY ONE APPLICATION TOPICALLY TO FACE EVERY MORNING  4  . glucose blood (BAYER CONTOUR NEXT TEST) test strip Use as instructed to check blood sugar 3 times per day dx code E11.65 100 each 5  . HYDROcodone-acetaminophen (NORCO) 5-325 MG tablet Take 1-2 tablets by mouth every 6 (six) hours as needed for moderate pain. 12 tablet 0  . Insulin Pen Needle (NOVOTWIST) 32G X 5 MM MISC Use daily to inject saxenda 50 each 3  . metFORMIN (GLUCOPHAGE) 500 MG tablet Take 1 tablet (500 mg total) by mouth 2 (two) times daily with a meal. 60 tablet 0  . mineral oil enema Place 1 enema rectally once.    . Multiple Vitamin (MULTIVITAMIN) tablet Take 1 tablet by mouth daily.    . ondansetron (ZOFRAN-ODT) 8 MG disintegrating tablet Take 1 tablet by mouth every 6 (six) hours as needed  for nausea.   0  . promethazine (PHENERGAN) 25 MG tablet TAKE 1 TABLET BY MOUTH EVERY 4 HOURS AS NEEDED FOR PAIN (Patient taking differently: TAKE 1 TABLET BY MOUTH EVERY 4 HOURS AS NEEDED FOR NAUSEA) 30 tablet 0  . ramipril (ALTACE) 5 MG capsule Take 1 capsule (5 mg total) by mouth daily. 90 capsule 3  . rosuvastatin (CRESTOR) 5 MG tablet Take 1 tablet (5 mg total) by mouth daily. 30 tablet 3  . vitamin B-12 (CYANOCOBALAMIN) 1000 MCG tablet Take 1,000 mcg by mouth daily.    Marland Kitchen HYDROcodone-acetaminophen (HYCET) 7.5-325 mg/15 ml solution Take 15 mLs by mouth 4 (four) times daily as needed for moderate pain. (Patient not taking: Reported on 02/03/2015) 120 mL 0  . predniSONE (DELTASONE) 10 MG tablet Take 4 tabs day 1 and decrease by one tab daily 4-3-2-1 taper for migraine (Patient not taking: Reported on 02/03/2015) 10 tablet 0   No facility-administered medications prior to visit.     Allergies:   Morphine and related   Social History   Social History  . Marital Status: Married    Spouse Name: N/A  . Number of Children: N/A  . Years of Education: N/A   Social History Main Topics  . Smoking status: Former Smoker -- 2.50 packs/day for 8 years    Types: Cigarettes    Quit date: 05/18/1983  . Smokeless tobacco: Never Used  . Alcohol Use: No  . Drug Use: No  . Sexual Activity: No   Other Topics Concern  . None   Social History Narrative     Family History:  The patient's family history includes Diabetes in her father, maternal grandfather, and mother; Heart attack in her maternal grandmother; Heart disease in her father, maternal grandfather, maternal grandmother, and mother; Hyperlipidemia in her brother; Hypertension in her brother, father, maternal grandfather, mother, and sister; Lung disease in her father. Father died with CAD and lungs.  ROS:   Please see the history of present illness.    ROS All other systems reviewed and are negative.   PHYSICAL EXAM:   VS:  BP 118/62  mmHg  Pulse 114  Ht  (1.676 m)  Wt 207 lb 6.4 oz (94.076 kg)  BMI 33.49 kg/m2  SpO2 97%   GEN: Well nourished, well developed, in no acute distress HEENT: normal Neck: no JVD, carotid bruits, or masses , prior cervical scar noted. Somewhat thickened neck. Cardiac:  Regular, mildly tachycardic; no murmurs, rubs, or gallops,no edema  Respiratory:  clear to auscultation bilaterally, normal work of breathing GI: soft, nontender, nondistended, + BS , overweight  MS: no deformity or atrophy Skin: warm and dry, no rash Neuro:  Alert and Oriented x 3, Strength and sensation are intact Psych: euthymic mood, full affect , anxious  Wt Readings from Last 3 Encounters:  02/03/15 207 lb 6.4 oz (94.076 kg)  01/29/15 207 lb 8 oz (94.121 kg)  01/28/15 208 lb 8 oz (94.575 kg)      Studies/Labs Reviewed:   EKG:  EKG is  Not ordered today.  Please see above for prior  Recent Labs: 01/29/2015: ALT 24; BUN 19; Creat 0.80; Hemoglobin 12.1; Platelets 195; Potassium 4.0; Sodium 139; TSH 0.822   Lipid Panel    Component Value Date/Time   CHOL 97* 10/28/2014 0901   TRIG 120 10/28/2014 0901   HDL 40* 10/28/2014 0901   CHOLHDL 2.4 10/28/2014 0901   VLDL 24 10/28/2014 0901   LDLCALC 33 10/28/2014 0901    Additional studies/ records that were reviewed today include:   EKGs reviewed, prior office notes, x-ray -mild stable cardiomegaly noted on 11/13/14   ASSESSMENT:    1. Abnormal EKG   2. Other fatigue   3. Lap Roux Y Gastric Bypass Jan 2017   4. Essential hypertension   5. Controlled type 2 diabetes mellitus without complication, without long-term current use of insulin (HCC)   6. OSA on CPAP   7. Angina decubitus (HCC)      PLAN:  In order of problems listed above:  1. With her new T-wave inversions on EKG , family history of coronary artery disease , recent gastric bypass surgery , shortness of breath , fatigue which could be interpreted as an anginal equivalent, we will proceed  with pharmacologic stress test to evaluate for possible ischemia. I will also check an echocardiogram to ensure proper structure and function of her heart , detect if she has any evidence of cardiomegaly or cardiomyopathy ( mild tachycardia noted today, may be anxiety ). 2.  Diabetes-she is now off of her insulin. Dr. Lucianne Muss had been monitoring. 3.  Obstruct sleep apnea- wears BiPAP. 4.  Fatigue-could be multifactorial including postsurgical fatigue. Continue with exercise, motion.    Medication Adjustments/Labs and Tests Ordered: Current medicines are reviewed at length with the patient today.  Concerns regarding medicines are outlined above.  Medication changes, Labs and Tests ordered today are listed in the Patient Instructions below. There are no Patient Instructions on file for this visit.     Mathews Robinsons, MD  02/03/2015 8:36 AM    [facility] [address] [place], [place], [place]  [address] Phone: [phone]; Fax: [phone]

## 2015-02-05 ENCOUNTER — Ambulatory Visit: Payer: BLUE CROSS/BLUE SHIELD | Admitting: Endocrinology

## 2015-02-11 ENCOUNTER — Telehealth (HOSPITAL_COMMUNITY): Payer: Self-pay | Admitting: *Deleted

## 2015-02-11 NOTE — Telephone Encounter (Signed)
Left message on voicemail per DPR in reference to upcoming appointment scheduled on 02/16/15 at 1000 with detailed instructions given per Myocardial Perfusion Study Information Sheet for the test. LM to arrive 15 minutes early, and that it is imperative to arrive on time for appointment to keep from having the test rescheduled. If you need to cancel or reschedule your appointment, please call the office within 24 hours of your appointment. Failure to do so may result in a cancellation of your appointment, and a $50 no show fee. Phone number given for call back for any questions. Antionette Char, RN

## 2015-02-16 ENCOUNTER — Ambulatory Visit (HOSPITAL_BASED_OUTPATIENT_CLINIC_OR_DEPARTMENT_OTHER): Payer: BLUE CROSS/BLUE SHIELD

## 2015-02-16 ENCOUNTER — Other Ambulatory Visit: Payer: Self-pay

## 2015-02-16 ENCOUNTER — Ambulatory Visit (HOSPITAL_COMMUNITY): Payer: BLUE CROSS/BLUE SHIELD | Attending: Internal Medicine

## 2015-02-16 DIAGNOSIS — E119 Type 2 diabetes mellitus without complications: Secondary | ICD-10-CM | POA: Diagnosis not present

## 2015-02-16 DIAGNOSIS — Z8249 Family history of ischemic heart disease and other diseases of the circulatory system: Secondary | ICD-10-CM | POA: Insufficient documentation

## 2015-02-16 DIAGNOSIS — R5383 Other fatigue: Secondary | ICD-10-CM | POA: Diagnosis not present

## 2015-02-16 DIAGNOSIS — R9431 Abnormal electrocardiogram [ECG] [EKG]: Secondary | ICD-10-CM | POA: Diagnosis not present

## 2015-02-16 DIAGNOSIS — I10 Essential (primary) hypertension: Secondary | ICD-10-CM

## 2015-02-16 DIAGNOSIS — I208 Other forms of angina pectoris: Secondary | ICD-10-CM | POA: Diagnosis not present

## 2015-02-16 DIAGNOSIS — I517 Cardiomegaly: Secondary | ICD-10-CM | POA: Diagnosis not present

## 2015-02-16 DIAGNOSIS — R079 Chest pain, unspecified: Secondary | ICD-10-CM | POA: Diagnosis present

## 2015-02-16 DIAGNOSIS — R0602 Shortness of breath: Secondary | ICD-10-CM | POA: Diagnosis not present

## 2015-02-16 LAB — MYOCARDIAL PERFUSION IMAGING
CHL CUP NUCLEAR SDS: 1
CHL CUP RESTING HR STRESS: 79 {beats}/min
CSEPPHR: 110 {beats}/min
LVDIAVOL: 86 mL
LVSYSVOL: 33 mL
RATE: 0.27
SRS: 1
SSS: 2
TID: 1.01

## 2015-02-16 MED ORDER — REGADENOSON 0.4 MG/5ML IV SOLN
0.4000 mg | Freq: Once | INTRAVENOUS | Status: AC
  Administered 2015-02-16: 0.4 mg via INTRAVENOUS

## 2015-02-16 MED ORDER — TECHNETIUM TC 99M SESTAMIBI GENERIC - CARDIOLITE
32.4000 | Freq: Once | INTRAVENOUS | Status: AC | PRN
  Administered 2015-02-16: 32 via INTRAVENOUS

## 2015-02-16 MED ORDER — TECHNETIUM TC 99M SESTAMIBI GENERIC - CARDIOLITE
10.4000 | Freq: Once | INTRAVENOUS | Status: AC | PRN
  Administered 2015-02-16: 10 via INTRAVENOUS

## 2015-02-18 ENCOUNTER — Other Ambulatory Visit: Payer: Self-pay | Admitting: Internal Medicine

## 2015-02-19 ENCOUNTER — Telehealth: Payer: Self-pay

## 2015-02-19 NOTE — Telephone Encounter (Signed)
Patient states that she is taking  metformin twice daily now where as before her appt in January she was taking  twice daily.

## 2015-02-19 NOTE — Telephone Encounter (Signed)
Left message for patient to return call.

## 2015-02-19 NOTE — Telephone Encounter (Signed)
Is pt taking this now?

## 2015-02-25 ENCOUNTER — Other Ambulatory Visit: Payer: Self-pay | Admitting: Internal Medicine

## 2015-02-25 NOTE — Telephone Encounter (Signed)
Is she still taking? If so refill x 3 months. May be able to come off some meds with gastric bypass surgery

## 2015-03-02 ENCOUNTER — Telehealth: Payer: Self-pay | Admitting: Cardiology

## 2015-03-02 NOTE — Telephone Encounter (Signed)
I left a detailed message on the pts VM, as she identified herself on her VM message, explaining her lab results from 1/26 as those are the newest in her chart.  I left our office number for her to call back if she has any questions.

## 2015-03-02 NOTE — Telephone Encounter (Signed)
New message ° ° °Pt calling for lab results °

## 2015-03-02 NOTE — Telephone Encounter (Signed)
The pt is advised and she verbalized understanding. 

## 2015-03-02 NOTE — Telephone Encounter (Signed)
Pt said she did not want her lab results,she wanted her test results. Her stress and echo test results.

## 2015-03-18 ENCOUNTER — Encounter: Payer: Self-pay | Admitting: Dietician

## 2015-03-18 ENCOUNTER — Encounter: Payer: BLUE CROSS/BLUE SHIELD | Attending: Internal Medicine | Admitting: Dietician

## 2015-03-18 DIAGNOSIS — Z01818 Encounter for other preprocedural examination: Secondary | ICD-10-CM | POA: Diagnosis present

## 2015-03-18 NOTE — Progress Notes (Signed)
  Follow-up visit:  8 Weeks Post-Operative RYGB Surgery  Medical Nutrition Therapy:  Appt start time: 0835 end time:  0915  Primary concerns today: Post-operative Bariatric Surgery Nutrition Management. Melissa Benson returns today having lost a total of 40 pounds. She is excited about her weight loss and reports that she is feeling well and has more energy. She is struggling with constipation and thinks she may have a hemorrhoid or similar issue. Making sure to meet fluid needs. Tolerating all recommended foods. Has not tried any vegetables.   Samples provided and patient instructed on proper use: Bariatric Advantage Calcium citrate chew (strawberry - qty 2) Lot#: [id number] Exp: 12/2015  Bariatric Advantage Calcium citrate chew (tropical orange - qty 2) Lot#: [id number] Exp: 8/17  Bariatric Advantage Calcium citrate chew (peanut butter chocolate - qty 2) Lot#: [id number] Exp: 12/2015  Bariatric Advantage Calcium citrate chew (chocolate - qty 2) Lot#: [id number] Exp: 11/2015  Bariatric Advantage Calcium citrate chew (caramel - qty 2) Lot#: [id number] Exp: 11/17  Surgery date: 01/12/2015 Surgery type: RYGB [facility] lbs on 12/11/2014 (highest weight 260 lbs) Weight today: 193.5 lbs Weight change: 15 lbs Total weight lost: 40.5 lbs (66.5 lbs)  TANITA  BODY COMP RESULTS  12/30/14 01/27/15 03/18/15   BMI (kg/m^2) 37.6 33.7 31.2   Fat Mass (lbs) 109 98.5 67   Fat Free Mass (lbs) 124 110 126.5   Total Body Water (lbs) 91 80.5 92.5     Preferred Learning Style:   No preference indicated   Learning Readiness:   Ready  24-hr recall: B (AM): Premier protein shake (30g) Snk (AM):   L (PM): 2 oz lunchmeat and cheese (14-20g)  Snk (4 PM): Premier protein shake (30g)  D (PM): Malawiturkey chili or 2 oz baked chicken or lean hamburger patty (14g)  Snk (PM): sometimes cheese  Fluid intake: 80-100 oz water + 22 oz protein shakes Estimated total protein intake: 88-94 g/day  Medications:  see list; less blood pressure medication and no longer on insulin or Victoza Supplementation: taking  CBG monitoring: 2x a day (morning and night) Average CBG per patient: 100-140 mg/dL Last patient reported U2VA1c: 6.8% at the end of January 2017  Using straws: no Drinking while eating: no Hair loss: no Carbonated beverages: no N/V/D/C: constipation Dumping syndrome: none  Recent physical activity:  Walking a lot at work   Progress Towards Goal(s):  In progress.  Handouts given during visit include:  Phase 3B lean protein + non starchy vegetables   Nutritional Diagnosis:  [place]-3.3 Overweight/obesity related to past poor dietary habits and physical inactivity as evidenced by patient w/ recent RYGB surgery following dietary guidelines for continued weight loss.     Intervention:  Nutrition counseling/diet advancement.  Teaching Method Utilized:  Visual Auditory Hands on  Barriers to learning/adherence to lifestyle change: none  Demonstrated degree of understanding via:  Teach Back   Monitoring/Evaluation:  Dietary intake, exercise, and body weight. Follow up in 2.5 months for 4.5 month post-op visit.

## 2015-03-18 NOTE — Patient Instructions (Addendum)
Goals:  Follow Phase 3B: High Protein + Non-Starchy Vegetables  Eat 3-6 small meals/snacks, every 3-5 hrs  Increase lean protein foods to meet 60g goal  Increase fluid intake to 64oz +  Avoid drinking 15 minutes before, during and 30 minutes after eating  Aim for >30 min of physical activity daily  Try Bariatric Advantage Calcium  Surgery date: 01/12/2015 Surgery type: RYGB [facility] lbs on 12/11/2014 (highest weight 260 lbs) Weight today: 193.5 lbs Weight change: 15 lbs Total weight lost: 40.5 lbs (66.5 lbs)  TANITA  BODY COMP RESULTS  12/30/14 01/27/15 03/18/15   BMI (kg/m^2) 37.6 33.7 31.2   Fat Mass (lbs) 109 98.5 67   Fat Free Mass (lbs) 124 110 126.5   Total Body Water (lbs) 91 80.5 92.5

## 2015-03-19 ENCOUNTER — Other Ambulatory Visit: Payer: Self-pay | Admitting: Internal Medicine

## 2015-05-01 DIAGNOSIS — L659 Nonscarring hair loss, unspecified: Secondary | ICD-10-CM | POA: Diagnosis not present

## 2015-05-15 ENCOUNTER — Ambulatory Visit (INDEPENDENT_AMBULATORY_CARE_PROVIDER_SITE_OTHER): Payer: BLUE CROSS/BLUE SHIELD | Admitting: Internal Medicine

## 2015-05-15 ENCOUNTER — Encounter: Payer: Self-pay | Admitting: Internal Medicine

## 2015-05-15 VITALS — BP 114/70 | HR 84 | Temp 98.2°F | Resp 18 | Ht 66.0 in | Wt 183.0 lb

## 2015-05-15 DIAGNOSIS — N390 Urinary tract infection, site not specified: Secondary | ICD-10-CM

## 2015-05-15 MED ORDER — NITROFURANTOIN MONOHYD MACRO 100 MG PO CAPS: 100.0000 mg | ORAL_CAPSULE | Freq: Two times a day (BID) | ORAL | Status: DC

## 2015-05-15 NOTE — Patient Instructions (Signed)
Urine culture pending. Macrobid 100 mg twice daily for 7 days.

## 2015-05-15 NOTE — Progress Notes (Signed)
   Subjective:    Patient ID: Melissa Benson, female    DOB: 10-02-1960, 55 y.o.   MRN: [id number]  HPI Onset of suprapubic pressure with urgency on Wednesday. Hx UTI symptoms last year treated with Macrobid but culture grew nothing at that time in May 2016. Subsequently presented to urgent care October 8 and October 31. Each time was treated with Bactrim and culture grew coagulase-negative Staph species October 8 and October 31. No fever chills nausea or vomiting. No CVA tenderness.    Review of Systems as above     Objective:   Physical Exam Cannot do urine dipstick because patient has taken Azo-Standard. Urine sent for culture and microscopic examination to lab.       Assessment & Plan:  Acute cystitis versus urethritis  Plan: Macrobid 100 mg twice daily for 7 days. Culture pending.

## 2015-05-16 LAB — URINALYSIS, ROUTINE W REFLEX MICROSCOPIC
Bilirubin Urine: NEGATIVE
Glucose, UA: NEGATIVE
KETONES UR: NEGATIVE
NITRITE: POSITIVE — AB
PH: 6.5 (ref 5.0–8.0)
Protein, ur: NEGATIVE
SPECIFIC GRAVITY, URINE: 1.012 (ref 1.001–1.035)

## 2015-05-16 LAB — URINALYSIS, MICROSCOPIC ONLY
Casts: NONE SEEN [LPF]
Crystals: NONE SEEN [HPF]
Squamous Epithelial / LPF: NONE SEEN [HPF] (ref <=5)
Yeast: NONE SEEN [HPF]

## 2015-05-18 LAB — CULTURE, URINE COMPREHENSIVE

## 2015-05-26 ENCOUNTER — Other Ambulatory Visit: Payer: BLUE CROSS/BLUE SHIELD | Admitting: Internal Medicine

## 2015-05-26 DIAGNOSIS — E119 Type 2 diabetes mellitus without complications: Secondary | ICD-10-CM | POA: Diagnosis not present

## 2015-05-26 DIAGNOSIS — E785 Hyperlipidemia, unspecified: Secondary | ICD-10-CM

## 2015-05-26 DIAGNOSIS — Z79899 Other long term (current) drug therapy: Secondary | ICD-10-CM | POA: Diagnosis not present

## 2015-05-26 LAB — HEPATIC FUNCTION PANEL
ALBUMIN: 4.3 g/dL (ref 3.6–5.1)
ALK PHOS: 64 U/L (ref 33–130)
ALT: 46 U/L — AB (ref 6–29)
AST: 36 U/L — AB (ref 10–35)
BILIRUBIN TOTAL: 0.4 mg/dL (ref 0.2–1.2)
Bilirubin, Direct: 0.1 mg/dL (ref <=0.2)
Indirect Bilirubin: 0.3 mg/dL (ref 0.2–1.2)
TOTAL PROTEIN: 6.4 g/dL (ref 6.1–8.1)

## 2015-05-26 LAB — LIPID PANEL
CHOL/HDL RATIO: 2 ratio (ref <=5.0)
Cholesterol: 98 mg/dL — ABNORMAL LOW (ref 125–200)
HDL: 48 mg/dL (ref >=46)
LDL Cholesterol: 34 mg/dL (ref <130)
TRIGLYCERIDES: 80 mg/dL (ref <150)
VLDL: 16 mg/dL (ref <30)

## 2015-05-26 LAB — HEMOGLOBIN A1C
HEMOGLOBIN A1C: 6.4 % — AB (ref <5.7)
Mean Plasma Glucose: 137 mg/dL

## 2015-05-28 ENCOUNTER — Encounter: Payer: Self-pay | Admitting: Internal Medicine

## 2015-05-28 ENCOUNTER — Telehealth: Payer: Self-pay | Admitting: Internal Medicine

## 2015-05-28 ENCOUNTER — Ambulatory Visit (INDEPENDENT_AMBULATORY_CARE_PROVIDER_SITE_OTHER): Payer: BLUE CROSS/BLUE SHIELD | Admitting: Internal Medicine

## 2015-05-28 VITALS — BP 126/70 | HR 88 | Temp 98.2°F | Resp 18 | Ht 66.0 in | Wt 178.0 lb

## 2015-05-28 DIAGNOSIS — L659 Nonscarring hair loss, unspecified: Secondary | ICD-10-CM

## 2015-05-28 DIAGNOSIS — F411 Generalized anxiety disorder: Secondary | ICD-10-CM

## 2015-05-28 DIAGNOSIS — K648 Other hemorrhoids: Secondary | ICD-10-CM

## 2015-05-28 DIAGNOSIS — E119 Type 2 diabetes mellitus without complications: Secondary | ICD-10-CM

## 2015-05-28 DIAGNOSIS — R519 Headache, unspecified: Secondary | ICD-10-CM

## 2015-05-28 DIAGNOSIS — R51 Headache: Secondary | ICD-10-CM | POA: Diagnosis not present

## 2015-05-28 DIAGNOSIS — I10 Essential (primary) hypertension: Secondary | ICD-10-CM

## 2015-05-28 DIAGNOSIS — Z8669 Personal history of other diseases of the nervous system and sense organs: Secondary | ICD-10-CM

## 2015-05-28 DIAGNOSIS — K625 Hemorrhage of anus and rectum: Secondary | ICD-10-CM

## 2015-05-28 DIAGNOSIS — E785 Hyperlipidemia, unspecified: Secondary | ICD-10-CM

## 2015-05-28 DIAGNOSIS — Z9884 Bariatric surgery status: Secondary | ICD-10-CM | POA: Diagnosis not present

## 2015-05-28 LAB — HEMOCCULT GUIAC POC 1CARD (OFFICE): FECAL OCCULT BLD: POSITIVE — AB

## 2015-05-28 MED ORDER — HYDROCORTISONE ACETATE 25 MG RE SUPP: 25.0000 mg | Freq: Two times a day (BID) | RECTAL | Status: DC

## 2015-05-28 MED ORDER — HYDROCORTISONE ACE-PRAMOXINE 1-1 % RE FOAM: 1.0000 | Freq: Two times a day (BID) | RECTAL | Status: DC

## 2015-05-28 MED ORDER — PROMETHAZINE HCL 25 MG PO TABS: 25.0000 mg | ORAL_TABLET | ORAL | Status: DC | PRN

## 2015-05-28 MED ORDER — HYDROCODONE-ACETAMINOPHEN 10-325 MG PO TABS: 1.0000 | ORAL_TABLET | Freq: Three times a day (TID) | ORAL | Status: DC | PRN

## 2015-05-28 NOTE — Addendum Note (Signed)
Addended by: Dierdre ForthHURCH, Kariel Skillman L on: 05/28/2015 04:12 PM   Modules accepted: Orders

## 2015-05-28 NOTE — Telephone Encounter (Signed)
Patient was instructed to contact her insurance company and call us back to let us know what suppositories they will cover.

## 2015-05-28 NOTE — Progress Notes (Signed)
   Subjective:    Patient ID: Melissa Benson, female    DOB: [birth year], 55 y.o.   MRN: [id number]  HPI Patient is status post gastric bypass surgery and in today for six-month recheck. In November 2016 prior to her surgery she weighed 227 pounds. Now weighs 178 pounds. Having some issues with constipation and has developed some rectal bleeding. Is taking 2 stool softeners daily. Tried MiraLAX and milk of magnesia but says stool softeners work better.  History of diabetes mellitus treated with metformin. Hemoglobin A1c has improved from 6.8% to 6.4%. Lipid panel is normal. She is on 5 mg of Crestor daily.   She has developed chronic daily headaches. Has been taking Tylenol daily. 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 This will be followed at next visit. She has hydrocodone/APAP 10/325 to take for severe migraine headaches. She does have a history of migraines. Hydrocodone/APAP 10/325 refilled today #90 with no refill. Last refill was November 2016.  She says that Protonix caused urticaria.  She has Phenergan for migraine which was refilled today.    Review of Systems see above     Objective:   Physical Exam Neck supple. Chest clear. Cardiac exam regular rate and rhythm. Extremities without edema. Anoscopy shows internal hemorrhoids. Mucus in rectal vault is guaiac positive.       Assessment & Plan:  Internal hemorrhoids-prescribed Anusol HC to use twice daily for 5-7 days. Continue stool softener to combat constipation  Controlled type 2 diabetes mellitus. 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 Cut down use of Tylenol.  Migraine headaches and chronic daily headache-refer to neurology  Anxiety-worries a lot about her health  Essential hypertension-stable on [place] real 5 mg daily  Hair Loss-she  says Melissa Benson recently checked TSH and it was normal. He advised her to take biotin.  Plan: See above and return in 6 months for physical examination. Immunizations are up-to-date. She is pleased with her weight loss.

## 2015-05-28 NOTE — Telephone Encounter (Signed)
Went to pick up her prescription for Anusol suppository and insurance denied it.  States they are too expensive - pharmacist states it was over $1000.    [facility] [address]  Work [phone]

## 2015-05-28 NOTE — Telephone Encounter (Signed)
Patient is to call and ask pharmacist what is covered with her insurance!!!!

## 2015-06-02 ENCOUNTER — Encounter: Payer: BLUE CROSS/BLUE SHIELD | Attending: Internal Medicine | Admitting: Dietician

## 2015-06-02 ENCOUNTER — Encounter: Payer: Self-pay | Admitting: Dietician

## 2015-06-02 DIAGNOSIS — Z01818 Encounter for other preprocedural examination: Secondary | ICD-10-CM | POA: Diagnosis not present

## 2015-06-02 NOTE — Patient Instructions (Addendum)
Goals:  Follow Phase 3B: High Protein + Non-Starchy Vegetables  Eat 3-6 small meals/snacks, every 3-5 hrs  Increase lean protein foods to meet 60g goal  Increase fluid intake to 64oz +  Avoid drinking 15 minutes before, during and 30 minutes after eating  Try having some yogurt just before bed to help stabilize blood sugars  Phase out protein shakes as you are able to tolerate more food  Look into BELT this summer  Start doing pool exercises   Surgery date: 01/12/2015 Surgery type: RYGB [facility] lbs on 12/11/2014 (highest weight 260 lbs) Weight today: 177.4 lbs Weight change: 16.1 lbs Total weight lost: 56.6 lbs (82.6 lbs)

## 2015-06-02 NOTE — Progress Notes (Signed)
  Follow-up visit:  4.5 months Post-Operative RYGB Surgery  Medical Nutrition Therapy:  Appt start time: 0805 end time:  850  Primary concerns today: Post-operative Bariatric Surgery Nutrition Management. Melissa Benson returns today having lost another 16 pounds in the last 2.5 months. She states that her goal is to come off of medications; does not really have a specific weight loss goal. Has already reduced diabetes and blood pressure medications. Blood sugar is dropping and then spiking in the middle of the night. Melissa Benson states that she will wake up to use the bathroom in the night and tests blood sugar if she feels dizzy. Usually has all or part of a protein shake around 8pm and then goes to bed around 10-11pm and wakes up around 6:30 am. Still having constipation, taking stool softeners and Milk of Magnesia. Has internal hemorrhoids. Patient states that she is meeting fluid needs.    Surgery date: 01/12/2015 Surgery type: [facility]YGB [facility] lbs on 12/11/2014 (highest weight 260 lbs) Weight today: 177.4 lbs Weight change: 16.1 lbs Total weight lost: 56.6 lbs (82.6 lbs)  TANITA  BODY COMP RESULTS  12/30/14 01/27/15 03/18/15 06/02/15   BMI (kg/m^2) 37.6 33.7 31.2 28.6   Fat Mass (lbs) 109 98.5 67 69.4   Fat Free Mass (lbs) 124 110 126.5 108   Total Body Water (lbs) 91 80.5 92.5 76.4     Preferred Learning Style:   No preference indicated   Learning Readiness:   Ready  24-hr recall: B (9AM): Premier protein shake (30g) Snk (AM):   L (1PM): "Snackers" 2 oz lunchmeat + 2 cheese sticks (20g)  Snk (4 PM): cheese stick (5g) D (PM): 2-3 oz grilled chicken or ground Malawiturkey patty or tuna with green beans (14-21g)  Snk (PM): another protein shake (30g)  Fluid intake: 80-100 oz water + 22 oz protein shakes Estimated total protein intake: 88-94 g/day  Medications: see list; less blood pressure medication and no longer on insulin or Victoza Supplementation: taking  CBG monitoring: 2x  a day (morning and night) Average CBG per patient: 125-130 mg/dL fasting in the am and [id number] mg/dL before lunch or supper Last patient reported A1c: 6.4%   Using straws: no Drinking while eating: no Hair loss: yes, taking Biotin  Carbonated beverages: no N/V/D/C: constipation, taking Milk of Magnesia and stool softeners Dumping syndrome: none  Recent physical activity:  Walking at the park after work  Progress Towards Goal(s):  In progress.  Handouts given during visit include:  none   Nutritional Diagnosis:  [place]-3.3 Overweight/obesity related to past poor dietary habits and physical inactivity as evidenced by patient w/ recent RYGB surgery following dietary guidelines for continued weight loss.     Intervention:  Nutrition counseling/diet advancement.  Teaching Method Utilized:  Visual Auditory Hands on  Barriers to learning/adherence to lifestyle change: none  Demonstrated degree of understanding via:  Teach Back   Monitoring/Evaluation:  Dietary intake, exercise, and body weight. Follow up in 3 months for 7.5 month post-op visit.

## 2015-06-12 ENCOUNTER — Ambulatory Visit: Payer: BLUE CROSS/BLUE SHIELD | Admitting: Neurology

## 2015-06-12 DIAGNOSIS — Z029 Encounter for administrative examinations, unspecified: Secondary | ICD-10-CM

## 2015-06-17 ENCOUNTER — Ambulatory Visit (INDEPENDENT_AMBULATORY_CARE_PROVIDER_SITE_OTHER): Payer: BLUE CROSS/BLUE SHIELD | Admitting: Physician Assistant

## 2015-06-17 VITALS — BP 110/62 | HR 84 | Temp 98.3°F | Resp 16 | Ht 64.5 in | Wt 179.4 lb

## 2015-06-17 DIAGNOSIS — N898 Other specified noninflammatory disorders of vagina: Secondary | ICD-10-CM

## 2015-06-17 DIAGNOSIS — N390 Urinary tract infection, site not specified: Secondary | ICD-10-CM | POA: Diagnosis not present

## 2015-06-17 DIAGNOSIS — R350 Frequency of micturition: Secondary | ICD-10-CM

## 2015-06-17 DIAGNOSIS — R82998 Other abnormal findings in urine: Secondary | ICD-10-CM

## 2015-06-17 DIAGNOSIS — R300 Dysuria: Secondary | ICD-10-CM | POA: Diagnosis not present

## 2015-06-17 LAB — POCT WET + KOH PREP
Trich by wet prep: ABSENT
Yeast by KOH: ABSENT
Yeast by wet prep: ABSENT

## 2015-06-17 LAB — POCT URINALYSIS DIP (MANUAL ENTRY)
BILIRUBIN UA: NEGATIVE
Blood, UA: NEGATIVE
Glucose, UA: NEGATIVE
Ketones, POC UA: NEGATIVE
Nitrite, UA: NEGATIVE
Protein Ur, POC: NEGATIVE
Spec Grav, UA: 1.01
UROBILINOGEN UA: 0.2
pH, UA: 7

## 2015-06-17 LAB — POC MICROSCOPIC URINALYSIS (UMFC): MUCUS RE: ABSENT

## 2015-06-17 MED ORDER — FLUCONAZOLE 150 MG PO TABS: 150.0000 mg | ORAL_TABLET | Freq: Once | ORAL | Status: DC

## 2015-06-17 MED ORDER — SULFAMETHOXAZOLE-TRIMETHOPRIM 800-160 MG PO TABS
1.0000 | ORAL_TABLET | Freq: Two times a day (BID) | ORAL | Status: DC
Start: 2015-06-17 — End: 2015-07-27

## 2015-06-17 NOTE — Progress Notes (Signed)
[facility] [address] [place] [address] [phone]  Date:  06/17/2015   Name:  Melissa Benson   DOB:  [birth year]   MRN:  [id number]  PCP:  Margaree Mackintosh, MD    History of Present Illness:  Melissa Benson is a 55 y.o. female patient who presents to [facility] for cc of vaginal discharge and dysuria. 4 days ago, she developed dysuria, urgency, frequency.  She took azo which helped.  No abdominal pain, fever.  She has some right flank pain.  White discharge, that has now been yellow, pruritic.  Non-odorous.     Patient Active Problem List   Diagnosis Date Noted  . 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 Controlled diabetes mellitus type II without complication (HCC) 11/10/2014  . Obesity 10/15/2012  . Hyperlipidemia 09/26/2012  . Chronic neck pain 12/04/2010  . History of migraine headaches 12/04/2010  . E11.9 06/03/2010  . Hypertension 06/03/2010  . OSA on CPAP 06/03/2010    Past Medical History  Diagnosis Date  . Hypertension   . Rosacea   . Psoriasis   . Diabetes mellitus   . OSA (obstructive sleep apnea)     on CPAP  . History of migraine headaches   . Neck pain, chronic   . Hyperlipidemia   . Complication of anesthesia   . PONV (postoperative nausea and vomiting)   . History of vertigo   . History of urinary tract infection     last one 11/2014   . Weak urinary stream   . GERD (gastroesophageal reflux disease)   . History of hiatal hernia   . Headache     no issues since stop caffeine    Past Surgical History  Procedure Laterality Date  . Cholecystectomy    . Appendectomy    . Tubal ligation    . Abdominal hysterectomy    . Spine surgery      C6-C7 fusion x2  . Spine surgery      herniated disc L4-L5  . Cardiovascular stress test  07/20/2011    Normal myocardial perfusion study, EKG negative for ischemia, no ECG changes  . 2d echocardiogram  07/20/2011    EF >55%, normal  . Dilation and curettage of uterus    .  Colonscopy       polyps removed   . Diagnostic laparoscopy    . Laparoscopic roux-en-y gastric bypass with hiatal hernia repair N/A 01/12/2015    Procedure: LAPAROSCOPIC ROUX-EN-Y GASTRIC BYPASS WITH LYSIS OF ADHESIONS ;  Surgeon: Luretha Murphy, MD;  Location: [facility];  Service: General;  Laterality: N/A;    Social History  Substance Use Topics  . Smoking status: Former Smoker -- 2.50 packs/day for 8 years    Types: Cigarettes    Quit date: 05/18/1983  . Smokeless tobacco: Never Used  . Alcohol Use: No    Family History  Problem Relation Age of Onset  . Diabetes Mother   . Heart disease Mother   . Hypertension Mother   . Lung disease Father   . Heart disease Father   . Hypertension Father   . Diabetes Father   . Hypertension Sister   . Hyperlipidemia Brother   . Hypertension Brother   . Heart attack Maternal Grandmother   . Heart disease Maternal Grandmother   . Hypertension Maternal Grandfather   . Diabetes Maternal Grandfather   . Heart disease Maternal Grandfather     Allergies  Allergen  Reactions  . Morphine And Related Nausea And Vomiting    Pt states also caused arm to turn black where medication was administered   . Pantoprazole Hives    Medication list has been reviewed and updated.  Current Outpatient Prescriptions on File Prior to Visit  Medication Sig Dispense Refill  . Biotin 5 MG CAPS Take by mouth.    . calcium-vitamin D 250-100 MG-UNIT tablet Take 1 tablet by mouth daily.    Marland Kitchen FINACEA 15 % cream APPLY ONE APPLICATION TOPICALLY TO FACE EVERY MORNING  4  . glucose blood (BAYER CONTOUR NEXT TEST) test strip Use as instructed to check blood sugar 3 times per day dx code E11.65 100 each 5  . HYDROcodone-acetaminophen (NORCO) 10-325 MG tablet Take 1 tablet by mouth every 8 (eight) hours as needed. 90 tablet 0  . hydrocortisone-pramoxine (PROCTOFOAM HC) rectal foam Place 1 applicator rectally 2 (two) times daily. 10 g 2  . metFORMIN (GLUCOPHAGE) 500 MG tablet  TAKE 1 TABLET(500 MG) BY MOUTH TWICE DAILY WITH A MEAL 60 tablet 5  . Multiple Vitamin (MULTIVITAMIN) tablet Take 1 tablet by mouth daily.    . promethazine (PHENERGAN) 25 MG tablet Take 1 tablet (25 mg total) by mouth every 4 (four) hours as needed. for pain 30 tablet 0  . ramipril (ALTACE) 5 MG capsule Take 1 capsule (5 mg total) by mouth daily. 90 capsule 3  . rosuvastatin (CRESTOR) 5 MG tablet Take 1 tablet (5 mg total) by mouth daily. 30 tablet 3  . vitamin B-12 (CYANOCOBALAMIN) 1000 MCG tablet Take 1,000 mcg by mouth daily.    . hydrocortisone (ANUSOL-HC) 25 MG suppository Place 1 suppository (25 mg total) rectally 2 (two) times daily. (Patient not taking: Reported on 06/17/2015) 20 suppository 2  . hydrocortisone (ANUSOL-HC) 25 MG suppository Place 1 suppository (25 mg total) rectally 2 (two) times daily. (Patient not taking: Reported on 06/17/2015) 12 suppository 2   No current facility-administered medications on file prior to visit.    ROS ROS otherwise unremarkable unless listed above.    Physical Examination: BP 110/62 mmHg  Pulse 84  Temp(Src) 98.3 F (36.8 C) (Oral)  Resp 16  Ht 5' 4.5" (1.638 m)  Wt 179 lb 6.4 oz (81.375 kg)  BMI 30.33 kg/m2  SpO2 96% Ideal Body Weight: Weight in (lb) to have BMI = 25: 147.6  Physical Exam  Constitutional: She is oriented to person, place, and time. She appears well-developed and well-nourished. No distress.  HENT:  Head: Normocephalic and atraumatic.  Right Ear: External ear normal.  Left Ear: External ear normal.  Eyes: Conjunctivae and EOM are normal. Pupils are equal, round, and reactive to light.  Cardiovascular: Normal rate.   Pulmonary/Chest: Effort normal. No respiratory distress.  Abdominal: Soft. Normal appearance and bowel sounds are normal. There is no hepatosplenomegaly. There is CVA tenderness (right). There is no tenderness at McBurney's point and negative Murphy's sign.  Neurological: She is alert and oriented to  person, place, and time.  Skin: Skin is warm and dry. She is not diaphoretic.  Psychiatric: She has a normal mood and affect. Her behavior is normal.   Results for orders placed or performed in visit on 06/17/15  POCT urinalysis dipstick  Result Value Ref Range   Color, UA yellow yellow   Clarity, UA clear clear   Glucose, UA negative negative   Bilirubin, UA negative negative   Ketones, POC UA negative negative   Spec Grav, UA 1.010  Blood, UA negative negative   pH, UA 7.0    Protein Ur, POC negative negative   Urobilinogen, UA 0.2    Nitrite, UA Negative Negative   Leukocytes, UA small (1+) (A) Negative  POCT Microscopic Urinalysis ([facility])  Result Value Ref Range   WBC,UR,HPF,POC None None WBC/hpf   RBC,UR,HPF,POC None None RBC/hpf   Bacteria None None, Too numerous to count   Mucus Absent Absent   Epithelial Cells, UR Per Microscopy None None, Too numerous to count cells/hpf      Assessment and Plan: Cleda MccreedyKathy M Mcsweeney is a 55 y.o. female who is here today for cc of urinary for cc of  frequency, urgency, and vaginal discharge. -treat with abx.  Sending urine culture.  Dysuria - Plan: Urine culture  Urinary frequency - Plan: POCT urinalysis dipstick, POCT Microscopic Urinalysis ([facility]), Urine culture  Vaginal discharge - Plan: POCT Wet + KOH Prep  Leukocytes in urine - Plan: sulfamethoxazole-trimethoprim (BACTRIM DS,SEPTRA DS) 800-160 MG tablet, fluconazole (DIFLUCAN) 150 MG tablet     Trena PlattStephanie Abiel Antrim, PA-C [facility] 06/17/2015 4:19 PM

## 2015-06-17 NOTE — Patient Instructions (Addendum)
IF you received an x-ray today, you will receive an invoice from [facility]. Please contact [facility] at [phone] with questions or concerns regarding your invoice.   IF you received labwork today, you will receive an invoice from [employer]. Please contact [facility] at [phone] with questions or concerns regarding your invoice.   Our billing staff will not be able to assist you with questions regarding bills from these companies.  You will be contacted with the lab results as soon as they are available. The fastest way to get your results is to activate your My Chart account. Instructions are located on the last page of this paperwork. If you have not heard from [place] regarding the results in 2 weeks, please contact this office.   Heavy hydration of water.  Tylenol or ibuprofen for the pain.  Urinary Tract Infection Urinary tract infections (UTIs) can develop anywhere along your urinary tract. Your urinary tract is your body's drainage system for removing wastes and extra water. Your urinary tract includes two kidneys, two ureters, a bladder, and a urethra. Your kidneys are a pair of bean-shaped organs. Each kidney is about the size of your fist. They are located below your ribs, one on each side of your spine. CAUSES Infections are caused by microbes, which are microscopic organisms, including fungi, viruses, and bacteria. These organisms are so small that they can only be seen through a microscope. Bacteria are the microbes that most commonly cause UTIs. SYMPTOMS  Symptoms of UTIs may vary by age and gender of the patient and by the location of the infection. Symptoms in young women typically include a frequent and intense urge to urinate and a painful, burning feeling in the bladder or urethra during urination. Older women and men are more likely to be tired, shaky, and weak and have muscle aches and abdominal pain. A fever may mean the  infection is in your kidneys. Other symptoms of a kidney infection include pain in your back or sides below the ribs, nausea, and vomiting. DIAGNOSIS To diagnose a UTI, your caregiver will ask you about your symptoms. Your caregiver will also ask you to provide a urine sample. The urine sample will be tested for bacteria and white blood cells. White blood cells are made by your body to help fight infection. TREATMENT  Typically, UTIs can be treated with medication. Because most UTIs are caused by a bacterial infection, they usually can be treated with the use of antibiotics. The choice of antibiotic and length of treatment depend on your symptoms and the type of bacteria causing your infection. HOME CARE INSTRUCTIONS  If you were prescribed antibiotics, take them exactly as your caregiver instructs you. Finish the medication even if you feel better after you have only taken some of the medication.  Drink enough water and fluids to keep your urine clear or pale yellow.  Avoid caffeine, tea, and carbonated beverages. They tend to irritate your bladder.  Empty your bladder often. Avoid holding urine for long periods of time.  Empty your bladder before and after sexual intercourse.  After a bowel movement, women should cleanse from front to back. Use each tissue only once. SEEK MEDICAL CARE IF:   You have back pain.  You develop a fever.  Your symptoms do not begin to resolve within 3 days. SEEK IMMEDIATE MEDICAL CARE IF:   You have severe back pain or lower abdominal pain.  You develop chills.  You have nausea or  vomiting.  You have continued burning or discomfort with urination. MAKE SURE YOU:   Understand these instructions.  Will watch your condition.  Will get help right away if you are not doing well or get worse.   This information is not intended to replace advice given to you by your health care provider. Make sure you discuss any questions you have with your health  care provider.   Document Released: 09/29/2004 Document Revised: 09/10/2014 Document Reviewed: 01/28/2011 Elsevier Interactive Patient Education [employer]2016 Elsevier Inc.

## 2015-06-18 ENCOUNTER — Other Ambulatory Visit: Payer: Self-pay | Admitting: Internal Medicine

## 2015-06-18 LAB — URINE CULTURE: Colony Count: 25000

## 2015-07-01 ENCOUNTER — Encounter: Payer: Self-pay | Admitting: Internal Medicine

## 2015-07-01 DIAGNOSIS — E119 Type 2 diabetes mellitus without complications: Secondary | ICD-10-CM | POA: Diagnosis not present

## 2015-07-01 DIAGNOSIS — H40012 Open angle with borderline findings, low risk, left eye: Secondary | ICD-10-CM | POA: Diagnosis not present

## 2015-07-01 LAB — HM DIABETES EYE EXAM

## 2015-07-02 ENCOUNTER — Other Ambulatory Visit: Payer: Self-pay | Admitting: Internal Medicine

## 2015-07-02 NOTE — Telephone Encounter (Signed)
Why does pt need 30 Phenergan every month? Is she really taking that many? If so why?

## 2015-07-03 ENCOUNTER — Ambulatory Visit: Payer: BLUE CROSS/BLUE SHIELD | Admitting: Neurology

## 2015-07-27 ENCOUNTER — Ambulatory Visit (INDEPENDENT_AMBULATORY_CARE_PROVIDER_SITE_OTHER): Payer: BLUE CROSS/BLUE SHIELD | Admitting: Neurology

## 2015-07-27 ENCOUNTER — Encounter: Payer: Self-pay | Admitting: Neurology

## 2015-07-27 VITALS — BP 110/70 | HR 72 | Ht 66.0 in | Wt 176.5 lb

## 2015-07-27 DIAGNOSIS — R202 Paresthesia of skin: Secondary | ICD-10-CM | POA: Diagnosis not present

## 2015-07-27 DIAGNOSIS — M545 Low back pain: Secondary | ICD-10-CM | POA: Diagnosis not present

## 2015-07-27 DIAGNOSIS — M546 Pain in thoracic spine: Secondary | ICD-10-CM | POA: Diagnosis not present

## 2015-07-27 DIAGNOSIS — G4441 Drug-induced headache, not elsewhere classified, intractable: Secondary | ICD-10-CM | POA: Diagnosis not present

## 2015-07-27 DIAGNOSIS — R519 Headache, unspecified: Secondary | ICD-10-CM | POA: Insufficient documentation

## 2015-07-27 DIAGNOSIS — G4440 Drug-induced headache, not elsewhere classified, not intractable: Secondary | ICD-10-CM

## 2015-07-27 DIAGNOSIS — R51 Headache: Secondary | ICD-10-CM

## 2015-07-27 MED ORDER — CYCLOBENZAPRINE HCL 5 MG PO TABS: 5.0000 mg | ORAL_TABLET | Freq: Every evening | ORAL | 3 refills | Status: DC | PRN

## 2015-07-27 NOTE — Patient Instructions (Addendum)
1.  Medication overuse headaches  - Stop hydrocodone and tylenol - limit these to twice per week only  - Start magnesium oxide 400-600mg  daily  - If no improvement with magnesium as a preventative, start topiramate 25mg  daily  2.  Mid-back muscular pain  - Start flexeril 5mg  at bedtime prn pain.  Side effects include sleepiness  3.  If your left finger remains numb, please call my office to schedule electrodiagnostic testing  Return to clinic 4 months

## 2015-07-27 NOTE — Progress Notes (Signed)
[facility] Clinic Note - Initial Visit   Date: 07/27/15  Melissa Benson MRN: [id number] DOB: 01/11/1960   Dear Dr. Lenord Fellers:  Thank you for your kind referral of Melissa Benson for consultation of headaches. Although her history is well known to you, please allow [place] to reiterate it for the purpose of our medical record. The patient was accompanied to the clinic by self.    History of Present Illness: Melissa Benson is a 55 y.o. right-handed Caucasian female with diabetes mellitus. GERD, hypertension, hyperlipidemia, obesity s/p gastric bypass (01/2015 with >70lb weight loss) presenting for evaluation of headaches.    She reports having headaches since her late 30s, described as bifrontal and achy/throbbing pain.  She reports having nearly a daily (at least 5 times/week) headaches for 20 years which she would treat with ibuprofen.  She does recall having improvement with iburpofen.  She occasionally would get nausea, but no vomiting, photophobia, or phonophobia.    She underwent gastric bypass surgery in January 2017 and following this, her headaches worsened.  Her headaches became more intense and associated with vomiting. CT head was normal.  She reports having a daily headache since then and did not have relief with tylenol, but was taking this daily.  Headaches usually lasts about 12 hours.  Rest does not improve her pain. She was started on hydrocodone by her PCP which has helped.  Now, she has headaches 4-5 times per week which she continues to treat daily with hydrocodone. Pain is ranked 3/10.  She did notice some improvement since stopping caffeine.   She reports having dry eyes due to contact lens and was switched to a different lens time.   She also complains of mid-back pain which started in May 2017. Pain is achy, throbbing, and nonradiating.      She woke up with left pinky numbness today.  She has previous cervical surgery 10 years ago.  She  does not report weakness of her hands.   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     There is no evidence of a cervical motor radiculopathy, median neuropathy at the wrist, or large fiber generalized sensorimotor polyneuropathy affecting the right side. However, a pure small fiber sensory polyneuropathy cannot be excluded based on this study.  Labs 2017:  HbA1c 6.4, TSH 0.88 Labs 2014:  508   Past Medical History:  Diagnosis Date  . Complication of anesthesia   . Diabetes mellitus   . GERD (gastroesophageal reflux disease)   . Headache    no issues since stop caffeine  . History of hiatal hernia   . History of migraine headaches   . History of urinary tract infection    last one 11/2014   . History of vertigo   . Hyperlipidemia   . Hypertension   . Neck pain, chronic   . OSA (obstructive sleep apnea)    on CPAP  . PONV (postoperative nausea and vomiting)   . Psoriasis   . Rosacea   . Weak urinary stream     Past Surgical History:  Procedure Laterality Date  . 2D ECHOCARDIOGRAM  07/20/2011   EF >55%, normal  . ABDOMINAL HYSTERECTOMY    . APPENDECTOMY    .  CARDIOVASCULAR STRESS TEST  07/20/2011   Normal myocardial perfusion study, EKG negative for ischemia, no ECG changes  . CHOLECYSTECTOMY    . colonscopy      polyps removed   . DIAGNOSTIC LAPAROSCOPY    . DILATION AND CURETTAGE OF UTERUS    . LAPAROSCOPIC ROUX-EN-Y GASTRIC BYPASS WITH HIATAL HERNIA REPAIR N/A 01/12/2015   Procedure: LAPAROSCOPIC ROUX-EN-Y GASTRIC BYPASS WITH LYSIS OF ADHESIONS ;  Surgeon: Luretha Murphy, MD;  Location: [facility];  Service: General;  Laterality: N/A;  . SPINE SURGERY     C6-C7 fusion x2  .  SPINE SURGERY     herniated disc L4-L5  . TUBAL LIGATION       Medications:  Outpatient Encounter Prescriptions as of 07/27/2015  Medication Sig Note  . Biotin 5 MG CAPS Take by mouth.   . calcium-vitamin D 250-100 MG-UNIT tablet Take 1 tablet by mouth daily. 06/17/2015: Per ptt taking 3 times a day  . FINACEA 15 % cream APPLY ONE APPLICATION TOPICALLY TO FACE EVERY MORNING   . glucose blood (BAYER CONTOUR NEXT TEST) test strip Use as instructed to check blood sugar 3 times per day dx code E11.65   . HYDROcodone-acetaminophen (NORCO) 10-325 MG tablet Take 1 tablet by mouth every 8 (eight) hours as needed.   . hydrocortisone (ANUSOL-HC) 25 MG suppository Place 1 suppository (25 mg total) rectally 2 (two) times daily.   . hydrocortisone (ANUSOL-HC) 25 MG suppository Place 1 suppository (25 mg total) rectally 2 (two) times daily.   . hydrocortisone-pramoxine (PROCTOFOAM HC) rectal foam Place 1 applicator rectally 2 (two) times daily.   . magnesium 30 MG tablet Take 30 mg by mouth 2 (two) times daily.   . metFORMIN (GLUCOPHAGE) 500 MG tablet TAKE 1 TABLET(500 MG) BY MOUTH TWICE DAILY WITH A MEAL   . Multiple Vitamin (MULTIVITAMIN) tablet Take 1 tablet by mouth daily.   . promethazine (PHENERGAN) 25 MG tablet Take 1 tablet (25 mg total) by mouth every 4 (four) hours as needed. for pain 06/17/2015: Per pt using for nausea  . ramipril (ALTACE) 5 MG capsule Take 1 capsule (5 mg total) by mouth daily.   . rosuvastatin (CRESTOR) 5 MG tablet TAKE 1 TABLET(5 MG) BY MOUTH DAILY   . vitamin B-12 (CYANOCOBALAMIN) 1000 MCG tablet Take 1,000 mcg by mouth daily.   . [DISCONTINUED] fluconazole (DIFLUCAN) 150 MG tablet Take 1 tablet (150 mg total) by mouth once. Repeat if needed   . [DISCONTINUED] sulfamethoxazole-trimethoprim (BACTRIM DS,SEPTRA DS) 800-160 MG tablet Take 1 tablet by mouth 2 (two) times daily.    No facility-administered encounter medications on file as of 07/27/2015.      Allergies:    Allergies  Allergen Reactions  . Morphine And Related Nausea And Vomiting    Pt states also caused arm to turn black where medication was administered   . Pantoprazole Hives    Family History: Family History  Problem Relation Age of Onset  . Diabetes Mother   . Heart disease Mother   . Hypertension Mother   . Lung disease Father   . Heart disease Father   . Hypertension Father   . Diabetes Father   . Hypertension Sister   . Hyperlipidemia Brother   . Hypertension Brother   . Heart attack Maternal Grandmother   . Heart disease Maternal Grandmother   . Hypertension Maternal Grandfather   . Diabetes Maternal Grandfather   . Heart disease Maternal Grandfather     Social History: Social History  Substance Use Topics  . Smoking status: Former Smoker    Packs/day: 2.50    Years: 8.00    Types: Cigarettes    Quit date: 05/18/1983  . Smokeless tobacco: Never Used  . Alcohol use No   Social History   Social History Narrative   Lives with husband and one of her children in a one story home.  Has 2 children.  Works as a Corporate treasurer at [employer].  Education: college.    Review of Systems:  CONSTITUTIONAL: No fevers, chills, night sweats, +weight loss.   EYES: No visual changes or eye pain ENT: No hearing changes.  No history of nose bleeds.   RESPIRATORY: No cough, wheezing and shortness of breath.   CARDIOVASCULAR: Negative for chest pain, and palpitations.   GI: Negative for abdominal discomfort, blood in stools or black stools.  No recent change in bowel habits.   GU:  No history of incontinence.   MUSCLOSKELETAL: +history of joint pain or swelling.  No myalgias.   SKIN: Negative for lesions, rash, and itching.   HEMATOLOGY/ONCOLOGY: Negative for prolonged bleeding, bruising easily, and swollen nodes.  No history of cancer.   ENDOCRINE: Negative for cold or heat intolerance, polydipsia or goiter.   PSYCH:  No depression or anxiety symptoms.   NEURO: As Above.   Vital  Signs:  BP 110/70   Pulse 72   Ht 5\' 6"  (1.676 m)   Wt 176 lb 8 oz (80.1 kg)   SpO2 99%   BMI 28.49 kg/m  Pain Scale: 3 on a scale of 0-10   General Medical Exam:   General:  Well appearing, comfortable.   Eyes/ENT: see cranial nerve examination.   Neck: No masses appreciated.  Full range of motion without tenderness.  No carotid bruits. Respiratory:  Clear to auscultation, good air entry bilaterally.   Cardiac:  Regular rate and rhythm, no murmur.   Extremities:  No deformities, edema, or skin discoloration.  Skin:  No rashes or lesions.  Neurological Exam: MENTAL STATUS including orientation to time, place, person, recent and remote memory, attention span and concentration, language, and fund of knowledge is normal.  Speech is not dysarthric.  CRANIAL NERVES: II:  No visual field defects.  Unremarkable fundi.   III-IV-VI: Pupils equal round and reactive to light.  Normal conjugate, extra-ocular eye movements in all directions of gaze.  No nystagmus.  No ptosis.   V:  Normal facial sensation.    VII:  Normal facial symmetry and movements.  VIII:  Normal hearing and vestibular function.   IX-X:  Normal palatal movement.   XI:  Normal shoulder shrug and head rotation.   XII:  Normal tongue strength and range of motion, no deviation or fasciculation.  MOTOR:  No atrophy, fasciculations or abnormal movements.  No pronator drift.  Tone is normal.    Right Upper Extremity:    Left Upper Extremity:    Deltoid  5/5   Deltoid  5/5   Biceps  5/5   Biceps  5/5   Triceps  5/5   Triceps  5/5   Wrist extensors  5/5   Wrist extensors  5/5   Wrist flexors  5/5   Wrist flexors  5/5   Finger extensors  5/5   Finger extensors  5/5   Finger flexors  5/5   Finger flexors  5/5   Dorsal interossei  5/5   Dorsal interossei  5/5   Abductor pollicis  5/5   Abductor pollicis  5/5   Tone (Ashworth scale)  0  Tone (Ashworth scale)  0   Right Lower Extremity:    Left Lower Extremity:    Hip flexors   5/5   Hip flexors  5/5   Hip extensors  5/5   Hip extensors  5/5   Knee flexors  5/5   Knee flexors  5/5   Knee extensors  5/5   Knee extensors  5/5   Dorsiflexors  5/5   Dorsiflexors  5/5   Plantarflexors  5/5   Plantarflexors  5/5   Toe extensors  5/5   Toe extensors  5/5   Toe flexors  5/5   Toe flexors  5/5   Tone (Ashworth scale)  0  Tone (Ashworth scale)  0   MSRs:  Reflexes are brisk and symmetric throughout 3+/4 in the upper and lower extremities, except 2+/4 at the ankles bilaterally  SENSORY:  Reduced temperature and pin prick over the left distal fifth digit in a circumferential pattern, otherwise, normal and symmetric perception of light touch, pinprick, and vibration  COORDINATION/GAIT: Normal finger-to- nose-finger.  Intact rapid alternating movements bilaterally.  Able to rise from a chair without using arms.  Gait narrow based and stable.    IMPRESSION/PLAN: 1.  Medication overuse headaches  - Stop hydrocodone and tylenol - limit to twice per week only  - Start magnesium oxide 400-600mg  daily as preventative  - If no improvement with magnesium, start topiramate 25mg  daily  2.  Mid-back muscular pain  - Start flexeril 5mg  at bedtime prn pain.  Side effects discussed  3.  Paresthesias of left fifth digit ?ulnar neuropathy vs C8 radiculopathy  - EMG declined  - Follow clinically  Return to clinic in 4 months.   The duration of this appointment visit was 50 minutes of face-to-face time with the patient.  Greater than 50% of this time was spent in counseling, explanation of diagnosis, planning of further management, and coordination of care.   Thank you for allowing me to participate in patient's care.  If I can answer any additional questions, I would be pleased to do so.    Sincerely,    Wilkie Zenon K. Allena Katz, DO

## 2015-09-02 ENCOUNTER — Telehealth: Payer: Self-pay | Admitting: Neurology

## 2015-09-02 ENCOUNTER — Encounter: Payer: Self-pay | Admitting: Dietician

## 2015-09-02 ENCOUNTER — Encounter: Payer: BLUE CROSS/BLUE SHIELD | Attending: Surgery | Admitting: Dietician

## 2015-09-02 DIAGNOSIS — E669 Obesity, unspecified: Secondary | ICD-10-CM

## 2015-09-02 DIAGNOSIS — Z029 Encounter for administrative examinations, unspecified: Secondary | ICD-10-CM | POA: Insufficient documentation

## 2015-09-02 MED ORDER — TOPIRAMATE 25 MG PO TABS: ORAL_TABLET | ORAL | 5 refills | Status: DC

## 2015-09-02 NOTE — Telephone Encounter (Signed)
Please inform patient Rx for topirmate 25mg  tab has been sent - she will take 1 tablet at bedtime x 1 month, then increase to 2 tablets at bedtime.  Melissa K. Allena KatzPatel, DO

## 2015-09-02 NOTE — Telephone Encounter (Signed)
Patient called and said that the magnesium is not working.   She would like to try the medication you had mentioned at her last visit.  (not the one for depression)

## 2015-09-02 NOTE — Telephone Encounter (Signed)
Patient notified

## 2015-09-02 NOTE — Telephone Encounter (Signed)
PT called and has a question about a medication/Dawn CB# [phone]

## 2015-09-02 NOTE — Progress Notes (Signed)
  Follow-up visit:  7.5 months Post-Operative RYGB Surgery  Medical Nutrition Therapy:  Appt start time: 0835 end time:  900  Primary concerns today: Post-operative Bariatric Surgery Nutrition Management. Melissa Benson returns today having lost another 9 lbs fat per body composition scale. She started taking Magnesium per neurologist and feels like this has helped her with headaches and constipation. Still taking 1 blood pressure pill, BP has been good! She also reports that her blood glucose has been more stable overnight with yogurt before bed.  Surgery date: 01/12/2015 Surgery type: RYGB Start weight at [facility]: 234 lbs on 12/11/2014 (highest weight 260 lbs) Weight today: 175.8 lbs Weight change: 2 lbs Total weight lost: 84.6 lbs  TANITA  BODY COMP RESULTS  12/30/14 01/27/15 03/18/15 06/02/15 09/02/15   BMI (kg/m^2) 37.6 33.7 31.2 28.6 28.4   Fat Mass (lbs) 109 98.5 67 69.4 60.8   Fat Free Mass (lbs) 124 110 126.5 108 115.0   Total Body Water (lbs) 91 80.5 92.5 76.4 81.2   Preferred Learning Style:   No preference indicated   Learning Readiness:   Ready  24-hr recall: B (9AM): Premier protein shake (30g) Snk (AM):   L (1PM): "Snackers" 2 oz lunchmeat + 2 cheese sticks (20g)  Snk (4 PM): cheese stick (5g) D (PM): 2-3 oz grilled chicken or ground [place] patty or tuna with green beans or meatloaf (14-21g)  Snk (PM): another protein shake (30g)  May have another protein shake if she feels like she has not met her goal.   Fluid intake: 80-100 oz water + 22 oz protein shakes Estimated total protein intake: 88-94 g/day  Medications: see list; less blood pressure medication and no longer on insulin or Victoza Supplementation: taking  CBG monitoring: 2x a day (morning and night) Average CBG per patient: 125-130 mg/dL fasting in the am and 145-150 mg/dL before lunch or supper Last patient reported A1c: 6.4%   Using straws: no Drinking while eating: no Hair loss: yes, taking Biotin   Carbonated beverages: no N/V/D/C: constipation resolved with Magnesium Dumping syndrome: none  Recent physical activity:  Walking at the park after work; more active overall at work and home  Progress Towards Goal(s):  In progress.  Handouts given during visit include:  none   Nutritional Diagnosis:  [place]-3.3 Overweight/obesity related to past poor dietary habits and physical inactivity as evidenced by patient w/ recent RYGB surgery following dietary guidelines for continued weight loss.     Intervention:  Nutrition counseling/diet advancement.  Teaching Method Utilized:  Visual Auditory Hands on  Barriers to learning/adherence to lifestyle change: none  Demonstrated degree of understanding via:  Teach Back   Monitoring/Evaluation:  Dietary intake, exercise, and body weight. Follow up in 4 months for 11 month post-op visit.

## 2015-09-02 NOTE — Patient Instructions (Addendum)
Goals:  Follow Phase 3B: High Protein + Non-Starchy Vegetables  Eat 3-6 small meals/snacks, every 3-5 hrs  Increase lean protein foods to meet 60g goal  Increase fluid intake to 64oz +  Avoid drinking 15 minutes before, during and 30 minutes after eating  Try having some yogurt just before bed to help stabilize blood sugars  Phase out protein shakes as you are able to tolerate more food  Start doing pool exercises   Surgery date: 01/12/2015 Surgery type: RYGB [facility]: [facility] lbs on 12/11/2014 (highest weight 260 lbs) Weight today: 175.8 lbs Weight change: 2 lbs Total weight lost: 84.6 lbs

## 2015-09-10 ENCOUNTER — Telehealth: Payer: Self-pay | Admitting: Internal Medicine

## 2015-09-10 NOTE — Telephone Encounter (Signed)
Calling to request refill on Hydrocodone 10-325mg .  Looks like it was last filled 05/28/15 when she was here for her 6 month follow up on BP.      Best contact # [phone]

## 2015-09-10 NOTE — Telephone Encounter (Signed)
Spoke with patient; advised that Dr. Allena KatzPatel had advised per his note in July that she should stop taking the Hydrocodone due to it causing headaches and she should cut back on Tylenol due to it causing headaches.  Patient states that she needs something for the back ache.  Advised that Dr. Lenord FellersBaxley was not going to provide this pain medication at this time for the above reason.  Patient advised she would take Ibuprofen.  Reminded patient the neurologist advised that she may have additional headaches.   Suggested that she contact Neurologist to see what she may take in lieu of these medications for her headaches.  She was given something per the note in July.

## 2015-09-10 NOTE — Telephone Encounter (Signed)
Neurologist wants her to stop Hydrocodone per note July

## 2015-09-14 ENCOUNTER — Encounter: Payer: Self-pay | Admitting: Internal Medicine

## 2015-09-14 ENCOUNTER — Ambulatory Visit (INDEPENDENT_AMBULATORY_CARE_PROVIDER_SITE_OTHER): Payer: BLUE CROSS/BLUE SHIELD | Admitting: Internal Medicine

## 2015-09-14 VITALS — BP 124/80 | HR 86 | Temp 98.8°F | Ht 66.0 in | Wt 173.0 lb

## 2015-09-14 DIAGNOSIS — R8290 Unspecified abnormal findings in urine: Secondary | ICD-10-CM

## 2015-09-14 DIAGNOSIS — R39198 Other difficulties with micturition: Secondary | ICD-10-CM

## 2015-09-14 DIAGNOSIS — N390 Urinary tract infection, site not specified: Secondary | ICD-10-CM

## 2015-09-14 DIAGNOSIS — Z23 Encounter for immunization: Secondary | ICD-10-CM | POA: Diagnosis not present

## 2015-09-14 LAB — POCT URINALYSIS DIPSTICK
BILIRUBIN UA: NEGATIVE
GLUCOSE UA: NEGATIVE
Ketones, UA: NEGATIVE
NITRITE UA: NEGATIVE
PH UA: 5
Protein, UA: NEGATIVE
Spec Grav, UA: <=1.005
Urobilinogen, UA: 0.2

## 2015-09-14 MED ORDER — CIPROFLOXACIN HCL 500 MG PO TABS: 500.0000 mg | ORAL_TABLET | Freq: Two times a day (BID) | ORAL | 0 refills | Status: DC

## 2015-09-14 NOTE — Progress Notes (Signed)
   Subjective:    Patient ID: Cleda MccreedyKathy M Conkle, female    DOB: [birth year], 55 y.o.   MRN: [id number]  HPI 55 year old Female with UTI symptoms onset Saturday September 9. She had UTI in May with only 20,000 colonies per milliliter Escherichia coli growing but it was sensitive to Cipro. She had similar symptoms in mid June treated urgent care. Culture there had multiple species 25,000 colonies per milliliter.  Patient says she can urinate and then go back to work sneeze and have urinary incontinence. No urge urinary incontinence. Has appointment with Dr. Dareen PianoAnderson later this week for GYN check. She plans to discuss urinary incontinence with him.  No fever or shaking chills. No complaint of back pain.  Feels pressure in her lower abdomen and do jury at at end of stream.  Denies vaginal discharge  Review of Systems     Objective:   Physical Exam No CVA tenderness. Urinalysis abnormal with 3+ LE. Culture sent.  Vaginal exam not performed     Assessment & Plan:  Acute UTI  Plan: Cipro 500 mg twice daily for 10 days. Follow-up with GYN physician regarding urinary incontinence. Consider urology referral.

## 2015-09-14 NOTE — Patient Instructions (Signed)
Cipro 500 mg twice daily for 10 days. Urine culture pending. See Dr. Dareen PianoAnderson later this week regarding urinary incontinence.

## 2015-09-14 NOTE — Addendum Note (Signed)
Addended by: Doree BarthelLOWE, CASANDRA on: 09/14/2015 10:37 AM   Modules accepted: Orders

## 2015-09-16 LAB — URINE CULTURE: Colony Count: >=100000

## 2015-09-17 DIAGNOSIS — Z6828 Body mass index (BMI) 28.0-28.9, adult: Secondary | ICD-10-CM | POA: Diagnosis not present

## 2015-09-17 DIAGNOSIS — N393 Stress incontinence (female) (male): Secondary | ICD-10-CM | POA: Diagnosis not present

## 2015-09-19 ENCOUNTER — Other Ambulatory Visit: Payer: Self-pay | Admitting: Internal Medicine

## 2015-10-02 DIAGNOSIS — G4733 Obstructive sleep apnea (adult) (pediatric): Secondary | ICD-10-CM | POA: Diagnosis not present

## 2015-10-19 ENCOUNTER — Other Ambulatory Visit: Payer: Self-pay | Admitting: Endocrinology

## 2015-10-19 ENCOUNTER — Other Ambulatory Visit: Payer: Self-pay | Admitting: Internal Medicine

## 2015-11-02 ENCOUNTER — Other Ambulatory Visit: Payer: Self-pay | Admitting: Internal Medicine

## 2015-11-02 DIAGNOSIS — N393 Stress incontinence (female) (male): Secondary | ICD-10-CM | POA: Diagnosis not present

## 2015-11-02 DIAGNOSIS — R3914 Feeling of incomplete bladder emptying: Secondary | ICD-10-CM | POA: Diagnosis not present

## 2015-11-10 ENCOUNTER — Other Ambulatory Visit: Payer: BLUE CROSS/BLUE SHIELD | Admitting: Internal Medicine

## 2015-11-10 DIAGNOSIS — E785 Hyperlipidemia, unspecified: Secondary | ICD-10-CM

## 2015-11-10 DIAGNOSIS — E119 Type 2 diabetes mellitus without complications: Secondary | ICD-10-CM | POA: Diagnosis not present

## 2015-11-10 DIAGNOSIS — Z0000 Encounter for general adult medical examination without abnormal findings: Secondary | ICD-10-CM | POA: Diagnosis not present

## 2015-11-10 DIAGNOSIS — I10 Essential (primary) hypertension: Secondary | ICD-10-CM | POA: Diagnosis not present

## 2015-11-10 LAB — CBC WITH DIFFERENTIAL/PLATELET
BASOS PCT: 0 %
Basophils Absolute: 0 cells/uL (ref 0–200)
EOS ABS: 40 {cells}/uL (ref 15–500)
Eosinophils Relative: 1 %
HCT: 37 % (ref 35.0–45.0)
Hemoglobin: 12.1 g/dL (ref 11.7–15.5)
Lymphocytes Relative: 41 %
Lymphs Abs: 1640 cells/uL (ref 850–3900)
MCH: 29.5 pg (ref 27.0–33.0)
MCHC: 32.7 g/dL (ref 32.0–36.0)
MCV: 90.2 fL (ref 80.0–100.0)
MONOS PCT: 6 %
MPV: 9.1 fL (ref 7.5–12.5)
Monocytes Absolute: 240 cells/uL (ref 200–950)
NEUTROS ABS: 2080 {cells}/uL (ref 1500–7800)
Neutrophils Relative %: 52 %
PLATELETS: 259 10*3/uL (ref 140–400)
RBC: 4.1 MIL/uL (ref 3.80–5.10)
RDW: 14.5 % (ref 11.0–15.0)
WBC: 4 10*3/uL (ref 3.8–10.8)

## 2015-11-10 LAB — LIPID PANEL
CHOL/HDL RATIO: 2 ratio (ref <5.0)
CHOLESTEROL: 112 mg/dL (ref <200)
HDL: 57 mg/dL (ref >50)
LDL Cholesterol: 38 mg/dL
TRIGLYCERIDES: 84 mg/dL (ref <150)
VLDL: 17 mg/dL (ref <30)

## 2015-11-10 LAB — COMPLETE METABOLIC PANEL WITH GFR
ALT: 71 U/L — AB (ref 6–29)
AST: 52 U/L — AB (ref 10–35)
Albumin: 4.3 g/dL (ref 3.6–5.1)
Alkaline Phosphatase: 61 U/L (ref 33–130)
BILIRUBIN TOTAL: 0.5 mg/dL (ref 0.2–1.2)
BUN: 20 mg/dL (ref 7–25)
CO2: 28 mmol/L (ref 20–31)
CREATININE: 0.76 mg/dL (ref 0.50–1.05)
Calcium: 9.6 mg/dL (ref 8.6–10.4)
Chloride: 104 mmol/L (ref 98–110)
GFR, Est African American: >89 mL/min (ref >=60)
GFR, Est Non African American: 89 mL/min (ref >=60)
GLUCOSE: 132 mg/dL — AB (ref 65–99)
Potassium: 4.3 mmol/L (ref 3.5–5.3)
SODIUM: 142 mmol/L (ref 135–146)
TOTAL PROTEIN: 6.4 g/dL (ref 6.1–8.1)

## 2015-11-10 LAB — TSH: TSH: 1.32 mIU/L

## 2015-11-11 LAB — MICROALBUMIN / CREATININE URINE RATIO

## 2015-11-11 LAB — VITAMIN D 25 HYDROXY (VIT D DEFICIENCY, FRACTURES): Vit D, 25-Hydroxy: 57 ng/mL (ref 30–100)

## 2015-11-12 ENCOUNTER — Encounter: Payer: Self-pay | Admitting: Internal Medicine

## 2015-11-12 ENCOUNTER — Ambulatory Visit (INDEPENDENT_AMBULATORY_CARE_PROVIDER_SITE_OTHER): Payer: BLUE CROSS/BLUE SHIELD | Admitting: Internal Medicine

## 2015-11-12 VITALS — BP 118/72 | HR 81 | Temp 97.6°F | Ht 64.25 in | Wt 173.0 lb

## 2015-11-12 DIAGNOSIS — N393 Stress incontinence (female) (male): Secondary | ICD-10-CM

## 2015-11-12 DIAGNOSIS — R51 Headache: Secondary | ICD-10-CM

## 2015-11-12 DIAGNOSIS — G4700 Insomnia, unspecified: Secondary | ICD-10-CM

## 2015-11-12 DIAGNOSIS — Z87440 Personal history of urinary (tract) infections: Secondary | ICD-10-CM | POA: Diagnosis not present

## 2015-11-12 DIAGNOSIS — E7800 Pure hypercholesterolemia, unspecified: Secondary | ICD-10-CM | POA: Diagnosis not present

## 2015-11-12 DIAGNOSIS — F411 Generalized anxiety disorder: Secondary | ICD-10-CM

## 2015-11-12 DIAGNOSIS — M542 Cervicalgia: Secondary | ICD-10-CM | POA: Diagnosis not present

## 2015-11-12 DIAGNOSIS — E119 Type 2 diabetes mellitus without complications: Secondary | ICD-10-CM | POA: Diagnosis not present

## 2015-11-12 DIAGNOSIS — I10 Essential (primary) hypertension: Secondary | ICD-10-CM

## 2015-11-12 DIAGNOSIS — R9431 Abnormal electrocardiogram [ECG] [EKG]: Secondary | ICD-10-CM

## 2015-11-12 DIAGNOSIS — Z0000 Encounter for general adult medical examination without abnormal findings: Secondary | ICD-10-CM | POA: Diagnosis not present

## 2015-11-12 DIAGNOSIS — K760 Fatty (change of) liver, not elsewhere classified: Secondary | ICD-10-CM

## 2015-11-12 DIAGNOSIS — Z9884 Bariatric surgery status: Secondary | ICD-10-CM

## 2015-11-12 DIAGNOSIS — R519 Headache, unspecified: Secondary | ICD-10-CM

## 2015-11-12 DIAGNOSIS — M5417 Radiculopathy, lumbosacral region: Secondary | ICD-10-CM

## 2015-11-12 DIAGNOSIS — G8929 Other chronic pain: Secondary | ICD-10-CM

## 2015-11-12 DIAGNOSIS — Z23 Encounter for immunization: Secondary | ICD-10-CM

## 2015-11-12 LAB — POCT URINALYSIS DIPSTICK
Bilirubin, UA: NEGATIVE
Blood, UA: NEGATIVE
Glucose, UA: NEGATIVE
Ketones, UA: NEGATIVE
LEUKOCYTES UA: NEGATIVE
NITRITE UA: NEGATIVE
PROTEIN UA: NEGATIVE
Spec Grav, UA: 1.015
UROBILINOGEN UA: 0.2
pH, UA: 6

## 2015-11-12 NOTE — Progress Notes (Signed)
Subjective:    Patient ID: Melissa Benson, female    DOB: February 01, 1960, 55 y.o.   MRN: [id number]  HPI  55 year old Female for health maintenance exam and evaluation of medical issues.Patient is status post gastric bypass surgery. She has history of diabetes, GE reflux, hyperlipidemia, essential hypertension. History of frequent headaches seen by a neurologist. History of chronic neck pain. History of sleep apnea, obesity, depression. History of recurrent urinary infections.  Past medical history: Appendectomy 1985. Cholecystectomy 1986. Bilateral tubal ligation 1984. Hysterectomy with one ovary removed 2003. Exploratory surgery for GYN issues 3 times. Surgery for herniated cervical disc in 2004 and 2005. 2 pregnancies and no miscarriages.  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 She has 2 sons.  Colonoscopy done by Melissa Benson September 2014 showed no adenomatous polyps.  Family history: Father with history of diabetes mellitus. Mother with history of heart disease. 3 brothers all of whom have substance abuse problems. 2 sisters, one of whom is a diabetic.  Sees Melissa Benson for diabetic treatment.  Weight loss surgery done January 2017. Reports having headache since her late 30s. Reports having nearly a daily headache at least 5 times a week for 20 years. Generally took ibuprofen. Occasionally will get nausea but no vomiting photophobia or phonophobia. Recently headaches have been more intense and associated with vomiting. CT of the head was normal. Has been placed on Topamax but she stopped the medication after one month because she said it made her feel jittery. She was having issues with elevated liver enzymes and stopped taking Tylenol and NSAIDS. Continues to have headaches despite stopping these analgesic medications.  Has mid back pain around the level of her shoulder blades which is worse with activity improved with rest. Flexeril helped some. Has noticed bilateral leg  cramps and burning sensation over the top of her foot. EMG in 2014 showed mild L5 radiculopathy. In October she had another EMG of the right upper and lower extremity. These findings were consistent with a very mild old intraspinal canal lesion consistent with radiculopathy affecting the right L5 nerve segment. Magnesium oxide has not helped headaches as a preventative.   She's been a patient here since 2006. At that time she weighed 251 pounds. She weighed 260 pounds in 2009. In 2009 her hemoglobin A1c was 9.1%. In 2010, she was started on insulin.  In 2014, she had lost down to 223 pounds. In 2016, she weighed 227, and today weighs 173 pounds  In September had an Escherichia coli UTI.  Hemoglobin A1c in May was 6.4%  In January she was found to have new T-wave inversions on EKG with family history of coronary artery disease in her mother and was sent to cardiologist. Apparently had nuclear stress test and echocardi[facility] at [facility] in 2013. She had normal knee clear medicine study February 2017 without evidence of ischemia. She also had 2-D echocardiogram at that time showing mild LVH.    Review of Systems See above    Objective:   Physical Exam  Constitutional: She is oriented to person, place, and time. She appears well-developed and well-nourished.  HENT:  Head: Normocephalic and atraumatic.  Right Ear: External ear normal.  Left Ear: External ear normal.  Mouth/Throat: No oropharyngeal exudate.  Eyes: Conjunctivae and EOM are normal. Pupils are equal, round, and reactive to light. Right eye exhibits no discharge. Left eye exhibits no discharge.  Neck: Neck supple. No JVD present. No thyromegaly  present.  Cardiovascular: Normal rate, regular rhythm and normal heart sounds.   No murmur heard. Breasts normal female  Pulmonary/Chest: She has no wheezes. She has no rales.  Abdominal: Soft. Bowel sounds are normal. She exhibits no distension and no mass. There is  no tenderness. There is no rebound and no guarding.  Genitourinary:  Genitourinary Comments: Deferred to Melissa Benson, GYN  Musculoskeletal: She exhibits no edema.  Neurological: She is alert and oriented to person, place, and time. She has normal reflexes. No cranial nerve deficit. Coordination normal.  Skin: Skin is warm and dry. No rash noted.  Psychiatric: She has a normal mood and affect. Her behavior is normal. Judgment and thought content normal.          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 Follow-up in early December  Fatty liver-likely causing elevated liver functions. Recheck at follow-up  Plan: Continue to follow-up with Melissa Benson regarding chronic daily headache. Continue see Melissa Benson regarding diabetes mellitus. Return in 6 months or as needed.

## 2015-11-17 ENCOUNTER — Other Ambulatory Visit: Payer: Self-pay | Admitting: Internal Medicine

## 2015-11-18 ENCOUNTER — Ambulatory Visit
Admission: RE | Admit: 2015-11-18 | Discharge: 2015-11-18 | Disposition: A | Discharge status: Home / Self Care | Payer: BLUE CROSS/BLUE SHIELD | Source: Ambulatory Visit | Attending: Internal Medicine | Admitting: Internal Medicine

## 2015-11-18 DIAGNOSIS — K760 Fatty (change of) liver, not elsewhere classified: Secondary | ICD-10-CM

## 2015-11-18 DIAGNOSIS — R7989 Other specified abnormal findings of blood chemistry: Secondary | ICD-10-CM | POA: Diagnosis not present

## 2015-11-20 DIAGNOSIS — N393 Stress incontinence (female) (male): Secondary | ICD-10-CM | POA: Diagnosis not present

## 2015-11-20 DIAGNOSIS — R3914 Feeling of incomplete bladder emptying: Secondary | ICD-10-CM | POA: Diagnosis not present

## 2015-11-30 DIAGNOSIS — N393 Stress incontinence (female) (male): Secondary | ICD-10-CM | POA: Diagnosis not present

## 2015-12-02 ENCOUNTER — Encounter: Payer: Self-pay | Admitting: Neurology

## 2015-12-02 ENCOUNTER — Ambulatory Visit (INDEPENDENT_AMBULATORY_CARE_PROVIDER_SITE_OTHER): Payer: BLUE CROSS/BLUE SHIELD | Admitting: Neurology

## 2015-12-02 VITALS — BP 120/68 | HR 75 | Ht 64.25 in | Wt 173.0 lb

## 2015-12-02 DIAGNOSIS — M549 Dorsalgia, unspecified: Secondary | ICD-10-CM | POA: Diagnosis not present

## 2015-12-02 DIAGNOSIS — G4440 Drug-induced headache, not elsewhere classified, not intractable: Secondary | ICD-10-CM

## 2015-12-02 DIAGNOSIS — M5416 Radiculopathy, lumbar region: Secondary | ICD-10-CM | POA: Diagnosis not present

## 2015-12-02 MED ORDER — TOPIRAMATE ER 25 MG PO CAP24: 25.0000 mg | ORAL_CAPSULE | Freq: Every day | ORAL | 5 refills | Status: DC

## 2015-12-02 MED ORDER — CYCLOBENZAPRINE HCL 5 MG PO TABS: 5.0000 mg | ORAL_TABLET | Freq: Two times a day (BID) | ORAL | 5 refills | Status: DC

## 2015-12-02 MED ORDER — TOPIRAMATE ER 25 MG PO CAP24: 25.0000 mg | ORAL_CAPSULE | Freq: Every day | ORAL | 0 refills | Status: DC

## 2015-12-02 NOTE — Progress Notes (Signed)
Follow-up Visit   Date: 12/02/15    Melissa Benson MRN: [id number] DOB: Jul 01, 1960   Interim History: Melissa Benson is a 55 y.o. ight-handed Caucasian female with diabetes mellitus. GERD, hypertension, hyperlipidemia, obesity s/p gastric bypass (01/2015 with >70lb weight loss) returning to the clinic for follow-up of chronic daily headaches.  The patient was accompanied to the clinic by self.  History of present illness: She reports having headaches since her late 30s, described as bifrontal and achy/throbbing pain.  She reports having nearly a daily (at least 5 times/week) headaches for 20 years which she would treat with ibuprofen.  She does recall having improvement with iburpofen.  She occasionally would get nausea, but no vomiting, photophobia, or phonophobia.    She underwent gastric bypass surgery in January 2017 and following this, her headaches worsened.  Her headaches became more intense and associated with vomiting. CT head was normal.  She reports having a daily headache since then and did not have relief with tylenol, but was taking this daily.  Headaches usually lasts about 12 hours.  Rest does not improve her pain. She was started on hydrocodone by her PCP which has helped.  Now, she has headaches 4-5 times per week which she continues to treat daily with hydrocodone. Pain is ranked 3/10.  She did notice some improvement since stopping caffeine.   She reports having dry eyes due to contact lens and was switched to a different lens time. She also complains of mid-back pain which started in May 2017. Pain is achy, throbbing, and nonradiating.      She woke up with left pinky numbness today.  She has previous cervical surgery 10 years ago.  She does not report weakness of her hands.   UPDATE 12/02/2015:  She is here for 4 month follow-up appointment.  At her last visit, I recommended that she start topiramate 25mg  for one month, then increase to 50mg , but she stopped  the medication after one month because it made her feel jittery.  Because of her liver enzymes being elevated, she had stopped taking tylenol and NSAIDs over the past few weeks and has not noticed any change in her headache frequency.  She continues to have bifrontal headaches daily, but does not take anything besides magnesium for this.  She also complains of mid-back pain at the level of the shoulder blades which is worse with activity and improved with rest.  Flexeril and stretching helps some.  She has noticed new bilateral leg cramps and still has burning sensation over the top of her foot. Her previous EMG from 2014 showed mild L5 radiculopathy.  She does not have radicular leg pain, weakness, or low back discomfort.   Her tingling of the left pinky has resolved.    Medications:  Current Outpatient Prescriptions on File Prior to Visit  Medication Sig Dispense Refill  . BAYER CONTOUR NEXT TEST test strip USE AS INSTRUCTED TO CHECK BLOOD SUGAR THREE TIMES DAILY 100 each prn  . Biotin 5 MG CAPS Take by mouth.    . calcium-vitamin D 250-100 MG-UNIT tablet Take 1 tablet by mouth daily.    . hydrocortisone-pramoxine (PROCTOFOAM HC) rectal foam Place 1 applicator rectally 2 (two) times daily. 10 g 2  . MAGNESIUM PO Take 600 mg by mouth daily.    . metFORMIN (GLUCOPHAGE) 500 MG tablet TAKE 1 TABLET(500 MG) BY MOUTH TWICE DAILY WITH A MEAL 60 tablet 5  . Multiple Vitamin (MULTIVITAMIN) tablet Take 1 tablet  by mouth daily.    . promethazine (PHENERGAN) 25 MG tablet TAKE 1 TABLET(25 MG) BY MOUTH EVERY 4 HOURS AS NEEDED 30 tablet 0  . ramipril (ALTACE) 5 MG capsule Take 1 capsule (5 mg total) by mouth daily. 90 capsule 3  . vitamin B-12 (CYANOCOBALAMIN) 1000 MCG tablet Take 1,000 mcg by mouth daily.    . rosuvastatin (CRESTOR) 5 MG tablet TAKE 1 TABLET(5 MG) BY MOUTH DAILY (Patient not taking: Reported on 12/02/2015) 30 tablet 5   No current facility-administered medications on file prior to visit.      Allergies:  Allergies  Allergen Reactions  . Morphine And Related Nausea And Vomiting    Pt states also caused arm to turn black where medication was administered   . Pantoprazole Hives    Review of Systems:  CONSTITUTIONAL: No fevers, chills, night sweats, or weight loss.  EYES: No visual changes or eye pain ENT: No hearing changes.  No history of nose bleeds.   RESPIRATORY: No cough, wheezing and shortness of breath.   CARDIOVASCULAR: Negative for chest pain, and palpitations.   GI: Negative for abdominal discomfort, blood in stools or black stools.  No recent change in bowel habits.   GU:  No history of incontinence.   MUSCLOSKELETAL: No history of joint pain or swelling.  No myalgias.   SKIN: Negative for lesions, rash, and itching.   ENDOCRINE: Negative for cold or heat intolerance, polydipsia or goiter.   PSYCH:  No depression or anxiety symptoms.   NEURO: As Above.   Vital Signs:  BP 120/68   Pulse 75   Ht 5' 4.25" (1.632 m)   Wt 173 lb (78.5 kg)   SpO2 99%   BMI 29.46 kg/m   Neurological Exam: MENTAL STATUS including orientation to time, place, person, recent and remote memory, attention span and concentration, language, and fund of knowledge is normal.  Speech is not dysarthric.  CRANIAL NERVES:  Face is symmetric.   MOTOR:  Motor strength is 5/5 in all extremities.  No pronator drift.  Tone is normal.    MSRs:  Reflexes are 2+/4 throughout.  SENSORY:  Intact to to vibration throughout.  COORDINATION/GAIT:   Gait narrow based and stable.   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 radiculopathy) affecting the right L5 nerve/segment.   There is no evidence of a cervical motor radiculopathy, median neuropathy at the wrist, or large fiber generalized sensorimotor polyneuropathy affecting the right side. However, a  pure small fiber sensory polyneuropathy cannot be excluded based on this study.  Labs 2017:  HbA1c 6.4, TSH 0.88 Labs 2014:  508   IMPRESSION/PLAN: 1.  Chronic daily headaches.  She is stopped all OTC medications due to mild transaminitis and has no noticed any marked improvement in headache, suggesting these are more chronic daily headaches, then medication overuse.               - No benefit with magnesium oxide 400-600mg  daily as preventative.  She did not tolerate topiramate due to adverse effects (jitteriness)   - Start Trokendi 25mg  daily - samples provided  2.  Mid-back muscular pain, cramps              - Increase flexeril to 5mg  BID.  Side effects discussed   - PT declined   - Encouraged stretching of the legs and stay well  hydrated  3.  Paresthesias of left fifth digit ?ulnar neuropathy vs C8 radiculopathy - resolved  4.  Paresthesias over the dorsum of the feet bilaterally, previous EMG shows right L5 radiculopathy which is consistent with her symptoms.  There is no weakness or radicular pain, so will continue to follow clinically.  Unless she has acute worsening, no need for MRI.  Recommend stretching exercises  Return to clinic in 4 months.   The duration of this appointment visit was 30 minutes of face-to-face time with the patient.  Greater than 50% of this time was spent in counseling, explanation of diagnosis, planning of further management, and coordination of care.   Thank you for allowing me to participate in patient's care.  If I can answer any additional questions, I would be pleased to do so.    Sincerely,    Donika K. Allena KatzPatel, DO

## 2015-12-02 NOTE — Patient Instructions (Addendum)
Continue same medications and return in 6 months. Continue to see neurologist regarding headaches. Follow-up early December for elevated liver functions. Have ultrasound of liver.

## 2015-12-02 NOTE — Patient Instructions (Signed)
1.  Start Trokendi 25mg  daily 2.  Increase flexeril to 5mg  twice daily for muscle cramps and back pain 3.  Start a stretching regimen for your legs and back 4.  Call my office if you would like to start physical therapy  Return to clinic in 4 months

## 2015-12-08 ENCOUNTER — Other Ambulatory Visit: Payer: BLUE CROSS/BLUE SHIELD | Admitting: Internal Medicine

## 2015-12-08 DIAGNOSIS — I10 Essential (primary) hypertension: Secondary | ICD-10-CM | POA: Diagnosis not present

## 2015-12-08 LAB — HEPATIC FUNCTION PANEL
ALK PHOS: 69 U/L (ref 33–130)
ALT: 28 U/L (ref 6–29)
AST: 25 U/L (ref 10–35)
Albumin: 4.1 g/dL (ref 3.6–5.1)
BILIRUBIN INDIRECT: 0.5 mg/dL (ref 0.2–1.2)
Bilirubin, Direct: 0.1 mg/dL (ref <=0.2)
TOTAL PROTEIN: 6.5 g/dL (ref 6.1–8.1)
Total Bilirubin: 0.6 mg/dL (ref 0.2–1.2)

## 2015-12-10 ENCOUNTER — Ambulatory Visit (INDEPENDENT_AMBULATORY_CARE_PROVIDER_SITE_OTHER): Payer: BLUE CROSS/BLUE SHIELD | Admitting: Internal Medicine

## 2015-12-10 VITALS — BP 124/82 | HR 80

## 2015-12-10 DIAGNOSIS — K760 Fatty (change of) liver, not elsewhere classified: Secondary | ICD-10-CM

## 2015-12-10 DIAGNOSIS — R51 Headache: Secondary | ICD-10-CM

## 2015-12-10 DIAGNOSIS — R748 Abnormal levels of other serum enzymes: Secondary | ICD-10-CM | POA: Diagnosis not present

## 2015-12-10 DIAGNOSIS — R519 Headache, unspecified: Secondary | ICD-10-CM

## 2015-12-15 ENCOUNTER — Other Ambulatory Visit: Payer: Self-pay | Admitting: Internal Medicine

## 2015-12-22 DIAGNOSIS — Z1231 Encounter for screening mammogram for malignant neoplasm of breast: Secondary | ICD-10-CM | POA: Diagnosis not present

## 2015-12-22 DIAGNOSIS — Z6828 Body mass index (BMI) 28.0-28.9, adult: Secondary | ICD-10-CM | POA: Diagnosis not present

## 2015-12-22 DIAGNOSIS — Z01419 Encounter for gynecological examination (general) (routine) without abnormal findings: Secondary | ICD-10-CM | POA: Diagnosis not present

## 2016-01-03 ENCOUNTER — Other Ambulatory Visit: Payer: Self-pay | Admitting: Internal Medicine

## 2016-01-06 ENCOUNTER — Ambulatory Visit: Payer: BLUE CROSS/BLUE SHIELD | Admitting: Dietician

## 2016-01-19 ENCOUNTER — Other Ambulatory Visit: Payer: Self-pay | Admitting: Internal Medicine

## 2016-02-02 ENCOUNTER — Other Ambulatory Visit: Payer: Self-pay | Admitting: Internal Medicine

## 2016-02-03 ENCOUNTER — Encounter: Payer: Self-pay | Admitting: Internal Medicine

## 2016-02-03 NOTE — Progress Notes (Signed)
   Subjective:    Patient ID: Melissa Benson, female    DOB: 1960/08/19, 56 y.o.   MRN: [id number]  HPI 2 months ago she was found to have very mild elevation of SGOT at 52 and SGPT of 71. Also she saw neurologist regarding medication overuse syndrome.  She recently had liver functions which are now normal.  She had ultrasound of the abdomen showing mild diffuse increased hepatic echogenicity most calmly seen with hepatic steatosis.  We talked briefly about management of migraine headaches and medication overuse syndrome.  I think a combination of medication overuse and fatty liver caused elevation of liver functions. She is status post cholecystectomy.  Apparently is getting better control of her headaches.  Review of Systems see above     Objective:   Physical Exam No hepatomegaly. No splenomegaly.       Assessment & Plan:  Fatty liver  Mild elevation of liver functions-resolved. Patient cut back on Tylenol and end-stage of the past few weeks and has not noticed any change in headache frequency. Was started on Topamax by neurologist but did not continue it. She really should give this a try.  History of migraine headaches  Plan: Patient reassured and we will continue to monitor her liver functions every 6-12 months.

## 2016-02-03 NOTE — Patient Instructions (Signed)
Watch analgesic consumption. Please consider trying Topamax once again at low dose of 25 mg daily for headache control. Try to watch diet neck or size. Weight loss will help fatty liver. Follow-up in 6 months.

## 2016-03-19 ENCOUNTER — Other Ambulatory Visit: Payer: Self-pay | Admitting: Internal Medicine

## 2016-03-21 ENCOUNTER — Ambulatory Visit (INDEPENDENT_AMBULATORY_CARE_PROVIDER_SITE_OTHER): Payer: BLUE CROSS/BLUE SHIELD | Admitting: Internal Medicine

## 2016-03-21 ENCOUNTER — Encounter: Payer: Self-pay | Admitting: Internal Medicine

## 2016-03-21 VITALS — BP 110/76 | HR 78 | Temp 98.7°F | Wt 172.0 lb

## 2016-03-21 DIAGNOSIS — J22 Unspecified acute lower respiratory infection: Secondary | ICD-10-CM | POA: Diagnosis not present

## 2016-03-21 MED ORDER — LEVOFLOXACIN 500 MG PO TABS: 500.0000 mg | ORAL_TABLET | Freq: Every day | ORAL | 0 refills | Status: DC

## 2016-03-21 MED ORDER — HYDROCODONE-HOMATROPINE 5-1.5 MG/5ML PO SYRP: 5.0000 mL | ORAL_SOLUTION | Freq: Three times a day (TID) | ORAL | 0 refills | Status: DC | PRN

## 2016-03-21 NOTE — Patient Instructions (Signed)
Levaquin 500 milligrams daily for 10 days. Hycodan 1 teaspoon by mouth every 8 hours when necessary cough. Rest and drink plenty of fluids. 

## 2016-03-21 NOTE — Progress Notes (Signed)
   Subjective:    Patient ID: Melissa Benson, female    DOB: [birth year], 56 y.o.   MRN: [id number]  HPI  56 year old Female with cough for 4 weeks. Started out as a sore throat after she was around a granddaughter who was a healed. Then developed into a cough which was mostly dry but fairly aggravating. No fever or shaking chills or myalgias. She did take flu vaccine. Not much sputum production. Sometimes has sputum, when she sneezes.    Review of Systems see above     Objective:   Physical Exam  Skin warm and dry. Pharynx noninjected. TMs are clear. Neck is supple without adenopathy. Chest clear to auscultation.      Assessment & Plan:  Acute bronchitis  Plan: Levaquin 500 milligrams daily for 10 days. Hycodan 1 teaspoon by mouth every 8 hours when necessary cough. Rest and drink plenty of fluids.

## 2016-03-21 NOTE — Progress Notes (Deleted)
   Subjective:    Patient ID: Melissa Benson, female    DOB: [birth year], 56 y.o.   MRN: [id number]  HPI 56 year old Female coughing x one month. Started with sorethroat after being around granddaughter. No fever or chills. No myalgias.    Review of Systems     Objective:   Physical Exam        Assessment & Plan:

## 2016-03-21 NOTE — Progress Notes (Deleted)
.  is

## 2016-04-06 ENCOUNTER — Ambulatory Visit (INDEPENDENT_AMBULATORY_CARE_PROVIDER_SITE_OTHER): Payer: BLUE CROSS/BLUE SHIELD | Admitting: Neurology

## 2016-04-06 ENCOUNTER — Encounter: Payer: Self-pay | Admitting: Neurology

## 2016-04-06 VITALS — BP 118/68 | HR 78 | Ht 64.25 in | Wt 173.2 lb

## 2016-04-06 DIAGNOSIS — R519 Headache, unspecified: Secondary | ICD-10-CM

## 2016-04-06 DIAGNOSIS — R51 Headache: Secondary | ICD-10-CM

## 2016-04-06 MED ORDER — TOPIRAMATE ER 50 MG PO CAP24: 50.0000 mg | ORAL_CAPSULE | Freq: Every day | ORAL | 11 refills | Status: DC

## 2016-04-06 MED ORDER — TOPIRAMATE ER 50 MG PO CAP24: 1.0000 | ORAL_CAPSULE | Freq: Every day | ORAL | 0 refills | Status: DC

## 2016-04-06 NOTE — Patient Instructions (Addendum)
Increase Trokendi to  daily  Return to clinic in 6 months

## 2016-04-06 NOTE — Progress Notes (Signed)
Follow-up Visit   Date: 04/06/16    Melissa Benson MRN: [id number] DOB: 06-01-1960   Interim History: Melissa Benson is a 56 y.o. ight-handed Caucasian female with diabetes mellitus. GERD, hypertension, hyperlipidemia, obesity s/p gastric bypass (01/2015 with >70lb weight loss) returning to the clinic for follow-up of chronic daily headaches.  The patient was accompanied to the clinic by self.  History of present illness: She reports having headaches since her late 30s, described as bifrontal and achy/throbbing pain.  She reports having nearly a daily (at least 5 times/week) headaches for 20 years which she would treat with ibuprofen.  She does recall having improvement with iburpofen.  She occasionally would get nausea, but no vomiting, photophobia, or phonophobia.    She underwent gastric bypass surgery in January 2017 and following this, her headaches worsened.  Her headaches became more intense and associated with vomiting. CT head was normal.  She reports having a daily headache since then and did not have relief with tylenol, but was taking this daily.  Headaches usually lasts about 12 hours.  Rest does not improve her pain. She was started on hydrocodone by her PCP which has helped.  Now, she has headaches 4-5 times per week which she continues to treat daily with hydrocodone. Pain is ranked 3/10.  She did notice some improvement since stopping caffeine.   She reports having dry eyes due to contact lens and was switched to a different lens time. She also complains of mid-back pain which started in May 2017. Pain is achy, throbbing, and nonradiating.      She woke up with left pinky numbness today.  She has previous cervical surgery 10 years ago.  She does not report weakness of her hands.   UPDATE 12/02/2015:  She is here for 4 month follow-up appointment.  At her last visit, I recommended that she start topiramate  for one month, then increase to , but she stopped  the medication after one month because it made her feel jittery.  Because of her liver enzymes being elevated, she had stopped taking tylenol and NSAIDs over the past few weeks and has not noticed any change in her headache frequency.  She continues to have bifrontal headaches daily, but does not take anything besides magnesium for this.  She also complains of mid-back pain at the level of the shoulder blades which is worse with activity and improved with rest.  Flexeril and stretching helps some.  She has noticed new bilateral leg cramps and still has burning sensation over the top of her foot. Her previous EMG from 2014 showed mild L5 radiculopathy.  She does not have radicular leg pain, weakness, or low back discomfort.   Her tingling of the left pinky has resolved.    UPDATE 04/06/2016:  Since starting Trokendi , she has noticed some improvement in the intensity of the headaches, but continues to have daily headaches.  She does not treat these headaches with NSAIDs or tylenol.  Pain is ranked as 3/10 where as previously it was 6/10.  She no longer has low back pain, cramps, or paresthesias of the hands and feet.  Overall, she is doing well with no new complaints.   Medications:  Current Outpatient Prescriptions on File Prior to Visit  Medication Sig Dispense Refill  . BAYER CONTOUR NEXT TEST test strip USE AS INSTRUCTED TO CHECK BLOOD SUGAR THREE TIMES DAILY 100 each prn  . Biotin 5 MG CAPS Take by mouth.    Marland Kitchen  calcium-vitamin D 250-100 MG-UNIT tablet Take 1 tablet by mouth daily.    . cyclobenzaprine (FLEXERIL) 5 MG tablet Take 1 tablet (5 mg total) by mouth 2 (two) times daily. 60 tablet 5  . MAGNESIUM PO Take 600 mg by mouth daily.    . metFORMIN (GLUCOPHAGE) 500 MG tablet TAKE 1 TABLET(500 MG) BY MOUTH TWICE DAILY WITH A MEAL 60 tablet 5  . Multiple Vitamin (MULTIVITAMIN) tablet Take 1 tablet by mouth daily.    Marland Kitchen PROCTOFOAM HC rectal foam APPLY RECTALLY TO THE ANUS TWICE DAILY 10 g 0  .  promethazine (PHENERGAN) 25 MG tablet TAKE 1 TABLET(25 MG) BY MOUTH EVERY 4 HOURS AS NEEDED 30 tablet 1  . ramipril (ALTACE) 5 MG capsule TAKE 1 CAPSULE(5 MG) BY MOUTH DAILY 90 capsule 3  . rosuvastatin (CRESTOR) 5 MG tablet TAKE 1 TABLET(5 MG) BY MOUTH DAILY 30 tablet 11  . Topiramate ER (TROKENDI XR) 25 MG CP24 Take 25 mg by mouth daily. 30 capsule 5  . vitamin B-12 (CYANOCOBALAMIN) 1000 MCG tablet Take 1,000 mcg by mouth daily.     No current facility-administered medications on file prior to visit.     Allergies:  Allergies  Allergen Reactions  . Morphine And Related Nausea And Vomiting    Pt states also caused arm to turn black where medication was administered   . Pantoprazole Hives    Review of Systems:  CONSTITUTIONAL: No fevers, chills, night sweats, or weight loss.  EYES: No visual changes or eye pain ENT: No hearing changes.  No history of nose bleeds.   RESPIRATORY: No cough, wheezing and shortness of breath.   CARDIOVASCULAR: Negative for chest pain, and palpitations.   GI: Negative for abdominal discomfort, blood in stools or black stools.  No recent change in bowel habits.   GU:  No history of incontinence.   MUSCLOSKELETAL: No history of joint pain or swelling.  No myalgias.   SKIN: Negative for lesions, rash, and itching.   ENDOCRINE: Negative for cold or heat intolerance, polydipsia or goiter.   PSYCH:  No depression or anxiety symptoms.   NEURO: As Above.   Vital Signs:  BP 118/68   Pulse 78   Ht 5' 4.25" (1.632 m)   Wt 173 lb 4 oz (78.6 kg)   SpO2 99%   BMI 29.50 kg/m   Neurological Exam: MENTAL STATUS including orientation to time, place, person, recent and remote memory, attention span and concentration, language, and fund of knowledge is normal.  Speech is not dysarthric.  CRANIAL NERVES:  Pupils round and reactive to light.  Extraocular muscles intact. Face is symmetric.   MOTOR:  Motor strength is 5/5 in all extremities.    COORDINATION/GAIT:    Gait narrow based and stable.   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 radiculopathy) affecting the right L5 nerve/segment.   There is no evidence of a cervical motor radiculopathy, median neuropathy at the wrist, or large fiber generalized sensorimotor polyneuropathy affecting the right side. However, a pure small fiber sensory polyneuropathy cannot be excluded based on this study.  Labs 2017:  HbA1c 6.4, TSH 0.88 Labs 2014:  508   IMPRESSION/PLAN: 1.  Chronic daily headaches. Intensity of headaches has improved since starting Trokendi, but frequency is unchanged.  She is on a low dose of Trokendi , so there is room to titrate this  further.  Fortunately she is not having side effects as she was with topiramate (jitteriness), so will increase her dose to Trokendi  daily (samples provided).  She also takes magnesium  for constipation and feels that this also helping headaches.  2.  Mid-back muscular pain, cramps - improved              - Continue flexeril to  BID prn  3.  Paresthesias of left fifth digit ?ulnar neuropathy vs C8 radiculopathy - resolved  4.  Paresthesias over the dorsum of the feet bilaterally, previous EMG shows right L5 radiculopathy which is consistent with her symptoms.  There is no weakness or radicular pain, so will continue to follow clinically.    Return to clinic in 4 months.   The duration of this appointment visit was 25 minutes of face-to-face time with the patient.  Greater than 50% of this time was spent in counseling, explanation of diagnosis, planning of further management, and coordination of care.   Thank you for allowing me to participate in patient's care.  If I can answer any additional questions, I would be pleased to do so.    Sincerely,    Donika K. Allena Katz, DO

## 2016-04-13 ENCOUNTER — Other Ambulatory Visit: Payer: Self-pay | Admitting: Internal Medicine

## 2016-05-18 ENCOUNTER — Other Ambulatory Visit: Payer: Self-pay | Admitting: Internal Medicine

## 2016-06-17 IMAGING — NM NM MISC PROCEDURE
6 series · 36 of 36 positions shown · non-contrast
Comparison: none

[Series 1: stress-sum-em · 6.40mm/px · 6 of 64 frames shown]
[frame 6/64]
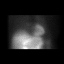
[frame 16/64]
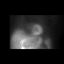
[frame 27/64]
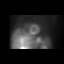
[frame 38/64]
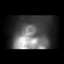
[frame 48/64]
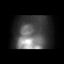
[frame 59/64]
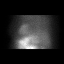

[Series 1: wbr_r-proj_st rest · 6.40mm/px · 6 of 64 frames shown]
[frame 6/64]
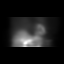
[frame 16/64]
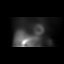
[frame 27/64]
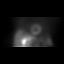
[frame 38/64]
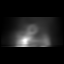
[frame 48/64]
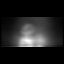
[frame 59/64]
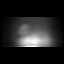

[Series 1: wbr_s-proj_st stress-gsp · 6.40mm/px · 6 of 512 frames shown]
[frame 43/512]
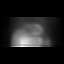
[frame 128/512]
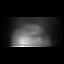
[frame 214/512]
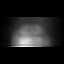
[frame 299/512]
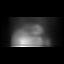
[frame 384/512]
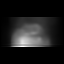
[frame 470/512]
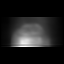

[Series 1: wbr_s-proj_st stress-sum-em · 6.40mm/px · 6 of 64 frames shown]
[frame 6/64]
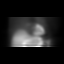
[frame 16/64]
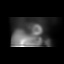
[frame 27/64]
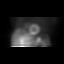
[frame 38/64]
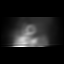
[frame 48/64]
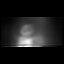
[frame 59/64]
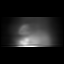

[Series 1: stress-gsp · 6.40mm/px · 6 of 512 frames shown]
[frame 43/512  full-range]
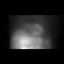
[frame 128/512  full-range]
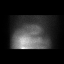
[frame 214/512  full-range]
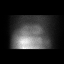
[frame 299/512  full-range]
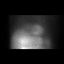
[frame 384/512  full-range]
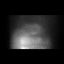
[frame 470/512  full-range]
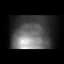

[Series 1: rest · 6.40mm/px · 6 of 64 frames shown]
[frame 6/64]
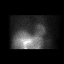
[frame 16/64]
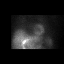
[frame 27/64]
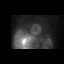
[frame 38/64]
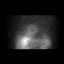
[frame 48/64]
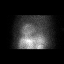
[frame 59/64]
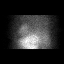

[36 of 36 positions shown; findings below may reference images not displayed]

Canned report from images found in remote index.

Refer to host system for actual result text.

## 2016-06-19 ENCOUNTER — Other Ambulatory Visit: Payer: Self-pay | Admitting: Neurology

## 2016-06-20 ENCOUNTER — Other Ambulatory Visit: Payer: Self-pay | Admitting: *Deleted

## 2016-06-20 MED ORDER — CYCLOBENZAPRINE HCL 5 MG PO TABS: 5.0000 mg | ORAL_TABLET | Freq: Two times a day (BID) | ORAL | 5 refills | Status: DC

## 2016-06-23 ENCOUNTER — Encounter: Payer: Self-pay | Admitting: Physician Assistant

## 2016-06-23 ENCOUNTER — Telehealth: Payer: Self-pay | Admitting: Internal Medicine

## 2016-06-23 ENCOUNTER — Ambulatory Visit (INDEPENDENT_AMBULATORY_CARE_PROVIDER_SITE_OTHER): Payer: BLUE CROSS/BLUE SHIELD | Admitting: Physician Assistant

## 2016-06-23 VITALS — BP 110/68 | HR 89 | Temp 98.5°F | Resp 18 | Ht 64.61 in | Wt 168.8 lb

## 2016-06-23 DIAGNOSIS — J029 Acute pharyngitis, unspecified: Secondary | ICD-10-CM | POA: Diagnosis not present

## 2016-06-23 LAB — POCT RAPID STREP A (OFFICE): Rapid Strep A Screen: NEGATIVE

## 2016-06-23 MED ORDER — LIDOCAINE VISCOUS 2 % MT SOLN: 5.0000 mL | OROMUCOSAL | 0 refills | Status: DC | PRN

## 2016-06-23 MED ORDER — AMOXICILLIN 875 MG PO TABS: 875.0000 mg | ORAL_TABLET | Freq: Two times a day (BID) | ORAL | 0 refills | Status: AC

## 2016-06-23 NOTE — Patient Instructions (Addendum)
Try claritin.  Will call you if your strep is positive.  If you don't hear anything then you test is negative.     IF you received an x-ray today, you will receive an invoice from [facility]. Please contact [facility] at [phone] with questions or concerns regarding your invoice.   IF you received labwork today, you will receive an invoice from [facility]. Please contact [facility] at [phone] with questions or concerns regarding your invoice.   Our billing staff will not be able to assist you with questions regarding bills from these companies.  You will be contacted with the lab results as soon as they are available. The fastest way to get your results is to activate your My Chart account. Instructions are located on the last page of this paperwork. If you have not heard from us regarding the results in 2 weeks, please contact this office.

## 2016-06-23 NOTE — Progress Notes (Signed)
06/23/2016 2:09 PM   DOB: Apr 19, 1960 / MRN: [id number]  SUBJECTIVE:  Melissa Benson is a 56 y.o. female presenting for sore throat that started about 1 week ago.  She associates dry cough.  Feels that she is not getting better. Tells me that she has had some "stuff in her throat."  She has tried tylenol and lozenges.  Denies fever, chills, rash.  No new sneezing.  Eyes and ear have been itching. She has a history of well controlled diabetes.   She is allergic to morphine and related and pantoprazole.   She  has a past medical history of Complication of anesthesia; Diabetes mellitus; GERD (gastroesophageal reflux disease); Headache; History of hiatal hernia; History of migraine headaches; History of urinary tract infection; History of vertigo; Hyperlipidemia; Hypertension; Neck pain, chronic; OSA (obstructive sleep apnea); PONV (postoperative nausea and vomiting); Psoriasis; Rosacea; and Weak urinary stream.    She  reports that she quit smoking about 33 years ago. Her smoking use included Cigarettes. She has a 20.00 pack-year smoking history. She has never used smokeless tobacco. She reports that she does not drink alcohol or use drugs. She  reports that she does not engage in sexual activity. The patient  has a past surgical history that includes Cholecystectomy; Appendectomy; Tubal ligation; Abdominal hysterectomy; Spine surgery; Spine surgery; Cardiovascular stress test (07/20/2011); 2D ECHOCARDIOGRAM (07/20/2011); Dilation and curettage of uterus; colonscopy ; Diagnostic laparoscopy; and Laparoscopic roux-en-y gastric bypass with hiatal hernia repair (N/A, 01/12/2015).  Her family history includes Diabetes in her father, maternal grandfather, and mother; Heart attack in her maternal grandmother; Heart disease in her father, maternal grandfather, maternal grandmother, and mother; Hyperlipidemia in her brother; Hypertension in her brother, father, maternal grandfather, mother, and sister; Lung  disease in her father.  Review of Systems  Constitutional: Negative for chills, diaphoresis and fever.  Eyes: Negative.   Respiratory: Negative for cough, hemoptysis, sputum production, shortness of breath and wheezing.   Cardiovascular: Negative for chest pain, orthopnea and leg swelling.  Gastrointestinal: Negative for nausea.  Skin: Negative for rash.  Neurological: Negative for dizziness, sensory change, speech change, focal weakness and headaches.    The problem list and medications were reviewed and updated by myself where necessary and exist elsewhere in the encounter.   OBJECTIVE:  BP 110/68 (BP Location: Right Arm, Patient Position: Sitting, Cuff Size: Normal)   Pulse 89   Temp 98.5 F (36.9 C) (Oral)   Resp 18   Ht 5' 4.61" (1.641 m)   Wt 168 lb 12.8 oz (76.6 kg)   SpO2 98%   BMI 28.43 kg/m   Physical Exam  Constitutional: She appears well-developed and well-nourished.  HENT:  Right Ear: Tympanic membrane normal.  Left Ear: Tympanic membrane normal.  Nose: Nose normal.  Mouth/Throat: Uvula is midline, oropharynx is clear and moist and mucous membranes are normal.    Cardiovascular: Normal rate and regular rhythm.   Pulmonary/Chest: Effort normal and breath sounds normal.    Lab Results  Component Value Date   HGBA1C 6.4 (H) 05/26/2015     No results found for this or any previous visit (from the past 72 hour(s)).  No results found.  ASSESSMENT AND PLAN:  Melissa Benson was seen today for cough, sore throat and ear fullness.  Diagnoses and all orders for this visit:  Sore throat: Likely allergic in nature given HPI.  She will try to hold abx until testing confirms.  She may fill sooner if fever greater tan 100.4.   -  Culture, Group A Strep -     POCT rapid strep A -     amoxicillin (AMOXIL) 875 MG tablet; Take 1 tablet (875 mg total) by mouth 2 (two) times daily. Do not fill unless fever of 100.4 or greater. -     lidocaine (XYLOCAINE) 2 % solution; Use  as directed 5-10 mLs in the mouth or throat as needed for mouth pain.    The patient is advised to call or return to clinic if she does not see an improvement in symptoms, or to seek the care of the closest emergency department if she worsens with the above plan.   Deliah Boston, MHS, PA-C Primary [facility] at [facility] 06/23/2016 2:09 PM

## 2016-06-23 NOTE — Telephone Encounter (Signed)
Called wanting to be seen for sore throat for over a week.  Advised Dr. Lenord FellersBaxley out of the office until Monday.  States she will go to an Urgent Care.

## 2016-06-26 LAB — CULTURE, GROUP A STREP: Strep A Culture: NEGATIVE

## 2016-07-13 ENCOUNTER — Encounter: Payer: Self-pay | Admitting: Internal Medicine

## 2016-07-13 DIAGNOSIS — E119 Type 2 diabetes mellitus without complications: Secondary | ICD-10-CM | POA: Diagnosis not present

## 2016-07-13 DIAGNOSIS — H40012 Open angle with borderline findings, low risk, left eye: Secondary | ICD-10-CM | POA: Diagnosis not present

## 2016-07-13 LAB — HM DIABETES EYE EXAM

## 2016-07-14 ENCOUNTER — Other Ambulatory Visit: Payer: BLUE CROSS/BLUE SHIELD | Admitting: Internal Medicine

## 2016-07-14 DIAGNOSIS — E119 Type 2 diabetes mellitus without complications: Secondary | ICD-10-CM | POA: Diagnosis not present

## 2016-07-14 DIAGNOSIS — I10 Essential (primary) hypertension: Secondary | ICD-10-CM | POA: Diagnosis not present

## 2016-07-14 DIAGNOSIS — E785 Hyperlipidemia, unspecified: Secondary | ICD-10-CM | POA: Diagnosis not present

## 2016-07-14 DIAGNOSIS — E669 Obesity, unspecified: Secondary | ICD-10-CM | POA: Diagnosis not present

## 2016-07-14 LAB — LIPID PANEL
Cholesterol: 109 mg/dL (ref <200)
HDL: 61 mg/dL (ref >50)
LDL CALC: 35 mg/dL (ref <100)
Total CHOL/HDL Ratio: 1.8 Ratio (ref <5.0)
Triglycerides: 67 mg/dL (ref <150)
VLDL: 13 mg/dL (ref <30)

## 2016-07-14 LAB — HEPATIC FUNCTION PANEL
ALBUMIN: 4.2 g/dL (ref 3.6–5.1)
ALK PHOS: 71 U/L (ref 33–130)
ALT: 35 U/L — ABNORMAL HIGH (ref 6–29)
AST: 31 U/L (ref 10–35)
BILIRUBIN INDIRECT: 0.3 mg/dL (ref 0.2–1.2)
BILIRUBIN TOTAL: 0.4 mg/dL (ref 0.2–1.2)
Bilirubin, Direct: 0.1 mg/dL (ref <=0.2)
TOTAL PROTEIN: 6.5 g/dL (ref 6.1–8.1)

## 2016-07-15 LAB — MICROALBUMIN / CREATININE URINE RATIO
Creatinine, Urine: 137 mg/dL (ref 20–320)
MICROALB/CREAT RATIO: 7 ug/mg{creat} (ref <30)
Microalb, Ur: 0.9 mg/dL

## 2016-07-15 LAB — HEMOGLOBIN A1C
Hgb A1c MFr Bld: 6.1 % — ABNORMAL HIGH
Mean Plasma Glucose: 128 mg/dL

## 2016-07-18 ENCOUNTER — Ambulatory Visit (INDEPENDENT_AMBULATORY_CARE_PROVIDER_SITE_OTHER): Payer: BLUE CROSS/BLUE SHIELD | Admitting: Internal Medicine

## 2016-07-18 ENCOUNTER — Encounter: Payer: Self-pay | Admitting: Internal Medicine

## 2016-07-18 VITALS — BP 120/80 | HR 81 | Temp 99.1°F | Wt 171.0 lb

## 2016-07-18 DIAGNOSIS — R519 Headache, unspecified: Secondary | ICD-10-CM

## 2016-07-18 DIAGNOSIS — R51 Headache: Secondary | ICD-10-CM

## 2016-07-18 DIAGNOSIS — Z6828 Body mass index (BMI) 28.0-28.9, adult: Secondary | ICD-10-CM

## 2016-07-18 DIAGNOSIS — I10 Essential (primary) hypertension: Secondary | ICD-10-CM

## 2016-07-18 DIAGNOSIS — E8881 Metabolic syndrome: Secondary | ICD-10-CM

## 2016-07-18 DIAGNOSIS — K760 Fatty (change of) liver, not elsewhere classified: Secondary | ICD-10-CM

## 2016-07-18 DIAGNOSIS — E7800 Pure hypercholesterolemia, unspecified: Secondary | ICD-10-CM | POA: Diagnosis not present

## 2016-07-18 DIAGNOSIS — N390 Urinary tract infection, site not specified: Secondary | ICD-10-CM

## 2016-07-18 DIAGNOSIS — Z9884 Bariatric surgery status: Secondary | ICD-10-CM | POA: Diagnosis not present

## 2016-07-18 DIAGNOSIS — E119 Type 2 diabetes mellitus without complications: Secondary | ICD-10-CM | POA: Diagnosis not present

## 2016-07-18 LAB — POCT URINALYSIS DIPSTICK
Bilirubin, UA: NEGATIVE
Blood, UA: NEGATIVE
GLUCOSE UA: NEGATIVE
Ketones, UA: NEGATIVE
Leukocytes, UA: NEGATIVE
NITRITE UA: NEGATIVE
Protein, UA: NEGATIVE
SPEC GRAV UA: 1.015 (ref 1.010–1.025)
UROBILINOGEN UA: 0.2 U/dL
pH, UA: 8 (ref 5.0–8.0)

## 2016-07-18 MED ORDER — DOXYCYCLINE HYCLATE 100 MG PO TABS: 100.0000 mg | ORAL_TABLET | Freq: Two times a day (BID) | ORAL | 0 refills | Status: DC

## 2016-07-18 MED ORDER — MUPIROCIN CALCIUM 2 % NA OINT: TOPICAL_OINTMENT | NASAL | 1 refills | Status: DC

## 2016-07-18 MED ORDER — MUPIROCIN CALCIUM 2 % NA OINT: 1.0000 "application " | TOPICAL_OINTMENT | Freq: Two times a day (BID) | NASAL | 1 refills | Status: DC

## 2016-07-18 NOTE — Progress Notes (Signed)
   Subjective:    Patient ID: Melissa Benson, female    DOB: 04/30/1960, 56 y.o.   MRN: [id number]  HPI   In today for 6 month recheck. Hgb AIC has improved  New problems today include sore in right nostril and UTI symptoms.  Still takes Phenergan frequently for nausea. Not sure what causes her nausea but she thinks it may be hypoglycemia. Doesn't usually take it more than once a day but may take it several times a week.  Headaches have improved.  Magnesium has helped her headaches.  Hx fatty liver. Liver panel is normal.  Lipid panel is normal.  She is status post gastric bypass surgery. History of diabetes, GE reflux, hyperlipidemia, essential hypertension. History of sleep apnea, obesity, depression. History of recurrent urinary infections.  In 2006 she weighed 251 pounds and in 2009 she weighed 260 pounds. In 2009 and her hemoglobin A1c was 9.1%. In 2010 she was started on insulin. Her weight today is 171 pounds. Her blood pressure is excellent at 120/80. Her BMI is 28.80      Review of Systems     Objective:   Physical Exam Small carbuncle inside tip of right nostril. Neck is supple without JVD, thyromegaly, or carotid bruits. Chest; clear to auscultation. Cardiac exam: regular rate and rhythm normal S1 and S2. Extremities without edema. Urine specimen today is entirely within normal limits.       Assessment & Plan:  Dysuria-dipstick UA normal  Carbuncle in right nostril-treat with Bactroban ointment twice daily for 7 days. Doxycycline 100 mg twice daily for 7 days.  Status post gastric bypass surgery-weight 171 pounds and stable  Metabolic syndrome  Hyperlipidemia-lipid panel is normal  History of fatty liver with elevated liver enzymes-SGPT slightly elevated at 35 and previously was 28 7 months ago. Stable and will follow.  Diabetes mellitus-hemoglobin A1c 6.1% and a year ago was 6.4%  History of chronic daily headaches-improved with magnesium. Not using as  much analgesic medication as she was previously.  Frequent episodes of nausea treated with Phenergan. She thinks it's related to hypoglycemia. Generally can take one Phenergan with relief.  Plan: Return after November 9 for physical examination. Continue same medications.

## 2016-07-18 NOTE — Patient Instructions (Signed)
Bactroban ointment in nostrils twice a day for 10 days. Doxycycline 100 mg twice daily for 7 days for UTI and carbuncle and right nostril. Continue other medications as previously prescribed and return late November for physical examination.

## 2016-07-19 LAB — URINE CULTURE: ORGANISM ID, BACTERIA: NO GROWTH

## 2016-08-10 ENCOUNTER — Encounter (HOSPITAL_COMMUNITY): Payer: Self-pay

## 2016-09-23 ENCOUNTER — Other Ambulatory Visit: Payer: Self-pay | Admitting: Internal Medicine

## 2016-10-05 ENCOUNTER — Ambulatory Visit (INDEPENDENT_AMBULATORY_CARE_PROVIDER_SITE_OTHER): Payer: BLUE CROSS/BLUE SHIELD | Admitting: Neurology

## 2016-10-05 ENCOUNTER — Encounter: Payer: Self-pay | Admitting: Neurology

## 2016-10-05 VITALS — BP 120/80 | HR 88 | Ht 64.0 in | Wt 175.4 lb

## 2016-10-05 DIAGNOSIS — M5416 Radiculopathy, lumbar region: Secondary | ICD-10-CM

## 2016-10-05 DIAGNOSIS — R51 Headache: Secondary | ICD-10-CM | POA: Diagnosis not present

## 2016-10-05 DIAGNOSIS — R519 Headache, unspecified: Secondary | ICD-10-CM

## 2016-10-05 DIAGNOSIS — Z9884 Bariatric surgery status: Secondary | ICD-10-CM | POA: Diagnosis not present

## 2016-10-05 NOTE — Patient Instructions (Addendum)
Continue Trokendi  daily  Return to clinic in April 2018

## 2016-10-05 NOTE — Progress Notes (Signed)
Follow-up Visit   Date: 10/05/16    Melissa Benson MRN: [id number] DOB: 1960-02-02   Interim History: Melissa Benson is a 56 y.o. ight-handed Caucasian female with diabetes mellitus. GERD, hypertension, hyperlipidemia, obesity s/p gastric bypass (01/2015 with >70lb weight loss) returning to the clinic for follow-up of chronic daily headaches and low back pain.  The patient was accompanied to the clinic by self.  History of present illness: She reports having headaches since her late 30s, described as bifrontal and achy/throbbing pain.  She reports having nearly a daily (at least 5 times/week) headaches for 20 years which she would treat with ibuprofen.  She does recall having improvement with iburpofen.  She occasionally would get nausea, but no vomiting, photophobia, or phonophobia.    She underwent gastric bypass surgery in January 2017 and following this, her headaches worsened.  Her headaches became more intense and associated with vomiting. CT head was normal.  She reports having a daily headache since then and did not have relief with tylenol, but was taking this daily.  Headaches usually lasts about 12 hours.  Rest does not improve her pain. She was started on hydrocodone by her PCP which has helped.  Now, she has headaches 4-5 times per week which she continues to treat daily with hydrocodone. Pain is ranked 3/10.  She did notice some improvement since stopping caffeine.   She reports having dry eyes due to contact lens and was switched to a different lens time. She also complains of mid-back pain which started in May 2017. Pain is achy, throbbing, and nonradiating.      UPDATE 12/02/2015:  She is here for 4 month follow-up appointment.  At her last visit, I recommended that she start topiramate  for one month, then increase to , but she stopped the medication after one month because it made her feel jittery.  Because of her liver enzymes being elevated, she had  stopped taking tylenol and NSAIDs over the past few weeks and has not noticed any change in her headache frequency.  She continues to have bifrontal headaches daily, but does not take anything besides magnesium for this.  She also complains of mid-back pain at the level of the shoulder blades which is worse with activity and improved with rest.  Flexeril and stretching helps some.  She has noticed new bilateral leg cramps and still has burning sensation over the top of her foot. Her previous EMG from 2014 showed mild L5 radiculopathy.  She does not have radicular leg pain, weakness, or low back discomfort.   UPDATE 04/06/2016:  Since starting Trokendi , she has noticed some improvement in the intensity of the headaches, but continues to have daily headaches.  She does not treat these headaches with NSAIDs or tylenol.  Pain is ranked as 3/10 where as previously it was 6/10.    UPDATE 10/05/2016:  Since increasing her Trokendi  her headaches completely resolved.  However, over the past month she has started having dull achy headaches, which she feels is more stress-induced.  Her mother has Alzheimer's dementia and there are family matters which is complicating her care, which is stressful.   She gets these about once per week and does not always have to treat them.  Sometime, she continues to have right sided low back pain which radiates into her leg, but this responds well to NSAIDs. Prickly sensation over the tops of the feet are unchanged. Overall, she has been doing well with no  illnesses, hospitalizations, or falls.    Medications:  Current Outpatient Prescriptions on File Prior to Visit  Medication Sig Dispense Refill  . BAYER CONTOUR NEXT TEST test strip USE AS INSTRUCTED TO CHECK BLOOD SUGAR THREE TIMES DAILY 100 each prn  . Biotin 5 MG CAPS Take by mouth.    . bisacodyl (DULCOLAX) 5 MG EC tablet bisacodyl 5 mg tablet,delayed release  TAKE AS DIRECTED    . calcium-vitamin D 250-100 MG-UNIT  tablet Take 1 tablet by mouth daily.    . cyclobenzaprine (FLEXERIL) 5 MG tablet Take 1 tablet (5 mg total) by mouth 2 (two) times daily. 60 tablet 5  . doxycycline (VIBRA-TABS) 100 MG tablet Take 1 tablet (100 mg total) by mouth 2 (two) times daily. 14 tablet 0  . MAGNESIUM PO Take 600 mg by mouth daily.    . metFORMIN (GLUCOPHAGE) 500 MG tablet TAKE 1 TABLET(500 MG) BY MOUTH TWICE DAILY WITH A MEAL 189 tablet 1  . Multiple Vitamin (MULTIVITAMIN) tablet Take 1 tablet by mouth daily.    . mupirocin nasal ointment (BACTROBAN NASAL) 2 % Apply to each nostril two times daily. 10 g 1  . PROCTOFOAM HC rectal foam APPLY RECTALLY TO THE ANUS TWICE DAILY 10 g 0  . promethazine (PHENERGAN) 25 MG tablet TAKE 1 TABLET(25 MG) BY MOUTH EVERY 4 HOURS AS NEEDED 30 tablet 2  . ramipril (ALTACE) 5 MG capsule TAKE 1 CAPSULE(5 MG) BY MOUTH DAILY 90 capsule 3  . rosuvastatin (CRESTOR) 5 MG tablet TAKE 1 TABLET(5 MG) BY MOUTH DAILY 30 tablet 11  . Topiramate ER (TROKENDI XR) 50 MG CP24 Take 50 mg by mouth daily. 30 capsule 11  . vitamin B-12 (CYANOCOBALAMIN) 1000 MCG tablet Take 1,000 mcg by mouth daily.     No current facility-administered medications on file prior to visit.     Allergies:  Allergies  Allergen Reactions  . Morphine And Related Nausea And Vomiting    Pt states also caused arm to turn black where medication was administered   . Pantoprazole Hives  . Tyloxapol     Review of Systems:  CONSTITUTIONAL: No fevers, chills, night sweats, or weight loss.  EYES: No visual changes or eye pain ENT: No hearing changes.  No history of nose bleeds.   RESPIRATORY: No cough, wheezing and shortness of breath.   CARDIOVASCULAR: Negative for chest pain, and palpitations.   GI: Negative for abdominal discomfort, blood in stools or black stools.  No recent change in bowel habits.   GU:  No history of incontinence.   MUSCLOSKELETAL: No history of joint pain or swelling.  No myalgias.   SKIN: Negative for  lesions, rash, and itching.   ENDOCRINE: Negative for cold or heat intolerance, polydipsia or goiter.   PSYCH:  No depression or anxiety symptoms.   NEURO: As Above.   Vital Signs:  BP 120/80   Pulse 88   Ht  (1.626 m)   Wt 175 lb 7 oz (79.6 kg)   SpO2 98%   BMI 30.11 kg/m   Neurological Exam: MENTAL STATUS including orientation to time, place, person, recent and remote memory, attention span and concentration, language, and fund of knowledge is normal.  Speech is not dysarthric.  CRANIAL NERVES:  Pupils equal round and reactive to light.  Normal conjugate, extra-ocular eye movements in all directions of gaze.  No ptosis.  Face is symmetric.  MOTOR:  Motor strength is 5/5 in all extremities.  MSRs:  Reflexes are 2+/4  throughout, except brisk 3+/4 patella jerks.  SENSORY:  Intact to vibration throughout.  COORDINATION/GAIT:  Gait narrow based and stable.    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 radiculopathy) affecting the right L5 nerve/segment.   There is no evidence of a cervical motor radiculopathy, median neuropathy at the wrist, or large fiber generalized sensorimotor polyneuropathy affecting the right side. However, a pure small fiber sensory polyneuropathy cannot be excluded based on this study.  Labs 2017:  HbA1c 6.4, TSH 0.88 Labs 2014:  508   IMPRESSION/PLAN: 1.  Chronic daily headaches, significantly improved on Trokendi  daily and does not have severe migraines.  2.  Dull tension headaches are stress-induced.  These are not severe enough to treat pharmacologically and occur about once per week.  3.  Intermittent right radicular leg pain and paresthesias over the dorsum of the feet is most likely due to L5 radiculopathy.  Currently, she denies back pain.  If symptoms get worse, proceed with physical therapy  for low back strengthening.  Continue flexeril  BID as needed  Return to clinic in 6 months.   Thank you for allowing me to participate in patient's care.  If I can answer any additional questions, I would be pleased to do so.    Sincerely,    Donika K. Allena Katz, DO

## 2016-10-28 ENCOUNTER — Other Ambulatory Visit: Payer: Self-pay | Admitting: Internal Medicine

## 2016-11-10 ENCOUNTER — Other Ambulatory Visit: Payer: BLUE CROSS/BLUE SHIELD | Admitting: Internal Medicine

## 2016-11-10 DIAGNOSIS — E669 Obesity, unspecified: Secondary | ICD-10-CM | POA: Diagnosis not present

## 2016-11-10 DIAGNOSIS — E119 Type 2 diabetes mellitus without complications: Secondary | ICD-10-CM | POA: Diagnosis not present

## 2016-11-10 DIAGNOSIS — Z0000 Encounter for general adult medical examination without abnormal findings: Secondary | ICD-10-CM

## 2016-11-10 DIAGNOSIS — E785 Hyperlipidemia, unspecified: Secondary | ICD-10-CM | POA: Diagnosis not present

## 2016-11-10 DIAGNOSIS — I10 Essential (primary) hypertension: Secondary | ICD-10-CM

## 2016-11-11 LAB — CBC WITH DIFFERENTIAL/PLATELET
BASOS PCT: 0.2 %
Basophils Absolute: 9 cells/uL (ref 0–200)
Eosinophils Absolute: 41 cells/uL (ref 15–500)
Eosinophils Relative: 0.9 %
HEMATOCRIT: 35.3 % (ref 35.0–45.0)
HEMOGLOBIN: 11.8 g/dL (ref 11.7–15.5)
LYMPHS ABS: 1293 {cells}/uL (ref 850–3900)
MCH: 30.3 pg (ref 27.0–33.0)
MCHC: 33.4 g/dL (ref 32.0–36.0)
MCV: 90.5 fL (ref 80.0–100.0)
MPV: 9.7 fL (ref 7.5–12.5)
Monocytes Relative: 5.7 %
NEUTROS ABS: 2995 {cells}/uL (ref 1500–7800)
NEUTROS PCT: 65.1 %
Platelets: 250 10*3/uL (ref 140–400)
RBC: 3.9 10*6/uL (ref 3.80–5.10)
RDW: 12.6 % (ref 11.0–15.0)
Total Lymphocyte: 28.1 %
WBC: 4.6 10*3/uL (ref 3.8–10.8)
WBCMIX: 262 {cells}/uL (ref 200–950)

## 2016-11-11 LAB — COMPLETE METABOLIC PANEL WITH GFR
AG RATIO: 1.9 (calc) (ref 1.0–2.5)
ALBUMIN MSPROF: 4.3 g/dL (ref 3.6–5.1)
ALT: 25 U/L (ref 6–29)
AST: 24 U/L (ref 10–35)
Alkaline phosphatase (APISO): 64 U/L (ref 33–130)
BUN: 20 mg/dL (ref 7–25)
CALCIUM: 9.4 mg/dL (ref 8.6–10.4)
CO2: 28 mmol/L (ref 20–32)
CREATININE: 0.73 mg/dL (ref 0.50–1.05)
Chloride: 106 mmol/L (ref 98–110)
GFR, EST AFRICAN AMERICAN: 107 mL/min/{1.73_m2} (ref >=60)
GFR, EST NON AFRICAN AMERICAN: 92 mL/min/{1.73_m2} (ref >=60)
GLOBULIN: 2.3 g/dL (ref 1.9–3.7)
Glucose, Bld: 126 mg/dL — ABNORMAL HIGH (ref 65–99)
POTASSIUM: 4.2 mmol/L (ref 3.5–5.3)
SODIUM: 141 mmol/L (ref 135–146)
TOTAL PROTEIN: 6.6 g/dL (ref 6.1–8.1)
Total Bilirubin: 0.5 mg/dL (ref 0.2–1.2)

## 2016-11-11 LAB — TSH: TSH: 1.14 m[IU]/L (ref 0.40–4.50)

## 2016-11-11 LAB — MICROALBUMIN / CREATININE URINE RATIO
Creatinine, Urine: 32 mg/dL (ref 20–275)
Microalb, Ur: <0.2 mg/dL

## 2016-11-11 LAB — LIPID PANEL
CHOL/HDL RATIO: 1.6 (calc) (ref <5.0)
Cholesterol: 117 mg/dL (ref <200)
HDL: 74 mg/dL (ref >50)
LDL Cholesterol (Calc): 30 mg/dL (calc)
NON-HDL CHOLESTEROL (CALC): 43 mg/dL (ref <130)
Triglycerides: 54 mg/dL (ref <150)

## 2016-11-11 LAB — HEMOGLOBIN A1C
EAG (MMOL/L): 7 (calc)
Hgb A1c MFr Bld: 6 % of total Hgb — ABNORMAL HIGH (ref <5.7)
Mean Plasma Glucose: 126 (calc)

## 2016-11-15 ENCOUNTER — Encounter: Payer: Self-pay | Admitting: Internal Medicine

## 2016-11-15 ENCOUNTER — Ambulatory Visit: Payer: BLUE CROSS/BLUE SHIELD | Admitting: Internal Medicine

## 2016-11-15 VITALS — BP 128/80 | HR 88 | Temp 98.6°F | Ht 64.5 in | Wt 176.0 lb

## 2016-11-15 DIAGNOSIS — K760 Fatty (change of) liver, not elsewhere classified: Secondary | ICD-10-CM

## 2016-11-15 DIAGNOSIS — G8929 Other chronic pain: Secondary | ICD-10-CM

## 2016-11-15 DIAGNOSIS — I10 Essential (primary) hypertension: Secondary | ICD-10-CM

## 2016-11-15 DIAGNOSIS — M542 Cervicalgia: Secondary | ICD-10-CM

## 2016-11-15 DIAGNOSIS — M5417 Radiculopathy, lumbosacral region: Secondary | ICD-10-CM | POA: Diagnosis not present

## 2016-11-15 DIAGNOSIS — Z9884 Bariatric surgery status: Secondary | ICD-10-CM | POA: Diagnosis not present

## 2016-11-15 DIAGNOSIS — F411 Generalized anxiety disorder: Secondary | ICD-10-CM | POA: Diagnosis not present

## 2016-11-15 DIAGNOSIS — Z0000 Encounter for general adult medical examination without abnormal findings: Secondary | ICD-10-CM

## 2016-11-15 DIAGNOSIS — E8881 Metabolic syndrome: Secondary | ICD-10-CM | POA: Diagnosis not present

## 2016-11-15 DIAGNOSIS — Z23 Encounter for immunization: Secondary | ICD-10-CM | POA: Diagnosis not present

## 2016-11-15 DIAGNOSIS — R51 Headache: Secondary | ICD-10-CM

## 2016-11-15 DIAGNOSIS — Z6828 Body mass index (BMI) 28.0-28.9, adult: Secondary | ICD-10-CM | POA: Diagnosis not present

## 2016-11-15 DIAGNOSIS — Z0182 Encounter for allergy testing: Secondary | ICD-10-CM

## 2016-11-15 DIAGNOSIS — R519 Headache, unspecified: Secondary | ICD-10-CM

## 2016-11-15 DIAGNOSIS — Z87440 Personal history of urinary (tract) infections: Secondary | ICD-10-CM | POA: Diagnosis not present

## 2016-11-15 DIAGNOSIS — E119 Type 2 diabetes mellitus without complications: Secondary | ICD-10-CM | POA: Diagnosis not present

## 2016-11-15 LAB — POCT URINALYSIS DIPSTICK
Bilirubin, UA: NEGATIVE
Blood, UA: NEGATIVE
GLUCOSE UA: NEGATIVE
Ketones, UA: NEGATIVE
LEUKOCYTES UA: NEGATIVE
Nitrite, UA: NEGATIVE
PROTEIN UA: NEGATIVE
Spec Grav, UA: 1.015 (ref 1.010–1.025)
UROBILINOGEN UA: 0.2 U/dL
pH, UA: 6.5 (ref 5.0–8.0)

## 2016-11-15 NOTE — Progress Notes (Signed)
Subjective:    Patient ID: Melissa Benson, female    DOB: 01-04-1961, 56 y.o.   MRN: [id number]  HPI 56 year old Female for health maintenance exam and  evaluation of medical issues.Hx diabetes mellitus, hyperlipidemia, OSA,HTN,hx migraine headaches well controlled, paresthesias in feet secondary to lumbar disc disease. 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  Neurologist sees her for headaches.  Hx urinary infections.Hx depression. Hx situational stress. History of chronic neck pain and depression.  Social history: She is married and works as a Secretary/administrator.  She has 2 sons.  Colonoscopy done by Dr. Watt Climes September 2014 showing no adenomatous polyps.  Family history: Father with history of diabetes mellitus.  Mother with history of heart disease and Alzheimer's disease.  3 brothers all of them have substance abuse problems.  2 sisters 1 of whom is a diabetic who has had gastric sleeve surgery and history of breast cancer.  One brother has history of stroke perhaps related to substance abuse.  She sees Dr. Dwyane Dee for diabetic treatment.  Gastric bypass surgery done January 2017.  Reports having headaches as her late 98s.  Reports nearly daily headache at least 5 times a week for some 20 years but that has improved maybe to 2-3 times a week.  CT of the head in 2017 was normal.  Was placed on Topamax stop the medication because she thought it made her feel jittery.  History of elevated liver enzymes.  We advised stop taking Tylenol and NSAIDs.  Continue to have headaches despite stopping analgesic medications.  Has been mid back pain around the level of her shoulder blades treated with Flexeril.  EMG in 2014 showed mild L5 radiculopathy.  In October 2017 she had EMG of the right upper and lower extremity.  These findings were consistent with a very mild old intraspinal canal lesion consistent with radiculopathy affecting the right L5 nerve segment.  She is been a patient here since 2006.   At that time she weighed 251 pounds.  She weighed 260 pounds in 2009.  Globin A1c was 9.1% in 2009.  In 2010 she was started on insulin.  In 2014 she had lost down to 223 pounds.  In 2017 weight 173 pounds.  In September 2017 had an E. coli UTI.  January 2017 she was found to have T wave inversions on EKG with family history of coronary artery disease in her mother.  She was sent to a cardiologist.  Apparently had a nuclear stress test and echocardiogram at [facility] in 2013.  She had normal nuclear medicine study February 2017 without.  She also had 2D echocardiogram at that time showing mild LVH.     Review of Systems  Constitutional: Negative.   Respiratory: Negative.   Cardiovascular: Negative.   Musculoskeletal: Positive for arthralgias and myalgias.  Neurological: Positive for headaches.       Objective:   Physical Exam  Constitutional: She appears well-developed and well-nourished. No distress.  HENT:  Head: Normocephalic and atraumatic.  Right Ear: External ear normal.  Left Ear: External ear normal.  Mouth/Throat: Oropharynx is clear and moist. No oropharyngeal exudate.  Eyes: Conjunctivae and EOM are normal. Pupils are equal, round, and reactive to light. Right eye exhibits no discharge. Left eye exhibits no discharge. No scleral icterus.  Neck: No JVD present.  Cardiovascular: Normal rate, regular rhythm, normal heart sounds and intact distal pulses.  No murmur heard. Pulmonary/Chest: Effort normal and breath sounds normal.  No respiratory distress. She has no wheezes. She has no rales. She exhibits no tenderness.  Abdominal: Soft. Bowel sounds are normal. She exhibits no distension and no mass. There is no tenderness. There is no rebound and no guarding.  Genitourinary:  Genitourinary Comments: Deferred to Dr. Anderson GYN  Musculoskeletal: She exhibits no edema.  Neurological: She is alert. She has normal reflexes. No cranial nerve  deficit. Coordination normal.  Skin: Skin is warm and dry. No rash noted. She is not diaphoretic.  Psychiatric: She has a normal mood and affect. Her behavior is normal. Judgment and thought content normal.  Vitals reviewed.  Weight 176 and was 173 last year       Assessment & Plan:  History of chronic daily headache seen by neurologist  Controlled type 2 diabetes mellitus  Status post gastric bypass surgery January 2017  Essential hypertension  Hyperlipidemia  History of chronic neck pain  History of L5 radiculopathy  Sleep apnea  Obesity  Depression  Status post hysterectomy without oophorectomy  Stress urinary incontinence  History of fatty liver infiltration with liver functions being elevated likely due to that entity.  Plan: She will continue seeing neurologist regarding chronic daily headache Dr. Kumar for management of diabetes.  Return in 6 months or as needed.  Continue diet exercise and weight loss.  An ultrasound of the liver and 2017.  Gallbladder was noted to be absent.  Mild diffuse increased hepatic echogenicity most commonly seen with hepatic steatosis.  Addendum hemoglobin A1c good at 6% and previously was 6.1%.  Lipid panel and TSH were normal as well as CBC and C met. 

## 2016-12-18 ENCOUNTER — Other Ambulatory Visit: Payer: Self-pay | Admitting: Internal Medicine

## 2016-12-20 ENCOUNTER — Encounter: Payer: Self-pay | Admitting: Allergy and Immunology

## 2016-12-20 ENCOUNTER — Ambulatory Visit: Payer: BLUE CROSS/BLUE SHIELD | Admitting: Allergy and Immunology

## 2016-12-20 VITALS — BP 126/84 | HR 80 | Temp 98.4°F | Resp 16 | Ht 64.5 in | Wt 175.6 lb

## 2016-12-20 DIAGNOSIS — J3089 Other allergic rhinitis: Secondary | ICD-10-CM | POA: Diagnosis not present

## 2016-12-20 DIAGNOSIS — H04123 Dry eye syndrome of bilateral lacrimal glands: Secondary | ICD-10-CM

## 2016-12-20 NOTE — Patient Instructions (Addendum)
  1.  Allergen avoidance measures  2.  Treat and prevent inflammation:   A.  OTC Nasacort /Rhinocort -1 spray each nostril 3-7 times a week  3.  If needed:   A.  OTC antihistamine -cetirizine 10mg  one time per day (dry eye?)  B.  OTC Systane eyedrops couple times a day  4. Return to clinic in 12 weeks or earlier if problem

## 2016-12-20 NOTE — Progress Notes (Signed)
Dear Dr. Lenord FellersBaxley,  Thank you for referring Melissa MccreedyKathy M Caponigro to the [facility] [place] on 12/20/2016.   Below is a summation of this patient's evaluation and recommendations.  Thank you for your referral. I will keep you informed about this patient's response to treatment.   If you have any questions please do not hesitate to contact me.   Sincerely,  Jessica PriestEric J. Kozlow, MD Allergy / Immunology [facility]   ______________________________________________________________________    NEW PATIENT NOTE  Referring Provider: Margaree MackintoshBaxley, Mary J, MD Primary Provider: Margaree MackintoshBaxley, Mary J, MD Date of office visit: 12/20/2016    Subjective:   Chief Complaint:  Melissa Benson (DOB: Oct 26, 1960) is a 56 y.o. female who presents to the clinic on 12/20/2016 with a chief complaint of Allergy Testing (Pt presents to have allergy testing) .     HPI: Olegario MessierKathy presents to this clinic in evaluation of problem with allergies.  She has a history of developing problems with sneezing and rhinorrhea without any anosmia or ugly nasal discharge usually occurring on a perennial basis with exacerbations during the spring and fall usually following exposure to dust and pollen and cats.  She does have a history of migraine headache that is under very good control at this point in time under the direction of her neurologist.  She is using Zyrtec on a pretty regular basis which she thinks does help her allergic disease.  She has been diagnosed with dry eye syndrome in the past and intermittently and rarely uses any wetting drops.  She does not have any associated atopic disease.  She did receive the flu vaccine.  Past Medical History:  Diagnosis Date  . Complication of anesthesia   . Diabetes mellitus   . GERD (gastroesophageal reflux disease)   . Headache    no issues since stop caffeine  . History of hiatal hernia   . History of  migraine headaches   . History of urinary tract infection    last one 11/2014   . History of vertigo   . Hyperlipidemia   . Hypertension   . Neck pain, chronic   . OSA (obstructive sleep apnea)    on CPAP  . PONV (postoperative nausea and vomiting)   . Psoriasis   . Rosacea   . Weak urinary stream     Past Surgical History:  Procedure Laterality Date  . 2D ECHOCARDIOGRAM  07/20/2011   EF >55%, normal  . ABDOMINAL HYSTERECTOMY    . APPENDECTOMY    . CARDIOVASCULAR STRESS TEST  07/20/2011   Normal myocardial perfusion study, EKG negative for ischemia, no ECG changes  . CHOLECYSTECTOMY    . colonscopy      polyps removed   . DIAGNOSTIC LAPAROSCOPY    . DILATION AND CURETTAGE OF UTERUS    . LAPAROSCOPIC ROUX-EN-Y GASTRIC BYPASS WITH HIATAL HERNIA REPAIR N/A 01/12/2015   Procedure: LAPAROSCOPIC ROUX-EN-Y GASTRIC BYPASS WITH LYSIS OF ADHESIONS ;  Surgeon: Luretha MurphyMatthew Martin, MD;  Location: [facility];  Service: General;  Laterality: N/A;  . SPINE SURGERY     C6-C7 fusion x2  . SPINE SURGERY     herniated disc L4-L5  . TUBAL LIGATION      Allergies as of 12/20/2016      Reactions   Morphine And Related Nausea And Vomiting   Pt states also caused arm to turn black where medication was administered    Pantoprazole  Hives   Tyloxapol       Medication List      BAYER CONTOUR NEXT TEST test strip Generic drug:  glucose blood USE AS INSTRUCTED TO CHECK BLOOD SUGAR THREE TIMES DAILY   Biotin 5 MG Caps Take by mouth.   bisacodyl 5 MG EC tablet Commonly known as:  DULCOLAX bisacodyl 5 mg tablet,delayed release  TAKE AS DIRECTED   calcium-vitamin D 250-100 MG-UNIT tablet Take 1 tablet by mouth daily.   cyclobenzaprine 5 MG tablet Commonly known as:  FLEXERIL Take 1 tablet (5 mg total) by mouth 2 (two) times daily.   MAGNESIUM PO Take 600 mg by mouth daily.   metFORMIN 500 MG tablet Commonly known as:  GLUCOPHAGE TAKE 1 TABLET(500 MG) BY MOUTH TWICE DAILY WITH A MEAL     multivitamin tablet Take 1 tablet by mouth daily.   mupirocin nasal ointment 2 % Commonly known as:  BACTROBAN NASAL Apply to each nostril two times daily.   PROCTOFOAM HC rectal foam Generic drug:  hydrocortisone-pramoxine APPLY RECTALLY TO THE ANUS TWICE DAILY   promethazine 25 MG tablet Commonly known as:  PHENERGAN TAKE 1 TABLET(25 MG) BY MOUTH EVERY 4 HOURS AS NEEDED   ramipril 5 MG capsule Commonly known as:  ALTACE TAKE 1 CAPSULE(5 MG) BY MOUTH DAILY   rosuvastatin 5 MG tablet Commonly known as:  CRESTOR TAKE 1 TABLET(5 MG) BY MOUTH DAILY   Topiramate ER 50 MG Cp24 Commonly known as:  TROKENDI XR Take 50 mg by mouth daily.   vitamin B-12 1000 MCG tablet Commonly known as:  CYANOCOBALAMIN Take 1,000 mcg by mouth daily.       Review of systems negative except as noted in HPI / PMHx or noted below:  Review of Systems  Constitutional: Negative.   HENT: Negative.   Eyes: Negative.   Respiratory: Negative.   Cardiovascular: Negative.   Gastrointestinal: Negative.   Genitourinary: Negative.   Musculoskeletal: Negative.   Skin: Negative.   Neurological: Negative.   Endo/Heme/Allergies: Negative.   Psychiatric/Behavioral: Negative.     Family History  Problem Relation Age of Onset  . Diabetes Mother   . Heart disease Mother   . Hypertension Mother   . Lung disease Father   . Heart disease Father   . Hypertension Father   . Diabetes Father   . Hypertension Sister   . Hyperlipidemia Brother   . Hypertension Brother   . Heart attack Maternal Grandmother   . Heart disease Maternal Grandmother   . Hypertension Maternal Grandfather   . Diabetes Maternal Grandfather   . Heart disease Maternal Grandfather     Social History   Socioeconomic History  . Marital status: Married    Spouse name: Not on file  . Number of children: 2  . Years of education: college  . Highest education level: Not on file  Social Needs  . Financial resource strain: Not on  file  . Food insecurity - worry: Not on file  . Food insecurity - inability: Not on file  . Transportation needs - medical: Not on file  . Transportation needs - non-medical: Not on file  Occupational History  . Occupation: Corporate treasurer  Tobacco Use  . Smoking status: Former Smoker    Packs/day: 2.50    Years: 8.00    Pack years: 20.00    Types: Cigarettes    Last attempt to quit: 05/18/1983    Years since quitting: 33.6  . Smokeless tobacco: Never Used  Substance  and Sexual Activity  . Alcohol use: No    Alcohol/week: 0.0 oz  . Drug use: No  . Sexual activity: No    Birth control/protection: Abstinence  Other Topics Concern  . Not on file  Social History Narrative   Lives with husband and one of her children in a one story home.  Has 2 children.     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  Education: college.    Environmental and Social history  Lives in a house with a dry environment, cats and dogs located inside the household, no carpet in the bedroom, no plastic on the bed, no plastic on the pillow, no smoking in the household, and employment in an office setting at the credit union.  Objective:   Vitals:   12/20/16 1335  BP: 126/84  Pulse: 80  Resp: 16  Temp: 98.4 F (36.9 C)  SpO2: 98%   Height: 5' 4.5" (163.8 cm) Weight: 175 lb 9.6 oz (79.7 kg)  Physical Exam  Constitutional: She is well-developed, well-nourished, and in no distress.  HENT:  Head: Normocephalic. Head is without right periorbital erythema and without left periorbital erythema.  Right Ear: Tympanic membrane, external ear and ear canal normal.  Left Ear: Tympanic membrane, external ear and ear canal normal.  Nose: Nose normal. No mucosal edema or rhinorrhea.  Mouth/Throat: Oropharynx is clear and moist and mucous membranes are normal. No oropharyngeal exudate.  Eyes: Conjunctivae and lids are normal. Pupils are equal, round, and reactive to light.  Neck: Trachea normal. No tracheal deviation  present. No thyromegaly present.  Cardiovascular: Normal rate, regular rhythm, S1 normal, S2 normal and normal heart sounds.  No murmur heard. Pulmonary/Chest: Effort normal. No stridor. No tachypnea. No respiratory distress. She has no wheezes. She has no rales. She exhibits no tenderness.  Abdominal: Soft. She exhibits no distension and no mass. There is no hepatosplenomegaly. There is no tenderness. There is no rebound and no guarding.  Musculoskeletal: She exhibits no edema or tenderness.  Lymphadenopathy:       Head (right side): No tonsillar adenopathy present.       Head (left side): No tonsillar adenopathy present.    She has no cervical adenopathy.    She has no axillary adenopathy.  Neurological: She is alert. Gait normal.  Skin: No rash noted. She is not diaphoretic. No erythema. No pallor. Nails show no clubbing.  Psychiatric: Mood and affect normal.    Diagnostics: Allergy skin tests were performed.  She did not demonstrate any significant hypersensitivity against a screening panel of aeroallergens or foods other than sensitivity directed at mold.  Assessment and Plan:    1. Other allergic rhinitis   2. Dry eye syndrome of both eyes     1.  Allergen avoidance measures  2.  Treat and prevent inflammation:   A.  OTC Nasacort /Rhinocort -1 spray each nostril 3-7 times a week  3.  If needed:   A.  OTC antihistamine -cetirizine 10mg  one time per day (dry eye?)  B.  OTC Systane eyedrops couple times a day  4. Return to clinic in 12 weeks or earlier if problem  Olegario MessierKathy will utilize anti-inflammatory agents for her upper respiratory tract in the hope of minimizing her upper airway symptoms and I suspect that after a few weeks of this therapy she will be able to discontinue the use of an antihistamine which will probably help her eye issue as well.  I will regroup with her in  12 weeks to assess her response and consider further evaluation and treatment based upon this  response.  Jessica Priest, MD Allergy / Immunology [facility] [place]

## 2016-12-21 ENCOUNTER — Encounter: Payer: Self-pay | Admitting: Allergy and Immunology

## 2017-01-01 NOTE — Patient Instructions (Addendum)
Chronic daily headache and analgesic medications which can contribute to that.  Continue to see Dr. Lucianne MussKumar for management of diabetes.  Try to exercise.

## 2017-01-11 ENCOUNTER — Other Ambulatory Visit: Payer: Self-pay | Admitting: Internal Medicine

## 2017-01-18 DIAGNOSIS — Z1272 Encounter for screening for malignant neoplasm of vagina: Secondary | ICD-10-CM | POA: Diagnosis not present

## 2017-01-18 DIAGNOSIS — Z01419 Encounter for gynecological examination (general) (routine) without abnormal findings: Secondary | ICD-10-CM | POA: Diagnosis not present

## 2017-01-18 DIAGNOSIS — Z1231 Encounter for screening mammogram for malignant neoplasm of breast: Secondary | ICD-10-CM | POA: Diagnosis not present

## 2017-01-18 DIAGNOSIS — Z6829 Body mass index (BMI) 29.0-29.9, adult: Secondary | ICD-10-CM | POA: Diagnosis not present

## 2017-01-26 ENCOUNTER — Encounter: Payer: Self-pay | Admitting: Internal Medicine

## 2017-01-26 ENCOUNTER — Ambulatory Visit: Payer: BLUE CROSS/BLUE SHIELD | Admitting: Internal Medicine

## 2017-01-26 ENCOUNTER — Ambulatory Visit
Admission: RE | Admit: 2017-01-26 | Discharge: 2017-01-26 | Disposition: A | Discharge status: Home / Self Care | Payer: BLUE CROSS/BLUE SHIELD | Source: Ambulatory Visit | Attending: Internal Medicine | Admitting: Internal Medicine

## 2017-01-26 VITALS — BP 122/72 | HR 84 | Temp 98.6°F | Ht 64.5 in | Wt 172.0 lb

## 2017-01-26 DIAGNOSIS — R109 Unspecified abdominal pain: Secondary | ICD-10-CM

## 2017-01-26 LAB — HEMOCCULT GUIAC POC 1CARD (OFFICE): FECAL OCCULT BLD: NEGATIVE

## 2017-01-26 LAB — POCT URINALYSIS DIPSTICK
APPEARANCE: NORMAL
Bilirubin, UA: NEGATIVE
Blood, UA: NEGATIVE
Glucose, UA: NEGATIVE
Ketones, UA: NEGATIVE
LEUKOCYTES UA: NEGATIVE
NITRITE UA: NEGATIVE
ODOR: NORMAL
PH UA: 7 (ref 5.0–8.0)
PROTEIN UA: NEGATIVE
Spec Grav, UA: 1.015 (ref 1.010–1.025)
UROBILINOGEN UA: 0.2 U/dL

## 2017-01-26 MED ORDER — PROMETHAZINE HCL 25 MG PO TABS: ORAL_TABLET | ORAL | 0 refills | Status: DC

## 2017-01-26 NOTE — Patient Instructions (Signed)
KUB flat and upright abdominal film.  Lab work drawn and pending.  Further instructions to follow.  Continue with protein shakes.  Phenergan for nausea.  Call if symptoms worsen.

## 2017-01-26 NOTE — Progress Notes (Signed)
   Subjective:    Patient ID: Melissa Benson, female    DOB: 1960/04/19, 57 y.o.   MRN: [id number]  HPI Patient has a history of gastric bypass which was done January 2017.  The past couple of months she has had intermittent nausea.  This got worse last week.  She is been having some issues with constipation decrease amount of bowel movement and also tonight January 22 she began to vomit.  She vomited several times.  She protein shakes for hydration and nutrition and seems to be able to keep those down.  Says pain is mid abdomen.  Does not radiate.  Cholecystectomy and appendectomy in the past.    Review of Systems see above.  No fever or shaking chills.     Objective:   Physical Exam Abdomen is soft, nondistended without organomegaly.  She is tender in the mid abdomen to the left of the umbilicus with some slight rebound.  There is little stool to guaiac in the rectal vault but it is guaiac negative.  Labs drawn and pending including CBC ,C- met, amylase, dipstick UA is unremarkable.       Assessment & Plan:  Mid abdominal pain etiology unclear.  Considerations include constipation, pancreatitis bowel obstruction.  Urine dipstick unremarkable.  25 minutes spent with patient  Plan: She is going to have KUB flat and upright abdominal films now.  She looks well-hydrated and does not appear to be in acute distress.  Phenergan prescribed for nausea.  Continue with protein shakes for now.

## 2017-01-27 LAB — COMPLETE METABOLIC PANEL WITH GFR
AG RATIO: 1.9 (calc) (ref 1.0–2.5)
ALBUMIN MSPROF: 4.5 g/dL (ref 3.6–5.1)
ALT: 17 U/L (ref 6–29)
AST: 22 U/L (ref 10–35)
Alkaline phosphatase (APISO): 73 U/L (ref 33–130)
BUN/Creatinine Ratio: 33 (calc) — ABNORMAL HIGH (ref 6–22)
BUN: 27 mg/dL — ABNORMAL HIGH (ref 7–25)
CALCIUM: 10 mg/dL (ref 8.6–10.4)
CO2: 28 mmol/L (ref 20–32)
Chloride: 102 mmol/L (ref 98–110)
Creat: 0.82 mg/dL (ref 0.50–1.05)
GFR, EST AFRICAN AMERICAN: 92 mL/min/{1.73_m2} (ref >=60)
GFR, EST NON AFRICAN AMERICAN: 79 mL/min/{1.73_m2} (ref >=60)
GLOBULIN: 2.4 g/dL (ref 1.9–3.7)
Glucose, Bld: 106 mg/dL — ABNORMAL HIGH (ref 65–99)
POTASSIUM: 4.4 mmol/L (ref 3.5–5.3)
Sodium: 140 mmol/L (ref 135–146)
Total Bilirubin: 0.4 mg/dL (ref 0.2–1.2)
Total Protein: 6.9 g/dL (ref 6.1–8.1)

## 2017-01-27 LAB — CBC WITH DIFFERENTIAL/PLATELET
Basophils Absolute: 18 cells/uL (ref 0–200)
Basophils Relative: 0.3 %
EOS PCT: 1.3 %
Eosinophils Absolute: 78 cells/uL (ref 15–500)
HEMATOCRIT: 36.5 % (ref 35.0–45.0)
Hemoglobin: 12.2 g/dL (ref 11.7–15.5)
LYMPHS ABS: 1596 {cells}/uL (ref 850–3900)
MCH: 29.5 pg (ref 27.0–33.0)
MCHC: 33.4 g/dL (ref 32.0–36.0)
MCV: 88.4 fL (ref 80.0–100.0)
MONOS PCT: 6.3 %
MPV: 10.2 fL (ref 7.5–12.5)
NEUTROS ABS: 3930 {cells}/uL (ref 1500–7800)
Neutrophils Relative %: 65.5 %
Platelets: 273 10*3/uL (ref 140–400)
RBC: 4.13 10*6/uL (ref 3.80–5.10)
RDW: 12.4 % (ref 11.0–15.0)
Total Lymphocyte: 26.6 %
WBC mixed population: 378 cells/uL (ref 200–950)
WBC: 6 10*3/uL (ref 3.8–10.8)

## 2017-01-27 LAB — AMYLASE: Amylase: 101 U/L (ref 21–101)

## 2017-01-27 LAB — LIPASE: Lipase: 264 U/L — ABNORMAL HIGH (ref 7–60)

## 2017-01-30 ENCOUNTER — Other Ambulatory Visit: Payer: Self-pay | Admitting: Internal Medicine

## 2017-01-30 DIAGNOSIS — K861 Other chronic pancreatitis: Secondary | ICD-10-CM

## 2017-01-31 ENCOUNTER — Encounter: Payer: Self-pay | Admitting: Internal Medicine

## 2017-01-31 ENCOUNTER — Ambulatory Visit: Payer: BLUE CROSS/BLUE SHIELD | Admitting: Internal Medicine

## 2017-01-31 VITALS — BP 120/80 | HR 84 | Temp 98.4°F | Ht 64.5 in | Wt 167.0 lb

## 2017-01-31 DIAGNOSIS — Z9884 Bariatric surgery status: Secondary | ICD-10-CM

## 2017-01-31 DIAGNOSIS — R1033 Periumbilical pain: Secondary | ICD-10-CM

## 2017-01-31 DIAGNOSIS — K861 Other chronic pancreatitis: Secondary | ICD-10-CM | POA: Diagnosis not present

## 2017-01-31 LAB — LIPASE: Lipase: 96 U/L — ABNORMAL HIGH (ref 7–60)

## 2017-01-31 LAB — AMYLASE: Amylase: 52 U/L (ref 21–101)

## 2017-01-31 NOTE — Patient Instructions (Addendum)
To have CT of the abdomen and pelvis with and without contrast for further evaluation of pancreatitis.  Advance diet slowly.

## 2017-01-31 NOTE — Progress Notes (Signed)
   Subjective:    Patient ID: Melissa Benson, female    DOB: [birth year], 57 y.o.   MRN: [id number]  HPI 57 year old White Female in today for follow-up of abdominal pain.  She is been basically on clear liquids since her diagnosis of mild pancreatitis.  Lipase was 264 and amylase was 101.  She has a history of gastric bypass surgery.  I reviewed her medications and I do not see one that would cause pancreatitis. She has had cholecystectomy and appendectomy in the remote past.  She was also found to be constipated at last visit and she took some milk of magnesia with relief.  No nausea and vomiting at the present time but still has periumbilical abdominal pain.   Review of Systems     Objective:   Physical Exam Abdomen is obese soft nondistended with periumbilical pain particularly to the left of the umbilicus with some firmness.  There is no rebound tenderness just firmness and tenderness to deep palpation.       Assessment & Plan:  ?  Pancreatic mass  Pancreatitis etiology unclear  History of gastric bypass surgery  Plan: Patient to have CT of abdomen and pelvis with and without contrast.

## 2017-02-01 ENCOUNTER — Emergency Department (HOSPITAL_COMMUNITY): Payer: BLUE CROSS/BLUE SHIELD | Admitting: Certified Registered"

## 2017-02-01 ENCOUNTER — Encounter (HOSPITAL_COMMUNITY): Payer: Self-pay | Admitting: Emergency Medicine

## 2017-02-01 ENCOUNTER — Inpatient Hospital Stay (HOSPITAL_COMMUNITY)
Admission: EM | Admit: 2017-02-01 | Discharge: 2017-02-05 | DRG: 330 | Disposition: A | Discharge status: Home / Self Care | Payer: BLUE CROSS/BLUE SHIELD | Attending: General Surgery | Admitting: General Surgery

## 2017-02-01 ENCOUNTER — Emergency Department (HOSPITAL_COMMUNITY): Payer: BLUE CROSS/BLUE SHIELD

## 2017-02-01 ENCOUNTER — Encounter (HOSPITAL_COMMUNITY): Admission: EM | Disposition: A | Discharge status: Home / Self Care | Payer: Self-pay | Source: Home / Self Care

## 2017-02-01 ENCOUNTER — Other Ambulatory Visit: Payer: Self-pay

## 2017-02-01 DIAGNOSIS — G4733 Obstructive sleep apnea (adult) (pediatric): Secondary | ICD-10-CM | POA: Diagnosis present

## 2017-02-01 DIAGNOSIS — K56600 Partial intestinal obstruction, unspecified as to cause: Secondary | ICD-10-CM | POA: Diagnosis not present

## 2017-02-01 DIAGNOSIS — K469 Unspecified abdominal hernia without obstruction or gangrene: Secondary | ICD-10-CM | POA: Diagnosis not present

## 2017-02-01 DIAGNOSIS — K529 Noninfective gastroenteritis and colitis, unspecified: Secondary | ICD-10-CM

## 2017-02-01 DIAGNOSIS — K56609 Unspecified intestinal obstruction, unspecified as to partial versus complete obstruction: Secondary | ICD-10-CM

## 2017-02-01 DIAGNOSIS — Z9889 Other specified postprocedural states: Secondary | ICD-10-CM

## 2017-02-01 DIAGNOSIS — E119 Type 2 diabetes mellitus without complications: Secondary | ICD-10-CM | POA: Diagnosis not present

## 2017-02-01 DIAGNOSIS — K219 Gastro-esophageal reflux disease without esophagitis: Secondary | ICD-10-CM | POA: Diagnosis present

## 2017-02-01 DIAGNOSIS — Z7984 Long term (current) use of oral hypoglycemic drugs: Secondary | ICD-10-CM | POA: Diagnosis not present

## 2017-02-01 DIAGNOSIS — E785 Hyperlipidemia, unspecified: Secondary | ICD-10-CM | POA: Diagnosis present

## 2017-02-01 DIAGNOSIS — R112 Nausea with vomiting, unspecified: Secondary | ICD-10-CM | POA: Diagnosis not present

## 2017-02-01 DIAGNOSIS — K654 Sclerosing mesenteritis: Secondary | ICD-10-CM | POA: Diagnosis present

## 2017-02-01 DIAGNOSIS — I10 Essential (primary) hypertension: Secondary | ICD-10-CM | POA: Diagnosis not present

## 2017-02-01 DIAGNOSIS — Z79899 Other long term (current) drug therapy: Secondary | ICD-10-CM | POA: Diagnosis not present

## 2017-02-01 DIAGNOSIS — Z885 Allergy status to narcotic agent status: Secondary | ICD-10-CM | POA: Diagnosis not present

## 2017-02-01 DIAGNOSIS — K660 Peritoneal adhesions (postprocedural) (postinfection): Secondary | ICD-10-CM | POA: Diagnosis not present

## 2017-02-01 DIAGNOSIS — L7634 Postprocedural seroma of skin and subcutaneous tissue following other procedure: Secondary | ICD-10-CM | POA: Diagnosis not present

## 2017-02-01 DIAGNOSIS — Z888 Allergy status to other drugs, medicaments and biological substances status: Secondary | ICD-10-CM | POA: Diagnosis not present

## 2017-02-01 DIAGNOSIS — K565 Intestinal adhesions [bands], unspecified as to partial versus complete obstruction: Principal | ICD-10-CM | POA: Diagnosis present

## 2017-02-01 DIAGNOSIS — Z9884 Bariatric surgery status: Secondary | ICD-10-CM

## 2017-02-01 DIAGNOSIS — M7989 Other specified soft tissue disorders: Secondary | ICD-10-CM | POA: Diagnosis not present

## 2017-02-01 DIAGNOSIS — R109 Unspecified abdominal pain: Secondary | ICD-10-CM | POA: Diagnosis not present

## 2017-02-01 DIAGNOSIS — K458 Other specified abdominal hernia without obstruction or gangrene: Secondary | ICD-10-CM | POA: Diagnosis present

## 2017-02-01 DIAGNOSIS — Z87891 Personal history of nicotine dependence: Secondary | ICD-10-CM | POA: Diagnosis not present

## 2017-02-01 HISTORY — PX: LAPAROTOMY: SHX154

## 2017-02-01 HISTORY — PX: LAPAROSCOPY: SHX197

## 2017-02-01 LAB — CBC
HCT: 40.4 % (ref 36.0–46.0)
Hemoglobin: 13.7 g/dL (ref 12.0–15.0)
MCH: 30.4 pg (ref 26.0–34.0)
MCHC: 33.9 g/dL (ref 30.0–36.0)
MCV: 89.6 fL (ref 78.0–100.0)
PLATELETS: 289 10*3/uL (ref 150–400)
RBC: 4.51 MIL/uL (ref 3.87–5.11)
RDW: 13 % (ref 11.5–15.5)
WBC: 9.1 10*3/uL (ref 4.0–10.5)

## 2017-02-01 LAB — URINALYSIS, ROUTINE W REFLEX MICROSCOPIC
Bilirubin Urine: NEGATIVE
Glucose, UA: NEGATIVE mg/dL
HGB URINE DIPSTICK: NEGATIVE
KETONES UR: 5 mg/dL — AB
LEUKOCYTES UA: NEGATIVE
Nitrite: NEGATIVE
PROTEIN: NEGATIVE mg/dL
Specific Gravity, Urine: 1.02 (ref 1.005–1.030)
pH: 8 (ref 5.0–8.0)

## 2017-02-01 LAB — COMPREHENSIVE METABOLIC PANEL
ALBUMIN: 4.3 g/dL (ref 3.5–5.0)
ALT: 20 U/L (ref 14–54)
ANION GAP: 14 (ref 5–15)
AST: 26 U/L (ref 15–41)
Alkaline Phosphatase: 74 U/L (ref 38–126)
BUN: 24 mg/dL — AB (ref 6–20)
CALCIUM: 9.5 mg/dL (ref 8.9–10.3)
CO2: 22 mmol/L (ref 22–32)
CREATININE: 0.76 mg/dL (ref 0.44–1.00)
Chloride: 101 mmol/L (ref 101–111)
GFR calc non Af Amer: >60 mL/min (ref >60)
Glucose, Bld: 129 mg/dL — ABNORMAL HIGH (ref 65–99)
POTASSIUM: 3.7 mmol/L (ref 3.5–5.1)
SODIUM: 137 mmol/L (ref 135–145)
Total Bilirubin: 0.8 mg/dL (ref 0.3–1.2)
Total Protein: 7 g/dL (ref 6.5–8.1)

## 2017-02-01 LAB — I-STAT BETA HCG BLOOD, ED (MC, WL, AP ONLY): I-stat hCG, quantitative: <5 m[IU]/mL (ref <5)

## 2017-02-01 LAB — LIPASE, BLOOD: Lipase: 27 U/L (ref 11–51)

## 2017-02-01 LAB — I-STAT CG4 LACTIC ACID, ED: LACTIC ACID, VENOUS: 0.7 mmol/L (ref 0.5–1.9)

## 2017-02-01 SURGERY — LAPAROSCOPY, DIAGNOSTIC
Anesthesia: General | Site: Abdomen

## 2017-02-01 MED ORDER — FENTANYL CITRATE (PF) 100 MCG/2ML IJ SOLN
INTRAMUSCULAR | Status: DC | PRN
  Administered 2017-02-01: 100 ug via INTRAVENOUS
  Administered 2017-02-01: 50 ug via INTRAVENOUS
  Administered 2017-02-01: 100 ug via INTRAVENOUS
  Administered 2017-02-02 (×2): 50 ug via INTRAVENOUS

## 2017-02-01 MED ORDER — SODIUM CHLORIDE 0.9 % IV BOLUS (SEPSIS)
1000.0000 mL | Freq: Once | INTRAVENOUS | Status: AC
  Administered 2017-02-01: 1000 mL via INTRAVENOUS

## 2017-02-01 MED ORDER — PROPOFOL 10 MG/ML IV BOLUS
INTRAVENOUS | Status: DC | PRN
  Administered 2017-02-01: 140 mg via INTRAVENOUS

## 2017-02-01 MED ORDER — DEXAMETHASONE SODIUM PHOSPHATE 10 MG/ML IJ SOLN
INTRAMUSCULAR | Status: DC | PRN
  Administered 2017-02-01: 4 mg via INTRAVENOUS

## 2017-02-01 MED ORDER — SUCCINYLCHOLINE CHLORIDE 20 MG/ML IJ SOLN
INTRAMUSCULAR | Status: DC | PRN
  Administered 2017-02-01: 80 mg via INTRAVENOUS

## 2017-02-01 MED ORDER — CEFAZOLIN SODIUM-DEXTROSE 2-4 GM/100ML-% IV SOLN
2.0000 g | Freq: Once | INTRAVENOUS | Status: AC
  Administered 2017-02-01: 2 g via INTRAVENOUS

## 2017-02-01 MED ORDER — MIDAZOLAM HCL 2 MG/2ML IJ SOLN
INTRAMUSCULAR | Status: AC
  Filled 2017-02-01: qty 2

## 2017-02-01 MED ORDER — SODIUM CHLORIDE 0.9 % IV SOLN
INTRAVENOUS | Status: DC
  Administered 2017-02-02 (×3): via INTRAVENOUS

## 2017-02-01 MED ORDER — PROPOFOL 10 MG/ML IV BOLUS
INTRAVENOUS | Status: AC
  Filled 2017-02-01: qty 40

## 2017-02-01 MED ORDER — MIDAZOLAM HCL 5 MG/5ML IJ SOLN
INTRAMUSCULAR | Status: DC | PRN
  Administered 2017-02-01: 2 mg via INTRAVENOUS

## 2017-02-01 MED ORDER — BUPIVACAINE-EPINEPHRINE (PF) 0.5% -1:200000 IJ SOLN
INTRAMUSCULAR | Status: AC
  Filled 2017-02-01: qty 30

## 2017-02-01 MED ORDER — CEFAZOLIN (ANCEF) 1 G IV SOLR: 2.0000 g | INTRAVENOUS | Status: DC

## 2017-02-01 MED ORDER — FENTANYL CITRATE (PF) 250 MCG/5ML IJ SOLN
INTRAMUSCULAR | Status: AC
  Filled 2017-02-01: qty 5

## 2017-02-01 MED ORDER — 0.9 % SODIUM CHLORIDE (POUR BTL) OPTIME
TOPICAL | Status: DC | PRN
  Administered 2017-02-01: 1000 mL

## 2017-02-01 MED ORDER — ROCURONIUM BROMIDE 100 MG/10ML IV SOLN
INTRAVENOUS | Status: DC | PRN
  Administered 2017-02-01: 50 mg via INTRAVENOUS
  Administered 2017-02-02: 20 mg via INTRAVENOUS

## 2017-02-01 MED ORDER — LACTATED RINGERS IV SOLN
INTRAVENOUS | Status: DC | PRN
  Administered 2017-02-01 – 2017-02-02 (×2): via INTRAVENOUS

## 2017-02-01 MED ORDER — BUPIVACAINE HCL (PF) 0.25 % IJ SOLN
INTRAMUSCULAR | Status: AC
  Filled 2017-02-01: qty 30

## 2017-02-01 MED ORDER — STERILE WATER FOR IRRIGATION IR SOLN
Status: DC | PRN
  Administered 2017-02-01: 1000 mL

## 2017-02-01 MED ORDER — LIDOCAINE HCL (CARDIAC) 20 MG/ML IV SOLN
INTRAVENOUS | Status: DC | PRN
  Administered 2017-02-01: 80 mg via INTRAVENOUS

## 2017-02-01 MED ORDER — SCOPOLAMINE 1 MG/3DAYS TD PT72
MEDICATED_PATCH | TRANSDERMAL | Status: DC | PRN
  Administered 2017-02-01: 1 via TRANSDERMAL

## 2017-02-01 MED ORDER — IOPAMIDOL (ISOVUE-300) INJECTION 61%
INTRAVENOUS | Status: AC
  Administered 2017-02-01: 100 mL
  Filled 2017-02-01: qty 100

## 2017-02-01 SURGICAL SUPPLY — 76 items
ADH SKN CLS APL DERMABOND .7 (GAUZE/BANDAGES/DRESSINGS)
APPLIER CLIP 5 13 M/L LIGAMAX5 (MISCELLANEOUS)
APR CLP MED LRG 5 ANG JAW (MISCELLANEOUS)
BINDER ABD UNIV 12 45-62 (WOUND CARE) ×1 IMPLANT
BINDER ABDOMINAL 46IN 62IN (WOUND CARE)
BLADE CLIPPER SURG (BLADE) IMPLANT
CANISTER SUCT 3000ML PPV (MISCELLANEOUS) ×2 IMPLANT
CHLORAPREP W/TINT 26ML (MISCELLANEOUS) ×2 IMPLANT
CLIP APPLIE 5 13 M/L LIGAMAX5 (MISCELLANEOUS) IMPLANT
COVER SURGICAL LIGHT HANDLE (MISCELLANEOUS) ×2 IMPLANT
DERMABOND ADVANCED (GAUZE/BANDAGES/DRESSINGS)
DERMABOND ADVANCED .7 DNX12 (GAUZE/BANDAGES/DRESSINGS) ×1 IMPLANT
DEVICE PMI PUNCTURE CLOSURE (MISCELLANEOUS) ×2 IMPLANT
DEVICE SECURE STRAP 25 ABSORB (INSTRUMENTS) ×1 IMPLANT
DEVICE TROCAR PUNCTURE CLOSURE (ENDOMECHANICALS) IMPLANT
DRAPE LAPAROSCOPIC ABDOMINAL (DRAPES) ×2 IMPLANT
DRAPE WARM FLUID 44X44 (DRAPE) ×1 IMPLANT
DRSG OPSITE POSTOP 4X10 (GAUZE/BANDAGES/DRESSINGS) IMPLANT
DRSG OPSITE POSTOP 4X6 (GAUZE/BANDAGES/DRESSINGS) ×1 IMPLANT
DRSG OPSITE POSTOP 4X8 (GAUZE/BANDAGES/DRESSINGS) IMPLANT
ELECT BLADE 6.5 EXT (BLADE) IMPLANT
ELECT CAUTERY BLADE 6.4 (BLADE) ×2 IMPLANT
ELECT REM PT RETURN 9FT ADLT (ELECTROSURGICAL) ×2
ELECTRODE REM PT RTRN 9FT ADLT (ELECTROSURGICAL) ×1 IMPLANT
GLOVE BIO SURGEON STRL SZ 6 (GLOVE) ×2 IMPLANT
GLOVE BIOGEL PI IND STRL 6.5 (GLOVE) ×1 IMPLANT
GLOVE BIOGEL PI INDICATOR 6.5 (GLOVE) ×1
GOWN STRL REUS W/ TWL LRG LVL3 (GOWN DISPOSABLE) ×3 IMPLANT
GOWN STRL REUS W/TWL LRG LVL3 (GOWN DISPOSABLE) ×6
KIT BASIN OR (CUSTOM PROCEDURE TRAY) ×2 IMPLANT
KIT ROOM TURNOVER OR (KITS) ×2 IMPLANT
LIGASURE IMPACT 36 18CM CVD LR (INSTRUMENTS) IMPLANT
MARKER SKIN DUAL TIP RULER LAB (MISCELLANEOUS) ×2 IMPLANT
NDL SPNL 22GX3.5 QUINCKE BK (NEEDLE) ×1 IMPLANT
NEEDLE SPNL 22GX3.5 QUINCKE BK (NEEDLE) ×2 IMPLANT
NS IRRIG 1000ML POUR BTL (IV SOLUTION) ×3 IMPLANT
PACK GENERAL/GYN (CUSTOM PROCEDURE TRAY) ×2 IMPLANT
PAD ARMBOARD 7.5X6 YLW CONV (MISCELLANEOUS) ×3 IMPLANT
PENCIL BUTTON HOLSTER BLD 10FT (ELECTRODE) ×1 IMPLANT
RELOAD PROXIMATE 75MM BLUE (ENDOMECHANICALS) ×4 IMPLANT
RELOAD STAPLE 75 3.8 BLU REG (ENDOMECHANICALS) IMPLANT
SCISSORS LAP 5X35 DISP (ENDOMECHANICALS) ×2 IMPLANT
SET IRRIG TUBING LAPAROSCOPIC (IRRIGATION / IRRIGATOR) IMPLANT
SHEARS HARMONIC ACE PLUS 36CM (ENDOMECHANICALS) IMPLANT
SLEEVE ENDOPATH XCEL 5M (ENDOMECHANICALS) ×6 IMPLANT
SOLUTION ANTI FOG 6CC (MISCELLANEOUS) ×1 IMPLANT
SPECIMEN JAR LARGE (MISCELLANEOUS) IMPLANT
SPONGE LAP 18X18 X RAY DECT (DISPOSABLE) ×1 IMPLANT
STAPLER GUN LINEAR PROX 60 (STAPLE) ×1 IMPLANT
STAPLER PROXIMATE 75MM BLUE (STAPLE) ×1 IMPLANT
STAPLER VISISTAT 35W (STAPLE) ×2 IMPLANT
SUCTION POOLE TIP (SUCTIONS) ×1 IMPLANT
SUT ETHIBOND CT1 BRD #0 30IN (SUTURE) IMPLANT
SUT MNCRL AB 4-0 PS2 18 (SUTURE) ×2 IMPLANT
SUT NOVA NAB GS-21 0 18 T12 DT (SUTURE) IMPLANT
SUT PDS AB 1 TP1 54 (SUTURE) ×1 IMPLANT
SUT PDS AB 1 TP1 96 (SUTURE) ×2 IMPLANT
SUT PDS AB 2-0 CT1 27 (SUTURE) IMPLANT
SUT SILK 2 0 SH (SUTURE) ×1 IMPLANT
SUT SILK 2 0 SH CR/8 (SUTURE) ×2 IMPLANT
SUT SILK 2 0 TIES 10X30 (SUTURE) ×2 IMPLANT
SUT SILK 3 0 SH CR/8 (SUTURE) ×2 IMPLANT
SUT SILK 3 0 TIES 10X30 (SUTURE) ×2 IMPLANT
SUT VIC AB 3-0 SH 18 (SUTURE) IMPLANT
TOWEL OR 17X24 6PK STRL BLUE (TOWEL DISPOSABLE) ×2 IMPLANT
TOWEL OR 17X26 10 PK STRL BLUE (TOWEL DISPOSABLE) ×2 IMPLANT
TRAY FOLEY CATH SILVER 14FR (SET/KITS/TRAYS/PACK) IMPLANT
TRAY FOLEY W/METER SILVER 16FR (SET/KITS/TRAYS/PACK) ×1 IMPLANT
TRAY LAPAROSCOPIC MC (CUSTOM PROCEDURE TRAY) ×2 IMPLANT
TROCAR BLADELESS 5MM (ENDOMECHANICALS) ×1 IMPLANT
TROCAR XCEL BLUNT TIP 100MML (ENDOMECHANICALS) IMPLANT
TROCAR XCEL NON-BLD 11X100MML (ENDOMECHANICALS) ×2 IMPLANT
TROCAR XCEL NON-BLD 5MMX100MML (ENDOMECHANICALS) ×2 IMPLANT
TUBE CONNECTING 12X1/4 (SUCTIONS) ×1 IMPLANT
TUBING INSUFFLATION (TUBING) ×2 IMPLANT
WATER STERILE IRR 1000ML POUR (IV SOLUTION) ×2 IMPLANT

## 2017-02-01 NOTE — ED Provider Notes (Signed)
[facility] EMERGENCY DEPARTMENT Provider Note   CSN: [id number] Arrival date & time: 02/01/17  1313     History   Chief Complaint Chief Complaint  Patient presents with  . Abdominal Pain    HPI Melissa Benson is a 57 y.o. female with past medical history of GERD, hypertension, diabetes, presenting to the ED for periumbilical abdominal pain that worsened last night.  Patient was seen by her PCP last week for 2 months of nausea and vomiting with epigastric abdominal pain.  Labs were drawn in the clinic, revealing elevated lipase of 264 on 01/26/2017.  She was treated with nausea medication and instructed to remain n.p.o.  She was reevaluated yesterday in the clinic, improved lipase to 96.  She was instructed to begin clear liquid diet as tolerated.  She states she had some chicken noodle soup yesterday evening and began having immediate epigastric and periumbilical abdominal pain described as initially burning and then constant aching sensation, with nausea and vomiting.  She states her PCP instructed her report to the ED for CT scan.  Past abdominal surgery history includes Roux-en-Y gastric bypass 3 years ago, appendectomy, cholecystectomy, and hysterectomy.  Denies history of obstruction.  States decreased bowel movements throughout the past week, however has been n.p.o. for that time.  Small bowel movement today which was normal.  Denies fever, chills, urinary symptoms, blood in stool.  The history is provided by the patient.    Past Medical History:  Diagnosis Date  . Complication of anesthesia   . Diabetes mellitus   . GERD (gastroesophageal reflux disease)   . Headache    no issues since stop caffeine  . History of hiatal hernia   . History of migraine headaches   . History of urinary tract infection    last one 11/2014   . History of vertigo   . Hyperlipidemia   . Hypertension   . Neck pain, chronic   . OSA (obstructive sleep apnea)    on CPAP  . PONV  (postoperative nausea and vomiting)   . Psoriasis   . Rosacea   . Weak urinary stream     Patient Active Problem List   Diagnosis Date Noted  . Chronic daily headache 07/27/2015  . 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 Controlled diabetes mellitus type II without complication (HCC) 11/10/2014  . Obesity 10/15/2012  . Hyperlipidemia 09/26/2012  . Chronic neck pain 12/04/2010  . History of migraine headaches 12/04/2010  . E11.9 06/03/2010  . Hypertension 06/03/2010  . OSA on CPAP 06/03/2010    Past Surgical History:  Procedure Laterality Date  . 2D ECHOCARDIOGRAM  07/20/2011   EF >55%, normal  . ABDOMINAL HYSTERECTOMY    . APPENDECTOMY    . CARDIOVASCULAR STRESS TEST  07/20/2011   Normal myocardial perfusion study, EKG negative for ischemia, no ECG changes  . CHOLECYSTECTOMY    . colonscopy      polyps removed   . DIAGNOSTIC LAPAROSCOPY    . DILATION AND CURETTAGE OF UTERUS    . LAPAROSCOPIC ROUX-EN-Y GASTRIC BYPASS WITH HIATAL HERNIA REPAIR N/A 01/12/2015   Procedure: LAPAROSCOPIC ROUX-EN-Y GASTRIC BYPASS WITH LYSIS OF ADHESIONS ;  Surgeon: Luretha Murphy, MD;  Location: [facility];  Service: General;  Laterality: N/A;  . SPINE SURGERY     C6-C7 fusion x2  . SPINE SURGERY     herniated disc L4-L5  . TUBAL LIGATION      OB History  No data available       Home Medications    Prior to Admission medications   Medication Sig Start Date End Date Taking? Authorizing Provider  BAYER CONTOUR NEXT TEST test strip USE AS INSTRUCTED TO CHECK BLOOD SUGAR THREE TIMES DAILY 11/02/15  Yes Baxley, Luanna Cole, MD  Biotin 5 MG CAPS Take 5 mg by mouth daily.    Yes [provider]  bisacodyl (DULCOLAX) 5 MG EC tablet bisacodyl 5 mg tablet daily as needed for contipation   Yes [provider]  calcium-vitamin D 250-100 MG-UNIT tablet Take 1 tablet by mouth 3 (three) times daily.    Yes [provider]  cyclobenzaprine (FLEXERIL) 5 MG tablet Take 1 tablet  (5 mg total) by mouth 2 (two) times daily. Patient taking differently: Take 5 mg by mouth 3 (three) times daily as needed for muscle spasms.  06/20/16  Yes Patel, Donika K, DO  fluticasone (FLONASE) 50 MCG/ACT nasal spray Place 1-2 sprays into both nostrils daily as needed for allergies or rhinitis.   Yes [provider]  MAGNESIUM PO Take 250 mg by mouth 2 (two) times daily.    Yes [provider]  metFORMIN (GLUCOPHAGE) 500 MG tablet TAKE 1 TABLET(500 MG) BY MOUTH TWICE DAILY WITH A MEAL 09/23/16  Yes Baxley, Luanna Cole, MD  Multiple Vitamin (MULTIVITAMIN) tablet Take 1 tablet by mouth daily.   Yes [provider]  PROCTOFOAM HC rectal foam APPLY RECTALLY TO THE ANUS TWICE DAILY Patient taking differently: APPLY RECTALLY TO THE ANUS TWICE DAILY AS NEEDED FOR PAIN 10/28/16  Yes Baxley, Luanna Cole, MD  promethazine (PHENERGAN) 25 MG tablet TAKE 1 TABLET(25 MG) BY MOUTH EVERY 8 HOURS AS NEEDED Patient taking differently: Take 25 mg by mouth every 8 (eight) hours as needed for nausea or vomiting.  01/26/17  Yes Baxley, Luanna Cole, MD  ramipril (ALTACE) 5 MG capsule TAKE 1 CAPSULE(5 MG) BY MOUTH DAILY 01/11/17  Yes Baxley, Luanna Cole, MD  rosuvastatin (CRESTOR) 5 MG tablet TAKE 1 TABLET(5 MG) BY MOUTH DAILY 12/19/16  Yes Baxley, Luanna Cole, MD  Topiramate ER (TROKENDI XR) 50 MG CP24 Take 50 mg by mouth daily. 04/06/16  Yes Patel, Donika K, DO  vitamin B-12 (CYANOCOBALAMIN) 1000 MCG tablet Take 1,000 mcg by mouth daily.   Yes [provider]    Family History Family History  Problem Relation Age of Onset  . Diabetes Mother   . Heart disease Mother   . Hypertension Mother   . Lung disease Father   . Heart disease Father   . Hypertension Father   . Diabetes Father   . Hypertension Sister   . Hyperlipidemia Brother   . Hypertension Brother   . Heart attack Maternal Grandmother   . Heart disease Maternal Grandmother   . Hypertension Maternal Grandfather   . Diabetes Maternal  Grandfather   . Heart disease Maternal Grandfather     Social History Social History   Tobacco Use  . Smoking status: Former Smoker    Packs/day: 2.50    Years: 8.00    Pack years: 20.00    Types: Cigarettes    Last attempt to quit: 05/18/1983    Years since quitting: 33.7  . Smokeless tobacco: Never Used  Substance Use Topics  . Alcohol use: No    Alcohol/week: 0.0 oz  . Drug use: No     Allergies   Morphine and related; Pantoprazole; and Tyloxapol   Review of Systems Review of  Systems  Constitutional: Negative for fever.  Gastrointestinal: Positive for abdominal pain, nausea and vomiting. Negative for blood in stool, constipation and diarrhea.  Genitourinary: Negative for dysuria and frequency.  All other systems reviewed and are negative.    Physical Exam Updated Vital Signs BP (!) 115/52   Pulse 76   Temp 99 F (37.2 C) (Oral)   Resp 16   SpO2 98%   Physical Exam  Constitutional: She appears well-developed and well-nourished. She does not appear ill. No distress.  HENT:  Head: Normocephalic and atraumatic.  Mouth/Throat: Oropharynx is clear and moist.  Eyes: Conjunctivae are normal.  Cardiovascular: Normal rate, regular rhythm, normal heart sounds and intact distal pulses.  Pulmonary/Chest: Effort normal and breath sounds normal.  Abdominal: Soft. Normal appearance. She exhibits no distension and no mass. There is tenderness in the epigastric area, periumbilical area and left upper quadrant. There is no rigidity, no rebound and no guarding.  Bowel sounds slightly decreased.  Neurological: She is alert.  Skin: Skin is warm.  Psychiatric: She has a normal mood and affect. Her behavior is normal.  Nursing note and vitals reviewed.    ED Treatments / Results  Labs (all labs ordered are listed, but only abnormal results are displayed) Labs Reviewed  COMPREHENSIVE METABOLIC PANEL - Abnormal; Notable for the following components:      Result Value    Glucose, Bld 129 (*)    BUN 24 (*)    All other components within normal limits  URINALYSIS, ROUTINE W REFLEX MICROSCOPIC - Abnormal; Notable for the following components:   Color, Urine AMBER (*)    APPearance CLOUDY (*)    Ketones, ur 5 (*)    All other components within normal limits  LIPASE, BLOOD  CBC  I-STAT BETA HCG BLOOD, ED ([facility], [facility], [facility] ONLY)  I-STAT CG4 LACTIC ACID, ED    EKG  EKG Interpretation None       Radiology Ct Abdomen Pelvis W Contrast  Result Date: 02/01/2017 CLINICAL DATA:  Abdominal pain and periumbilical tenderness with nausea for the past week. Difficulty eating with a subsequent 6 lb weight loss. EXAM: CT ABDOMEN AND PELVIS WITH CONTRAST TECHNIQUE: Multidetector CT imaging of the abdomen and pelvis was performed using the standard protocol following bolus administration of intravenous contrast. CONTRAST:  100 cc Isovue-300 COMPARISON:  Abdomen and pelvis radiograph dated 01/26/2017. FINDINGS: Lower chest: Clear lung bases. Hepatobiliary: No focal liver abnormality is seen. Status post cholecystectomy. No biliary dilatation. Pancreas: Unremarkable. No pancreatic ductal dilatation or surrounding inflammatory changes. Spleen: Normal in size without focal abnormality. Adrenals/Urinary Tract: Adrenal glands are unremarkable. Kidneys are normal, without renal calculi, focal lesion, or hydronephrosis. Bladder is unremarkable. Stomach/Bowel: Post gastric bypass changes. Diffuse wall thickening with mild adjacent soft tissue stranding involving a loop of jejunum in the right lower abdomen and mid abdomen. There are findings suggesting an internal hernia at this location with some swirling of the mesentery. There are also several minimally dilated loops of jejunum. Mildly prominent stool in the colon. Surgically absent appendix. Vascular/Lymphatic: No significant vascular findings are present. No enlarged abdominal or pelvic lymph nodes. Reproductive: Status post hysterectomy.  No adnexal masses. Other: Just below the umbilicus and beneath the anterior abdominal wall, there is an oval fat density mass with a thin surrounding rim, measuring 4.1 cm in length on sagittal image number 86 and 4.6 x 2.7 cm on axial image number 56 of series 3. Bilateral pelvic phleboliths and right ovarian vein phleboliths. Tiny  amount of free peritoneal fluid. Musculoskeletal: Lumbar and lower thoracic spine degenerative changes. IMPRESSION: 1. Enteritis involving a loop of small bowel in the mid abdomen and right lower abdomen with a possible associated internal hernia. This is suspicious for ischemic enteritis due to an internal hernia with some associated mesenteric twisting and possible bowel entrapment. Infectious and inflammatory enteritis are also possibilities. 2. Mildly dilated loops of jejunum, most likely due to obstruction by the loop involved with the enteritis and possible internal hernia. 3. Minimal free peritoneal fluid. 4. 4.6 x 4.1 x 2.7 cm fat density mass in the anterior abdomen just below the umbilicus. This most likely represents an area of fat necrosis. This would be an unusual location for a lipoma. 5. Status post gastric bypass. These results were called by telephone at the time of interpretation on 02/01/2017 at 9:09 pm to [place] Cela Newcom, PA-C , who verbally acknowledged these results. Electronically Signed   By: Beckie Salts M.D.   On: 02/01/2017 21:09    Procedures Procedures (including critical care time)  Medications Ordered in ED Medications  ceFAZolin (ANCEF) IVPB 2g/100 mL premix (not administered)  0.9 %  sodium chloride infusion (not administered)  sodium chloride 0.9 % bolus 1,000 mL (1,000 mLs Intravenous New Bag/Given 02/01/17 1925)  iopamidol (ISOVUE-300) 61 % injection (100 mLs  Contrast Given 02/01/17 2021)     Initial Impression / Assessment and Plan / ED Course  I have reviewed the triage vital signs and the nursing notes.  Pertinent labs & imaging  results that were available during my care of the patient were reviewed by me and considered in my medical decision making (see chart for details).  Clinical Course as of Feb 01 2210  Wed Feb 01, 2017  2121 Dr. Fredricka Bonine w General surgery to see patient.  [JR]  2147 Dr. Fredricka Bonine to take pt to surgery tonight, and will admit pt.  [JR]    Clinical Course User Index [JR] Mirakle Tomlin, [place] N, PA-C    Pt presenting with N/V and mid-abdominal pain, ongoing for some time. Pt initially treated by PCP for constipation and pancreatitis w elevated lipase. Pt with improvement in symptoms after a few days of NPO. Pt re-evaluated by PCP yesterday, with improvement in lipase, and began advancing her diet. Abdominal pain, N/V returned last night. Pt is afebrile, nontoxic-appearing. Reports mild abdominal pain, denies nausea in ED (treated with phenergan at home). Labs done in triage w normal lipase, no leukocytosis. IVF given. Given pt's surgical hx and persistent sx, CT abd was ordered. CT revealing enteritis involving a loop of small bowel, with possible internal hernia; findings reportedly suspicious for ischemic bowel. General surgery consulted; Dr. Fredricka Bonine evaluated pt and recommending OR tonight. Gen surg to admit. Pt re-evaluated, not requesting any pain medications. Stable at this time.  Patient discussed with and seen by Dr. Madilyn Hook.  The patient appears reasonably stabilized for admission considering the current resources, flow, and capabilities available in the ED at this time, and I doubt any other [facility] requiring further screening and/or treatment in the ED prior to admission.   Final Clinical Impressions(s) / ED Diagnoses   Final diagnoses:  Small bowel obstruction [facility])  Enteritis    ED Discharge Orders    None       Santiago Graf, [place] N, PA-C 02/01/17 2213    Tilden Fossa, MD 02/03/17 1315

## 2017-02-01 NOTE — ED Triage Notes (Signed)
Pt to ER for evaluation of lower abdominal pain, reports PCP sent her here for CT scan, pt reports significant nausea and vomiting with PO intake, states diarrhea last week but since has resolved. Pt states over the last week her PCP did lab work revealing elevated pancreatic values, states was made NPO until yesterday, was cleared to start eating again and with any PO intake she is vomiting. Pt reports 10 lbs weight loss in one week.

## 2017-02-01 NOTE — Anesthesia Procedure Notes (Signed)
Procedure Name: Intubation Date/Time: 02/01/2017 10:32 PM Performed by: Babs Bertin, CRNA Pre-anesthesia Checklist: Patient identified, Emergency Drugs available, Suction available and Patient being monitored Patient Re-evaluated:Patient Re-evaluated prior to induction Oxygen Delivery Method: Circle System Utilized Preoxygenation: Pre-oxygenation with 100% oxygen Induction Type: IV induction, Rapid sequence and Cricoid Pressure applied Laryngoscope Size: Mac and 3 Grade View: Grade I Tube type: Oral Tube size: 7.0 mm Number of attempts: 1 Airway Equipment and Method: Stylet and Oral airway Placement Confirmation: ETT inserted through vocal cords under direct vision,  positive ETCO2 and breath sounds checked- equal and bilateral Secured at: 21 cm Tube secured with: Tape Dental Injury: Teeth and Oropharynx as per pre-operative assessment

## 2017-02-01 NOTE — H&P (Addendum)
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 Daphine Deutscher. She's been having issues with central abdominal pain and nausea for a couple of months but for the last week this has become acutely worse. She is experience postprandial nausea and vomiting, as well as pain which seems to be centered around and just to the right of the umbilicus. She has lost about 10 pounds in the last week due to symptoms. She was initially treated for constipation with milk of magnesia and while she did have a bowel movement, she continued to have the symptoms. She was then thought to have pancreatitis due to an elevated lipase (264) and was treated with antibiotics and a liquid diet which seemed to help some, however she despite lowering lipase continued to have symptoms and yesterday when she tried to eat some chicken soup had immediate pain followed by nausea and vomiting. She presented to the ER today where a CT scan demonstrates the findings below, concerning for internal hernia although the location is atypical considering her bypass anatomy. Her other abdominal surgeries include open cholecystectomy, open appendectomy, tubal ligation and hysterectomy. The bypass was her last surgery, and Dr. Ermalene Searing note describes significant adhesio lysis along the anterior abdominal wall She was last seen in our office in October at which time she was doing well.  Allergies  Allergen Reactions  . Morphine And Related Nausea And Vomiting    Pt states also caused arm to turn black where medication was administered   . Pantoprazole Hives  . Tyloxapol     Past Medical History:  Diagnosis Date  . Complication of anesthesia   . Diabetes mellitus   . GERD (gastroesophageal reflux disease)   . Headache    no issues since stop caffeine  . History of hiatal hernia   . History of migraine headaches   . History of  urinary tract infection    last one 11/2014   . History of vertigo   . Hyperlipidemia   . Hypertension   . Neck pain, chronic   . OSA (obstructive sleep apnea)    on CPAP  . PONV (postoperative nausea and vomiting)   . Psoriasis   . Rosacea   . Weak urinary stream     Past Surgical History:  Procedure Laterality Date  . 2D ECHOCARDIOGRAM  07/20/2011   EF >55%, normal  . ABDOMINAL HYSTERECTOMY    . APPENDECTOMY    . CARDIOVASCULAR STRESS TEST  07/20/2011   Normal myocardial perfusion study, EKG negative for ischemia, no ECG changes  . CHOLECYSTECTOMY    . colonscopy      polyps removed   . DIAGNOSTIC LAPAROSCOPY    . DILATION AND CURETTAGE OF UTERUS    . LAPAROSCOPIC ROUX-EN-Y GASTRIC BYPASS WITH HIATAL HERNIA REPAIR N/A 01/12/2015   Procedure: LAPAROSCOPIC ROUX-EN-Y GASTRIC BYPASS WITH LYSIS OF ADHESIONS ;  Surgeon: Luretha Murphy, MD;  Location: [facility];  Service: General;  Laterality: N/A;  . SPINE SURGERY     C6-C7 fusion x2  . SPINE SURGERY     herniated disc L4-L5  . TUBAL LIGATION      Family History  Problem Relation Age of Onset  . Diabetes Mother   . Heart disease Mother   . Hypertension Mother   . Lung disease Father   . Heart disease Father   . Hypertension Father   . Diabetes  Father   . Hypertension Sister   . Hyperlipidemia Brother   . Hypertension Brother   . Heart attack Maternal Grandmother   . Heart disease Maternal Grandmother   . Hypertension Maternal Grandfather   . Diabetes Maternal Grandfather   . Heart disease Maternal Grandfather     Social History   Socioeconomic History  . Marital status: Married    Spouse name: None  . Number of children: 2  . Years of education: college  . Highest education level: None  Social Needs  . Financial resource strain: None  . Food insecurity - worry: None  . Food insecurity - inability: None  . Transportation needs - medical: None  . Transportation needs - non-medical: None  Occupational History   . Occupation: Corporate treasurer  Tobacco Use  . Smoking status: Former Smoker    Packs/day: 2.50    Years: 8.00    Pack years: 20.00    Types: Cigarettes    Last attempt to quit: 05/18/1983    Years since quitting: 33.7  . Smokeless tobacco: Never Used  Substance and Sexual Activity  . Alcohol use: No    Alcohol/week: 0.0 oz  . Drug use: No  . Sexual activity: No    Birth control/protection: Abstinence  Other Topics Concern  . None  Social History Narrative   Lives with husband and one of her children in a one story home.  Has 2 children.     Works as a Corporate treasurer at [employer].  Education: college.    No current facility-administered medications on file prior to encounter.    Current Outpatient Medications on File Prior to Encounter  Medication Sig Dispense Refill  . BAYER CONTOUR NEXT TEST test strip USE AS INSTRUCTED TO CHECK BLOOD SUGAR THREE TIMES DAILY 100 each prn  . Biotin 5 MG CAPS Take by mouth.    . bisacodyl (DULCOLAX) 5 MG EC tablet bisacodyl 5 mg tablet,delayed release  TAKE AS DIRECTED    . calcium-vitamin D 250-100 MG-UNIT tablet Take 1 tablet by mouth daily.    . cyclobenzaprine (FLEXERIL) 5 MG tablet Take 1 tablet (5 mg total) by mouth 2 (two) times daily. 60 tablet 5  . MAGNESIUM PO Take 600 mg by mouth daily.    . metFORMIN (GLUCOPHAGE) 500 MG tablet TAKE 1 TABLET(500 MG) BY MOUTH TWICE DAILY WITH A MEAL 189 tablet 1  . Multiple Vitamin (MULTIVITAMIN) tablet Take 1 tablet by mouth daily.    . mupirocin nasal ointment (BACTROBAN NASAL) 2 % Apply to each nostril two times daily. 10 g 1  . PROCTOFOAM HC rectal foam APPLY RECTALLY TO THE ANUS TWICE DAILY 10 g prn  . promethazine (PHENERGAN) 25 MG tablet TAKE 1 TABLET(25 MG) BY MOUTH EVERY 8 HOURS AS NEEDED 30 tablet 0  . ramipril (ALTACE) 5 MG capsule TAKE 1 CAPSULE(5 MG) BY MOUTH DAILY 90 capsule 3  . rosuvastatin (CRESTOR) 5 MG tablet TAKE 1 TABLET(5 MG) BY MOUTH DAILY 90 tablet 3  . Topiramate ER (TROKENDI XR)  50 MG CP24 Take 50 mg by mouth daily. 30 capsule 11  . vitamin B-12 (CYANOCOBALAMIN) 1000 MCG tablet Take 1,000 mcg by mouth daily.      Review of Systems: a complete, 10pt review of systems was completed with pertinent positives and negatives as documented in the HPI  Physical Exam: Vitals:   02/01/17 1338 02/01/17 1653  BP: 119/72 121/64  Pulse: (!) 111 87  Resp: 18 16  Temp: 99 F (  37.2 C)   SpO2: 98% 100%   Gen: A&Ox3, no distress Head: normocephalic, atraumatic. Dry mucus membranes Eyes: extraocular motions intact, anicteric.  Neck: supple without mass or thyromegaly Chest: unlabored respirations, symmetrical air entry, clear bilaterally  Cardiovascular: RRR with palpable distal pulses, no pedal edema Abdomen: soft, mildly distended, tender at umbilicus and just to the right of the umbilicus. Multiple well healed scars, no peritonitis. No mass or organomegaly.  Extremities: warm, without edema, no deformities  Neuro: grossly intact Psych: appropriate mood and affect, normal insight  Skin: warm and dry   CBC Latest Ref Rng & Units 02/01/2017 01/26/2017 11/10/2016  WBC 4.0 - 10.5 K/uL 9.1 6.0 4.6  Hemoglobin 12.0 - 15.0 g/dL 16.1 09.6 04.5  Hematocrit 36.0 - 46.0 % 40.4 36.5 35.3  Platelets 150 - 400 K/uL 289 273 250    CMP Latest Ref Rng & Units 02/01/2017 01/26/2017 11/10/2016  Glucose 65 - 99 mg/dL 409(W) 119(J) 478(G)  BUN 6 - 20 mg/dL 95(A) 21(H) 20  Creatinine 0.44 - 1.00 mg/dL 0.86 5.78 4.69  Sodium 135 - 145 mmol/L 137 140 141  Potassium 3.5 - 5.1 mmol/L 3.7 4.4 4.2  Chloride 101 - 111 mmol/L 101 102 106  CO2 22 - 32 mmol/L 22 28 28   Calcium 8.9 - 10.3 mg/dL 9.5 62.9 9.4  Total Protein 6.5 - 8.1 g/dL 7.0 6.9 6.6  Total Bilirubin 0.3 - 1.2 mg/dL 0.8 0.4 0.5  Alkaline Phos 38 - 126 U/L 74 - -  AST 15 - 41 U/L 26 22 24   ALT 14 - 54 U/L 20 17 25     No results found for: INR, PROTIME  Imaging: Ct Abdomen Pelvis W Contrast  Result Date: 02/01/2017 CLINICAL  DATA:  Abdominal pain and periumbilical tenderness with nausea for the past week. Difficulty eating with a subsequent 6 lb weight loss. EXAM: CT ABDOMEN AND PELVIS WITH CONTRAST TECHNIQUE: Multidetector CT imaging of the abdomen and pelvis was performed using the standard protocol following bolus administration of intravenous contrast. CONTRAST:  100 cc Isovue-300 COMPARISON:  Abdomen and pelvis radiograph dated 01/26/2017. FINDINGS: Lower chest: Clear lung bases. Hepatobiliary: No focal liver abnormality is seen. Status post cholecystectomy. No biliary dilatation. Pancreas: Unremarkable. No pancreatic ductal dilatation or surrounding inflammatory changes. Spleen: Normal in size without focal abnormality. Adrenals/Urinary Tract: Adrenal glands are unremarkable. Kidneys are normal, without renal calculi, focal lesion, or hydronephrosis. Bladder is unremarkable. Stomach/Bowel: Post gastric bypass changes. Diffuse wall thickening with mild adjacent soft tissue stranding involving a loop of jejunum in the right lower abdomen and mid abdomen. There are findings suggesting an internal hernia at this location with some swirling of the mesentery. There are also several minimally dilated loops of jejunum. Mildly prominent stool in the colon. Surgically absent appendix. Vascular/Lymphatic: No significant vascular findings are present. No enlarged abdominal or pelvic lymph nodes. Reproductive: Status post hysterectomy. No adnexal masses. Other: Just below the umbilicus and beneath the anterior abdominal wall, there is an oval fat density mass with a thin surrounding rim, measuring 4.1 cm in length on sagittal image number 86 and 4.6 x 2.7 cm on axial image number 56 of series 3. Bilateral pelvic phleboliths and right ovarian vein phleboliths. Tiny amount of free peritoneal fluid. Musculoskeletal: Lumbar and lower thoracic spine degenerative changes. IMPRESSION: 1. Enteritis involving a loop of small bowel in the mid abdomen  and right lower abdomen with a possible associated internal hernia. This is suspicious for ischemic enteritis due to an internal  hernia with some associated mesenteric twisting and possible bowel entrapment. Infectious and inflammatory enteritis are also possibilities. 2. Mildly dilated loops of jejunum, most likely due to obstruction by the loop involved with the enteritis and possible internal hernia. 3. Minimal free peritoneal fluid. 4. 4.6 x 4.1 x 2.7 cm fat density mass in the anterior abdomen just below the umbilicus. This most likely represents an area of fat necrosis. This would be an unusual location for a lipoma. 5. Status post gastric bypass. These results were called by telephone at the time of interpretation on 02/01/2017 at 9:09 pm to [place] ROBINSON, PA-C , who verbally acknowledged these results. Electronically Signed   By: Beckie SaltsSteven  Reid M.D.   On: 02/01/2017 21:09      A/P: 57 year old woman 2 years status post antecolic, antegastric gastric bypass with postprandial abdominal pain, nausea and vomiting, and CT scan concerning for internal hernia/enteritis.  I recommend diagnostic laparoscopy this evening and discussed with the patient possible findings of ischemic bowel, need for conversion to open surgery, possible bowel resection, and although less likely, possible open abdomen with plans for second look surgery depending on findings. We discussed the risks of surgery and all of her questions were answered. We'll proceed to the OR this evening.   Phylliss Blakeshelsea Rashana Andrew, MD [facility], [place] Pager [phone]

## 2017-02-01 NOTE — Anesthesia Preprocedure Evaluation (Signed)
Anesthesia Evaluation  Patient identified by MRN, date of birth, ID band Patient awake    Reviewed: Allergy & Precautions, H&P , Patient's Chart, lab work & pertinent test results  History of Anesthesia Complications (+) PONV and history of anesthetic complications  Airway Mallampati: I  TM Distance: >3 FB Neck ROM: full    Dental no notable dental hx.    Pulmonary sleep apnea , former smoker,    Pulmonary exam normal        Cardiovascular hypertension, Pt. on medications Normal cardiovascular exam  02/2015 Nuclear stress EF: 62%. The study is normal. This is a low risk study. The left ventricular ejection fraction is normal (55-65%).   Neuro/Psych  Headaches, History of vertigo negative psych ROS   GI/Hepatic hiatal hernia,   Endo/Other  diabetes, Oral Hypoglycemic Agents  Renal/GU      Musculoskeletal HLD   Abdominal   Peds  Hematology HLD   Anesthesia Other Findings internal hernia    Reproductive/Obstetrics                             Anesthesia Physical  Anesthesia Plan  ASA: III and emergent  Anesthesia Plan: General   Post-op Pain Management:    Induction: Intravenous and Rapid sequence  PONV Risk Score and Plan: 4 or greater and Scopolamine patch - Pre-op, Midazolam, Dexamethasone, Ondansetron and Treatment may vary due to age or medical condition  Airway Management Planned: Oral ETT  Additional Equipment:   Intra-op Plan:   Post-operative Plan: Extubation in OR  Informed Consent: I have reviewed the patients History and Physical, chart, labs and discussed the procedure including the risks, benefits and alternatives for the proposed anesthesia with the patient or authorized representative who has indicated his/her understanding and acceptance.   Dental Advisory Given and Dental advisory given  Plan Discussed with: CRNA  Anesthesia Plan Comments: ( )         Anesthesia Quick Evaluation

## 2017-02-02 ENCOUNTER — Other Ambulatory Visit: Payer: Self-pay

## 2017-02-02 ENCOUNTER — Encounter (HOSPITAL_COMMUNITY): Payer: Self-pay | Admitting: *Deleted

## 2017-02-02 DIAGNOSIS — K458 Other specified abdominal hernia without obstruction or gangrene: Secondary | ICD-10-CM | POA: Diagnosis present

## 2017-02-02 DIAGNOSIS — E119 Type 2 diabetes mellitus without complications: Secondary | ICD-10-CM | POA: Diagnosis present

## 2017-02-02 DIAGNOSIS — E785 Hyperlipidemia, unspecified: Secondary | ICD-10-CM | POA: Diagnosis present

## 2017-02-02 DIAGNOSIS — Z9889 Other specified postprocedural states: Secondary | ICD-10-CM

## 2017-02-02 DIAGNOSIS — L7634 Postprocedural seroma of skin and subcutaneous tissue following other procedure: Secondary | ICD-10-CM | POA: Diagnosis not present

## 2017-02-02 DIAGNOSIS — Z888 Allergy status to other drugs, medicaments and biological substances status: Secondary | ICD-10-CM | POA: Diagnosis not present

## 2017-02-02 DIAGNOSIS — Z9884 Bariatric surgery status: Secondary | ICD-10-CM | POA: Diagnosis not present

## 2017-02-02 DIAGNOSIS — K565 Intestinal adhesions [bands], unspecified as to partial versus complete obstruction: Secondary | ICD-10-CM | POA: Diagnosis present

## 2017-02-02 DIAGNOSIS — K469 Unspecified abdominal hernia without obstruction or gangrene: Secondary | ICD-10-CM | POA: Diagnosis not present

## 2017-02-02 DIAGNOSIS — Z87891 Personal history of nicotine dependence: Secondary | ICD-10-CM | POA: Diagnosis not present

## 2017-02-02 DIAGNOSIS — Z79899 Other long term (current) drug therapy: Secondary | ICD-10-CM | POA: Diagnosis not present

## 2017-02-02 DIAGNOSIS — Z885 Allergy status to narcotic agent status: Secondary | ICD-10-CM | POA: Diagnosis not present

## 2017-02-02 DIAGNOSIS — I10 Essential (primary) hypertension: Secondary | ICD-10-CM | POA: Diagnosis present

## 2017-02-02 DIAGNOSIS — R112 Nausea with vomiting, unspecified: Secondary | ICD-10-CM | POA: Diagnosis present

## 2017-02-02 DIAGNOSIS — M7989 Other specified soft tissue disorders: Secondary | ICD-10-CM | POA: Diagnosis not present

## 2017-02-02 DIAGNOSIS — K219 Gastro-esophageal reflux disease without esophagitis: Secondary | ICD-10-CM | POA: Diagnosis present

## 2017-02-02 DIAGNOSIS — K654 Sclerosing mesenteritis: Secondary | ICD-10-CM | POA: Diagnosis present

## 2017-02-02 DIAGNOSIS — K660 Peritoneal adhesions (postprocedural) (postinfection): Secondary | ICD-10-CM | POA: Diagnosis not present

## 2017-02-02 DIAGNOSIS — Z7984 Long term (current) use of oral hypoglycemic drugs: Secondary | ICD-10-CM | POA: Diagnosis not present

## 2017-02-02 DIAGNOSIS — G4733 Obstructive sleep apnea (adult) (pediatric): Secondary | ICD-10-CM | POA: Diagnosis present

## 2017-02-02 DIAGNOSIS — K56600 Partial intestinal obstruction, unspecified as to cause: Secondary | ICD-10-CM | POA: Diagnosis not present

## 2017-02-02 LAB — GLUCOSE, CAPILLARY
GLUCOSE-CAPILLARY: 106 mg/dL — AB (ref 65–99)
Glucose-Capillary: 169 mg/dL — ABNORMAL HIGH (ref 65–99)
Glucose-Capillary: 155 mg/dL — ABNORMAL HIGH (ref 65–99)
Glucose-Capillary: 142 mg/dL — ABNORMAL HIGH (ref 65–99)
Glucose-Capillary: 114 mg/dL — ABNORMAL HIGH (ref 65–99)
Glucose-Capillary: 122 mg/dL — ABNORMAL HIGH (ref 65–99)

## 2017-02-02 LAB — CBC
HEMATOCRIT: 35.6 % — AB (ref 36.0–46.0)
HEMOGLOBIN: 11.7 g/dL — AB (ref 12.0–15.0)
MCH: 30 pg (ref 26.0–34.0)
MCHC: 32.9 g/dL (ref 30.0–36.0)
MCV: 91.3 fL (ref 78.0–100.0)
Platelets: 241 10*3/uL (ref 150–400)
RBC: 3.9 MIL/uL (ref 3.87–5.11)
RDW: 13.2 % (ref 11.5–15.5)
WBC: 9.3 10*3/uL (ref 4.0–10.5)

## 2017-02-02 LAB — BASIC METABOLIC PANEL
ANION GAP: 9 (ref 5–15)
BUN: 15 mg/dL (ref 6–20)
CO2: 22 mmol/L (ref 22–32)
Calcium: 8.7 mg/dL — ABNORMAL LOW (ref 8.9–10.3)
Chloride: 107 mmol/L (ref 101–111)
Creatinine, Ser: 0.77 mg/dL (ref 0.44–1.00)
GFR calc Af Amer: >60 mL/min (ref >60)
GFR calc non Af Amer: >60 mL/min (ref >60)
Glucose, Bld: 142 mg/dL — ABNORMAL HIGH (ref 65–99)
POTASSIUM: 4 mmol/L (ref 3.5–5.1)
Sodium: 138 mmol/L (ref 135–145)

## 2017-02-02 LAB — HIV ANTIBODY (ROUTINE TESTING W REFLEX): HIV Screen 4th Generation wRfx: NONREACTIVE

## 2017-02-02 MED ORDER — ONDANSETRON 4 MG PO TBDP: 4.0000 mg | ORAL_TABLET | Freq: Four times a day (QID) | ORAL | Status: DC | PRN

## 2017-02-02 MED ORDER — DIPHENHYDRAMINE HCL 50 MG/ML IJ SOLN: 12.5000 mg | Freq: Four times a day (QID) | INTRAMUSCULAR | Status: DC | PRN

## 2017-02-02 MED ORDER — ONDANSETRON HCL 4 MG/2ML IJ SOLN
INTRAMUSCULAR | Status: AC
  Filled 2017-02-02: qty 2

## 2017-02-02 MED ORDER — SCOPOLAMINE 1 MG/3DAYS TD PT72
1.0000 | MEDICATED_PATCH | TRANSDERMAL | Status: DC
  Administered 2017-02-05: 1.5 mg via TRANSDERMAL
  Filled 2017-02-02 (×2): qty 1

## 2017-02-02 MED ORDER — SUCCINYLCHOLINE CHLORIDE 200 MG/10ML IV SOSY
PREFILLED_SYRINGE | INTRAVENOUS | Status: AC
  Filled 2017-02-02: qty 10

## 2017-02-02 MED ORDER — ACETAMINOPHEN 500 MG PO TABS
1000.0000 mg | ORAL_TABLET | Freq: Four times a day (QID) | ORAL | Status: DC
  Administered 2017-02-02 – 2017-02-05 (×11): 1000 mg via ORAL
  Filled 2017-02-02 (×11): qty 2

## 2017-02-02 MED ORDER — FAMOTIDINE IN NACL 20-0.9 MG/50ML-% IV SOLN
20.0000 mg | Freq: Once | INTRAVENOUS | Status: AC
  Administered 2017-02-02: 20 mg via INTRAVENOUS
  Filled 2017-02-02: qty 50

## 2017-02-02 MED ORDER — ROCURONIUM BROMIDE 10 MG/ML (PF) SYRINGE
PREFILLED_SYRINGE | INTRAVENOUS | Status: AC
  Filled 2017-02-02: qty 5

## 2017-02-02 MED ORDER — SCOPOLAMINE 1 MG/3DAYS TD PT72
MEDICATED_PATCH | TRANSDERMAL | Status: AC
  Filled 2017-02-02: qty 1

## 2017-02-02 MED ORDER — INSULIN ASPART 100 UNIT/ML ~~LOC~~ SOLN
0.0000 [IU] | SUBCUTANEOUS | Status: DC
  Administered 2017-02-02: 3 [IU] via SUBCUTANEOUS
  Administered 2017-02-02 – 2017-02-05 (×2): 2 [IU] via SUBCUTANEOUS
  Filled 2017-02-02 (×54): qty 0.15

## 2017-02-02 MED ORDER — CYCLOBENZAPRINE HCL 10 MG PO TABS
5.0000 mg | ORAL_TABLET | Freq: Three times a day (TID) | ORAL | Status: DC | PRN
  Administered 2017-02-02: 5 mg via ORAL
  Filled 2017-02-02: qty 1

## 2017-02-02 MED ORDER — HYDRALAZINE HCL 20 MG/ML IJ SOLN: 10.0000 mg | INTRAMUSCULAR | Status: DC | PRN

## 2017-02-02 MED ORDER — HEPARIN SODIUM (PORCINE) 5000 UNIT/ML IJ SOLN
5000.0000 [IU] | Freq: Three times a day (TID) | INTRAMUSCULAR | Status: DC
  Administered 2017-02-02 – 2017-02-04 (×7): 5000 [IU] via SUBCUTANEOUS
  Filled 2017-02-02 (×7): qty 1

## 2017-02-02 MED ORDER — OXYCODONE HCL 5 MG PO TABS
5.0000 mg | ORAL_TABLET | ORAL | Status: DC | PRN
  Administered 2017-02-02 (×4): 10 mg via ORAL
  Filled 2017-02-02 (×4): qty 2

## 2017-02-02 MED ORDER — LIDOCAINE 2% (20 MG/ML) 5 ML SYRINGE
INTRAMUSCULAR | Status: AC
  Filled 2017-02-02: qty 5

## 2017-02-02 MED ORDER — BISACODYL 10 MG RE SUPP: 10.0000 mg | Freq: Every day | RECTAL | Status: DC | PRN

## 2017-02-02 MED ORDER — DIPHENHYDRAMINE HCL 12.5 MG/5ML PO ELIX: 12.5000 mg | ORAL_SOLUTION | Freq: Four times a day (QID) | ORAL | Status: DC | PRN

## 2017-02-02 MED ORDER — TRAMADOL HCL 50 MG PO TABS
50.0000 mg | ORAL_TABLET | Freq: Four times a day (QID) | ORAL | Status: DC | PRN
  Administered 2017-02-03 – 2017-02-04 (×3): 50 mg via ORAL
  Filled 2017-02-02 (×3): qty 1

## 2017-02-02 MED ORDER — PROMETHAZINE HCL 25 MG/ML IJ SOLN: 6.2500 mg | INTRAMUSCULAR | Status: DC | PRN

## 2017-02-02 MED ORDER — METOPROLOL TARTRATE 5 MG/5ML IV SOLN: 5.0000 mg | Freq: Four times a day (QID) | INTRAVENOUS | Status: DC | PRN

## 2017-02-02 MED ORDER — ONDANSETRON HCL 4 MG/2ML IJ SOLN: 4.0000 mg | Freq: Four times a day (QID) | INTRAMUSCULAR | Status: DC | PRN

## 2017-02-02 MED ORDER — HYDROMORPHONE HCL 1 MG/ML IJ SOLN
INTRAMUSCULAR | Status: AC
  Filled 2017-02-02: qty 1

## 2017-02-02 MED ORDER — HYDROMORPHONE HCL 1 MG/ML IJ SOLN
0.2500 mg | INTRAMUSCULAR | Status: DC | PRN
  Administered 2017-02-02 (×3): 0.5 mg via INTRAVENOUS
  Administered 2017-02-02: 0.25 mg via INTRAVENOUS

## 2017-02-02 MED ORDER — ONDANSETRON HCL 4 MG/2ML IJ SOLN
INTRAMUSCULAR | Status: DC | PRN
  Administered 2017-02-02: 4 mg via INTRAVENOUS

## 2017-02-02 MED ORDER — FENTANYL CITRATE (PF) 250 MCG/5ML IJ SOLN
INTRAMUSCULAR | Status: AC
  Filled 2017-02-02: qty 5

## 2017-02-02 MED ORDER — SUGAMMADEX SODIUM 200 MG/2ML IV SOLN
INTRAVENOUS | Status: AC
  Filled 2017-02-02: qty 2

## 2017-02-02 MED ORDER — TOPIRAMATE ER 50 MG PO CAP24
50.0000 mg | ORAL_CAPSULE | Freq: Every day | ORAL | Status: DC
  Administered 2017-02-02 – 2017-02-05 (×4): 50 mg via ORAL
  Filled 2017-02-02 (×2): qty 1

## 2017-02-02 MED ORDER — DOCUSATE SODIUM 100 MG PO CAPS
100.0000 mg | ORAL_CAPSULE | Freq: Two times a day (BID) | ORAL | Status: DC
  Administered 2017-02-02 – 2017-02-03 (×4): 100 mg via ORAL
  Filled 2017-02-02 (×5): qty 1

## 2017-02-02 MED ORDER — FENTANYL CITRATE (PF) 100 MCG/2ML IJ SOLN: 25.0000 ug | INTRAMUSCULAR | Status: DC | PRN

## 2017-02-02 MED ORDER — DEXAMETHASONE SODIUM PHOSPHATE 10 MG/ML IJ SOLN
INTRAMUSCULAR | Status: AC
  Filled 2017-02-02: qty 1

## 2017-02-02 MED ORDER — SUGAMMADEX SODIUM 200 MG/2ML IV SOLN
INTRAVENOUS | Status: DC | PRN
  Administered 2017-02-02: 200 mg via INTRAVENOUS

## 2017-02-02 NOTE — Transfer of Care (Signed)
Immediate Anesthesia Transfer of Care Note  Patient: Melissa Benson  Procedure(s) Performed: LAPAROSCOPY Lysis of adhesions (N/A Abdomen) excision of umbilical mass, small bowel resection (N/A Abdomen)  Patient Location: PACU  Anesthesia Type:General  Level of Consciousness: awake, alert  and oriented  Airway & Oxygen Therapy: Patient Spontanous Breathing  Post-op Assessment: Report given to RN and Post -op Vital signs reviewed and stable  Post vital signs: Reviewed and stable  Last Vitals:  Vitals:   02/01/17 1945 02/01/17 2000  BP:  (!) 115/52  Pulse: 80 76  Resp:    Temp:    SpO2: 98% 98%    Last Pain:  Vitals:   02/01/17 1935  TempSrc:   PainSc: 6          Complications: No apparent anesthesia complications

## 2017-02-02 NOTE — Progress Notes (Signed)
[facility] Progress Note  1 Day Post-Op  Subjective: CC: wants foley out Patient states she has some abdominal pain, but pain is better than it was before surgery. Denies nausea or vomiting. Asking when her catheter can come out. Patient is wanting to drink some. No flatus.  VSS.   Objective: Vital signs in last 24 hours: Temp:  [97.7 F (36.5 C)-99 F (37.2 C)] 98.2 F (36.8 C) (01/31 0552) Pulse Rate:  [76-111] 90 (01/31 0552) Resp:  [15-33] 16 (01/31 0552) BP: (115-131)/(52-72) 117/60 (01/31 0552) SpO2:  [96 %-100 %] 100 % (01/31 0552) Last BM Date: 02/01/17  Intake/Output from previous day: 01/30 0701 - 01/31 0700 In: 1475 [I.V.:1400] Out: 200 [Urine:100; Blood:100] Intake/Output this shift: No intake/output data recorded.  PE: Gen:  Alert, NAD, pleasant Card:  Regular rate and rhythm, pedal pulses 2+ BL Pulm:  Normal effort, clear to auscultation bilaterally Abd: Soft, appropriately tender, non-distended, bowel sounds hypoactive, no HSM, dressings intact with small amount blood on midline honeycomb Skin: warm and dry, no rashes  Psych: A&Ox3   Lab Results:  Recent Labs    02/01/17 1349 02/02/17 0707  WBC 9.1 9.3  HGB 13.7 11.7*  HCT 40.4 35.6*  PLT 289 241   BMET Recent Labs    02/01/17 1349 02/02/17 0707  NA 137 138  K 3.7 4.0  CL 101 107  CO2 22 22  GLUCOSE 129* 142*  BUN 24* 15  CREATININE 0.76 0.77  CALCIUM 9.5 8.7*   PT/INR No results for input(s): LABPROT, INR in the last 72 hours. CMP     Component Value Date/Time   NA 138 02/02/2017 0707   K 4.0 02/02/2017 0707   CL 107 02/02/2017 0707   CO2 22 02/02/2017 0707   GLUCOSE 142 (H) 02/02/2017 0707   BUN 15 02/02/2017 0707   CREATININE 0.77 02/02/2017 0707   CREATININE 0.82 01/26/2017 1224   CALCIUM 8.7 (L) 02/02/2017 0707   PROT 7.0 02/01/2017 1349   ALBUMIN 4.3 02/01/2017 1349   AST 26 02/01/2017 1349   ALT 20 02/01/2017 1349   ALKPHOS 74 02/01/2017 1349   BILITOT  0.8 02/01/2017 1349   GFRNONAA >60 02/02/2017 0707   GFRNONAA 79 01/26/2017 1224   GFRAA >60 02/02/2017 0707   GFRAA 92 01/26/2017 1224   Lipase     Component Value Date/Time   LIPASE 27 02/01/2017 1349       Studies/Results: Ct Abdomen Pelvis W Contrast  Result Date: 02/01/2017 CLINICAL DATA:  Abdominal pain and periumbilical tenderness with nausea for the past week. Difficulty eating with a subsequent 6 lb weight loss. EXAM: CT ABDOMEN AND PELVIS WITH CONTRAST TECHNIQUE: Multidetector CT imaging of the abdomen and pelvis was performed using the standard protocol following bolus administration of intravenous contrast. CONTRAST:  100 cc Isovue-300 COMPARISON:  Abdomen and pelvis radiograph dated 01/26/2017. FINDINGS: Lower chest: Clear lung bases. Hepatobiliary: No focal liver abnormality is seen. Status post cholecystectomy. No biliary dilatation. Pancreas: Unremarkable. No pancreatic ductal dilatation or surrounding inflammatory changes. Spleen: Normal in size without focal abnormality. Adrenals/Urinary Tract: Adrenal glands are unremarkable. Kidneys are normal, without renal calculi, focal lesion, or hydronephrosis. Bladder is unremarkable. Stomach/Bowel: Post gastric bypass changes. Diffuse wall thickening with mild adjacent soft tissue stranding involving a loop of jejunum in the right lower abdomen and mid abdomen. There are findings suggesting an internal hernia at this location with some swirling of the mesentery. There are also several minimally dilated loops of jejunum.  Mildly prominent stool in the colon. Surgically absent appendix. Vascular/Lymphatic: No significant vascular findings are present. No enlarged abdominal or pelvic lymph nodes. Reproductive: Status post hysterectomy. No adnexal masses. Other: Just below the umbilicus and beneath the anterior abdominal wall, there is an oval fat density mass with a thin surrounding rim, measuring 4.1 cm in length on sagittal image number 86  and 4.6 x 2.7 cm on axial image number 56 of series 3. Bilateral pelvic phleboliths and right ovarian vein phleboliths. Tiny amount of free peritoneal fluid. Musculoskeletal: Lumbar and lower thoracic spine degenerative changes. IMPRESSION: 1. Enteritis involving a loop of small bowel in the mid abdomen and right lower abdomen with a possible associated internal hernia. This is suspicious for ischemic enteritis due to an internal hernia with some associated mesenteric twisting and possible bowel entrapment. Infectious and inflammatory enteritis are also possibilities. 2. Mildly dilated loops of jejunum, most likely due to obstruction by the loop involved with the enteritis and possible internal hernia. 3. Minimal free peritoneal fluid. 4. 4.6 x 4.1 x 2.7 cm fat density mass in the anterior abdomen just below the umbilicus. This most likely represents an area of fat necrosis. This would be an unusual location for a lipoma. 5. Status post gastric bypass. These results were called by telephone at the time of interpretation on 02/01/2017 at 9:09 pm to [place] ROBINSON, PA-C , who verbally acknowledged these results. Electronically Signed   By: Beckie SaltsSteven  Reid M.D.   On: 02/01/2017 21:09    Anti-infectives: Anti-infectives (From admission, onward)   Start     Dose/Rate Route Frequency Ordered Stop   02/01/17 2200  ceFAZolin (ANCEF) powder 2 g  Status:  Discontinued     2 g Other To Surgery 02/01/17 2146 02/01/17 2159   02/01/17 2200  ceFAZolin (ANCEF) IVPB 2g/100 mL premix     2 g 200 mL/hr over 30 Minutes Intravenous  Once 02/01/17 2159 02/01/17 2309       Assessment/Plan   Hx of Roux-en-Y gastric bypass 2017 Dr. Daphine DeutscherMartin Internal hernia x2 S/P diagnostic laparoscopy, LOA, SBR x1, excision of preperitoneal umbilical mass  - POD#0 - mobilize, OOB, IS - d/c foley - await return of bowel function  FEN: can start bariatric CLD today VTE: SCDs, SQ heparin ID: Ancef periop  LOS: 0 days    Wells GuilesKelly  [place] , [facility] 02/02/2017, 9:10 AM Pager: [phone] Consults: [phone] Mon-Fri 7:00 am-4:30 pm Sat-Sun 7:00 am-11:30 am

## 2017-02-02 NOTE — Anesthesia Postprocedure Evaluation (Signed)
Anesthesia Post Note  Patient: Melissa Benson  Procedure(s) Performed: LAPAROSCOPY Lysis of adhesions (N/A Abdomen) excision of umbilical mass, small bowel resection (N/A Abdomen)     Patient location during evaluation: PACU Anesthesia Type: General Level of consciousness: awake and alert Pain management: pain level controlled Vital Signs Assessment: post-procedure vital signs reviewed and stable Respiratory status: spontaneous breathing, nonlabored ventilation, respiratory function stable and patient connected to nasal cannula oxygen Cardiovascular status: blood pressure returned to baseline and stable Postop Assessment: no apparent nausea or vomiting Anesthetic complications: no    Last Vitals:  Vitals:   02/02/17 0215 02/02/17 0552  BP: 131/62 117/60  Pulse: 89 90  Resp:  16  Temp: 36.8 C 36.8 C  SpO2: 100% 100%    Last Pain:  Vitals:   02/02/17 0642  TempSrc:   PainSc: 5                  Latashia Koch P Lianette Broussard

## 2017-02-02 NOTE — Op Note (Signed)
Operative Note  Melissa Benson  [id number]  [id number]  02/02/2017   Surgeon: Lady Deutscher ConnorMD  Assistant: OR staff  Procedure performed: 1. Diagnostic laparoscopy 2. Laparoscopic lysis of adhesions, 60 minutes  3. Excision of preperitoneal umbilical mass approximately 6 cm in diameter  4. Small bowel resection approximately 10 cm in length  Preop diagnosis: bowel obstruction and internal hernia patient with history of Roux-en-Y gastric bypass Post-op diagnosis/intraop findings: her bypass anatomy was normal, the Peterson defect and the jejunojejunostomy mesenteric defect were both appropriately closed. She had 2 very thick adhesive bands connecting to an umbilical mass and adherent to a loop of small bowel, around these adhesions the small bowel had herniated and twisted. There was also an adhesive band in the right pelvis forming a second internal hernia site through which the bowel had herniated. All bowel appeared viable.   Specimens: umbilical mass, small bowel resection Retained items: no EBL: 50cc Complications: none  Description of procedure: After obtaining informed consent the patient was taken to the operating room and placed supine on operating room table wheregeneral endotracheal anesthesia was initiated, preoperative antibiotics were administered, SCDs applied, Foley inserted and a formal timeout was performed. The patient's abdomen was prepped and draped in usual sterile fashion. Peritoneal access was gained using a Visiport technique in the left upper quadrant and insufflation to 15 mmHg ensued without incident. Gross inspection revealed no evidence of injury from our entry. No free abdominal fluid or ischemic appearing bowel. And there are adhesions of omentum to the right upper quadrant and anterior midline, as well as adhesions of the fatty appendages of the sigmoid colon to the lower midline. Under direct visualization more 5 mm trochars were inserted. The omental  adhesions in the anterior midline were taken down with cautery and sharp dissection and the fatty appendages of the sigmoid colon were taken off the abdominal wall in a similar fashion but some adhesions were left in place lower in the pelvis. The adhesions in the right upper quadrant were left in place. Once the omentum had been cleared, she was noted to have a large preperitoneal umbilical mass which had been noted on CT scan, and to this there were 2 very thick adhesive bands that were stuck to 2 loops of bowel. Between these 2 bands, a long segment of bowel had herniated. The bands were carefully divided layer by layer to release the obstruction. One of these bands were was adherent to the bowel was somewhat indistinguishable from the serosa, and this was examined later in the case and ultimately resected. I then located the terminal ileum and attempted to run the bowel from distal to proximal. I encountered another obstruction due to an adhesive band in the right lower pelvis through which the ileum had herniated. This was sharply divided to release the bowel. The bowel was then once again run from the terminal ileum proximally and no other obstruction was encountered. The jejunojejunostomy was identified and the mesenteric defect was sealed appropriately. The Roux limb was followed up to the gastrojejunostomy and the Petersen defect was examined and found to be appropriately scarred. There was some scarring of the end of the candycane to the area of the jejunojejunostomy, but this did not appear to be causing any sort of problem. The gastrojejunostomy appeared healthy and viable. The biliopancreatic limb was traced proximally and also confirmed to be in normal anatomical position. At this juncture, satisfied that there was no residual obstruction, I elected to make a small  periumbilical midline incision in order to examine the small bowel that I was concerned about from these lysis as well as to remove this  umbilical mass. The mass was excised from the abdominal wall using cautery and handed off for pathological examination. It extruded some oily fluid, likely fat necrosis as noted on CT scan. The small bowel area of concern was brought up into the field and examined, although it did look viable and the serosa appeared to be intact, I felt that there was too much thickening and scar to be very confident of this and so I elected to resect this short segment of bowel. This was accomplished with serial fires of blue load linear cutting staplers. The intervening mesentery was divided over Kelly clamps and ligated with 2-0 silk ties. A 75 mm blue GIA stapler was used to create the anastomosis and the common enterotomy was closed with a TA 60 blue load. A 3-0 silk suture was placed at the apex of the staple line to relieve tension on the anastomosis. The mesenteric defect was closed with interrupted 3-0 silk's. Hemostasis along the staple line was ensured with cautery and suture ligature. The anastomosis was reduced into the abdomen and the midline fascia was closed with a running single strand #1 PDS. The abdomen was then reinsufflated and the fascial closure inspected. It was free of any entrapped structures noted to be airtight and hemostatic. The abdomen was once again surveyed for hemostasis which was satisfactory. The abdomen was then desufflated and all trochars removed. All the skin incisions were closed with staples followed by appropriate dressings. The patient was then awakened, extubated and taken to PACU in stable condition.   All counts were correct at the completion of the case.

## 2017-02-03 LAB — GLUCOSE, CAPILLARY
GLUCOSE-CAPILLARY: 111 mg/dL — AB (ref 65–99)
GLUCOSE-CAPILLARY: 115 mg/dL — AB (ref 65–99)
Glucose-Capillary: 100 mg/dL — ABNORMAL HIGH (ref 65–99)
Glucose-Capillary: 115 mg/dL — ABNORMAL HIGH (ref 65–99)
Glucose-Capillary: 87 mg/dL (ref 65–99)
Glucose-Capillary: 92 mg/dL (ref 65–99)

## 2017-02-03 NOTE — Plan of Care (Signed)
  Progressing Health Behavior/Discharge Planning: Ability to manage health-related needs will improve 02/03/2017 0828 - Progressing by Quentin CornwallMadison, Rishav Rockefeller, RN Clinical Measurements: Ability to maintain clinical measurements within normal limits will improve 02/03/2017 0828 - Progressing by Quentin CornwallMadison, Haile Bosler, RN Will remain free from infection 02/03/2017 0828 - Progressing by Quentin CornwallMadison, Daking Westervelt, RN Diagnostic test results will improve 02/03/2017 0828 - Progressing by Quentin CornwallMadison, Elbert Spickler, RN Respiratory complications will improve 02/03/2017 0828 - Progressing by Quentin CornwallMadison, Konstantinos Cordoba, RN Cardiovascular complication will be avoided 02/03/2017 0828 - Progressing by Quentin CornwallMadison, Millissa Deese, RN Activity: Risk for activity intolerance will decrease 02/03/2017 0828 - Progressing by Quentin CornwallMadison, Caeleb Batalla, RN Nutrition: Adequate nutrition will be maintained 02/03/2017 0828 - Progressing by Quentin CornwallMadison, Nayden Czajka, RN Coping: Level of anxiety will decrease 02/03/2017 0828 - Progressing by Quentin CornwallMadison, Kealy Lewter, RN Elimination: Will not experience complications related to bowel motility 02/03/2017 0828 - Progressing by Quentin CornwallMadison, Raquel Sayres, RN Will not experience complications related to urinary retention 02/03/2017 0828 - Progressing by Quentin CornwallMadison, Calum Cormier, RN Safety: Ability to remain free from injury will improve 02/03/2017 0828 - Progressing by Quentin CornwallMadison, Layken Beg, RN Skin Integrity: Risk for impaired skin integrity will decrease 02/03/2017 0828 - Progressing by Quentin CornwallMadison, Kori Goins, RN

## 2017-02-03 NOTE — Progress Notes (Signed)
  Progress Note: General Surgery Service   Assessment/Plan: Patient Active Problem List   Diagnosis Date Noted  . S/P exploratory laparotomy 02/02/2017  . Internal hernia 02/02/2017  . Chronic daily headache 07/27/2015  . 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 Controlled diabetes mellitus type II without complication (HCC) 11/10/2014  . Obesity 10/15/2012  . Hyperlipidemia 09/26/2012  . Chronic neck pain 12/04/2010  . History of migraine headaches 12/04/2010  . E11.9 06/03/2010  . Hypertension 06/03/2010  . OSA on CPAP 06/03/2010   s/p Procedure(s): LAPAROSCOPY Lysis of adhesions excision of umbilical mass, small bowel resection 02/01/2017 -advance diet -dry dressing to midline -ambulate -saline lock -reexamine wound in pm    LOS: 1 day  Chief Complaint/Subjective: Pain well controlled, oozing from midline incision overnight, +flatus  Objective: Vital signs in last 24 hours: Temp:  [98.4 F (36.9 C)-99 F (37.2 C)] 98.4 F (36.9 C) (02/01 0500) Pulse Rate:  [74-77] 74 (02/01 0500) Resp:  [18] 18 (01/31 1300) BP: (94-113)/(38-55) 94/38 (02/01 0500) SpO2:  [98 %-99 %] 98 % (02/01 0500) Weight:  [78 kg (172 lb)] 78 kg (172 lb) (01/31 1700) Last BM Date: 02/01/17  Intake/Output from previous day: 01/31 0701 - 02/01 0700 In: 1894.2 [P.O.:240; I.V.:1654.2] Out: -  Intake/Output this shift: No intake/output data recorded.  Lungs: CTAB  Cardiovascular: RRR  Abd: soft, attp, drainage from inferior portion of umbilical incision, held pressure for 3 min and replaced bandage  Extremities: no edema  Neuro: AOx4  Lab Results: CBC  Recent Labs    02/01/17 1349 02/02/17 0707  WBC 9.1 9.3  HGB 13.7 11.7*  HCT 40.4 35.6*  PLT 289 241   BMET Recent Labs    02/01/17 1349 02/02/17 0707  NA 137 138  K 3.7 4.0  CL 101 107  CO2 22 22  GLUCOSE 129* 142*  BUN 24* 15  CREATININE 0.76 0.77  CALCIUM 9.5 8.7*   PT/INR No results for input(s):  LABPROT, INR in the last 72 hours. ABG No results for input(s): PHART, HCO3 in the last 72 hours.  Invalid input(s): PCO2, PO2  Studies/Results:  Anti-infectives: Anti-infectives (From admission, onward)   Start     Dose/Rate Route Frequency Ordered Stop   02/01/17 2200  ceFAZolin (ANCEF) powder 2 g  Status:  Discontinued     2 g Other To Surgery 02/01/17 2146 02/01/17 2159   02/01/17 2200  ceFAZolin (ANCEF) IVPB 2g/100 mL premix     2 g 200 mL/hr over 30 Minutes Intravenous  Once 02/01/17 2159 02/01/17 2309      Medications: Scheduled Meds: . acetaminophen  1,000 mg Oral Q6H  . docusate sodium  100 mg Oral BID  . heparin  5,000 Units Subcutaneous Q8H  . insulin aspart  0-15 Units Subcutaneous Q4H  . scopolamine  1 patch Transdermal Q72H  . Topiramate ER  50 mg Oral Daily   Continuous Infusions: PRN Meds:.bisacodyl, cyclobenzaprine, diphenhydrAMINE **OR** diphenhydrAMINE, fentaNYL (SUBLIMAZE) injection, hydrALAZINE, metoprolol tartrate, ondansetron **OR** ondansetron (ZOFRAN) IV, oxyCODONE, traMADol  Rodman PickleLuke Aaron Amunique Neyra, MD Pg# [phone] [facility]

## 2017-02-04 LAB — CBC
HCT: 33.6 % — ABNORMAL LOW (ref 36.0–46.0)
Hemoglobin: 11.1 g/dL — ABNORMAL LOW (ref 12.0–15.0)
MCH: 30.5 pg (ref 26.0–34.0)
MCHC: 33 g/dL (ref 30.0–36.0)
MCV: 92.3 fL (ref 78.0–100.0)
Platelets: 232 10*3/uL (ref 150–400)
RBC: 3.64 MIL/uL — ABNORMAL LOW (ref 3.87–5.11)
RDW: 13.7 % (ref 11.5–15.5)
WBC: 5.4 10*3/uL (ref 4.0–10.5)

## 2017-02-04 LAB — GLUCOSE, CAPILLARY
GLUCOSE-CAPILLARY: 79 mg/dL (ref 65–99)
GLUCOSE-CAPILLARY: 142 mg/dL — AB (ref 65–99)
GLUCOSE-CAPILLARY: 107 mg/dL — AB (ref 65–99)
Glucose-Capillary: 100 mg/dL — ABNORMAL HIGH (ref 65–99)
Glucose-Capillary: 101 mg/dL — ABNORMAL HIGH (ref 65–99)

## 2017-02-04 MED ORDER — POLYETHYLENE GLYCOL 3350 17 G PO PACK
17.0000 g | PACK | Freq: Every day | ORAL | Status: DC
  Administered 2017-02-04 – 2017-02-05 (×2): 17 g via ORAL
  Filled 2017-02-04 (×2): qty 1

## 2017-02-04 MED ORDER — DOCUSATE SODIUM 100 MG PO CAPS
200.0000 mg | ORAL_CAPSULE | Freq: Two times a day (BID) | ORAL | Status: DC
  Administered 2017-02-04 – 2017-02-05 (×3): 200 mg via ORAL
  Filled 2017-02-04 (×3): qty 2

## 2017-02-04 MED ORDER — TRAMADOL HCL 50 MG PO TABS: 50.0000 mg | ORAL_TABLET | Freq: Four times a day (QID) | ORAL | 0 refills | Status: DC | PRN

## 2017-02-04 NOTE — Progress Notes (Signed)
[facility] Progress Note  3 Days Post-Op  Subjective: CC-  Overall doing well. Pain well controlled. Tolerating diet. Denies n/v. Passing flatus, no BM. States that she typically takes colace and some other OTC medication at home to help keep her bowels stay regular. Had some issues with bleeding from wound yesterday. States that it starts to bleed when she ambulates.  Objective: Vital signs in last 24 hours: Temp:  [97.3 F (36.3 C)-99.4 F (37.4 C)] 97.3 F (36.3 C) (02/02 0527) Pulse Rate:  [73-84] 80 (02/02 0527) Resp:  [16-20] 18 (02/02 0527) BP: (107-132)/(54-59) 110/59 (02/02 0527) SpO2:  [100 %] 100 % (02/02 0527) Last BM Date: 02/02/17  Intake/Output from previous day: 02/01 0701 - 02/02 0700 In: 840 [P.O.:840] Out: -  Intake/Output this shift: No intake/output data recorded.  PE: Gen:  Alert, NAD, pleasant HEENT: EOM's intact, pupils equal and round Card:  RRR, no M/G/R heard Pulm:  CTAB, no W/R/R, effort normal Abd: Soft, NT/ND, +BS, midline and lap incisions C/D/I with staples intact and no erythema or drainage, no active bloody drainage from midline incision but dressing saturated with bright red blood Ext:  Calves soft and nontender Psych: A&Ox3   Lab Results:  Recent Labs    02/01/17 1349 02/02/17 0707  WBC 9.1 9.3  HGB 13.7 11.7*  HCT 40.4 35.6*  PLT 289 241   BMET Recent Labs    02/01/17 1349 02/02/17 0707  NA 137 138  K 3.7 4.0  CL 101 107  CO2 22 22  GLUCOSE 129* 142*  BUN 24* 15  CREATININE 0.76 0.77  CALCIUM 9.5 8.7*   PT/INR No results for input(s): LABPROT, INR in the last 72 hours. CMP     Component Value Date/Time   NA 138 02/02/2017 0707   K 4.0 02/02/2017 0707   CL 107 02/02/2017 0707   CO2 22 02/02/2017 0707   GLUCOSE 142 (H) 02/02/2017 0707   BUN 15 02/02/2017 0707   CREATININE 0.77 02/02/2017 0707   CREATININE 0.82 01/26/2017 1224   CALCIUM 8.7 (L) 02/02/2017 0707   PROT 7.0 02/01/2017 1349   ALBUMIN 4.3 02/01/2017 1349   AST 26 02/01/2017 1349   ALT 20 02/01/2017 1349   ALKPHOS 74 02/01/2017 1349   BILITOT 0.8 02/01/2017 1349   GFRNONAA >60 02/02/2017 0707   GFRNONAA 79 01/26/2017 1224   GFRAA >60 02/02/2017 0707   GFRAA 92 01/26/2017 1224   Lipase     Component Value Date/Time   LIPASE 27 02/01/2017 1349       Studies/Results: No results found.  Anti-infectives: Anti-infectives (From admission, onward)   Start     Dose/Rate Route Frequency Ordered Stop   02/01/17 2200  ceFAZolin (ANCEF) powder 2 g  Status:  Discontinued     2 g Other To Surgery 02/01/17 2146 02/01/17 2159   02/01/17 2200  ceFAZolin (ANCEF) IVPB 2g/100 mL premix     2 g 200 mL/hr over 30 Minutes Intravenous  Once 02/01/17 2159 02/01/17 2309       Assessment/Plan Hx of Roux-en-Y gastric bypass 2017 Dr. Daphine DeutscherMartin Internal hernia x2 S/P diagnostic laparoscopy, LOA, SBR x1, excision of preperitoneal umbilical mass 1/30 Dr. Fredricka Bonineonnor - POD#3 - mobilize, OOB, IS - Hold heparin due to bleeding and apply ice pack to wound. Continue diet and add miralax. Continue ambulating. If bleeding stops patient appears ready for discharge. Discharge info and appointment on AVS, rx on chart.  FEN: carb modified diet VTE: hold heparin  due to bleeding, SCDs ID: Ancef periop Foley: out Follow up: 2 week staple removal, and with Dr. Fredricka Bonine   LOS: 2 days    Franne Forts , [facility] 02/04/2017, 9:29 AM Pager: [phone] Consults: [phone] Mon-Fri 7:00 am-4:30 pm Sat-Sun 7:00 am-11:30 am

## 2017-02-04 NOTE — Discharge Instructions (Addendum)
MIDLINE WOUND CARE: - midline dressing to be changed once daily - supplies: sterile saline, gauze, scissors, ABD pads, tape  - remove dressing and all packing carefully, moistening with sterile saline as needed to avoid packing/internal dressing sticking to the wound. - clean edges of skin around the wound with water/gauze, making sure there is no tape debris or leakage left on skin that could cause skin irritation or breakdown. - dampen and clean gauze with sterile saline and pack wound from wound base to skin level, making sure to take note of any possible areas of wound tracking, tunneling and packing appropriately. Wound can be packed loosely. Trim gauze to size if a whole kerlix is not required. - cover wound with a dry ABD pad and secure with tape.  - write the date/time on the dry dressing/tape to better track when the last dressing change occurred. - change dressing as needed if leakage occurs, wound gets contaminated, or patient requests to shower. - patient may shower daily with wound open and following the shower the wound should be dried and a clean dressing placed.    [facility]      [facility] [place] [phone]  OPEN ABDOMINAL SURGERY: POST OP INSTRUCTIONS  Always review your discharge instruction sheet given to you by the facility where your surgery was performed.  IF YOU HAVE DISABILITY OR FAMILY LEAVE FORMS, YOU MUST BRING THEM TO THE OFFICE FOR PROCESSING.  PLEASE DO NOT GIVE THEM TO YOUR DOCTOR.  1. A prescription for pain medication may be given to you upon discharge.  Take your pain medication as prescribed, if needed.  If narcotic pain medicine is not needed, then you may take acetaminophen (Tylenol) or ibuprofen (Advil) as needed. 2. Take your usually prescribed medications unless otherwise directed. 3. If you need a refill on your pain medication, please contact your pharmacy. They will contact our office to request authorization.  Prescriptions will not be filled  after 5pm or on week-ends. 4. You should follow a light diet the first few days after arrival home, such as soup and crackers, pudding, etc.unless your doctor has advised otherwise. A high-fiber, low fat diet can be resumed as tolerated.   Be sure to include lots of fluids daily. Most patients will experience some swelling and bruising on the chest and neck area.  Ice packs will help.  Swelling and bruising can take several days to resolve 5. Most patients will experience some swelling and bruising in the area of the incision. Ice pack will help. Swelling and bruising can take several days to resolve..  6. It is common to experience some constipation if taking pain medication after surgery.  Increasing fluid intake and taking a stool softener will usually help or prevent this problem from occurring.  A mild laxative (Milk of Magnesia or Miralax) should be taken according to package directions if there are no bowel movements after 48 hours. 7.  You may have steri-strips (small skin tapes) in place directly over the incision.  These strips should be left on the skin for 7-10 days.  If your surgeon used skin glue on the incision, you may shower in 24 hours.  The glue will flake off over the next 2-3 weeks.  Any sutures or staples will be removed at the office during your follow-up visit. You may find that a light gauze bandage over your incision may keep your staples from being rubbed or pulled. You may shower and replace the bandage daily. 8. ACTIVITIES:  You may resume regular (light) daily activities beginning the next day--such as daily self-care, walking, climbing stairs--gradually increasing activities as tolerated.  You may have sexual intercourse when it is comfortable.  Refrain from any heavy lifting or straining until approved by your doctor. a. You may drive when you no longer are taking prescription pain medication, you can comfortably wear a seatbelt, and you can safely maneuver your car and apply  brakes b. Return to Work: ___________________________________ 9. You should see your doctor in the office for a follow-up appointment approximately two weeks after your surgery.  Make sure that you call for this appointment within a day or two after you arrive home to insure a convenient appointment time. OTHER INSTRUCTIONS:  _____________________________________________________________ _____________________________________________________________  WHEN TO CALL YOUR DOCTOR: 1. Fever over 101.0 2. Inability to urinate 3. Nausea and/or vomiting 4. Extreme swelling or bruising 5. Continued bleeding from incision. 6. Increased pain, redness, or drainage from the incision. 7. Difficulty swallowing or breathing 8. Muscle cramping or spasms. 9. Numbness or tingling in hands or feet or around lips.  The clinic staff is available to answer your questions during regular business hours.  Please dont hesitate to call and ask to speak to one of the nurses if you have concerns.  For further questions, please visit [email or web address]

## 2017-02-05 LAB — GLUCOSE, CAPILLARY
Glucose-Capillary: 98 mg/dL (ref 65–99)
Glucose-Capillary: 123 mg/dL — ABNORMAL HIGH (ref 65–99)

## 2017-02-05 NOTE — Plan of Care (Signed)
  Adequate for Discharge Skin Integrity: Risk for impaired skin integrity will decrease 02/05/2017 1055 - Adequate for Discharge by Darreld Mcleanox, Delawrence Fridman, RN

## 2017-02-05 NOTE — Discharge Summary (Signed)
[facility] Discharge Summary   Patient ID: Melissa Benson MRN: [id number] DOB/AGE: Jun 22, 1960 57 y.o.  Admit date: 02/01/2017 Discharge date: 02/05/2017  Admitting Diagnosis: Postprandial abdominal pain, nausea and vomiting Possible internal hernia/enteritis  Discharge Diagnosis Patient Active Problem List   Diagnosis Date Noted  . S/P exploratory laparotomy 02/02/2017  . Internal hernia 02/02/2017  . Chronic daily headache 07/27/2015  . 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 Controlled diabetes mellitus type II without complication (HCC) 11/10/2014  . Obesity 10/15/2012  . Hyperlipidemia 09/26/2012  . Chronic neck pain 12/04/2010  . History of migraine headaches 12/04/2010  . E11.9 06/03/2010  . Hypertension 06/03/2010  . OSA on CPAP 06/03/2010    Consultants None  Imaging: No results found.  Procedures Dr. Fredricka Bonineonnor (02/03/16) -  1. Diagnostic laparoscopy 2. Laparoscopic lysis of adhesions, 60 minutes  3. Excision of preperitoneal umbilical mass approximately 6 cm in diameter  4. Small bowel resection approximately 10 cm in length  Hospital Course:  Melissa Benson is a 57yo female PMH h/o Roux-en-Y gastric bypass by Dr. Daphine DeutscherMartin, who [facility] with worsening postprandial abdominal pain, nausea, and vomiting. Pain centered around the umbilicus.  Workup included a CT scan which was concerning for internal hernia although the location is atypical considering her bypass anatomy.  Patient was admitted and underwent procedure listed above. Intraoperatively her bypass anatomy was normal, the Peterson defect and the jejunojejunostomy mesenteric defect were both appropriately closed; she had 2 very thick adhesive bands connecting to an umbilical mass and adherent to a loop of small bowel, around these adhesions the small bowel had herniated and twisted; there was also an adhesive band in the right pelvis forming a second internal hernia site  through which the bowel had herniated. All bowel appeared viable. Tolerated procedure well and was transferred to the floor.  Diet was advanced as tolerated.  Patient was found to have persistent drainage from umbilical incision; initially this was sanguinous but then became serosanguinous. Hemoglobin was stable. Distal staples were removed and fascia was found to be intact, no active bleeding. Wound was packed with wet to dry dressing. On POD3, the patient was voiding well, tolerating diet, ambulating well, pain well controlled, vital signs stable, incisions c/d/i and felt stable for discharge home.  Patient will follow up in our office in 1-2 weeks and knows to call with questions or concerns.  She will call to confirm appointment date/time.    I have personally reviewed the patients medication history on the [place] controlled substance database.    Physical Exam: Gen:  Alert, NAD, pleasant HEENT: EOM's intact, pupils equal and round Card:  RRR, no M/G/R heard Pulm:  CTAB, no W/R/R, effort normal Abd: Soft, NT/ND, +BS, midline incision with trace serosanguinous drainage/no fluctuance or erythema >> distal staples removed, wound packed with wet to dry dressing Ext:  Calves soft and nontender Psych: A&Ox3   Allergies as of 02/05/2017      Reactions   Morphine And Related Nausea And Vomiting   Pt states also caused arm to turn black where medication was administered    Pantoprazole Hives   Tyloxapol Nausea And Vomiting      Medication List    TAKE these medications   BAYER CONTOUR NEXT TEST test strip Generic drug:  glucose blood USE AS INSTRUCTED TO CHECK BLOOD SUGAR THREE TIMES DAILY   Biotin 5 MG Caps Take 5 mg by mouth daily.   bisacodyl 5  MG EC tablet Commonly known as:  DULCOLAX bisacodyl 5 mg tablet daily as needed for contipation   calcium-vitamin D 250-100 MG-UNIT tablet Take 1 tablet by mouth 3 (three) times daily.   cyclobenzaprine 5 MG tablet Commonly known as:   FLEXERIL Take 1 tablet (5 mg total) by mouth 2 (two) times daily. What changed:    when to take this  reasons to take this   fluticasone 50 MCG/ACT nasal spray Commonly known as:  FLONASE Place 1-2 sprays into both nostrils daily as needed for allergies or rhinitis.   MAGNESIUM PO Take 250 mg by mouth 2 (two) times daily.   metFORMIN 500 MG tablet Commonly known as:  GLUCOPHAGE TAKE 1 TABLET(500 MG) BY MOUTH TWICE DAILY WITH A MEAL   multivitamin tablet Take 1 tablet by mouth daily.   PROCTOFOAM HC rectal foam Generic drug:  hydrocortisone-pramoxine APPLY RECTALLY TO THE ANUS TWICE DAILY What changed:  See the new instructions.   promethazine 25 MG tablet Commonly known as:  PHENERGAN TAKE 1 TABLET(25 MG) BY MOUTH EVERY 8 HOURS AS NEEDED What changed:    how much to take  how to take this  when to take this  reasons to take this  additional instructions   ramipril 5 MG capsule Commonly known as:  ALTACE TAKE 1 CAPSULE(5 MG) BY MOUTH DAILY   rosuvastatin 5 MG tablet Commonly known as:  CRESTOR TAKE 1 TABLET(5 MG) BY MOUTH DAILY   Topiramate ER 50 MG Cp24 Commonly known as:  TROKENDI XR Take 50 mg by mouth daily.   traMADol 50 MG tablet Commonly known as:  ULTRAM Take 1 tablet (50 mg total) by mouth every 6 (six) hours as needed for severe pain.   vitamin B-12 1000 MCG tablet Commonly known as:  CYANOCOBALAMIN Take 1,000 mcg by mouth daily.        Follow-up Information    Berna Bue, MD. Call in 1 week(s).   Specialty:  General Surgery Why:  Call to make a follow up appointment with your surgeon. You will need to have your staples removed 10-14 days after surgery. Contact information: [phone]           Signed: Franne Forts, [facility] 02/05/2017, 9:38 AM Pager: [phone] Consults: [phone] Mon-Fri 7:00 am-4:30 pm Sat-Sun 7:00 am-11:30 am

## 2017-03-14 ENCOUNTER — Ambulatory Visit: Payer: BLUE CROSS/BLUE SHIELD | Admitting: Allergy and Immunology

## 2017-03-20 ENCOUNTER — Other Ambulatory Visit: Payer: Self-pay

## 2017-03-20 MED ORDER — METFORMIN HCL 500 MG PO TABS: ORAL_TABLET | ORAL | 3 refills | Status: DC

## 2017-03-20 NOTE — Telephone Encounter (Signed)
Refill x one year °

## 2017-03-20 NOTE — Telephone Encounter (Signed)
Patient called to request a refill on her metformin 500mg  BID.

## 2017-04-05 ENCOUNTER — Ambulatory Visit: Payer: BLUE CROSS/BLUE SHIELD | Admitting: Neurology

## 2017-05-01 ENCOUNTER — Other Ambulatory Visit: Payer: Self-pay | Admitting: Internal Medicine

## 2017-05-01 DIAGNOSIS — E785 Hyperlipidemia, unspecified: Secondary | ICD-10-CM

## 2017-05-01 DIAGNOSIS — I10 Essential (primary) hypertension: Secondary | ICD-10-CM

## 2017-05-01 DIAGNOSIS — E119 Type 2 diabetes mellitus without complications: Secondary | ICD-10-CM

## 2017-05-01 DIAGNOSIS — Z79899 Other long term (current) drug therapy: Secondary | ICD-10-CM

## 2017-05-01 DIAGNOSIS — Z5181 Encounter for therapeutic drug level monitoring: Secondary | ICD-10-CM

## 2017-05-02 ENCOUNTER — Ambulatory Visit: Payer: BLUE CROSS/BLUE SHIELD | Admitting: Internal Medicine

## 2017-05-02 ENCOUNTER — Encounter: Payer: Self-pay | Admitting: Internal Medicine

## 2017-05-02 VITALS — BP 150/82 | HR 80 | Temp 98.5°F | Ht 65.5 in | Wt 170.0 lb

## 2017-05-02 DIAGNOSIS — L03116 Cellulitis of left lower limb: Secondary | ICD-10-CM

## 2017-05-02 DIAGNOSIS — W57XXXA Bitten or stung by nonvenomous insect and other nonvenomous arthropods, initial encounter: Secondary | ICD-10-CM

## 2017-05-02 DIAGNOSIS — S80862A Insect bite (nonvenomous), left lower leg, initial encounter: Secondary | ICD-10-CM | POA: Diagnosis not present

## 2017-05-02 MED ORDER — DOXYCYCLINE HYCLATE 100 MG PO TABS: 100.0000 mg | ORAL_TABLET | Freq: Two times a day (BID) | ORAL | 0 refills | Status: DC

## 2017-05-02 MED ORDER — FLUCONAZOLE 150 MG PO TABS: 150.0000 mg | ORAL_TABLET | Freq: Once | ORAL | 0 refills | Status: AC

## 2017-05-02 MED ORDER — TRIAMCINOLONE ACETONIDE 0.1 % EX CREA: 1.0000 "application " | TOPICAL_CREAM | Freq: Two times a day (BID) | CUTANEOUS | 0 refills | Status: DC

## 2017-05-02 NOTE — Patient Instructions (Signed)
Doxycycline 100 mg twice daily for 7 days.  Triamcinolone cream 0.1% to bite area 3 times daily.

## 2017-05-02 NOTE — Progress Notes (Signed)
   Subjective:    Patient ID: Melissa Benson, female    DOB: 12-Feb-1960, 57 y.o.   MRN: [id number]  HPI 10 days ago she found a small tick left lower anterior leg.  She removed it and saved it.  She has had some headache that she thinks it has something to do with history of migraine.  No fever chills or rash.  She has noticed some erythema about the area of the bite.  Says it is tender.  No drainage.  Has felt warm to touch she says.    Review of Systems some itching at site of bite     Objective:   Physical Exam There is a quarter size lesion left anterior lower leg with central abraded area with surrounding erythema.  Tick was examined and appears to be a small deer tick       Assessment & Plan:  Tick bite left lower extremity with surrounding area of cellulitis  Plan: Doxycycline 100 mg twice daily for 7 days.  Clean area with peroxide.  Apply triamcinolone cream to bite area 3 times a day for itching.  Diflucan if needed for Candida vaginitis while on antibiotics

## 2017-05-12 ENCOUNTER — Other Ambulatory Visit: Payer: BLUE CROSS/BLUE SHIELD | Admitting: Internal Medicine

## 2017-05-12 DIAGNOSIS — E119 Type 2 diabetes mellitus without complications: Secondary | ICD-10-CM | POA: Diagnosis not present

## 2017-05-12 DIAGNOSIS — I10 Essential (primary) hypertension: Secondary | ICD-10-CM

## 2017-05-12 DIAGNOSIS — Z5181 Encounter for therapeutic drug level monitoring: Secondary | ICD-10-CM

## 2017-05-12 DIAGNOSIS — E785 Hyperlipidemia, unspecified: Secondary | ICD-10-CM

## 2017-05-12 DIAGNOSIS — Z79899 Other long term (current) drug therapy: Secondary | ICD-10-CM

## 2017-05-13 LAB — HEPATIC FUNCTION PANEL
AG Ratio: 2.1 (calc) (ref 1.0–2.5)
ALKALINE PHOSPHATASE (APISO): 70 U/L (ref 33–130)
ALT: 19 U/L (ref 6–29)
AST: 21 U/L (ref 10–35)
Albumin: 4.6 g/dL (ref 3.6–5.1)
BILIRUBIN INDIRECT: 0.4 mg/dL (ref 0.2–1.2)
Bilirubin, Direct: 0.1 mg/dL (ref 0.0–0.2)
Globulin: 2.2 g/dL (calc) (ref 1.9–3.7)
TOTAL PROTEIN: 6.8 g/dL (ref 6.1–8.1)
Total Bilirubin: 0.5 mg/dL (ref 0.2–1.2)

## 2017-05-13 LAB — HEMOGLOBIN A1C
EAG (MMOL/L): 7.3 (calc)
HEMOGLOBIN A1C: 6.2 %{Hb} — AB (ref <5.7)
Mean Plasma Glucose: 131 (calc)

## 2017-05-13 LAB — MICROALBUMIN / CREATININE URINE RATIO
CREATININE, URINE: 112 mg/dL (ref 20–275)
MICROALB UR: 0.4 mg/dL
Microalb Creat Ratio: 4 mcg/mg creat (ref <30)

## 2017-05-13 LAB — LIPID PANEL
Cholesterol: 130 mg/dL (ref <200)
HDL: 62 mg/dL (ref >50)
LDL Cholesterol (Calc): 53 mg/dL (calc)
NON-HDL CHOLESTEROL (CALC): 68 mg/dL (ref <130)
Total CHOL/HDL Ratio: 2.1 (calc) (ref <5.0)
Triglycerides: 71 mg/dL (ref <150)

## 2017-05-15 ENCOUNTER — Other Ambulatory Visit: Payer: BLUE CROSS/BLUE SHIELD | Admitting: Internal Medicine

## 2017-05-18 ENCOUNTER — Ambulatory Visit: Payer: BLUE CROSS/BLUE SHIELD | Admitting: Internal Medicine

## 2017-05-18 ENCOUNTER — Encounter: Payer: Self-pay | Admitting: Internal Medicine

## 2017-05-18 VITALS — BP 120/70 | HR 74 | Temp 98.5°F | Ht 65.5 in | Wt 169.0 lb

## 2017-05-18 DIAGNOSIS — Z6827 Body mass index (BMI) 27.0-27.9, adult: Secondary | ICD-10-CM

## 2017-05-18 DIAGNOSIS — Z9884 Bariatric surgery status: Secondary | ICD-10-CM | POA: Diagnosis not present

## 2017-05-18 DIAGNOSIS — E785 Hyperlipidemia, unspecified: Secondary | ICD-10-CM | POA: Diagnosis not present

## 2017-05-18 DIAGNOSIS — R7302 Impaired glucose tolerance (oral): Secondary | ICD-10-CM

## 2017-05-18 DIAGNOSIS — I10 Essential (primary) hypertension: Secondary | ICD-10-CM

## 2017-05-18 NOTE — Progress Notes (Signed)
   Subjective:    Patient ID: Melissa Benson, female    DOB: 02-08-1960, 57 y.o.   MRN: [id number]  HPI 57 year old Female in today for 5-month recheck.  She has a history of impaired glucose tolerance, migraine headaches, status post gastric bypass surgery, hypertension, hyperlipidemia.  Her hemoglobin A1c has increased to 6.2%.  She needs to watch her diet a bit more and try to get some exercise.  She is concerned because 1 of her toenails fell off and on the wound was going underneath.  Likely had some trauma she did not recall.  There is no evidence of infection.    Review of Systems see above-no other new complaints     Objective:   Physical Exam Skin warm and dry.  Nodes none.  Neck is supple without thyromegaly JVD or carotid bruits.  Chest clear to auscultation.  Cardiac exam regular rate and rhythm normal S1 and S2.  Extremities without edema.       Assessment & Plan:  Impaired glucose tolerance-work on diet and exercise and follow-up in 6 months.  Obesity-status post gastric bypass surgery.  Continue to watch diet and exercise-BMI 27.70 and weight is 169 pounds  Hyperlipidemia-lipid panel stable  Essential hypertension-stable on current regimen 120/70  In January she had diagnostic laparoscopy with some adhesions and excision of preperitoneal umbilical mass.  Small bowel resection was approximately 10 cm in length.  She has recovered well.  Preoperative weight in January was 172 pounds and was 176 pounds in November 2018.  Plan: Physical exam to be done November 2019.  Continue same medications.

## 2017-05-18 NOTE — Patient Instructions (Signed)
It was a pleasure to see you today.  Continue diet and exercise efforts and watch glucose.  Return in 6 months for physical exam.  Continue same medications.

## 2017-05-23 ENCOUNTER — Other Ambulatory Visit: Payer: Self-pay | Admitting: Internal Medicine

## 2017-08-21 DIAGNOSIS — E119 Type 2 diabetes mellitus without complications: Secondary | ICD-10-CM | POA: Diagnosis not present

## 2017-08-21 DIAGNOSIS — H2513 Age-related nuclear cataract, bilateral: Secondary | ICD-10-CM | POA: Diagnosis not present

## 2017-08-21 DIAGNOSIS — D23121 Other benign neoplasm of skin of left upper eyelid, including canthus: Secondary | ICD-10-CM | POA: Diagnosis not present

## 2017-08-21 LAB — HM DIABETES EYE EXAM

## 2017-08-25 ENCOUNTER — Encounter: Payer: Self-pay | Admitting: Internal Medicine

## 2017-10-04 DIAGNOSIS — Z23 Encounter for immunization: Secondary | ICD-10-CM | POA: Diagnosis not present

## 2017-11-14 ENCOUNTER — Other Ambulatory Visit: Payer: Self-pay | Admitting: Internal Medicine

## 2017-11-14 DIAGNOSIS — E785 Hyperlipidemia, unspecified: Secondary | ICD-10-CM

## 2017-11-14 DIAGNOSIS — M5417 Radiculopathy, lumbosacral region: Secondary | ICD-10-CM

## 2017-11-14 DIAGNOSIS — Z6828 Body mass index (BMI) 28.0-28.9, adult: Secondary | ICD-10-CM

## 2017-11-14 DIAGNOSIS — R519 Headache, unspecified: Secondary | ICD-10-CM

## 2017-11-14 DIAGNOSIS — R7302 Impaired glucose tolerance (oral): Secondary | ICD-10-CM

## 2017-11-14 DIAGNOSIS — Z0000 Encounter for general adult medical examination without abnormal findings: Secondary | ICD-10-CM

## 2017-11-14 DIAGNOSIS — R51 Headache: Secondary | ICD-10-CM

## 2017-11-14 DIAGNOSIS — E8881 Metabolic syndrome: Secondary | ICD-10-CM

## 2017-11-14 DIAGNOSIS — I10 Essential (primary) hypertension: Secondary | ICD-10-CM

## 2017-11-14 DIAGNOSIS — E119 Type 2 diabetes mellitus without complications: Secondary | ICD-10-CM

## 2017-11-14 DIAGNOSIS — F411 Generalized anxiety disorder: Secondary | ICD-10-CM

## 2017-11-16 ENCOUNTER — Other Ambulatory Visit (INDEPENDENT_AMBULATORY_CARE_PROVIDER_SITE_OTHER): Payer: BLUE CROSS/BLUE SHIELD | Admitting: Internal Medicine

## 2017-11-16 DIAGNOSIS — E119 Type 2 diabetes mellitus without complications: Secondary | ICD-10-CM | POA: Diagnosis not present

## 2017-11-16 DIAGNOSIS — Z6828 Body mass index (BMI) 28.0-28.9, adult: Secondary | ICD-10-CM

## 2017-11-16 DIAGNOSIS — Z0000 Encounter for general adult medical examination without abnormal findings: Secondary | ICD-10-CM | POA: Diagnosis not present

## 2017-11-16 DIAGNOSIS — E785 Hyperlipidemia, unspecified: Secondary | ICD-10-CM

## 2017-11-16 DIAGNOSIS — E8881 Metabolic syndrome: Secondary | ICD-10-CM

## 2017-11-16 DIAGNOSIS — R51 Headache: Secondary | ICD-10-CM

## 2017-11-16 DIAGNOSIS — R7302 Impaired glucose tolerance (oral): Secondary | ICD-10-CM

## 2017-11-16 DIAGNOSIS — I10 Essential (primary) hypertension: Secondary | ICD-10-CM | POA: Diagnosis not present

## 2017-11-16 DIAGNOSIS — M5417 Radiculopathy, lumbosacral region: Secondary | ICD-10-CM

## 2017-11-16 DIAGNOSIS — R519 Headache, unspecified: Secondary | ICD-10-CM

## 2017-11-16 DIAGNOSIS — F411 Generalized anxiety disorder: Secondary | ICD-10-CM

## 2017-11-16 LAB — POCT URINALYSIS DIPSTICK
APPEARANCE: NEGATIVE
Bilirubin, UA: NEGATIVE
Blood, UA: NEGATIVE
GLUCOSE UA: NEGATIVE
Ketones, UA: NEGATIVE
LEUKOCYTES UA: NEGATIVE
NITRITE UA: NEGATIVE
Odor: NEGATIVE
PROTEIN UA: NEGATIVE
SPEC GRAV UA: 1.01 (ref 1.010–1.025)
Urobilinogen, UA: 0.2 E.U./dL
pH, UA: 7.5 (ref 5.0–8.0)

## 2017-11-16 NOTE — Addendum Note (Signed)
Addended by: Gregery NaVALENCIA, Ramondo Dietze P on: 11/16/2017 09:13 AM   Modules accepted: Orders

## 2017-11-17 LAB — LIPID PANEL
CHOL/HDL RATIO: 2.1 (calc) (ref <5.0)
Cholesterol: 112 mg/dL (ref <200)
HDL: 53 mg/dL (ref >50)
LDL CHOLESTEROL (CALC): 43 mg/dL
NON-HDL CHOLESTEROL (CALC): 59 mg/dL (ref <130)
Triglycerides: 80 mg/dL (ref <150)

## 2017-11-17 LAB — COMPLETE METABOLIC PANEL WITH GFR
AG RATIO: 1.6 (calc) (ref 1.0–2.5)
ALT: 15 U/L (ref 6–29)
AST: 19 U/L (ref 10–35)
Albumin: 4.1 g/dL (ref 3.6–5.1)
Alkaline phosphatase (APISO): 65 U/L (ref 33–130)
BUN: 15 mg/dL (ref 7–25)
CO2: 29 mmol/L (ref 20–32)
Calcium: 9.4 mg/dL (ref 8.6–10.4)
Chloride: 106 mmol/L (ref 98–110)
Creat: 0.65 mg/dL (ref 0.50–1.05)
GFR, EST NON AFRICAN AMERICAN: 99 mL/min/{1.73_m2} (ref >=60)
GFR, Est African American: 114 mL/min/{1.73_m2} (ref >=60)
GLOBULIN: 2.5 g/dL (ref 1.9–3.7)
GLUCOSE: 130 mg/dL — AB (ref 65–99)
POTASSIUM: 4.4 mmol/L (ref 3.5–5.3)
SODIUM: 142 mmol/L (ref 135–146)
Total Bilirubin: 0.5 mg/dL (ref 0.2–1.2)
Total Protein: 6.6 g/dL (ref 6.1–8.1)

## 2017-11-17 LAB — MICROALBUMIN / CREATININE URINE RATIO
CREATININE, URINE: 41 mg/dL (ref 20–275)
Microalb Creat Ratio: 5 mcg/mg creat (ref <30)
Microalb, Ur: 0.2 mg/dL

## 2017-11-17 LAB — CBC WITH DIFFERENTIAL/PLATELET
BASOS PCT: 0.5 %
Basophils Absolute: 20 cells/uL (ref 0–200)
EOS ABS: 59 {cells}/uL (ref 15–500)
Eosinophils Relative: 1.5 %
HEMATOCRIT: 35.8 % (ref 35.0–45.0)
HEMOGLOBIN: 12.2 g/dL (ref 11.7–15.5)
LYMPHS ABS: 1209 {cells}/uL (ref 850–3900)
MCH: 31 pg (ref 27.0–33.0)
MCHC: 34.1 g/dL (ref 32.0–36.0)
MCV: 90.9 fL (ref 80.0–100.0)
MPV: 9.8 fL (ref 7.5–12.5)
Monocytes Relative: 7.9 %
NEUTROS ABS: 2305 {cells}/uL (ref 1500–7800)
Neutrophils Relative %: 59.1 %
PLATELETS: 261 10*3/uL (ref 140–400)
RBC: 3.94 10*6/uL (ref 3.80–5.10)
RDW: 12.8 % (ref 11.0–15.0)
TOTAL LYMPHOCYTE: 31 %
WBC: 3.9 10*3/uL (ref 3.8–10.8)
WBCMIX: 308 {cells}/uL (ref 200–950)

## 2017-11-17 LAB — VITAMIN D 25 HYDROXY (VIT D DEFICIENCY, FRACTURES): Vit D, 25-Hydroxy: 51 ng/mL (ref 30–100)

## 2017-11-17 LAB — HEMOGLOBIN A1C
EAG (MMOL/L): 8.2 (calc)
HEMOGLOBIN A1C: 6.8 %{Hb} — AB (ref <5.7)
MEAN PLASMA GLUCOSE: 148 (calc)

## 2017-11-17 LAB — TSH: TSH: 0.5 mIU/L (ref 0.40–4.50)

## 2017-11-21 ENCOUNTER — Ambulatory Visit (INDEPENDENT_AMBULATORY_CARE_PROVIDER_SITE_OTHER): Payer: BLUE CROSS/BLUE SHIELD | Admitting: Internal Medicine

## 2017-11-21 ENCOUNTER — Other Ambulatory Visit: Payer: Self-pay | Admitting: Internal Medicine

## 2017-11-21 ENCOUNTER — Encounter: Payer: Self-pay | Admitting: Internal Medicine

## 2017-11-21 VITALS — BP 110/70 | HR 73 | Temp 98.4°F | Ht 65.5 in | Wt 186.0 lb

## 2017-11-21 DIAGNOSIS — Z803 Family history of malignant neoplasm of breast: Secondary | ICD-10-CM | POA: Insufficient documentation

## 2017-11-21 DIAGNOSIS — I10 Essential (primary) hypertension: Secondary | ICD-10-CM | POA: Diagnosis not present

## 2017-11-21 DIAGNOSIS — E785 Hyperlipidemia, unspecified: Secondary | ICD-10-CM

## 2017-11-21 DIAGNOSIS — F439 Reaction to severe stress, unspecified: Secondary | ICD-10-CM

## 2017-11-21 DIAGNOSIS — K760 Fatty (change of) liver, not elsewhere classified: Secondary | ICD-10-CM

## 2017-11-21 DIAGNOSIS — R7302 Impaired glucose tolerance (oral): Secondary | ICD-10-CM | POA: Diagnosis not present

## 2017-11-21 DIAGNOSIS — Z9884 Bariatric surgery status: Secondary | ICD-10-CM

## 2017-11-21 DIAGNOSIS — Z0000 Encounter for general adult medical examination without abnormal findings: Secondary | ICD-10-CM

## 2017-11-21 DIAGNOSIS — Z79899 Other long term (current) drug therapy: Secondary | ICD-10-CM

## 2017-11-21 DIAGNOSIS — R109 Unspecified abdominal pain: Secondary | ICD-10-CM

## 2017-11-21 DIAGNOSIS — Z87440 Personal history of urinary (tract) infections: Secondary | ICD-10-CM

## 2017-11-21 DIAGNOSIS — E8881 Metabolic syndrome: Secondary | ICD-10-CM | POA: Diagnosis not present

## 2017-11-21 DIAGNOSIS — E119 Type 2 diabetes mellitus without complications: Secondary | ICD-10-CM | POA: Insufficient documentation

## 2017-11-21 DIAGNOSIS — F411 Generalized anxiety disorder: Secondary | ICD-10-CM

## 2017-11-21 DIAGNOSIS — Z5181 Encounter for therapeutic drug level monitoring: Secondary | ICD-10-CM

## 2017-11-21 MED ORDER — PROMETHAZINE HCL 25 MG PO TABS: 25.0000 mg | ORAL_TABLET | Freq: Three times a day (TID) | ORAL | 5 refills | Status: DC | PRN

## 2017-11-21 NOTE — Progress Notes (Signed)
Subjective:    Patient ID: Melissa Benson, female    DOB: 19-Feb-1960, 57 y.o.   MRN: [id number]  HPI 58 year old Female for health maintenance exam and evaluation of medical issues.  In January, had surgery for lysis of adhesions. In 2017 had gastric bypass surgery. Lost about 100 pounds.  History of diabetes mellitus, hyperlipidemia, sleep apnea, hypertension, migraine headaches, paresthesias in feet secondary to lumbar disc disease.  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  History of recurrent urinary infections.  History of chronic neck pain.  History of depression.  Sees Dr. Lucianne Muss for diabetic management.  Gastric bypass surgery done 2017.  Reports having headaches beginning in her late 30s.  Reported a nearly daily headache 5 times a week for some 20 years but that improved maybe 2 or 3 times a week.  She tried Topamax.  CT of the head in 2017 was normal.  She stopped Topamax because she thought it made her feel jittery.  History of elevated liver enzymes.  We advised stop taking Tylenol and NSAIDs.  She continued to have headaches despite stopping analgesic medications but liver functions remain normal.  Has had mid back pain around the level of her shoulder blades treated with Flexeril.  EMG in 2014 showed a mild L5 radiculopathy.  In October 2017 she had EMG of right upper and lower extremities.  These findings were consistent with a very mild old intraspinal canal lesion consistent with radiculopathy affecting the right L5 nerve segment.  She has been a patient here since 2006.  At that time she weighed 251 pounds.  She weighed 260 pounds in 2009.  Hemoglobin A1c was 9.1% in 2009.  In 2010 she was started on insulin.  In 2014 she lost down to 223 pounds.  In 2017 after gastric bypass surgery she weighed 173 pounds.  In January 2017 she was found to have T wave inversions on EKG with family history of coronary artery disease in her mother.  She was sent to  cardiologist and had undergone apparently a nuclear stress test in 2013.  She also had a normal nuclear medicine study February 2017.     Review of Systems situational stress with mother. Under Hospice care but still living at home and pt visits twice daily. Pt works full time job.  Abdominal pain- hurts after eating in lower abdomen and is to see Dr. Ewing Schlein tomorrow and is planning on colonoscopy soon.  Last colonoscopy was 2012 showing no adenomatous polyps.  . Depression score 20 -used to see Dr. Nolen Mu and is not really wanting therapy at this time.  Mammogram through Dr. Dareen Piano, GYN. Seen in early 2019. Hx hysterectomy.  BP stable on current regimen  Social history: She is married and works as a Haematologist.  She has 2 sons.  Family history: History of diabetes mellitus in father.  Mother with history of heart disease and Alzheimer's disease.  3 brothers all of them have substance abuse problems.  2 sisters-1 of whom is a diabetic and who is had gastric sleeve surgery with history of breast cancer.  One brother has history of stroke perhaps related to substance abuse according to the patient.    Objective:   Physical Exam  Constitutional: She appears well-developed and well-nourished.  Non-toxic appearance. She does not appear ill.  HENT:  Head: Normocephalic and atraumatic.  Mouth/Throat: Oropharynx is clear and moist. No oropharyngeal exudate.  Eyes: Pupils are equal, round, and reactive to light. EOM  are normal. No scleral icterus.  Cardiovascular: Normal rate, regular rhythm and normal heart sounds.  Pulmonary/Chest: Effort normal and breath sounds normal. No respiratory distress. She has no wheezes. She has no rales.  Abdominal: Soft. Normal appearance and bowel sounds are normal. She exhibits no distension, no ascites, no pulsatile midline mass and no mass. There is no rebound.  Some mild tenderness in the lower abdomen without rebound  Genitourinary:  Genitourinary  Comments: Deferred to GYN  Skin: Skin is warm and dry.  Psychiatric:  Affect is a bit flat.  Clearly has situational stress.  Vitals reviewed.  Weight 186 pounds was 176 pounds last year at this time.       Assessment & Plan:  Type 2 diabetes mellitus-hemoglobin A1c 6.8% in 6 months ago 6.2%  Situational stress- patient trying to work full-time and take care of her mother which is clearly been stressful.  Lower abdominal pain-?  Functional related to stress or other etiology.  Dr. Ewing SchleinMagod is seen patient  Hyperlipidemia-lipid panel normal-stable on low-dose Crestor  Sleep apnea  Essential hypertension stable on treatment-low-dose ramipril  History of migraine headaches  Paresthesias in feet secondary to lumbar disc disease  History of gastric bypass surgery  Plan: Continue with recommendations per Dr. Ewing SchleinMagod.  Advised patient to try to get rest and take care of herself as much as possible and return in 6 months.  No changes in regimen at this point.  Patient has discontinued Topamax for migraines

## 2017-11-21 NOTE — Patient Instructions (Addendum)
It was a pleasure to see you today.  Continue to work on diet.  Follow-up in 6 months with lipid panel liver functions and hemoglobin A1c.  Consider counseling for situational stress.  Flu vaccine given at work.  Follow-up with Dr. Ewing SchleinMagod regarding abdominal pain and routine colonoscopy.

## 2017-11-22 DIAGNOSIS — R1013 Epigastric pain: Secondary | ICD-10-CM | POA: Diagnosis not present

## 2017-11-28 ENCOUNTER — Emergency Department (HOSPITAL_COMMUNITY)
Admission: EM | Admit: 2017-11-28 | Discharge: 2017-11-29 | Disposition: A | Discharge status: Home / Self Care | Payer: BLUE CROSS/BLUE SHIELD | Attending: Emergency Medicine | Admitting: Emergency Medicine

## 2017-11-28 ENCOUNTER — Emergency Department (HOSPITAL_COMMUNITY): Payer: BLUE CROSS/BLUE SHIELD

## 2017-11-28 ENCOUNTER — Encounter (HOSPITAL_COMMUNITY): Payer: Self-pay

## 2017-11-28 ENCOUNTER — Other Ambulatory Visit: Payer: Self-pay

## 2017-11-28 DIAGNOSIS — M79601 Pain in right arm: Secondary | ICD-10-CM | POA: Diagnosis not present

## 2017-11-28 DIAGNOSIS — Y939 Activity, unspecified: Secondary | ICD-10-CM | POA: Insufficient documentation

## 2017-11-28 DIAGNOSIS — S22000A Wedge compression fracture of unspecified thoracic vertebra, initial encounter for closed fracture: Secondary | ICD-10-CM

## 2017-11-28 DIAGNOSIS — S161XXA Strain of muscle, fascia and tendon at neck level, initial encounter: Secondary | ICD-10-CM | POA: Diagnosis not present

## 2017-11-28 DIAGNOSIS — Z87891 Personal history of nicotine dependence: Secondary | ICD-10-CM | POA: Insufficient documentation

## 2017-11-28 DIAGNOSIS — E119 Type 2 diabetes mellitus without complications: Secondary | ICD-10-CM | POA: Diagnosis not present

## 2017-11-28 DIAGNOSIS — Y92002 Bathroom of unspecified non-institutional (private) residence single-family (private) house as the place of occurrence of the external cause: Secondary | ICD-10-CM | POA: Insufficient documentation

## 2017-11-28 DIAGNOSIS — S22020A Wedge compression fracture of second thoracic vertebra, initial encounter for closed fracture: Secondary | ICD-10-CM | POA: Diagnosis not present

## 2017-11-28 DIAGNOSIS — S29012A Strain of muscle and tendon of back wall of thorax, initial encounter: Secondary | ICD-10-CM | POA: Diagnosis not present

## 2017-11-28 DIAGNOSIS — S199XXA Unspecified injury of neck, initial encounter: Secondary | ICD-10-CM | POA: Diagnosis not present

## 2017-11-28 DIAGNOSIS — M549 Dorsalgia, unspecified: Secondary | ICD-10-CM | POA: Diagnosis not present

## 2017-11-28 DIAGNOSIS — W19XXXA Unspecified fall, initial encounter: Secondary | ICD-10-CM | POA: Diagnosis not present

## 2017-11-28 DIAGNOSIS — R55 Syncope and collapse: Secondary | ICD-10-CM | POA: Insufficient documentation

## 2017-11-28 DIAGNOSIS — Z79899 Other long term (current) drug therapy: Secondary | ICD-10-CM | POA: Diagnosis not present

## 2017-11-28 DIAGNOSIS — I10 Essential (primary) hypertension: Secondary | ICD-10-CM | POA: Diagnosis not present

## 2017-11-28 DIAGNOSIS — Y999 Unspecified external cause status: Secondary | ICD-10-CM | POA: Insufficient documentation

## 2017-11-28 DIAGNOSIS — M5410 Radiculopathy, site unspecified: Secondary | ICD-10-CM | POA: Diagnosis not present

## 2017-11-28 DIAGNOSIS — S29019A Strain of muscle and tendon of unspecified wall of thorax, initial encounter: Secondary | ICD-10-CM

## 2017-11-28 DIAGNOSIS — M792 Neuralgia and neuritis, unspecified: Secondary | ICD-10-CM

## 2017-11-28 DIAGNOSIS — M542 Cervicalgia: Secondary | ICD-10-CM | POA: Diagnosis not present

## 2017-11-28 DIAGNOSIS — Z7984 Long term (current) use of oral hypoglycemic drugs: Secondary | ICD-10-CM | POA: Insufficient documentation

## 2017-11-28 DIAGNOSIS — S299XXA Unspecified injury of thorax, initial encounter: Secondary | ICD-10-CM | POA: Diagnosis not present

## 2017-11-28 DIAGNOSIS — M47812 Spondylosis without myelopathy or radiculopathy, cervical region: Secondary | ICD-10-CM | POA: Diagnosis not present

## 2017-11-28 LAB — CBC
HCT: 41.3 % (ref 36.0–46.0)
Hemoglobin: 13 g/dL (ref 12.0–15.0)
MCH: 29.8 pg (ref 26.0–34.0)
MCHC: 31.5 g/dL (ref 30.0–36.0)
MCV: 94.7 fL (ref 80.0–100.0)
PLATELETS: 307 10*3/uL (ref 150–400)
RBC: 4.36 MIL/uL (ref 3.87–5.11)
RDW: 12.8 % (ref 11.5–15.5)
WBC: 9.7 10*3/uL (ref 4.0–10.5)
nRBC: 0 % (ref 0.0–0.2)

## 2017-11-28 LAB — BASIC METABOLIC PANEL
ANION GAP: 7 (ref 5–15)
BUN: 19 mg/dL (ref 6–20)
CALCIUM: 9.6 mg/dL (ref 8.9–10.3)
CO2: 26 mmol/L (ref 22–32)
Chloride: 105 mmol/L (ref 98–111)
Creatinine, Ser: 0.8 mg/dL (ref 0.44–1.00)
GFR calc Af Amer: >60 mL/min (ref >60)
GLUCOSE: 196 mg/dL — AB (ref 70–99)
POTASSIUM: 4.1 mmol/L (ref 3.5–5.1)
SODIUM: 138 mmol/L (ref 135–145)

## 2017-11-28 LAB — URINALYSIS, ROUTINE W REFLEX MICROSCOPIC
BILIRUBIN URINE: NEGATIVE
Glucose, UA: NEGATIVE mg/dL
HGB URINE DIPSTICK: NEGATIVE
Ketones, ur: NEGATIVE mg/dL
Leukocytes, UA: NEGATIVE
NITRITE: NEGATIVE
PROTEIN: NEGATIVE mg/dL
Specific Gravity, Urine: 1.028 (ref 1.005–1.030)
pH: 5 (ref 5.0–8.0)

## 2017-11-28 MED ORDER — HYDROCODONE-ACETAMINOPHEN 5-325 MG PO TABS
2.0000 | ORAL_TABLET | Freq: Once | ORAL | Status: AC
  Administered 2017-11-28: 2 via ORAL
  Filled 2017-11-28: qty 2

## 2017-11-28 MED ORDER — LORAZEPAM 2 MG/ML IJ SOLN
1.0000 mg | Freq: Once | INTRAMUSCULAR | Status: AC
  Administered 2017-11-28: 1 mg via INTRAVENOUS
  Filled 2017-11-28: qty 1

## 2017-11-28 MED ORDER — GADOBUTROL 1 MMOL/ML IV SOLN
10.0000 mL | Freq: Once | INTRAVENOUS | Status: AC | PRN
  Administered 2017-11-28: 10 mL via INTRAVENOUS

## 2017-11-28 MED ORDER — ONDANSETRON 4 MG PO TBDP
4.0000 mg | ORAL_TABLET | Freq: Once | ORAL | Status: AC
  Administered 2017-11-28: 4 mg via ORAL
  Filled 2017-11-28: qty 1

## 2017-11-28 MED ORDER — METHOCARBAMOL 750 MG PO TABS: 750.0000 mg | ORAL_TABLET | Freq: Three times a day (TID) | ORAL | 0 refills | Status: DC | PRN

## 2017-11-28 MED ORDER — TRAMADOL HCL 50 MG PO TABS
50.0000 mg | ORAL_TABLET | Freq: Once | ORAL | Status: AC
  Administered 2017-11-28: 50 mg via ORAL
  Filled 2017-11-28: qty 1

## 2017-11-28 MED ORDER — SODIUM CHLORIDE 0.9 % IV BOLUS
1000.0000 mL | Freq: Once | INTRAVENOUS | Status: AC
  Administered 2017-11-28: 1000 mL via INTRAVENOUS

## 2017-11-28 MED ORDER — TRAMADOL HCL 50 MG PO TABS: 50.0000 mg | ORAL_TABLET | Freq: Four times a day (QID) | ORAL | 0 refills | Status: DC | PRN

## 2017-11-28 NOTE — ED Provider Notes (Addendum)
Patient placed in Quick Look pathway, seen and evaluated   Chief Complaint: Syncope  HPI:   57 year old female presents today status post syncopal episode.  Patient notes that she was prepping for colonoscopy.  She drank some magnesium citrate and felt nauseous.  She went and sat on the toilet when she stood up she said her vision went away and she passed out.  She awoke on the ground leaning up against the door.  She notes pain in her posterior neck and anterior chest wall.  She notes history of prior cervical surgeries.  Patient denies any neurological deficits.  ROS: Neck pain (one)  Physical Exam:   Gen: No distress  Neuro: Awake and Alert  Skin: Warm    Focused Exam: Tenderness palpation of the lower cervical spinous process and right lateral musculature, tenderness palpation of the upper thoracic spinous process-minor anterior chest wall tenderness-lung expansion normal   Initiation of care has begun. The patient has been counseled on the process, plan, and necessity for staying for the completion/evaluation, and the remainder of the medical screening examination      Eyvonne MechanicHedges, Asaf Elmquist, PA-C 11/28/17 1355    Melene PlanFloyd, Dan, DO 11/28/17 [phone]

## 2017-11-28 NOTE — ED Notes (Signed)
Pt in CT, will be brought to room when CT done

## 2017-11-28 NOTE — Discharge Instructions (Addendum)
It was our pleasure to provide your ER care today - we hope that you feel better.  The mri shows a T2 compression fracture and ligament sprain/strain. Wear the cervical/thoracic brace.   Take motrin or aleve as need for pain. You may also take ultram as need for pain - no driving for the next 6 hours, or when taking ultram.  You may try robaxin as need for muscle pain/spasm - no driving when taking.   Follow up with spine specialist/neurosurgeon in their office in the next 1-2 weeks - call office tomorrow morning to arrange appointment time.   For fainting episode, follow up with primary care doctor in the coming week.  Return to ER if worse, new symptoms, fevers, new or severe pain, new numbness/weakness, weak/fainting, chest pain, trouble breathing, other concern.

## 2017-11-28 NOTE — ED Notes (Signed)
Pt given sandwich and drink.

## 2017-11-28 NOTE — ED Provider Notes (Signed)
[facility] EMERGENCY DEPARTMENT Provider Note   CSN: [id number] Arrival date & time: 11/28/17  1327     History   Chief Complaint Chief Complaint  Patient presents with  . Loss of Consciousness    HPI Melissa Benson is a 57 y.o. female.  Patient was taking mag citrate for prep for colonoscopy when she felt acutely nauseous.  She said she went to the bathroom and was sitting on the toilet but could not throw up.  She was getting up and felt acutely lightheaded and then passed out falling back and striking her head.  She is complaining of 9 out of 10 posterior neck pain radiating down her arms and to her mid back.  Its associated with some burning and tingling of her right thumb.  No chest pain no shortness of breath no bowel or bladder incontinence.  She had a prior neck fusion and has not had neck problems since then.  The history is provided by the patient.  Loss of Consciousness   This is a new problem. The current episode started 1 to 2 hours ago. The problem has been resolved. She lost consciousness for a period of less than one minute. The problem is associated with bowel movements. Associated symptoms include back pain and nausea. Pertinent negatives include abdominal pain, bowel incontinence, chest pain, fever, focal weakness, headaches, visual change, vomiting and weakness. She has tried nothing for the symptoms. The treatment provided no relief.    Past Medical History:  Diagnosis Date  . Complication of anesthesia   . Diabetes mellitus   . GERD (gastroesophageal reflux disease)   . Headache    no issues since stop caffeine  . History of hiatal hernia   . History of migraine headaches   . History of urinary tract infection    last one 11/2014   . History of vertigo   . Hyperlipidemia   . Hypertension   . Neck pain, chronic   . OSA (obstructive sleep apnea)    on CPAP  . PONV (postoperative nausea and vomiting)   . Psoriasis   . Rosacea   .  Weak urinary stream     Patient Active Problem List   Diagnosis Date Noted  . Family history of breast cancer 11/21/2017  . Diabetes mellitus (HCC) 11/21/2017  . S/P exploratory laparotomy 02/02/2017  . Internal hernia 02/02/2017  . Chronic daily headache 07/27/2015  . 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 Controlled diabetes mellitus type II without complication (HCC) 11/10/2014  . Obesity 10/15/2012  . Hyperlipidemia 09/26/2012  . Chronic neck pain 12/04/2010  . History of migraine headaches 12/04/2010  . E11.9 06/03/2010  . Hypertension 06/03/2010  . OSA on CPAP 06/03/2010    Past Surgical History:  Procedure Laterality Date  . 2D ECHOCARDIOGRAM  07/20/2011   EF >55%, normal  . ABDOMINAL HYSTERECTOMY    . APPENDECTOMY    . CARDIOVASCULAR STRESS TEST  07/20/2011   Normal myocardial perfusion study, EKG negative for ischemia, no ECG changes  . CHOLECYSTECTOMY    . colonscopy      polyps removed   . DIAGNOSTIC LAPAROSCOPY    . DILATION AND CURETTAGE OF UTERUS    . LAPAROSCOPIC ROUX-EN-Y GASTRIC BYPASS WITH HIATAL HERNIA REPAIR N/A 01/12/2015   Procedure: LAPAROSCOPIC ROUX-EN-Y GASTRIC BYPASS WITH LYSIS OF ADHESIONS ;  Surgeon: Luretha Murphy, MD;  Location: [facility] ORS;  Service: General;  Laterality: N/A;  . LAPAROSCOPY  02/01/2017  Laparoscopic lysis of adhesions  . LAPAROSCOPY N/A 02/01/2017   Procedure: LAPAROSCOPY Lysis of adhesions;  Surgeon: Berna Bue, MD;  Location: [facility] OR;  Service: General;  Laterality: N/A;  . LAPAROTOMY N/A 02/01/2017   Procedure: excision of umbilical mass, small bowel resection;  Surgeon: Berna Bue, MD;  Location: [facility] OR;  Service: General;  Laterality: N/A;  . SPINE SURGERY     C6-C7 fusion x2  . SPINE SURGERY     herniated disc L4-L5  . TUBAL LIGATION       OB History   None      Home Medications    Prior to Admission medications   Medication Sig Start Date End Date Taking? Authorizing Provider  BAYER CONTOUR  NEXT TEST test strip USE AS INSTRUCTED TO CHECK BLOOD SUGAR THREE TIMES DAILY 11/02/15   Margaree Mackintosh, MD  Biotin 5 MG CAPS Take 5 mg by mouth daily.     [provider]  bisacodyl (DULCOLAX) 5 MG EC tablet bisacodyl 5 mg tablet daily as needed for contipation    [provider]  calcium-vitamin D 250-100 MG-UNIT tablet Take 1 tablet by mouth 3 (three) times daily.     [provider]  cyclobenzaprine (FLEXERIL) 5 MG tablet Take 1 tablet (5 mg total) by mouth 2 (two) times daily. Patient taking differently: Take 5 mg by mouth 3 (three) times daily as needed for muscle spasms.  06/20/16   Patel, Roxana Hires K, DO  fluticasone (FLONASE) 50 MCG/ACT nasal spray Place 1-2 sprays into both nostrils daily as needed for allergies or rhinitis.    [provider]  MAGNESIUM PO Take 250 mg by mouth 2 (two) times daily.     [provider]  metFORMIN (GLUCOPHAGE) 500 MG tablet TAKE 1 TABLET(500 MG) BY MOUTH TWICE DAILY WITH A MEAL 03/20/17   Margaree Mackintosh, MD  Multiple Vitamin (MULTIVITAMIN) tablet Take 1 tablet by mouth daily.    [provider]  PROCTOFOAM HC rectal foam APPLY RECTALLY TO THE ANUS TWICE DAILY Patient taking differently: APPLY RECTALLY TO THE ANUS TWICE DAILY AS NEEDED FOR PAIN 10/28/16   Margaree Mackintosh, MD  promethazine (PHENERGAN) 25 MG tablet Take 1 tablet (25 mg total) by mouth every 8 (eight) hours as needed for nausea or vomiting. 11/21/17   Margaree Mackintosh, MD  ramipril (ALTACE) 5 MG capsule TAKE 1 CAPSULE(5 MG) BY MOUTH DAILY 01/11/17   Margaree Mackintosh, MD  rosuvastatin (CRESTOR) 5 MG tablet TAKE 1 TABLET(5 MG) BY MOUTH DAILY 12/19/16   Margaree Mackintosh, MD  Topiramate ER (TROKENDI XR) 50 MG CP24 Take 50 mg by mouth daily. 04/06/16   Nita Sickle K, DO  triamcinolone cream (KENALOG) 0.1 % Apply 1 application topically 2 (two) times daily. 05/02/17   Margaree Mackintosh, MD  vitamin B-12 (CYANOCOBALAMIN) 1000 MCG tablet Take 1,000 mcg by mouth daily.     [provider]    Family History Family History  Problem Relation Age of Onset  . Diabetes Mother   . Heart disease Mother   . Hypertension Mother   . Lung disease Father   . Heart disease Father   . Hypertension Father   . Diabetes Father   . Hypertension Sister   . Hyperlipidemia Brother   . Hypertension Brother   . Heart attack Maternal Grandmother   . Heart disease Maternal Grandmother   . Hypertension Maternal Grandfather   . Diabetes Maternal Grandfather   .  Heart disease Maternal Grandfather     Social History Social History   Tobacco Use  . Smoking status: Former Smoker    Packs/day: 2.50    Years: 8.00    Pack years: 20.00    Types: Cigarettes    Last attempt to quit: 05/18/1983    Years since quitting: 34.5  . Smokeless tobacco: Never Used  Substance Use Topics  . Alcohol use: No    Alcohol/week: 0.0 standard drinks  . Drug use: No     Allergies   Morphine and related; Pantoprazole; and Tyloxapol   Review of Systems Review of Systems  Constitutional: Negative for fever.  HENT: Negative for sore throat.   Eyes: Negative for visual disturbance.  Respiratory: Negative for shortness of breath.   Cardiovascular: Positive for syncope. Negative for chest pain.  Gastrointestinal: Positive for nausea. Negative for abdominal pain, bowel incontinence and vomiting.  Genitourinary: Negative for dysuria.  Musculoskeletal: Positive for back pain and neck pain.  Skin: Negative for rash.  Neurological: Positive for numbness. Negative for focal weakness, weakness and headaches.     Physical Exam Updated Vital Signs BP (!) 146/75   Pulse 79   Temp 97.6 F (36.4 C) (Oral)   Resp 12   SpO2 100%   Physical Exam  Constitutional: She is oriented to person, place, and time. She appears well-developed and well-nourished. No distress.  HENT:  Head: Normocephalic and atraumatic.  Eyes: Conjunctivae are normal.  Neck: Neck supple.  Cardiovascular:  Normal rate, regular rhythm and normal heart sounds.  No murmur heard. Pulmonary/Chest: Effort normal and breath sounds normal. No respiratory distress.  Abdominal: Soft. There is no tenderness.  Musculoskeletal: Normal range of motion. She exhibits no edema, tenderness or deformity.  Neurological: She is alert and oriented to person, place, and time. She has normal strength. A sensory deficit is present. No cranial nerve deficit. GCS eye subscore is 4. GCS verbal subscore is 5. GCS motor subscore is 6.  She has normal cranial nerves.  She is got some burning paresthesias in the C6 distribution right greater than left.  There does not appear to be any motor deficits of her arms or legs.  Skin: Skin is warm and dry.  Psychiatric: She has a normal mood and affect.  Nursing note and vitals reviewed.    ED Treatments / Results  Labs (all labs ordered are listed, but only abnormal results are displayed) Labs Reviewed  BASIC METABOLIC PANEL - Abnormal; Notable for the following components:      Result Value   Glucose, Bld 196 (*)    All other components within normal limits  CBC  URINALYSIS, ROUTINE W REFLEX MICROSCOPIC  CBG MONITORING, ED    EKG EKG Interpretation  Date/Time:  Tuesday November 28 2017 13:46:43 EST Ventricular Rate:  72 PR Interval:  144 QRS Duration: 80 QT Interval:  396 QTC Calculation: 433 R Axis:   84 Text Interpretation:  Sinus rhythm with occasional Premature ventricular complexes Nonspecific T wave abnormality Abnormal ECG similar pattern to prior 10/16 Confirmed by Meridee ScoreButler,  [phone]([id number]) on 11/28/2017 3:42:55 PM   Radiology Dg Chest 2 View  Result Date: 11/28/2017 CLINICAL DATA:  Recent fall with back pain, initial encounter EXAM: CHEST - 2 VIEW COMPARISON:  11/13/2014 FINDINGS: The heart size and mediastinal contours are within normal limits. Both lungs are clear. The visualized skeletal structures are unremarkable. IMPRESSION: No active cardiopulmonary  disease. Electronically Signed   By: Alcide CleverMark  Lukens M.D.   On:  11/28/2017 14:48   Dg Thoracic Spine 2 View  Result Date: 11/28/2017 CLINICAL DATA:  Recent syncopal episode with back pain, initial encounter EXAM: THORACIC SPINE 2 VIEWS COMPARISON:  11/13/2014 FINDINGS: Postsurgical changes are noted in the cervical spine. Vertebral body height is well maintained. Mild osteophytic changes are noted. No paraspinal mass or pedicle abnormality is noted. IMPRESSION: No acute abnormality noted. Electronically Signed   By: Alcide Clever M.D.   On: 11/28/2017 14:47   Ct Cervical Spine Wo Contrast  Result Date: 11/28/2017 CLINICAL DATA:  Neck pain after fall. EXAM: CT CERVICAL SPINE WITHOUT CONTRAST TECHNIQUE: Multidetector CT imaging of the cervical spine was performed without intravenous contrast. Multiplanar CT image reconstructions were also generated. COMPARISON:  None. FINDINGS: Alignment: Normal. Skull base and vertebrae: No acute fracture. No primary bone lesion or focal pathologic process. Soft tissues and spinal canal: No prevertebral fluid or swelling. No visible canal hematoma. Disc levels: Status post surgical anterior and posterior fusion of C6-7. Mild degenerative disc disease is noted at C5-6 with anterior osteophyte formation. Severe degenerative disc disease is noted at C7-T1. Upper chest: Negative. Other: None. IMPRESSION: Postsurgical and degenerative changes as described above. No acute abnormality seen in the cervical spine. Electronically Signed   By: Lupita Raider, M.D.   On: 11/28/2017 15:35    Procedures Procedures (including critical care time)  Medications Ordered in ED Medications - No data to display   Initial Impression / Assessment and Plan / ED Course  I have reviewed the triage vital signs and the nursing notes.  Pertinent labs & imaging results that were available during my care of the patient were reviewed by me and considered in my medical decision making (see chart  for details).  Clinical Course as of Nov 29 1935  Tue Nov 28, 2017  1648 Patient's imaging does not show any acute fracture.  I have given her some pain medicine will re-eval to see if she needs an MRI for her ongoing paresthesias in her C6 distribution.   [MB]    Clinical Course User Index [MB] Terrilee Files, MD     Final Clinical Impressions(s) / ED Diagnoses   Final diagnoses:  Syncope and collapse  Cervical strain, acute, initial encounter  Radicular pain in right arm    ED Discharge Orders    None       Terrilee Files, MD 11/28/17 [phone]

## 2017-11-28 NOTE — ED Triage Notes (Signed)
Pt states she was drinking cleanse for colonoscopy tomorrow. She reports she was washing her hands and had syncopal episode. She reports she was confused prior to event. Pt aOX4 in triage. Skin warm and dry.

## 2017-11-28 NOTE — ED Notes (Signed)
Pt transported to MRI on stretcher. 

## 2017-11-28 NOTE — ED Notes (Signed)
Pt requesting sedation medication for MRI.

## 2017-11-28 NOTE — Progress Notes (Signed)
Received call from Dr Denton LankSteinl, EDP, regarding patient Melissa Benson is a 57 year old female who had a syncopal episode earlier today after going from sitting to standing. By report she was complaining of neck pain and numbness in thumb so an MRI of her cervical spine was ordered. MRI significant for acute T2 superior endplate compression fracture with approx 20% height loss. There is also ligamentous edema at T1-2 without discontinuity or malalignment. She is neurologically intact. There is no indication for acute NS intervention. Ordered CTO brace which is to be worn at all times. Can f/u outpt in 1 week for monitoring.

## 2017-11-28 NOTE — ED Provider Notes (Signed)
Signed out by Dr Charm BargesButler to check MRI, then may d/c to home.  MRI reviewed - T2 fracture w associated ligament edema/sprain.   No weakness on exam. Pain improved.   Neurosurgery consulted - discussed w [facility] - reviewed MR - they indicate they will leave consult note, have ordered brace/support, and indicate d/c to home, plan for conservative therapy and office follow up.       Cathren LaineSteinl, Airyanna Dipalma, MD 11/28/17 2037

## 2017-11-28 NOTE — ED Notes (Signed)
Ortho tech at bedside 

## 2017-11-28 NOTE — ED Notes (Signed)
Patient transported to CT 

## 2017-12-18 DIAGNOSIS — S22028D Other fracture of second thoracic vertebra, subsequent encounter for fracture with routine healing: Secondary | ICD-10-CM | POA: Diagnosis not present

## 2017-12-20 ENCOUNTER — Other Ambulatory Visit: Payer: Self-pay | Admitting: Internal Medicine

## 2018-01-01 DIAGNOSIS — Z86010 Personal history of colonic polyps: Secondary | ICD-10-CM | POA: Diagnosis not present

## 2018-01-01 DIAGNOSIS — R1013 Epigastric pain: Secondary | ICD-10-CM | POA: Diagnosis not present

## 2018-01-01 DIAGNOSIS — Z800 Family history of malignant neoplasm of digestive organs: Secondary | ICD-10-CM | POA: Diagnosis not present

## 2018-01-01 DIAGNOSIS — K5730 Diverticulosis of large intestine without perforation or abscess without bleeding: Secondary | ICD-10-CM | POA: Diagnosis not present

## 2018-01-01 DIAGNOSIS — Z9884 Bariatric surgery status: Secondary | ICD-10-CM | POA: Diagnosis not present

## 2018-01-01 DIAGNOSIS — Z8371 Family history of colonic polyps: Secondary | ICD-10-CM | POA: Diagnosis not present

## 2018-01-01 LAB — HM COLONOSCOPY

## 2018-01-05 ENCOUNTER — Other Ambulatory Visit: Payer: Self-pay | Admitting: Internal Medicine

## 2018-01-15 DIAGNOSIS — Z6832 Body mass index (BMI) 32.0-32.9, adult: Secondary | ICD-10-CM | POA: Diagnosis not present

## 2018-01-15 DIAGNOSIS — I10 Essential (primary) hypertension: Secondary | ICD-10-CM | POA: Diagnosis not present

## 2018-01-15 DIAGNOSIS — S22028D Other fracture of second thoracic vertebra, subsequent encounter for fracture with routine healing: Secondary | ICD-10-CM | POA: Diagnosis not present

## 2018-01-29 DIAGNOSIS — Z808 Family history of malignant neoplasm of other organs or systems: Secondary | ICD-10-CM | POA: Diagnosis not present

## 2018-01-29 DIAGNOSIS — Z01419 Encounter for gynecological examination (general) (routine) without abnormal findings: Secondary | ICD-10-CM | POA: Diagnosis not present

## 2018-01-29 DIAGNOSIS — Z820 Family history of epilepsy and other diseases of the nervous system: Secondary | ICD-10-CM | POA: Insufficient documentation

## 2018-01-29 DIAGNOSIS — Z90710 Acquired absence of both cervix and uterus: Secondary | ICD-10-CM | POA: Insufficient documentation

## 2018-01-29 DIAGNOSIS — K55049 Acute infarction of large intestine, extent unspecified: Secondary | ICD-10-CM | POA: Insufficient documentation

## 2018-01-29 DIAGNOSIS — Z803 Family history of malignant neoplasm of breast: Secondary | ICD-10-CM | POA: Diagnosis not present

## 2018-01-29 DIAGNOSIS — Z800 Family history of malignant neoplasm of digestive organs: Secondary | ICD-10-CM | POA: Insufficient documentation

## 2018-01-29 DIAGNOSIS — Z1231 Encounter for screening mammogram for malignant neoplasm of breast: Secondary | ICD-10-CM | POA: Diagnosis not present

## 2018-01-29 DIAGNOSIS — Z6832 Body mass index (BMI) 32.0-32.9, adult: Secondary | ICD-10-CM | POA: Diagnosis not present

## 2018-04-05 ENCOUNTER — Telehealth: Payer: Self-pay | Admitting: Internal Medicine

## 2018-04-05 ENCOUNTER — Encounter: Payer: Self-pay | Admitting: Internal Medicine

## 2018-04-05 ENCOUNTER — Other Ambulatory Visit: Payer: Self-pay

## 2018-04-05 ENCOUNTER — Ambulatory Visit (INDEPENDENT_AMBULATORY_CARE_PROVIDER_SITE_OTHER): Payer: BLUE CROSS/BLUE SHIELD | Admitting: Internal Medicine

## 2018-04-05 DIAGNOSIS — H1031 Unspecified acute conjunctivitis, right eye: Secondary | ICD-10-CM | POA: Diagnosis not present

## 2018-04-05 MED ORDER — OFLOXACIN 0.3 % OP SOLN: 2.0000 [drp] | Freq: Four times a day (QID) | OPHTHALMIC | 0 refills | Status: DC

## 2018-04-05 MED ORDER — AMOXICILLIN 500 MG PO CAPS: 500.0000 mg | ORAL_CAPSULE | Freq: Three times a day (TID) | ORAL | 0 refills | Status: DC

## 2018-04-05 NOTE — Telephone Encounter (Signed)
Melissa Benson [phone]  Iasia called to say that yesterday her eye was pink and tearing up a lot, and this morning it was gunked up and swollen, very tender.

## 2018-04-05 NOTE — Telephone Encounter (Signed)
Scheduled pt for Virtual Visit

## 2018-04-05 NOTE — Telephone Encounter (Signed)
Virtual visit 

## 2018-04-05 NOTE — Progress Notes (Signed)
   Subjective:    Patient ID: Melissa Benson, female    DOB: 19-Oct-1960, 58 y.o.   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  Currently  working every other week.  2 days ago had onset of drainage from right that is discolored along with  redness and swelling of right eyelid.  She has had scratchy throat and runny nose.  No documented fever.  No shaking chills.  No nausea vomiting.  Some diarrhea last week.  This apparently has been going around the family   Review of Systems see above-says she has checked her throat and it is not red.     Objective:   Physical Exam Patient seen by virtual visit identified as Melissa Benson who is a patient here.  She has right eyelid redness and swelling.  Indicates she is able to move eyes in all directions without issue.  Drainage not seen at the present time.  This may be limited by video capability.       Assessment & Plan:  Acute right bacterial conjunctivitis  Acute URI  Plan: Ocuflox ophthalmic drops 2 drops in right eye 4 times a day for 5 days.  Place warm hot compresses on the right eye for 20 minutes 3 times a day.  Wash towels and pillowcases daily.  Do not go back to work until conjunctivitis is resolved.  Amoxicillin 500 mg 3 times a day for 10 days.

## 2018-04-05 NOTE — Patient Instructions (Addendum)
Ocuflox ophthalmic drops 2 drops in right eye 4 times a day for 5 days.  Warm hot compresses 20 minutes 3 times a day.  Wash towels and pillowcases daily.  Amoxicillin 500 mg 3 times a day for 10 days.  Do not return to work until conjunctivitis is cleared up.

## 2018-05-22 ENCOUNTER — Other Ambulatory Visit (INDEPENDENT_AMBULATORY_CARE_PROVIDER_SITE_OTHER): Payer: BLUE CROSS/BLUE SHIELD | Admitting: Internal Medicine

## 2018-05-22 ENCOUNTER — Other Ambulatory Visit: Payer: Self-pay

## 2018-05-22 VITALS — BP 132/80 | HR 80 | Temp 97.7°F | Wt 191.0 lb

## 2018-05-22 DIAGNOSIS — R7302 Impaired glucose tolerance (oral): Secondary | ICD-10-CM

## 2018-05-22 DIAGNOSIS — E785 Hyperlipidemia, unspecified: Secondary | ICD-10-CM

## 2018-05-23 LAB — HEPATIC FUNCTION PANEL
AG Ratio: 1.8 (calc) (ref 1.0–2.5)
ALT: 13 U/L (ref 6–29)
AST: 17 U/L (ref 10–35)
Albumin: 4.3 g/dL (ref 3.6–5.1)
Alkaline phosphatase (APISO): 68 U/L (ref 37–153)
Bilirubin, Direct: 0.2 mg/dL (ref 0.0–0.2)
Globulin: 2.4 g/dL (calc) (ref 1.9–3.7)
Indirect Bilirubin: 0.2 mg/dL (calc) (ref 0.2–1.2)
Total Bilirubin: 0.4 mg/dL (ref 0.2–1.2)
Total Protein: 6.7 g/dL (ref 6.1–8.1)

## 2018-05-23 LAB — HEMOGLOBIN A1C
Hgb A1c MFr Bld: 7.2 % of total Hgb — ABNORMAL HIGH (ref <5.7)
Mean Plasma Glucose: 160 (calc)
eAG (mmol/L): 8.9 (calc)

## 2018-05-23 LAB — LIPID PANEL
Cholesterol: 116 mg/dL (ref <200)
HDL: 48 mg/dL — ABNORMAL LOW (ref >=50)
LDL Cholesterol (Calc): 50 mg/dL (calc)
Non-HDL Cholesterol (Calc): 68 mg/dL (calc) (ref <130)
Total CHOL/HDL Ratio: 2.4 (calc) (ref <5.0)
Triglycerides: 101 mg/dL (ref <150)

## 2018-05-23 LAB — MICROALBUMIN / CREATININE URINE RATIO
Creatinine, Urine: 93 mg/dL (ref 20–275)
Microalb Creat Ratio: 4 mcg/mg creat (ref <30)
Microalb, Ur: 0.4 mg/dL

## 2018-05-24 ENCOUNTER — Other Ambulatory Visit: Payer: Self-pay

## 2018-05-24 ENCOUNTER — Ambulatory Visit
Admission: RE | Admit: 2018-05-24 | Discharge: 2018-05-24 | Disposition: A | Discharge status: Home / Self Care | Payer: BLUE CROSS/BLUE SHIELD | Source: Ambulatory Visit | Attending: Internal Medicine | Admitting: Internal Medicine

## 2018-05-24 ENCOUNTER — Ambulatory Visit (INDEPENDENT_AMBULATORY_CARE_PROVIDER_SITE_OTHER): Payer: BLUE CROSS/BLUE SHIELD | Admitting: Internal Medicine

## 2018-05-24 ENCOUNTER — Encounter: Payer: Self-pay | Admitting: Internal Medicine

## 2018-05-24 VITALS — BP 132/80 | HR 80 | Ht 65.5 in | Wt 191.0 lb

## 2018-05-24 DIAGNOSIS — I10 Essential (primary) hypertension: Secondary | ICD-10-CM

## 2018-05-24 DIAGNOSIS — R1033 Periumbilical pain: Secondary | ICD-10-CM

## 2018-05-24 DIAGNOSIS — R109 Unspecified abdominal pain: Secondary | ICD-10-CM | POA: Diagnosis not present

## 2018-05-24 DIAGNOSIS — E8881 Metabolic syndrome: Secondary | ICD-10-CM

## 2018-05-24 DIAGNOSIS — R1010 Upper abdominal pain, unspecified: Secondary | ICD-10-CM

## 2018-05-24 DIAGNOSIS — E785 Hyperlipidemia, unspecified: Secondary | ICD-10-CM

## 2018-05-24 DIAGNOSIS — K760 Fatty (change of) liver, not elsewhere classified: Secondary | ICD-10-CM

## 2018-05-24 DIAGNOSIS — Z9884 Bariatric surgery status: Secondary | ICD-10-CM

## 2018-05-24 MED ORDER — GLIPIZIDE 5 MG PO TABS: 5.0000 mg | ORAL_TABLET | Freq: Every day | ORAL | 1 refills | Status: DC

## 2018-05-31 ENCOUNTER — Telehealth: Payer: Self-pay | Admitting: Internal Medicine

## 2018-05-31 NOTE — Telephone Encounter (Signed)
Melissa Benson [phone]  [place] called to say she is still having stomach issues and she thinks she should go ahead and go to GI doctor. Dr Vida Rigger done her colonoscopy in December.

## 2018-05-31 NOTE — Telephone Encounter (Signed)
She should be able to call and get her own appt. We can send records.

## 2018-06-03 NOTE — Progress Notes (Signed)
   Subjective:    Patient ID: Melissa Benson, female    DOB: [birth year], 59 y.o.   MRN: [id number]  HPI 58 year old Female status post bariatric surgery with a history of chronic constipation issues.  Patient seen by interactive audio and video telecommunications due to the coronavirus pandemic.  Had Roux-en-Y gastric bypass surgery in 2017.  History of recurrent urinary infections, chronic neck pain, depression.  She has diabetes mellitus and sees Dr. Lucianne Muss for management.  History of hyperlipidemia, sleep apnea.  History of migraine headaches.  In January 2019, she had surgery for laparoscopic lysis of adhesions.  Presented with postprandial abdominal pain nausea and vomiting.  Pain centered around the umbilicus.  CT scan was concerning for internal hernia although the location was atypical considering bypass anatomy.  She also had excision of a preperitoneal umbilical mass approximately 6 cm in diameter and a small bowel resection approximately 10 cm in length.  She is concerned that this somehow has recurred.  She is agreeable to visit in this format today.  She is identified as Melissa Benson.  Chase Picket, using 2 identifiers and is recognized as a patient in this practice.  Patient is under some stress trying to care for her mother. Patient visits twice daily and works a full-time job.  In November, patient complaining of pain after eating in the lower abdomen.  She saw Dr. Ewing Schlein.  Last colonoscopy was 2012 showing no adenomatous polyps. She had colonoscopy December 2019 showing diverticulosis.   Dr. Dareen Piano is GYN physician.  History of hysterectomy.   Review of Systems     Objective:   Physical Exam  Unable to examine patient.  Recommend a KUB flat and upright to see if patient is constipated      Assessment & Plan:  KUB flat and upright obtained showing moderate stool throughout the colon.  There is a nonobstructive bowel gas pattern.  It was recommended that patient  take a laxative to relieve herself and then perhaps take MiraLAX on a regular basis.  If she has recurrence of pain, she should see Dr. Ewing Schlein once again.  25 minutes spent with patient on virtual visit and reviewing extensive history regarding surgery in 2019 and colonoscopy report.

## 2018-06-03 NOTE — Patient Instructions (Signed)
Take laxative for probable constipation.  If pain persist, will need to see Dr. Ewing Schlein

## 2018-06-12 NOTE — Telephone Encounter (Addendum)
Patient is already patient of Dr Watt Climes so sent of request or her to be seen and followed up for this issue. On 06/03/2018

## 2018-06-20 DIAGNOSIS — R933 Abnormal findings on diagnostic imaging of other parts of digestive tract: Secondary | ICD-10-CM | POA: Diagnosis not present

## 2018-06-20 DIAGNOSIS — R1013 Epigastric pain: Secondary | ICD-10-CM | POA: Diagnosis not present

## 2018-07-03 ENCOUNTER — Ambulatory Visit: Payer: BC Managed Care – PPO | Admitting: Internal Medicine

## 2018-07-03 ENCOUNTER — Encounter: Payer: Self-pay | Admitting: Internal Medicine

## 2018-07-03 ENCOUNTER — Other Ambulatory Visit: Payer: Self-pay

## 2018-07-03 VITALS — BP 110/88 | HR 82 | Temp 98.7°F | Ht 65.5 in | Wt 184.0 lb

## 2018-07-03 DIAGNOSIS — E119 Type 2 diabetes mellitus without complications: Secondary | ICD-10-CM

## 2018-07-03 DIAGNOSIS — R5383 Other fatigue: Secondary | ICD-10-CM

## 2018-07-03 MED ORDER — LINACLOTIDE 290 MCG PO CAPS: 290.0000 ug | ORAL_CAPSULE | Freq: Every day | ORAL | 11 refills | Status: DC

## 2018-07-03 NOTE — Progress Notes (Signed)
   Subjective:    Patient ID: Melissa Benson, female    DOB: [birth year], 59 y.o.   MRN: [id number]  HPI   Is now on generic Glucotrol 5 mg daily for diabetes mellitus.  In May hemoglobin A1c was 7.2%.  Now hemoglobin A1c is 6.9% with this medication.    Continues on Linzess for constipation issues.  We will try to get this approved for her and if we cannot she will need to see with Dr. Watt Benson  Situational stress has improved.  Her mother passed away and she is not having to go stay with her son now able to focus on her own health issues more.  Review of Systems see above     Objective:   Physical Exam BP 110/88 Weight 184 pounds. Spent 20 minutes speaking with her about grief with her mother's death, situational stress which has improved, type 2 diabetes mellitus control which has improved with small dose of Glucotrol.  Tolerates Linzess and I think this prevents her from having recurrent bouts of abdominal pain.  I think she has functional constipation issues.      Assessment & Plan:  Functional constipation and abdominal pain-improved with Linzess 290 mg daily.  Try to get approval from insurance company.  If not she will have to see Dr. Watt Benson.  Type 2 diabetes mellitus-Hemoglobin A1c improved with small dose of Glucotrol 5 mg daily from 7.2% to 6.9%.  Continue Glucotrol.  Routine health maintenance exam due November 2020.  Need to get this on the appointment note.  Situational stress improved  Grief reaction due to mother's passing  Return in November for health maintenance exam, fasting labs and evaluation of medical issues.

## 2018-07-04 LAB — HEMOGLOBIN A1C
Hgb A1c MFr Bld: 6.9 % of total Hgb — ABNORMAL HIGH (ref <5.7)
Mean Plasma Glucose: 151 (calc)
eAG (mmol/L): 8.4 (calc)

## 2018-07-04 LAB — TSH: TSH: 1.35 mIU/L (ref 0.40–4.50)

## 2018-07-04 MED ORDER — ALPRAZOLAM 0.5 MG PO TABS: 0.5000 mg | ORAL_TABLET | Freq: Every evening | ORAL | 2 refills | Status: DC | PRN

## 2018-07-13 ENCOUNTER — Telehealth: Payer: Self-pay

## 2018-07-13 NOTE — Telephone Encounter (Signed)
Left message for patient we can not get [facility] approved patient needs to call her GI doctor.

## 2018-07-16 ENCOUNTER — Telehealth: Payer: Self-pay | Admitting: Internal Medicine

## 2018-07-16 MED ORDER — GLIPIZIDE 5 MG PO TABS: 5.0000 mg | ORAL_TABLET | Freq: Every day | ORAL | 1 refills | Status: DC

## 2018-07-16 NOTE — Telephone Encounter (Signed)
Received Fax RX request from  Pharmacy - [facility]  Medication - glipiZIDE (GLUCOTROL) 5 MG tablet   Last Refill - 06-18-18  Last OV - 07-03-18  Last CPE - 11-21-17

## 2018-07-25 NOTE — Patient Instructions (Signed)
Sorry to learn of your brother's passing.  Hope you will have peace and time.  Continue Glucotrol.  We will try to get Linzess approved.  Health maintenance exam due November 2020.

## 2018-08-16 ENCOUNTER — Other Ambulatory Visit: Payer: Self-pay

## 2018-08-16 MED ORDER — GLIPIZIDE 5 MG PO TABS: 5.0000 mg | ORAL_TABLET | Freq: Every day | ORAL | 1 refills | Status: DC

## 2018-08-28 DIAGNOSIS — E119 Type 2 diabetes mellitus without complications: Secondary | ICD-10-CM | POA: Diagnosis not present

## 2018-08-28 DIAGNOSIS — H2513 Age-related nuclear cataract, bilateral: Secondary | ICD-10-CM | POA: Diagnosis not present

## 2018-09-10 ENCOUNTER — Other Ambulatory Visit: Payer: Self-pay | Admitting: Internal Medicine

## 2018-09-14 ENCOUNTER — Ambulatory Visit: Payer: BC Managed Care – PPO | Admitting: Internal Medicine

## 2018-09-14 ENCOUNTER — Telehealth: Payer: Self-pay | Admitting: Internal Medicine

## 2018-09-14 ENCOUNTER — Encounter: Payer: Self-pay | Admitting: Internal Medicine

## 2018-09-14 ENCOUNTER — Other Ambulatory Visit: Payer: Self-pay

## 2018-09-14 VITALS — BP 120/80 | HR 92 | Temp 98.0°F | Ht 65.5 in | Wt 196.0 lb

## 2018-09-14 DIAGNOSIS — R300 Dysuria: Secondary | ICD-10-CM

## 2018-09-14 DIAGNOSIS — R350 Frequency of micturition: Secondary | ICD-10-CM | POA: Diagnosis not present

## 2018-09-14 DIAGNOSIS — R8290 Unspecified abnormal findings in urine: Secondary | ICD-10-CM | POA: Diagnosis not present

## 2018-09-14 DIAGNOSIS — N390 Urinary tract infection, site not specified: Secondary | ICD-10-CM

## 2018-09-14 LAB — POCT URINALYSIS DIPSTICK
Appearance: NEGATIVE
Bilirubin, UA: NEGATIVE
Glucose, UA: NEGATIVE
Ketones, UA: NEGATIVE
Nitrite, UA: NEGATIVE
Odor: NEGATIVE
Protein, UA: NEGATIVE
Spec Grav, UA: 1.01 (ref 1.010–1.025)
Urobilinogen, UA: 0.2 E.U./dL
pH, UA: 6.5 (ref 5.0–8.0)

## 2018-09-14 MED ORDER — ROSUVASTATIN CALCIUM 5 MG PO TABS: ORAL_TABLET | ORAL | 0 refills | Status: DC

## 2018-09-14 MED ORDER — NITROFURANTOIN MONOHYD MACRO 100 MG PO CAPS: 100.0000 mg | ORAL_CAPSULE | Freq: Two times a day (BID) | ORAL | 0 refills | Status: DC

## 2018-09-14 NOTE — Progress Notes (Signed)
   Subjective:    Patient ID: Melissa EDINGER, female    DOB: August 26, 1960, 58 y.o.   MRN: [id number]  HPI Onset this am dysuria and frequency. Has seen blood on toilet tissue.  No fever or shaking chills nausea or vomiting.  History of anxiety  Dipstick UA reveals small LE otherwise negative.  Review of Systems no back pain but does have some frequency and dysuria.  Last treated for UTI in 2017.     Objective:   Physical Exam  No CVA tenderness.  She did not appear to be toxic.  She is afebrile.  See dipstick UA results.  Culture sent.      Assessment & Plan:  Acute urinary tract infection  Plan: Cipro 500 mg twice daily for 7 days.  Culture pending.  Plan:

## 2018-09-14 NOTE — Telephone Encounter (Signed)
Received Fax RX request from  [facility]  Medication - rosuvastatin (CRESTOR) 5 MG tablet    Last Refill - 06/16/18  Last OV - 07/03/18  Last CPE - 11/21/17  Next CPE scheduled for 11/27/18

## 2018-09-15 LAB — URINE CULTURE
MICRO NUMBER:: 871588
SPECIMEN QUALITY:: ADEQUATE

## 2018-09-21 ENCOUNTER — Ambulatory Visit: Payer: BC Managed Care – PPO | Admitting: Internal Medicine

## 2018-09-21 ENCOUNTER — Other Ambulatory Visit: Payer: Self-pay

## 2018-09-21 ENCOUNTER — Encounter: Payer: Self-pay | Admitting: Internal Medicine

## 2018-09-21 VITALS — BP 130/80 | HR 66 | Temp 98.0°F | Ht 65.5 in | Wt 194.0 lb

## 2018-09-21 DIAGNOSIS — R319 Hematuria, unspecified: Secondary | ICD-10-CM | POA: Diagnosis not present

## 2018-09-21 DIAGNOSIS — S22000S Wedge compression fracture of unspecified thoracic vertebra, sequela: Secondary | ICD-10-CM

## 2018-09-21 DIAGNOSIS — N390 Urinary tract infection, site not specified: Secondary | ICD-10-CM | POA: Diagnosis not present

## 2018-09-21 LAB — POCT URINALYSIS DIPSTICK
Appearance: NEGATIVE
Bilirubin, UA: NEGATIVE
Blood, UA: NEGATIVE
Glucose, UA: NEGATIVE
Ketones, UA: NEGATIVE
Leukocytes, UA: NEGATIVE
Nitrite, UA: NEGATIVE
Odor: NEGATIVE
Protein, UA: NEGATIVE
Spec Grav, UA: 1.015 (ref 1.010–1.025)
Urobilinogen, UA: 0.2 E.U./dL
pH, UA: 8 (ref 5.0–8.0)

## 2018-09-21 MED ORDER — CYCLOBENZAPRINE HCL 10 MG PO TABS: 10.0000 mg | ORAL_TABLET | Freq: Every day | ORAL | 0 refills | Status: DC

## 2018-09-21 MED ORDER — GABAPENTIN 300 MG PO CAPS: 300.0000 mg | ORAL_CAPSULE | Freq: Three times a day (TID) | ORAL | 3 refills | Status: DC

## 2018-09-21 NOTE — Patient Instructions (Signed)
Referral to neurosurgery office re t2 compression fracture. Urine today is clear.

## 2018-09-21 NOTE — Progress Notes (Signed)
   Subjective:    Patient ID: Melissa Benson, female    DOB: Apr 06, 1960, 58 y.o.   MRN: [id number]  HPI Pt had syncopal episode while taking colonoscopy prep November 2019. Went to ED November 26 and had T2 fracture.  Had follow up with PA at Neurosurgery office.  Still has pain and tramadol caused her to sweat. Tried muscle relaxant without relief. Tried some left over Flexeril with some relief. Have refilled this. Has had surgery in the past by Dr. Vertell Limber. Says it feels numb at times. Try gabapentin low dose. Has tried Ibuprofen with some relief.  Here for follow up of UTI symptoms. Urine dipstick showed small LE but culture showed multiple species    Review of Systems thoracic back pain every night with numbness since fall last November. Pt anxious. Complains of pain with deep breathing    Objective:   Physical Exam   No palpable parathoracic pain or deformity  Urine specimen today s/p treatment for UTI is normal       Assessment & Plan:  Cystitis- urine is now clear  Parathoracic back pain after a fall with T2 fracture.  Trial of Flexeril hs and gabapentin. Refer back to neurosurgery.

## 2018-09-26 NOTE — Patient Instructions (Signed)
Cipro 500 mg twice daily for 7 days.  Culture pending.

## 2018-10-11 DIAGNOSIS — S22028D Other fracture of second thoracic vertebra, subsequent encounter for fracture with routine healing: Secondary | ICD-10-CM | POA: Diagnosis not present

## 2018-10-16 ENCOUNTER — Other Ambulatory Visit: Payer: Self-pay | Admitting: Neurosurgery

## 2018-10-16 DIAGNOSIS — S22028D Other fracture of second thoracic vertebra, subsequent encounter for fracture with routine healing: Secondary | ICD-10-CM

## 2018-10-24 ENCOUNTER — Ambulatory Visit
Admission: RE | Admit: 2018-10-24 | Discharge: 2018-10-24 | Disposition: A | Discharge status: Home / Self Care | Payer: BC Managed Care – PPO | Source: Ambulatory Visit | Attending: Neurosurgery | Admitting: Neurosurgery

## 2018-10-24 ENCOUNTER — Encounter: Payer: Self-pay | Admitting: Internal Medicine

## 2018-10-24 ENCOUNTER — Other Ambulatory Visit: Payer: Self-pay

## 2018-10-24 ENCOUNTER — Ambulatory Visit (INDEPENDENT_AMBULATORY_CARE_PROVIDER_SITE_OTHER): Payer: BC Managed Care – PPO | Admitting: Internal Medicine

## 2018-10-24 DIAGNOSIS — S22028D Other fracture of second thoracic vertebra, subsequent encounter for fracture with routine healing: Secondary | ICD-10-CM | POA: Diagnosis not present

## 2018-10-24 DIAGNOSIS — Z23 Encounter for immunization: Secondary | ICD-10-CM

## 2018-10-24 DIAGNOSIS — Z6833 Body mass index (BMI) 33.0-33.9, adult: Secondary | ICD-10-CM | POA: Diagnosis not present

## 2018-10-24 DIAGNOSIS — M47814 Spondylosis without myelopathy or radiculopathy, thoracic region: Secondary | ICD-10-CM | POA: Diagnosis not present

## 2018-10-24 DIAGNOSIS — M4804 Spinal stenosis, thoracic region: Secondary | ICD-10-CM | POA: Diagnosis not present

## 2018-10-24 NOTE — Patient Instructions (Signed)
Patient received a flu vaccine IM L deltoid, AV, CMA  

## 2018-10-24 NOTE — Progress Notes (Signed)
Flu vaccine by CMA 

## 2018-10-25 ENCOUNTER — Ambulatory Visit: Payer: BC Managed Care – PPO | Admitting: Internal Medicine

## 2018-11-07 ENCOUNTER — Other Ambulatory Visit: Payer: Self-pay

## 2018-11-07 DIAGNOSIS — Z20822 Contact with and (suspected) exposure to covid-19: Secondary | ICD-10-CM

## 2018-11-08 LAB — NOVEL CORONAVIRUS, NAA: SARS-CoV-2, NAA: NOT DETECTED

## 2018-11-22 ENCOUNTER — Other Ambulatory Visit: Payer: BC Managed Care – PPO | Admitting: Internal Medicine

## 2018-11-22 ENCOUNTER — Other Ambulatory Visit: Payer: Self-pay

## 2018-11-22 DIAGNOSIS — E119 Type 2 diabetes mellitus without complications: Secondary | ICD-10-CM | POA: Diagnosis not present

## 2018-11-22 DIAGNOSIS — E8881 Metabolic syndrome: Secondary | ICD-10-CM

## 2018-11-22 DIAGNOSIS — R5383 Other fatigue: Secondary | ICD-10-CM

## 2018-11-22 DIAGNOSIS — I10 Essential (primary) hypertension: Secondary | ICD-10-CM

## 2018-11-22 DIAGNOSIS — E785 Hyperlipidemia, unspecified: Secondary | ICD-10-CM

## 2018-11-22 DIAGNOSIS — Z0000 Encounter for general adult medical examination without abnormal findings: Secondary | ICD-10-CM

## 2018-11-22 DIAGNOSIS — Z9884 Bariatric surgery status: Secondary | ICD-10-CM

## 2018-11-23 LAB — CBC WITH DIFFERENTIAL/PLATELET
Absolute Monocytes: 397 cells/uL (ref 200–950)
Basophils Absolute: 20 cells/uL (ref 0–200)
Basophils Relative: 0.4 %
Eosinophils Absolute: 78 cells/uL (ref 15–500)
Eosinophils Relative: 1.6 %
HCT: 40.1 % (ref 35.0–45.0)
Hemoglobin: 13.2 g/dL (ref 11.7–15.5)
Lymphs Abs: 1499 cells/uL (ref 850–3900)
MCH: 29.9 pg (ref 27.0–33.0)
MCHC: 32.9 g/dL (ref 32.0–36.0)
MCV: 90.9 fL (ref 80.0–100.0)
MPV: 9.7 fL (ref 7.5–12.5)
Monocytes Relative: 8.1 %
Neutro Abs: 2906 cells/uL (ref 1500–7800)
Neutrophils Relative %: 59.3 %
Platelets: 295 10*3/uL (ref 140–400)
RBC: 4.41 10*6/uL (ref 3.80–5.10)
RDW: 12.9 % (ref 11.0–15.0)
Total Lymphocyte: 30.6 %
WBC: 4.9 10*3/uL (ref 3.8–10.8)

## 2018-11-23 LAB — COMPLETE METABOLIC PANEL WITH GFR
AG Ratio: 1.7 (calc) (ref 1.0–2.5)
ALT: 17 U/L (ref 6–29)
AST: 17 U/L (ref 10–35)
Albumin: 4.4 g/dL (ref 3.6–5.1)
Alkaline phosphatase (APISO): 79 U/L (ref 37–153)
BUN: 15 mg/dL (ref 7–25)
CO2: 29 mmol/L (ref 20–32)
Calcium: 9.6 mg/dL (ref 8.6–10.4)
Chloride: 102 mmol/L (ref 98–110)
Creat: 0.7 mg/dL (ref 0.50–1.05)
GFR, Est African American: 111 mL/min/{1.73_m2} (ref >=60)
GFR, Est Non African American: 96 mL/min/{1.73_m2} (ref >=60)
Globulin: 2.6 g/dL (calc) (ref 1.9–3.7)
Glucose, Bld: 155 mg/dL — ABNORMAL HIGH (ref 65–99)
Potassium: 4.5 mmol/L (ref 3.5–5.3)
Sodium: 140 mmol/L (ref 135–146)
Total Bilirubin: 0.4 mg/dL (ref 0.2–1.2)
Total Protein: 7 g/dL (ref 6.1–8.1)

## 2018-11-23 LAB — LIPID PANEL
Cholesterol: 127 mg/dL (ref <200)
HDL: 54 mg/dL (ref >=50)
LDL Cholesterol (Calc): 54 mg/dL (calc)
Non-HDL Cholesterol (Calc): 73 mg/dL (calc) (ref <130)
Total CHOL/HDL Ratio: 2.4 (calc) (ref <5.0)
Triglycerides: 107 mg/dL (ref <150)

## 2018-11-23 LAB — HEMOGLOBIN A1C
Hgb A1c MFr Bld: 7.4 % of total Hgb — ABNORMAL HIGH (ref <5.7)
Mean Plasma Glucose: 166 (calc)
eAG (mmol/L): 9.2 (calc)

## 2018-11-23 LAB — TSH: TSH: 1.44 mIU/L (ref 0.40–4.50)

## 2018-11-26 ENCOUNTER — Telehealth: Payer: Self-pay | Admitting: Internal Medicine

## 2018-11-26 NOTE — Telephone Encounter (Signed)
Melissa Benson [phone]  Melissa Benson called to say on 11/16/2018 that she was possible exposed to coworker with [employer], she was wearing mask and coworker test results came in 11/22/18 in the afternoon. She has continued to work, no symptoms, no fever. She was wandering is it okay to come in for CPE on 11/27/18.

## 2018-11-26 NOTE — Telephone Encounter (Signed)
After speaking with Dr Renold Genta, since it has been 10 days since possible exposure and that she was wearing mask at the time, she will be okay to come in for CPE on 11/27/18.

## 2018-11-27 ENCOUNTER — Encounter: Payer: Self-pay | Admitting: Internal Medicine

## 2018-11-27 ENCOUNTER — Other Ambulatory Visit: Payer: Self-pay

## 2018-11-27 ENCOUNTER — Ambulatory Visit: Payer: BC Managed Care – PPO | Admitting: Internal Medicine

## 2018-11-27 VITALS — BP 140/80 | HR 89 | Ht 65.5 in | Wt 194.0 lb

## 2018-11-27 DIAGNOSIS — M542 Cervicalgia: Secondary | ICD-10-CM

## 2018-11-27 DIAGNOSIS — E785 Hyperlipidemia, unspecified: Secondary | ICD-10-CM

## 2018-11-27 DIAGNOSIS — E1169 Type 2 diabetes mellitus with other specified complication: Secondary | ICD-10-CM | POA: Diagnosis not present

## 2018-11-27 DIAGNOSIS — Z79899 Other long term (current) drug therapy: Secondary | ICD-10-CM

## 2018-11-27 DIAGNOSIS — Z6830 Body mass index (BMI) 30.0-30.9, adult: Secondary | ICD-10-CM

## 2018-11-27 DIAGNOSIS — Z5181 Encounter for therapeutic drug level monitoring: Secondary | ICD-10-CM

## 2018-11-27 DIAGNOSIS — Z9884 Bariatric surgery status: Secondary | ICD-10-CM | POA: Diagnosis not present

## 2018-11-27 DIAGNOSIS — E6609 Other obesity due to excess calories: Secondary | ICD-10-CM

## 2018-11-27 DIAGNOSIS — R519 Headache, unspecified: Secondary | ICD-10-CM

## 2018-11-27 DIAGNOSIS — E8881 Metabolic syndrome: Secondary | ICD-10-CM

## 2018-11-27 DIAGNOSIS — F411 Generalized anxiety disorder: Secondary | ICD-10-CM

## 2018-11-27 DIAGNOSIS — I10 Essential (primary) hypertension: Secondary | ICD-10-CM

## 2018-11-27 DIAGNOSIS — Z6831 Body mass index (BMI) 31.0-31.9, adult: Secondary | ICD-10-CM

## 2018-11-27 DIAGNOSIS — Z87440 Personal history of urinary (tract) infections: Secondary | ICD-10-CM

## 2018-11-27 DIAGNOSIS — Z0000 Encounter for general adult medical examination without abnormal findings: Secondary | ICD-10-CM

## 2018-11-27 DIAGNOSIS — R202 Paresthesia of skin: Secondary | ICD-10-CM

## 2018-11-27 DIAGNOSIS — G8929 Other chronic pain: Secondary | ICD-10-CM

## 2018-11-27 DIAGNOSIS — S22000S Wedge compression fracture of unspecified thoracic vertebra, sequela: Secondary | ICD-10-CM

## 2018-11-27 LAB — POCT URINALYSIS DIPSTICK
Appearance: NEGATIVE
Bilirubin, UA: NEGATIVE
Blood, UA: NEGATIVE
Glucose, UA: NEGATIVE
Ketones, UA: NEGATIVE
Leukocytes, UA: NEGATIVE
Nitrite, UA: NEGATIVE
Odor: NEGATIVE
Protein, UA: NEGATIVE
Spec Grav, UA: 1.01 (ref 1.010–1.025)
Urobilinogen, UA: 0.2 E.U./dL
pH, UA: 6.5 (ref 5.0–8.0)

## 2018-11-27 MED ORDER — GABAPENTIN 300 MG PO CAPS: ORAL_CAPSULE | ORAL | 3 refills | Status: DC

## 2018-11-27 MED ORDER — GLIPIZIDE 10 MG PO TABS: 10.0000 mg | ORAL_TABLET | Freq: Two times a day (BID) | ORAL | 3 refills | Status: DC

## 2018-11-27 NOTE — Progress Notes (Addendum)
Subjective:    Patient ID: Melissa Benson, female    DOB: Jul 02, 1960, 58 y.o.   MRN: [id number]  HPI 58 year old Female for health maintenance exam and evaluation of medical issues.  History of  chronic thoaracic back pain. Has seen Neurosurgeon recently and had MRI.  No malalignment was found.  Have suggested physical therapy.  We can try increasing gabapentin.  She is agreeable to trying increased dose of gabapentin before physical therapy is ordered.  In January 2019 she was admitted for lysis of adhesions, excision of umbilical mass and small bowel resection.  Surgery was done by Dr. Phylliss Blakeshelsea Connor, general surgeon.  She presented to the hospital with small bowel obstruction.  In November 2019 she suffered a T2 fracture with a syncopal episode.  This occurred when she was taking a prep for colonoscopy and felt acutely nauseated.  Due to this episode colonoscopy was postponed to January 01, 2018.  Study showed diverticulosis and 5-year follow-up recommended.  Also had upper endoscopy same day showing gastric bypass surgery with a small pouch and no lesions of concern.  History of diabetes mellitus, hyperlipidemia, sleep apnea, hypertension, migraine headaches, paresthesias in feet secondary to lumbar disc disease.  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  History of recurrent urinary infections.  History of chronic neck pain.  History of depression.  Sees Dr. Lucianne MussKumar for diabetic management.  Reports having had headaches starting in her late 30s.  Has had issues with chronic daily headache for some 20 years.  She tried Topamax.  CT of the head in 2017 was normal.  She stopped Topamax because she thought it made her feel jittery.  History of elevated liver enzymes.  We advised her to stop taking Tylenol and NSAIDs.  Despite that she continues to have headaches but liver functions normalized.  History of mid back pain level of both shoulder blades treated with Flexeril.  EMG in  2014 showed a mild L5 radiculopathy.  In October 2017 she had an EMG of the right upper and lower extremities.  These findings were consistent with a very mild old intraspinal canal lesion consistent with radiculopathy affecting the right L5 segment.  She has been a patient here since 2006.  At that time she weighed 251 pounds.  She weighed 260 pounds in 2009.  Hemoglobin A1c was 9.1% in 2009.  In 2010 she was started on insulin.  In 2014 she lost down to 223 pounds.  In 2017 after gastric bypass surgery she weighed 173 pounds.  In January 2017 she was found to have T wave inversions on EKG with family history of coronary artery disease in her mother.  She was sent to cardiologist and had undergone apparently a nuclear stress test in 2013.  She had a normal nuclear medicine study February 2017.  This past year has been difficult.  Her mother was under hospice care but passed away.  Patient works a full-time job at a bank.  Used to see Dr. Nolen MuMcKinney for depression.  Dr. Nolen MuMcKinney is no longer in private practice and patient has not wanted therapy recently.  Sees Dr. Dareen PianoAnderson, GYN.  Receives mammogram through that office.  History of hysterectomy.  Hypertension stable on current regimen.  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  She has 2 sons.  Does not smoke.  Family history: History of diabetes mellitus in father.  Mother deceased with history of heart disease and Alzheimer's disease.  3 brothers all  of them have substance abuse problems.  2 sisters 1 of whom is a diabetic and one who had gastric sleeve surgery with history of breast cancer.  There is one brother with history of stroke perhaps related to substance abuse according to the patient.    Review of Systems See above regarding thoracic spine issues  Physical exam: Vital signs reviewed.  Skin warm and dry.  Nodes none.  Neck is supple.  No JVD thyromegaly or carotid bruits.  Chest clear to auscultation.  Breast  without masses.  Cardiac exam regular rate and rhythm normal S1 and S2.  Abdomen obese soft nondistended without hepatosplenomegaly masses or significant tenderness.  GYN exam deferred to GYN physician.  No lower extremity pitting edema.  No gross focal deficits on brief neurological exam.      Objective:   Physical Exam Vitals signs reviewed.  Constitutional:      General: She is not in acute distress.    Appearance: She is obese.  HENT:     Head: Normocephalic and atraumatic.     Right Ear: Tympanic membrane normal.     Left Ear: Tympanic membrane normal.     Nose: Nose normal.     Mouth/Throat:     Pharynx: Oropharynx is clear.  Eyes:     General: No scleral icterus.    Extraocular Movements: Extraocular movements intact.     Conjunctiva/sclera: Conjunctivae normal.     Pupils: Pupils are equal, round, and reactive to light.  Neck:     Musculoskeletal: Neck supple. No neck rigidity.  Cardiovascular:     Rate and Rhythm: Normal rate and regular rhythm.     Pulses: Normal pulses.     Heart sounds: Normal heart sounds. No murmur.  Pulmonary:     Effort: Pulmonary effort is normal. No respiratory distress.     Breath sounds: Normal breath sounds. No wheezing or rales.  Abdominal:     General: Bowel sounds are normal. There is no distension.     Palpations: Abdomen is soft. There is no mass.     Tenderness: There is no guarding.  Genitourinary:    Comments: Deferred Musculoskeletal:     Right lower leg: No edema.     Left lower leg: No edema.  Lymphadenopathy:     Cervical: No cervical adenopathy.  Skin:    General: Skin is warm and dry.     Findings: No rash.  Neurological:     General: No focal deficit present.     Mental Status: She is alert and oriented to person, place, and time.     Cranial Nerves: No cranial nerve deficit.  Psychiatric:        Mood and Affect: Mood normal.        Behavior: Behavior normal.        Judgment: Judgment normal.            Assessment & Plan:  Chronic thoracic and neck pain. Recent MRI showed no hardware malformation.  Increase dose of gabapentin and consider physical therapy if not responding to increased dose of gabapentin.  Increasing gabapentin to 600 mg 3 times a day.  Continue Flexeril at bedtime.  History of T2 fracture due to a fall and syncope  Type 2 diabetes mellitus-hemoglobin A1c 7.4% previously was 6.9% in June.  Is on ramipril 5 mg daily for renal protection.  Start glipizide 5 mg daily.  Follow-up in January.  History of constipation treated with Linzess  Hyperlipidemia continue Crestor  5 mg daily.  Lipid panel is normal.  Anxiety and insomnia-has been taking Xanax 0.5 mg at bedtime  History of gastric bypass surgery  History of paresthesias in feet thought to be secondary to lumbar disc disease  History of sleep apnea  BMI 31.79 (Class I obesity with serious comorbidity)-needs to work on diet and exercise. Needs to be less than 30  Plan: She is being started on low-dose glipizide and will follow up in January.  Gabapentin has been increased.  Needs to work on diet exercise and weight loss.  Blood pressure being watched at 140/80 on low-dose ramipril.

## 2018-11-28 ENCOUNTER — Other Ambulatory Visit: Payer: Self-pay

## 2018-11-28 MED ORDER — GLIPIZIDE 5 MG PO TABS: 5.0000 mg | ORAL_TABLET | Freq: Two times a day (BID) | ORAL | 3 refills | Status: DC

## 2018-11-28 MED ORDER — CYCLOBENZAPRINE HCL 10 MG PO TABS: 10.0000 mg | ORAL_TABLET | Freq: Every day | ORAL | 11 refills | Status: DC

## 2018-11-28 NOTE — Addendum Note (Signed)
Addended by: Mady Haagensen on: 11/28/2018 11:55 AM   Modules accepted: Orders

## 2018-11-30 NOTE — Patient Instructions (Signed)
Increase gabapentin to 600 mg 3 times a day.  Start glipizide 5 mg daily and follow-up in January.

## 2018-12-10 ENCOUNTER — Telehealth: Payer: Self-pay | Admitting: Internal Medicine

## 2018-12-10 MED ORDER — ROSUVASTATIN CALCIUM 5 MG PO TABS: ORAL_TABLET | ORAL | 3 refills | Status: DC

## 2018-12-10 MED ORDER — RAMIPRIL 5 MG PO CAPS: ORAL_CAPSULE | ORAL | 3 refills | Status: DC

## 2018-12-10 NOTE — Telephone Encounter (Signed)
Received Fax RX request from  [facility], [place] - [address] [facility] [address] [phone] (Phone) [phone] (Fax)     Medication - rosuvastatin (CRESTOR) 5 MG tablet  ramipril (ALTACE) 5 MG capsule    Last Refill - 09/14/18  Last OV - 11/27/18  Last CPE - 11/27/18  Next Appointment - 01/10/19

## 2018-12-20 ENCOUNTER — Other Ambulatory Visit: Payer: Self-pay | Admitting: Internal Medicine

## 2019-01-02 LAB — HM DIABETES EYE EXAM

## 2019-01-08 ENCOUNTER — Other Ambulatory Visit: Payer: BC Managed Care – PPO | Admitting: Internal Medicine

## 2019-01-08 ENCOUNTER — Other Ambulatory Visit: Payer: Self-pay

## 2019-01-08 ENCOUNTER — Encounter: Payer: Self-pay | Admitting: Internal Medicine

## 2019-01-08 DIAGNOSIS — E119 Type 2 diabetes mellitus without complications: Secondary | ICD-10-CM

## 2019-01-09 LAB — HEMOGLOBIN A1C
Hgb A1c MFr Bld: 7.2 % of total Hgb — ABNORMAL HIGH (ref <5.7)
Mean Plasma Glucose: 160 (calc)
eAG (mmol/L): 8.9 (calc)

## 2019-01-10 ENCOUNTER — Encounter: Payer: Self-pay | Admitting: Internal Medicine

## 2019-01-10 ENCOUNTER — Ambulatory Visit: Payer: BC Managed Care – PPO | Admitting: Internal Medicine

## 2019-01-10 ENCOUNTER — Other Ambulatory Visit: Payer: Self-pay

## 2019-01-10 VITALS — BP 140/80 | HR 84 | Temp 98.0°F | Ht 65.5 in | Wt 200.0 lb

## 2019-01-10 DIAGNOSIS — I10 Essential (primary) hypertension: Secondary | ICD-10-CM

## 2019-01-10 DIAGNOSIS — S22000S Wedge compression fracture of unspecified thoracic vertebra, sequela: Secondary | ICD-10-CM | POA: Diagnosis not present

## 2019-01-10 DIAGNOSIS — E1169 Type 2 diabetes mellitus with other specified complication: Secondary | ICD-10-CM | POA: Diagnosis not present

## 2019-01-10 DIAGNOSIS — Z9884 Bariatric surgery status: Secondary | ICD-10-CM

## 2019-01-10 DIAGNOSIS — F411 Generalized anxiety disorder: Secondary | ICD-10-CM

## 2019-01-10 DIAGNOSIS — E785 Hyperlipidemia, unspecified: Secondary | ICD-10-CM

## 2019-01-10 NOTE — Progress Notes (Signed)
   Subjective:    Patient ID: Melissa Benson, female    DOB: October 26, 1960, 59 y.o.   MRN: [id number]  HPI She is here today for follow-up on diabetes and musculoskeletal pain.  Fell at work in December and has pain around C7 midline. Saw Dr. Nita Sickle for headaches and pain in 2017. Would like to see her again.  Appointment has been arranged for March.  Has also seen her for headaches as well. Has some concern about taking gabapentin for chronic pain.  She had MRI of the C-spine in November 2019 showing T2 superior endplate anterior compression fracture with minimal loss of height estimated about 20%.  Was found to have muscle strain throughout paraspinal muscles at cervicothoracic junction.  Hemoglobin A1c is 7.2% and previously was 7.4% in November.  She has had it down as low as 6.2% in May 2019.  I would like for her to see endocrinologist regarding diabetic management.  She seems a bit depressed about her chronic pain situation.  She has anxiety.  She is status post gastric bypass surgery in 2017.  In January 2019 she had surgery for lysis of adhesions.  She is not sleeping well and I have prescribed Xanax for her to take at bedtime.  I have also prescribed Flexeril.  In November started her on Neurontin 2 capsules 3 times a day.  She has history of diabetes mellitus, hyperlipidemia, sleep apnea, hypertension, migraine headaches, paresthesias in feet secondary to lumbar disc disease.  History of chronic constipation treated with Linzess.  Currently on glipizide 5 mg twice daily.  Is on Crestor 5 mg daily.  She is on low-dose ramipril with history of diabetes.  Takes Phenergan on a as needed basis for nausea and migraine headaches.  Seems to be nauseated quite a bit.    Review of Systems see above looks fatigued and depressed     Objective:   Physical Exam Blood pressure 140/80, pulse 84, weight 200 pounds.  BMI 32.78. Chest clear to auscultation.  Cardiac exam regular rate  and rhythm.      Assessment & Plan:  Chronic pain around C7 midline status post fall at work in December.  Will see neurologist in the near future.  Has been treated with gabapentin but not getting better.  Also has been given Flexeril.  Did not respond to physical therapy.  Has seen neurosurgeon.  Type 2 diabetes mellitus-uncontrolled could be a bit better and she will see endocrinologist in the near future.  Currently on glipizide  Status post gastric bypass surgery.  BMI is 32.78.  Insomnia related to pain and anxiety and has been given Xanax  Hyperlipidemia treated with Crestor  History of constipation treated with Linzess prescription today for Xanax to take at bedtime.  Chronic recurrent nausea and migraine headaches treated with as needed Phenergan.

## 2019-01-17 ENCOUNTER — Other Ambulatory Visit: Payer: Self-pay

## 2019-01-17 MED ORDER — ALPRAZOLAM 0.5 MG PO TABS: 0.5000 mg | ORAL_TABLET | Freq: Every evening | ORAL | 2 refills | Status: DC | PRN

## 2019-01-17 MED ORDER — PROMETHAZINE HCL 25 MG PO TABS: ORAL_TABLET | ORAL | 2 refills | Status: DC

## 2019-01-17 MED ORDER — CYCLOBENZAPRINE HCL 10 MG PO TABS: 10.0000 mg | ORAL_TABLET | Freq: Every day | ORAL | 11 refills | Status: DC

## 2019-02-02 NOTE — Patient Instructions (Signed)
Pain at C7 is not improving and she will see neurologist.  Diabetic control could be better and I will refer her to endocrinologist for evaluation.

## 2019-02-04 ENCOUNTER — Other Ambulatory Visit: Payer: Self-pay

## 2019-02-04 NOTE — Progress Notes (Signed)
Patient ID: Melissa Benson, female   DOB: 1960-08-02, 59 y.o.   MRN: [id number]           Reason for Appointment: Consultation for Type 2 Diabetes  Referring PCP:   History of Present Illness:          Date of diagnosis of type 2 diabetes mellitus: 2009         Previous history: She had been on various oral hypoglycemic drugs in the past and was on relatively large doses of basal insulin. 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 she was able to stop insulin A1c had gone down to 6% in 2018 but was followed only about once a year She was tried on Metformin alone in 2018 for about a year but this was then stopped  Recent history:   Most recent A1c is 7.2 done on    01/08/2019  Non-insulin hypoglycemic drugs the patient is taking are: Glipizide 5 mg daily  Current management, blood sugar patterns and problems identified:  She has been on glipizide because of gradually increasing blood sugars since about 06/2018  She thinks that her sugars started going up when she was gaining weight back in 2019 despite her gastric bypass surgery  Also because of her back pain for several months she has not been able to exercise   She has gained about 50 pounds from her lowest level after bypass surgery  He has been irregular with checking her blood sugars and not clear when her blood sugar started going up  Currently using expired test strips for her monitor  These are higher in the morning but she does not check readings after lunch or dinner  Currently taking regular glipizide 5 mg in the morning only        Side effects from medications have been: Nausea, some diarrhea from Metformin, some nausea from glipizide      Meal times are:  Breakfast is at Lunch: Dinner:    Typical meal intake: Breakfast is usually a protein shake,  usually having only almonds and pickles at lunch, will have chicken and vegetables at dinner, snacks will be cheese crackers             Exercise:  Walking up to 30 minutes at times limited by back pain  Glucose monitoring:  done  times a day         Glucometer:Contour .       Blood Glucose readings by time of day and averages from meter download:  PREMEAL Breakfast Lunch Dinner Bedtime  Overall   Glucose range:  118-161  100-143     Median:  147     128   Dietician visit, most recent: 2017,  Weight history:  Wt Readings from Last 3 Encounters:  02/05/19 203 lb 3.2 oz (92.2 kg)  01/10/19 200 lb (90.7 kg)  11/27/18 194 lb (88 kg)    Glycemic control:   Lab Results  Component Value Date   HGBA1C 7.2 (H) 01/08/2019   HGBA1C 7.4 (H) 11/22/2018   HGBA1C 6.9 (H) 07/03/2018   Lab Results  Component Value Date   MICROALBUR 0.4 05/22/2018   LDLCALC 54 11/22/2018   CREATININE 0.70 11/22/2018   Lab Results  Component Value Date   MICRALBCREAT 4 05/22/2018    No results found for: FRUCTOSAMINE  Office  Visit on 02/05/2019  Component Date Value Ref Range Status  . POC Glucose 02/05/2019 134* 70 - 99 mg/dl Final    Allergies as of 02/05/2019      Reactions   Morphine And Related Nausea And Vomiting   Pt states also caused arm to turn black where medication was administered    Pantoprazole Hives   Tyloxapol Nausea And Vomiting      Medication List       Accurate as of February 05, 2019  9:15 PM. If you have any questions, ask your nurse or doctor.        ALPRAZolam 0.5 MG tablet Commonly known as: Xanax Take 1 tablet (0.5 mg total) by mouth at bedtime as needed for anxiety.   b complex vitamins capsule Take 1 capsule by mouth daily.   Bayer Contour Next Test test strip Generic drug: glucose blood USE AS INSTRUCTED TO CHECK BLOOD SUGAR THREE TIMES DAILY   Biotin 10000 MCG Tbdp Take 1 capsule by mouth daily.   calcium-vitamin D 250-100 MG-UNIT tablet Take 1  tablet by mouth 3 (three) times daily.   cyclobenzaprine 10 MG tablet Commonly known as: FLEXERIL Take 1 tablet (10 mg total) by mouth at bedtime.   docusate sodium 100 MG capsule Commonly known as: COLACE Take 200 mg by mouth 2 (two) times daily.   gabapentin 300 MG capsule Commonly known as: NEURONTIN Take 2 capsules 3 times a day   glipiZIDE 5 MG tablet Commonly known as: GLUCOTROL Take 1 tablet (5 mg total) by mouth 2 (two) times daily before a meal.   Insulin Pen Needle 31G X 5 MM Misc Use on Victoza pen qd Started by: Elayne Snare, MD   linaclotide 290 MCG Caps capsule Commonly known as: Linzess Take 1 capsule (290 mcg total) by mouth daily before breakfast.   MAGNESIUM PO Take 500 mg by mouth daily.   multivitamin Chew chewable tablet Chew 1 tablet by mouth daily.   promethazine 25 MG tablet Commonly known as: PHENERGAN TAKE 1 TABLET(25 MG) BY MOUTH EVERY 8 HOURS AS NEEDED FOR NAUSEA OR VOMITING   ramipril 5 MG capsule Commonly known as: ALTACE TAKE 1 CAPSULE(5 MG) BY MOUTH DAILY   rosuvastatin 5 MG tablet Commonly known as: CRESTOR TAKE 1 TABLET(5 MG) BY MOUTH DAILY   sucralfate 1 g tablet Commonly known as: CARAFATE Take 1 g by mouth 4 (four) times daily.   valACYclovir 500 MG tablet Commonly known as: VALTREX Take 500 mg by mouth 2 (two) times daily as needed (cold sores outbreak).   Victoza 18 MG/3ML Sopn Generic drug: liraglutide Inject 0.2 mLs (1.2 mg total) into the skin daily. Inject once daily at the same time Started by: Elayne Snare, MD       Allergies:  Allergies  Allergen Reactions  . Morphine And Related Nausea And Vomiting    Pt states also caused arm to turn black where medication was administered   . Pantoprazole Hives  . Tyloxapol Nausea And Vomiting    Past Medical History:  Diagnosis Date  . Complication of anesthesia   . Diabetes mellitus   . GERD (gastroesophageal reflux disease)   . Headache    no issues since stop  caffeine  . History of hiatal hernia   . History of migraine headaches   . History of urinary tract infection    last one 11/2014   . History of vertigo   . Hyperlipidemia   . Hypertension   . Neck  pain, chronic   . OSA (obstructive sleep apnea)    on CPAP  . PONV (postoperative nausea and vomiting)   . Psoriasis   . Rosacea   . Weak urinary stream     Past Surgical History:  Procedure Laterality Date  . 2D ECHOCARDIOGRAM  07/20/2011   EF >55%, normal  . ABDOMINAL HYSTERECTOMY    . APPENDECTOMY    . CARDIOVASCULAR STRESS TEST  07/20/2011   Normal myocardial perfusion study, EKG negative for ischemia, no ECG changes  . CHOLECYSTECTOMY    . colonscopy      polyps removed   . DIAGNOSTIC LAPAROSCOPY    . DILATION AND CURETTAGE OF UTERUS    . LAPAROSCOPIC ROUX-EN-Y GASTRIC BYPASS WITH HIATAL HERNIA REPAIR N/A 01/12/2015   Procedure: LAPAROSCOPIC ROUX-EN-Y GASTRIC BYPASS WITH LYSIS OF ADHESIONS ;  Surgeon: Luretha Murphy, MD;  Location: [facility];  Service: General;  Laterality: N/A;  . LAPAROSCOPY  02/01/2017   Laparoscopic lysis of adhesions  . LAPAROSCOPY N/A 02/01/2017   Procedure: LAPAROSCOPY Lysis of adhesions;  Surgeon: Berna Bue, MD;  Location: [facility];  Service: General;  Laterality: N/A;  . LAPAROTOMY N/A 02/01/2017   Procedure: excision of umbilical mass, small bowel resection;  Surgeon: Berna Bue, MD;  Location: [facility] OR;  Service: General;  Laterality: N/A;  . SPINE SURGERY     C6-C7 fusion x2  . SPINE SURGERY     herniated disc L4-L5  . TUBAL LIGATION      Family History  Problem Relation Age of Onset  . Diabetes Mother   . Heart disease Mother   . Hypertension Mother   . Lung disease Father   . Heart disease Father   . Hypertension Father   . Diabetes Father   . Hypertension Sister   . Hyperlipidemia Brother   . Hypertension Brother   . Heart attack Maternal Grandmother   . Heart disease Maternal Grandmother   . Hypertension Maternal Grandfather     . Diabetes Maternal Grandfather   . Heart disease Maternal Grandfather     Social History:  reports that she quit smoking about 35 years ago. Her smoking use included cigarettes. She has a 20.00 pack-year smoking history. She has never used smokeless tobacco. She reports that she does not drink alcohol or use drugs.   Review of Systems  Constitutional: Positive for weight gain.  HENT: Negative for headaches.   Eyes: Negative for blurred vision.  Cardiovascular: Negative for leg swelling.  Gastrointestinal: Positive for nausea, constipation and abdominal pain.       In am  Endocrine: Positive for fatigue.  Musculoskeletal: Positive for back pain.  Skin: Negative for rash.  Neurological: Positive for tingling.  Psychiatric/Behavioral: Positive for nervousness.   She was previously taking gabapentin with some relief of her back pain but she stopped it because of fear of addiction  Lipid history: Has been treated for cardiovascular risk reduction with Crestor 5 mg daily    Lab Results  Component Value Date   CHOL 127 11/22/2018   HDL 54 11/22/2018   LDLCALC 54 11/22/2018   TRIG 107 11/22/2018   CHOLHDL 2.4 11/22/2018           Hypertension: Has been on ramipril 5 mg daily  BP Readings from Last 3 Encounters:  02/05/19 134/72  01/10/19 140/80  11/27/18 140/80    Most recent eye exam was in 12/20  Most recent foot exam: 2/21  Currently known complications of diabetes: None  known  LABS:  Office Visit on 02/05/2019  Component Date Value Ref Range Status  . POC Glucose 02/05/2019 134* 70 - 99 mg/dl Final    Physical Examination:  BP 134/72 (BP Location: Left Arm, Patient Position: Sitting, Cuff Size: Large)   Pulse 96   Ht 5' 5.5" (1.664 m)   Wt 203 lb 3.2 oz (92.2 kg)   SpO2 98%   BMI 33.30 kg/m   GENERAL:         Patient has generalized obesity.    HEENT:         Eye exam shows normal external appearance.  Fundus exam deferred to ophthalmologist Oral  exam deferred due to wearing a mask   NECK:   There is no lymphadenopathy  Thyroid is not enlarged and no nodules felt.   Carotids are normal to palpation and no bruit heard  LUNGS:         Chest is symmetrical. Lungs are clear to auscultation.Marland Kitchen.   HEART:         Heart sounds:  S1 and S2 are normal. No murmur or click heard., no S3 or S4.   ABDOMEN:   There is no distention present. Liver and spleen are not palpable.  No other mass or tenderness present.    NEUROLOGICAL:   Ankle jerks are absent bilaterally.    Diabetic Foot Exam - Simple   Simple Foot Form Diabetic Foot exam was performed with the following findings: Yes   Visual Inspection No deformities, no ulcerations, no other skin breakdown bilaterally: Yes Sensation Testing Intact to touch and monofilament testing bilaterally: Yes Pulse Check Posterior Tibialis and Dorsalis pulse intact bilaterally: Yes Comments             Vibration sense is mostly reduced in the right distal first toes and nearly normal on the left.  MUSCULOSKELETAL:  There is no swelling or deformity of the peripheral joints.     EXTREMITIES:     There is no ankle edema.  SKIN:       No rash or lesions of concern.        ASSESSMENT:  Diabetes type 2  See history of present illness for detailed discussion of current diabetes management, blood sugar patterns and problems identified  Most recent A1c is mildly increased at 7.2  Current treatment regimen is only regular glipizide in the morning  She has had weight gain and somewhat persistently high blood sugars with taking only glipizide Discussed that she would be benefiting from the medication that would help her with long-term diabetes control, reduce cardiovascular risk as also promote weight loss She may benefit from an SGLT2 drug but has had periodic UTIs However in the past she has benefited significantly from Victoza and is a good candidate for a parenteral GLP-1 drug With a daily dose and  gradual titration should be able to tolerate this fairly well as before  Complications of diabetes: None evident  HYPERTENSION: Well-controlled on ramipril  Lipids: Well-controlled as above  Nonspecific abdominal symptoms including nausea and constipation of unclear etiology She thinks that she May be getting nauseated from glipizide  Persistent back pain related to previous thoracic vertebral compression fractures, currently not on treatment and previously had benefited from gabapentin  PLAN:    . Glucose monitoring: Patient advised to get new test strips and start checking readings either fasting or 2 hours after meals  . Diabetes education: May consider meal planning education  . Lifestyle changes: Dietary changes: Limit  high fat meals and snacks  Exercise regimen: If she is able to get pain under control with gabapentin she should start walking  Therapy changes:  Discussed with the patient the nature of GLP-1 drugs, the actions on various organ systems, how they benefit blood glucose control, as well as the benefit of weight loss and  increase satiety . Explained possible side effects especially nausea and vomiting initially; discussed safety information in package insert.   Have explained the injection technique and dosage titration of Victoza  starting with 0.6 mg once a day at the same time for the first week preferably at bedtime and then increasing to 1.2 mg if no symptoms of nausea.  Educational brochure on Victoza and co-pay card given . She will stop her glipizide after the first week . At the most of her sugars stay higher in the morning she can possibly try Amaryl 1 mg at bedtime or restart Metformin although this may tend to cause more nausea  . She may take gabapentin as needed for back pain which was helpful previously and reassured her that it is safe to take long-term    . BONE DENSITY screen to evaluate osteoporosis especially with her history of vertebral  fracture   Follow-up: 6 weeks    Patient Instructions  Start VICTOZA injection as shown once daily at the same time of the day.   Dial the dose to 0.6 mg on the pen for the first week.   You may inject in the stomach, thigh or arm. You may experience nausea in the first few days   You will feel fullness of the stomach with starting the medication and should try to keep the portions at meals small.  After 1 week increase the dose to 1.2mg  daily if no nausea present.   If any questions or concerns are present call the office or the Victoza Care helpline at [phone]. Visit [email or web address] for more useful information  Stop Glipizide on 1.2mg   Check blood sugars on waking up 2-3 days a week  Also check blood sugars about 2 hours after meals and do this after different meals by rotation  Recommended blood sugar levels on waking up are 90-130 and about 2 hours after meal is 130-160  Please bring your blood sugar monitor to each visit, thank you  Gapapentin as needed       Consultation note has been sent to the referring physician  Reather Littler 02/05/2019, 9:15 PM   Note: This office note was prepared with Dragon voice recognition system technology. Any transcriptional errors that result from this process are unintentional.

## 2019-02-05 ENCOUNTER — Ambulatory Visit: Payer: BC Managed Care – PPO | Admitting: Endocrinology

## 2019-02-05 ENCOUNTER — Encounter: Payer: Self-pay | Admitting: Endocrinology

## 2019-02-05 VITALS — BP 134/72 | HR 96 | Ht 65.5 in | Wt 203.2 lb

## 2019-02-05 DIAGNOSIS — N951 Menopausal and female climacteric states: Secondary | ICD-10-CM | POA: Diagnosis not present

## 2019-02-05 DIAGNOSIS — Z8781 Personal history of (healed) traumatic fracture: Secondary | ICD-10-CM | POA: Diagnosis not present

## 2019-02-05 DIAGNOSIS — E1165 Type 2 diabetes mellitus with hyperglycemia: Secondary | ICD-10-CM

## 2019-02-05 DIAGNOSIS — I10 Essential (primary) hypertension: Secondary | ICD-10-CM | POA: Diagnosis not present

## 2019-02-05 LAB — GLUCOSE, POCT (MANUAL RESULT ENTRY): POC Glucose: 134 mg/dl — AB (ref 70–99)

## 2019-02-05 MED ORDER — VICTOZA 18 MG/3ML ~~LOC~~ SOPN: 1.2000 mg | PEN_INJECTOR | Freq: Every day | SUBCUTANEOUS | 3 refills | Status: DC

## 2019-02-05 MED ORDER — INSULIN PEN NEEDLE 31G X 5 MM MISC: 1 refills | Status: DC

## 2019-02-05 NOTE — Patient Instructions (Addendum)
Start VICTOZA injection as shown once daily at the same time of the day.   Dial the dose to 0.6 mg on the pen for the first week.   You may inject in the stomach, thigh or arm. You may experience nausea in the first few days   You will feel fullness of the stomach with starting the medication and should try to keep the portions at meals small.  After 1 week increase the dose to 1.2mg  daily if no nausea present.   If any questions or concerns are present call the office or the Victoza Care helpline at [phone]. Visit [email or web address] for more useful information  Stop Glipizide on 1.2mg   Check blood sugars on waking up 2-3 days a week  Also check blood sugars about 2 hours after meals and do this after different meals by rotation  Recommended blood sugar levels on waking up are 90-130 and about 2 hours after meal is 130-160  Please bring your blood sugar monitor to each visit, thank you  Gapapentin as needed

## 2019-02-12 ENCOUNTER — Telehealth: Payer: Self-pay | Admitting: Internal Medicine

## 2019-02-12 MED ORDER — GLIPIZIDE 5 MG PO TABS: 5.0000 mg | ORAL_TABLET | Freq: Two times a day (BID) | ORAL | 3 refills | Status: DC

## 2019-02-12 NOTE — Telephone Encounter (Signed)
Received Fax RX request from  Pharmacy [id number] - [place], [place] - [address] AT [facility] [address] [facility] [address] Phone:  [phone]  Fax:  [phone]      Medication - glipiZIDE (GLUCOTROL) 5 MG tablet   Last Refill - 11/13/2018  Last OV - 01/10/2019  Last CPE - 11/27/18  Next Appointment -

## 2019-02-13 ENCOUNTER — Ambulatory Visit (INDEPENDENT_AMBULATORY_CARE_PROVIDER_SITE_OTHER)
Admission: RE | Admit: 2019-02-13 | Discharge: 2019-02-13 | Disposition: A | Discharge status: Home / Self Care | Payer: BC Managed Care – PPO | Source: Ambulatory Visit | Attending: Endocrinology | Admitting: Endocrinology

## 2019-02-13 ENCOUNTER — Other Ambulatory Visit: Payer: Self-pay

## 2019-02-13 DIAGNOSIS — N951 Menopausal and female climacteric states: Secondary | ICD-10-CM | POA: Diagnosis not present

## 2019-02-13 DIAGNOSIS — Z8781 Personal history of (healed) traumatic fracture: Secondary | ICD-10-CM

## 2019-02-14 ENCOUNTER — Telehealth: Payer: Self-pay | Admitting: Internal Medicine

## 2019-02-14 NOTE — Telephone Encounter (Signed)
Set up virtual visit 

## 2019-02-14 NOTE — Telephone Encounter (Signed)
[name] [phone] - work [phone] - cell  Melissa Benson called to say she thinks she is getting something called trigger finger. She is waking up with left middle finger bent and it pops when you open it. Hurts in the palm of her hand. She also said if she sleeps with hand flat that it is hard to bend it.

## 2019-02-14 NOTE — Telephone Encounter (Signed)
Scheduled

## 2019-02-15 ENCOUNTER — Ambulatory Visit (INDEPENDENT_AMBULATORY_CARE_PROVIDER_SITE_OTHER): Payer: BC Managed Care – PPO | Admitting: Internal Medicine

## 2019-02-15 ENCOUNTER — Encounter: Payer: Self-pay | Admitting: Internal Medicine

## 2019-02-15 ENCOUNTER — Other Ambulatory Visit: Payer: Self-pay

## 2019-02-15 VITALS — Ht 65.5 in | Wt 203.0 lb

## 2019-02-15 DIAGNOSIS — M65332 Trigger finger, left middle finger: Secondary | ICD-10-CM | POA: Diagnosis not present

## 2019-02-15 NOTE — Patient Instructions (Signed)
Referral to hand surgeon for evaluation and treatment of left third trigger finger

## 2019-02-15 NOTE — Progress Notes (Signed)
   Subjective:    Patient ID: Melissa Benson, female    DOB: 10-Nov-1960, 59 y.o.   MRN: [id number]  HPI 59 year old White Female longstanding patient here with history of chronic thoracic back pain,hx of gastric bypass surgery, hyperlipidemia,migraine headaches.slee[ apnea, HTN, DM has developed locking of left third finger.  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  By interactive audio and video telecommunications due to the coronavirus pandemic.  She is notified using 2 identifiers as Melissa Benson, a patient in this practice.  She is agreeable to visit in this format today.  She is seen virtually at her work.    Review of Systems See above    Objective:   Physical Exam  Patient seen virtually and demonstrates a classic left third trigger finger proximal PIP joint that she has to forcibly straighten      Assessment & Plan:  Left third trigger finger proximal PIP joint  Plan: Referral to hand surgeon for evaluation and treatment.

## 2019-02-26 ENCOUNTER — Other Ambulatory Visit: Payer: Self-pay | Admitting: Orthopedic Surgery

## 2019-02-26 DIAGNOSIS — M65332 Trigger finger, left middle finger: Secondary | ICD-10-CM | POA: Diagnosis not present

## 2019-02-28 DIAGNOSIS — Z01419 Encounter for gynecological examination (general) (routine) without abnormal findings: Secondary | ICD-10-CM | POA: Diagnosis not present

## 2019-02-28 DIAGNOSIS — Z1231 Encounter for screening mammogram for malignant neoplasm of breast: Secondary | ICD-10-CM | POA: Diagnosis not present

## 2019-02-28 DIAGNOSIS — Z6833 Body mass index (BMI) 33.0-33.9, adult: Secondary | ICD-10-CM | POA: Diagnosis not present

## 2019-03-07 ENCOUNTER — Ambulatory Visit: Payer: BC Managed Care – PPO | Admitting: Neurology

## 2019-03-07 ENCOUNTER — Encounter (HOSPITAL_BASED_OUTPATIENT_CLINIC_OR_DEPARTMENT_OTHER): Payer: Self-pay | Admitting: Orthopedic Surgery

## 2019-03-07 ENCOUNTER — Other Ambulatory Visit: Payer: Self-pay

## 2019-03-11 ENCOUNTER — Other Ambulatory Visit (HOSPITAL_COMMUNITY)
Admission: RE | Admit: 2019-03-11 | Discharge: 2019-03-11 | Disposition: A | Discharge status: Home / Self Care | Payer: BC Managed Care – PPO | Source: Ambulatory Visit | Attending: Orthopedic Surgery | Admitting: Orthopedic Surgery

## 2019-03-11 ENCOUNTER — Encounter (HOSPITAL_BASED_OUTPATIENT_CLINIC_OR_DEPARTMENT_OTHER)
Admission: RE | Admit: 2019-03-11 | Discharge: 2019-03-11 | Disposition: A | Discharge status: Home / Self Care | Payer: BC Managed Care – PPO | Source: Ambulatory Visit | Attending: Orthopedic Surgery | Admitting: Orthopedic Surgery

## 2019-03-11 DIAGNOSIS — Z20822 Contact with and (suspected) exposure to covid-19: Secondary | ICD-10-CM | POA: Diagnosis not present

## 2019-03-11 DIAGNOSIS — I10 Essential (primary) hypertension: Secondary | ICD-10-CM | POA: Diagnosis not present

## 2019-03-11 DIAGNOSIS — Z01818 Encounter for other preprocedural examination: Secondary | ICD-10-CM | POA: Insufficient documentation

## 2019-03-11 LAB — BASIC METABOLIC PANEL
Anion gap: 8 (ref 5–15)
BUN: 16 mg/dL (ref 6–20)
CO2: 26 mmol/L (ref 22–32)
Calcium: 9.2 mg/dL (ref 8.9–10.3)
Chloride: 108 mmol/L (ref 98–111)
Creatinine, Ser: 0.81 mg/dL (ref 0.44–1.00)
GFR calc Af Amer: >60 mL/min (ref >60)
GFR calc non Af Amer: >60 mL/min (ref >60)
Glucose, Bld: 141 mg/dL — ABNORMAL HIGH (ref 70–99)
Potassium: 4.2 mmol/L (ref 3.5–5.1)
Sodium: 142 mmol/L (ref 135–145)

## 2019-03-11 NOTE — Progress Notes (Signed)

## 2019-03-12 LAB — SARS CORONAVIRUS 2 (TAT 6-24 HRS): SARS Coronavirus 2: NEGATIVE

## 2019-03-14 ENCOUNTER — Encounter (HOSPITAL_BASED_OUTPATIENT_CLINIC_OR_DEPARTMENT_OTHER)
Admission: RE | Disposition: A | Discharge status: Home / Self Care | Payer: Self-pay | Source: Home / Self Care | Attending: Orthopedic Surgery

## 2019-03-14 ENCOUNTER — Ambulatory Visit (HOSPITAL_BASED_OUTPATIENT_CLINIC_OR_DEPARTMENT_OTHER): Payer: BC Managed Care – PPO | Admitting: Anesthesiology

## 2019-03-14 ENCOUNTER — Ambulatory Visit (HOSPITAL_BASED_OUTPATIENT_CLINIC_OR_DEPARTMENT_OTHER)
Admission: RE | Admit: 2019-03-14 | Discharge: 2019-03-14 | Disposition: A | Discharge status: Home / Self Care | Payer: BC Managed Care – PPO | Attending: Orthopedic Surgery | Admitting: Orthopedic Surgery

## 2019-03-14 ENCOUNTER — Encounter (HOSPITAL_BASED_OUTPATIENT_CLINIC_OR_DEPARTMENT_OTHER): Payer: Self-pay | Admitting: Orthopedic Surgery

## 2019-03-14 ENCOUNTER — Other Ambulatory Visit: Payer: Self-pay

## 2019-03-14 DIAGNOSIS — Z7984 Long term (current) use of oral hypoglycemic drugs: Secondary | ICD-10-CM | POA: Diagnosis not present

## 2019-03-14 DIAGNOSIS — Z885 Allergy status to narcotic agent status: Secondary | ICD-10-CM | POA: Insufficient documentation

## 2019-03-14 DIAGNOSIS — E119 Type 2 diabetes mellitus without complications: Secondary | ICD-10-CM | POA: Diagnosis not present

## 2019-03-14 DIAGNOSIS — K219 Gastro-esophageal reflux disease without esophagitis: Secondary | ICD-10-CM | POA: Diagnosis not present

## 2019-03-14 DIAGNOSIS — E785 Hyperlipidemia, unspecified: Secondary | ICD-10-CM | POA: Diagnosis not present

## 2019-03-14 DIAGNOSIS — Z888 Allergy status to other drugs, medicaments and biological substances status: Secondary | ICD-10-CM | POA: Insufficient documentation

## 2019-03-14 DIAGNOSIS — Z794 Long term (current) use of insulin: Secondary | ICD-10-CM | POA: Insufficient documentation

## 2019-03-14 DIAGNOSIS — F419 Anxiety disorder, unspecified: Secondary | ICD-10-CM | POA: Diagnosis not present

## 2019-03-14 DIAGNOSIS — G4733 Obstructive sleep apnea (adult) (pediatric): Secondary | ICD-10-CM | POA: Diagnosis not present

## 2019-03-14 DIAGNOSIS — I10 Essential (primary) hypertension: Secondary | ICD-10-CM | POA: Diagnosis not present

## 2019-03-14 DIAGNOSIS — Z79899 Other long term (current) drug therapy: Secondary | ICD-10-CM | POA: Diagnosis not present

## 2019-03-14 DIAGNOSIS — Z87891 Personal history of nicotine dependence: Secondary | ICD-10-CM | POA: Diagnosis not present

## 2019-03-14 DIAGNOSIS — M65332 Trigger finger, left middle finger: Secondary | ICD-10-CM | POA: Diagnosis not present

## 2019-03-14 HISTORY — DX: Anxiety disorder, unspecified: F41.9

## 2019-03-14 HISTORY — PX: TRIGGER FINGER RELEASE: SHX641

## 2019-03-14 LAB — GLUCOSE, CAPILLARY
Glucose-Capillary: 124 mg/dL — ABNORMAL HIGH (ref 70–99)
Glucose-Capillary: 137 mg/dL — ABNORMAL HIGH (ref 70–99)

## 2019-03-14 SURGERY — RELEASE, A1 PULLEY, FOR TRIGGER FINGER
Anesthesia: Monitor Anesthesia Care | Site: Hand | Laterality: Left

## 2019-03-14 MED ORDER — LIDOCAINE 2% (20 MG/ML) 5 ML SYRINGE
INTRAMUSCULAR | Status: AC
  Filled 2019-03-14: qty 5

## 2019-03-14 MED ORDER — FENTANYL CITRATE (PF) 100 MCG/2ML IJ SOLN
INTRAMUSCULAR | Status: AC
  Filled 2019-03-14: qty 2

## 2019-03-14 MED ORDER — CHLORHEXIDINE GLUCONATE 4 % EX LIQD: 60.0000 mL | Freq: Once | CUTANEOUS | Status: DC

## 2019-03-14 MED ORDER — FENTANYL CITRATE (PF) 250 MCG/5ML IJ SOLN
INTRAMUSCULAR | Status: DC | PRN
  Administered 2019-03-14: 50 ug via INTRAVENOUS

## 2019-03-14 MED ORDER — FENTANYL CITRATE (PF) 100 MCG/2ML IJ SOLN: 50.0000 ug | INTRAMUSCULAR | Status: DC | PRN

## 2019-03-14 MED ORDER — MIDAZOLAM HCL 2 MG/2ML IJ SOLN
INTRAMUSCULAR | Status: AC
  Filled 2019-03-14: qty 2

## 2019-03-14 MED ORDER — BUPIVACAINE HCL (PF) 0.25 % IJ SOLN
INTRAMUSCULAR | Status: DC | PRN
  Administered 2019-03-14: 6 mL

## 2019-03-14 MED ORDER — PROPOFOL 500 MG/50ML IV EMUL
INTRAVENOUS | Status: DC | PRN
  Administered 2019-03-14: 100 ug/kg/min via INTRAVENOUS

## 2019-03-14 MED ORDER — CEFAZOLIN SODIUM-DEXTROSE 2-4 GM/100ML-% IV SOLN
INTRAVENOUS | Status: AC
  Filled 2019-03-14: qty 100

## 2019-03-14 MED ORDER — ONDANSETRON HCL 4 MG/2ML IJ SOLN
INTRAMUSCULAR | Status: DC | PRN
  Administered 2019-03-14: 4 mg via INTRAVENOUS

## 2019-03-14 MED ORDER — MIDAZOLAM HCL 5 MG/5ML IJ SOLN
INTRAMUSCULAR | Status: DC | PRN
  Administered 2019-03-14: 2 mg via INTRAVENOUS

## 2019-03-14 MED ORDER — LIDOCAINE HCL (PF) 0.5 % IJ SOLN
INTRAMUSCULAR | Status: DC | PRN
  Administered 2019-03-14: 35 mL via INTRAVENOUS

## 2019-03-14 MED ORDER — CEFAZOLIN SODIUM-DEXTROSE 2-4 GM/100ML-% IV SOLN
2.0000 g | INTRAVENOUS | Status: AC
  Administered 2019-03-14: 2 g via INTRAVENOUS

## 2019-03-14 MED ORDER — PROPOFOL 10 MG/ML IV BOLUS
INTRAVENOUS | Status: AC
  Filled 2019-03-14: qty 20

## 2019-03-14 MED ORDER — HYDROCODONE-ACETAMINOPHEN 5-325 MG PO TABS: ORAL_TABLET | ORAL | 0 refills | Status: DC

## 2019-03-14 MED ORDER — DEXAMETHASONE SODIUM PHOSPHATE 10 MG/ML IJ SOLN
INTRAMUSCULAR | Status: DC | PRN
  Administered 2019-03-14: 5 mg via INTRAVENOUS

## 2019-03-14 MED ORDER — MIDAZOLAM HCL 2 MG/2ML IJ SOLN: 1.0000 mg | INTRAMUSCULAR | Status: DC | PRN

## 2019-03-14 MED ORDER — ONDANSETRON HCL 4 MG/2ML IJ SOLN
INTRAMUSCULAR | Status: AC
  Filled 2019-03-14: qty 2

## 2019-03-14 MED ORDER — LACTATED RINGERS IV SOLN: INTRAVENOUS | Status: DC

## 2019-03-14 SURGICAL SUPPLY — 36 items
APL PRP STRL LF DISP 70% ISPRP (MISCELLANEOUS) ×1
BLADE SURG 15 STRL LF DISP TIS (BLADE) ×2 IMPLANT
BLADE SURG 15 STRL SS (BLADE) ×4
BNDG CMPR 9X4 STRL LF SNTH (GAUZE/BANDAGES/DRESSINGS) ×1
BNDG COHESIVE 2X5 TAN STRL LF (GAUZE/BANDAGES/DRESSINGS) ×2 IMPLANT
BNDG ESMARK 4X9 LF (GAUZE/BANDAGES/DRESSINGS) ×1 IMPLANT
CHLORAPREP W/TINT 26 (MISCELLANEOUS) ×2 IMPLANT
CORD BIPOLAR FORCEPS 12FT (ELECTRODE) ×2 IMPLANT
COVER BACK TABLE 60X90IN (DRAPES) ×2 IMPLANT
COVER MAYO STAND STRL (DRAPES) ×2 IMPLANT
COVER WAND RF STERILE (DRAPES) IMPLANT
CUFF TOURN SGL QUICK 18X4 (TOURNIQUET CUFF) ×2 IMPLANT
DRAPE EXTREMITY T 121X128X90 (DISPOSABLE) ×2 IMPLANT
DRAPE SURG 17X23 STRL (DRAPES) ×2 IMPLANT
GAUZE SPONGE 4X4 12PLY STRL (GAUZE/BANDAGES/DRESSINGS) ×2 IMPLANT
GAUZE XEROFORM 1X8 LF (GAUZE/BANDAGES/DRESSINGS) ×2 IMPLANT
GLOVE BIO SURGEON STRL SZ7.5 (GLOVE) ×2 IMPLANT
GLOVE BIOGEL PI IND STRL 7.0 (GLOVE) IMPLANT
GLOVE BIOGEL PI IND STRL 8 (GLOVE) ×1 IMPLANT
GLOVE BIOGEL PI INDICATOR 7.0 (GLOVE) ×2
GLOVE BIOGEL PI INDICATOR 8 (GLOVE) ×1
GLOVE ECLIPSE 7.0 STRL STRAW (GLOVE) ×1 IMPLANT
GOWN STRL REUS W/ TWL LRG LVL3 (GOWN DISPOSABLE) ×1 IMPLANT
GOWN STRL REUS W/ TWL XL LVL3 (GOWN DISPOSABLE) IMPLANT
GOWN STRL REUS W/TWL LRG LVL3 (GOWN DISPOSABLE)
GOWN STRL REUS W/TWL XL LVL3 (GOWN DISPOSABLE) ×4 IMPLANT
NDL HYPO 25X1 1.5 SAFETY (NEEDLE) ×1 IMPLANT
NEEDLE HYPO 25X1 1.5 SAFETY (NEEDLE) ×2 IMPLANT
NS IRRIG 1000ML POUR BTL (IV SOLUTION) ×2 IMPLANT
PACK BASIN DAY SURGERY FS (CUSTOM PROCEDURE TRAY) ×2 IMPLANT
STOCKINETTE 4X48 STRL (DRAPES) ×2 IMPLANT
SUT ETHILON 4 0 PS 2 18 (SUTURE) ×2 IMPLANT
SYR BULB 3OZ (MISCELLANEOUS) ×2 IMPLANT
SYR CONTROL 10ML LL (SYRINGE) ×2 IMPLANT
TOWEL GREEN STERILE FF (TOWEL DISPOSABLE) ×3 IMPLANT
UNDERPAD 30X36 HEAVY ABSORB (UNDERPADS AND DIAPERS) ×2 IMPLANT

## 2019-03-14 NOTE — Discharge Instructions (Addendum)

## 2019-03-14 NOTE — Anesthesia Procedure Notes (Signed)
Anesthesia Regional Block: Bier block (IV Regional)   Pre-Anesthetic Checklist: ,, timeout performed, Correct Patient, Correct Site, Correct Laterality, Correct Procedure, Correct Position, site marked, Risks and benefits discussed,  Surgical consent,  Pre-op evaluation,  At surgeon's request and post-op pain management  Laterality: Left  Prep: alcohol swabs       Needles:  Injection technique: Single-shot      Additional Needles:   Procedures:,,,,, intact distal pulses, Esmarch exsanguination, single tourniquet utilized, #20gu IV placed  Narrative:  Start time: 03/14/2019 10:05 AM End time: 03/14/2019 10:05 AM  Events:,, positive IV test,,,,,,,,

## 2019-03-14 NOTE — Transfer of Care (Signed)
Immediate Anesthesia Transfer of Care Note  Patient: Melissa Benson  Procedure(s) Performed: RELEASE TRIGGER FINGER/A-1 PULLEY LEFT LONG FINGER (Left Hand)  Patient Location: PACU  Anesthesia Type:MAC and Bier block  Level of Consciousness: awake, alert , oriented and patient cooperative  Airway & Oxygen Therapy: Patient Spontanous Breathing and Patient connected to face mask oxygen  Post-op Assessment: Report given to RN, Post -op Vital signs reviewed and stable and Patient moving all extremities  Post vital signs: Reviewed and stable  Last Vitals:  Vitals Value Taken Time  BP 90/69 03/14/19 1030  Temp    Pulse 73 03/14/19 1032  Resp 14 03/14/19 1032  SpO2 96 % 03/14/19 1032  Vitals shown include unvalidated device data.  Last Pain:  Vitals:   03/14/19 0851  TempSrc: Oral  PainSc: 3       Patients Stated Pain Goal: 2 (76/81/15 7262)  Complications: No apparent anesthesia complications

## 2019-03-14 NOTE — H&P (Signed)
Melissa Benson is an 59 y.o. female.   Chief Complaint: left long finger trigger digit HPI: 59 yo female with triggering left long finger.  This is bothersome and she wishes to proceed with surgical release.  Allergies:  Allergies  Allergen Reactions  . Morphine And Related Nausea And Vomiting    Pt states also caused arm to turn black where medication was administered   . Pantoprazole Hives  . Tyloxapol Nausea And Vomiting    Past Medical History:  Diagnosis Date  . Anxiety   . Complication of anesthesia   . Diabetes mellitus   . GERD (gastroesophageal reflux disease)   . Headache    no issues since stop caffeine  . History of hiatal hernia   . History of migraine headaches   . History of urinary tract infection    last one 11/2014   . History of vertigo   . Hyperlipidemia   . Hypertension   . Neck pain, chronic   . OSA (obstructive sleep apnea)    on CPAP  . PONV (postoperative nausea and vomiting)   . Psoriasis   . Rosacea   . Weak urinary stream     Past Surgical History:  Procedure Laterality Date  . 2D ECHOCARDIOGRAM  07/20/2011   EF >55%, normal  . ABDOMINAL HYSTERECTOMY    . APPENDECTOMY    . CARDIOVASCULAR STRESS TEST  07/20/2011   Normal myocardial perfusion study, EKG negative for ischemia, no ECG changes  . CHOLECYSTECTOMY    . colonscopy      polyps removed   . DIAGNOSTIC LAPAROSCOPY    . DILATION AND CURETTAGE OF UTERUS    . LAPAROSCOPIC ROUX-EN-Y GASTRIC BYPASS WITH HIATAL HERNIA REPAIR N/A 01/12/2015   Procedure: LAPAROSCOPIC ROUX-EN-Y GASTRIC BYPASS WITH LYSIS OF ADHESIONS ;  Surgeon: Melissa Murphy, MD;  Location: [facility];  Service: General;  Laterality: N/A;  . LAPAROSCOPY  02/01/2017   Laparoscopic lysis of adhesions  . LAPAROSCOPY N/A 02/01/2017   Procedure: LAPAROSCOPY Lysis of adhesions;  Surgeon: Melissa Bue, MD;  Location: [facility];  Service: General;  Laterality: N/A;  . LAPAROTOMY N/A 02/01/2017   Procedure: excision of umbilical  mass, small bowel resection;  Surgeon: Melissa Bue, MD;  Location: [facility] OR;  Service: General;  Laterality: N/A;  . SPINE SURGERY     C6-C7 fusion x2  . SPINE SURGERY     herniated disc L4-L5  . TUBAL LIGATION      Family History: Family History  Problem Relation Age of Onset  . Diabetes Mother   . Heart disease Mother   . Hypertension Mother   . Lung disease Father   . Heart disease Father   . Hypertension Father   . Diabetes Father   . Hypertension Sister   . Hyperlipidemia Brother   . Hypertension Brother   . Heart attack Maternal Grandmother   . Heart disease Maternal Grandmother   . Hypertension Maternal Grandfather   . Diabetes Maternal Grandfather   . Heart disease Maternal Grandfather     Social History:   reports that she quit smoking about 35 years ago. Her smoking use included cigarettes. She has a 20.00 pack-year smoking history. She has never used smokeless tobacco. She reports that she does not drink alcohol or use drugs.  Medications: Medications Prior to Admission  Medication Sig Dispense Refill  . ALPRAZolam (XANAX) 0.5 MG tablet Take 1 tablet (0.5 mg total) by mouth at bedtime as needed for anxiety. 60  tablet 2  . b complex vitamins capsule Take 1 capsule by mouth daily.    . Biotin 10000 MCG TBDP Take 1 capsule by mouth daily.     . calcium-vitamin D 250-100 MG-UNIT tablet Take 1 tablet by mouth 3 (three) times daily.     Marland Kitchen docusate sodium (COLACE) 100 MG capsule Take 200 mg by mouth 2 (two) times daily.    Marland Kitchen gabapentin (NEURONTIN) 300 MG capsule Take 2 capsules 3 times a day 180 capsule 3  . MAGNESIUM PO Take 500 mg by mouth daily.     . multivitamin (VIT W/EXTRA C) CHEW chewable tablet Chew 1 tablet by mouth daily.    . promethazine (PHENERGAN) 25 MG tablet TAKE 1 TABLET(25 MG) BY MOUTH EVERY 8 HOURS AS NEEDED FOR NAUSEA OR VOMITING 30 tablet 2  . ramipril (ALTACE) 5 MG capsule TAKE 1 CAPSULE(5 MG) BY MOUTH DAILY 90 capsule 3  . rosuvastatin  (CRESTOR) 5 MG tablet TAKE 1 TABLET(5 MG) BY MOUTH DAILY 90 tablet 3  . sucralfate (CARAFATE) 1 g tablet Take 1 g by mouth 4 (four) times daily.    Marland Kitchen VICTOZA 18 MG/3ML SOPN Inject 0.2 mLs (1.2 mg total) into the skin daily. Inject once daily at the same time 2 pen 3  . BAYER CONTOUR NEXT TEST test strip USE AS INSTRUCTED TO CHECK BLOOD SUGAR THREE TIMES DAILY 100 each prn  . cyclobenzaprine (FLEXERIL) 10 MG tablet Take 1 tablet (10 mg total) by mouth at bedtime. 30 tablet 11  . Insulin Pen Needle 31G X 5 MM MISC Use on Victoza pen qd 100 each 1  . valACYclovir (VALTREX) 500 MG tablet Take 500 mg by mouth 2 (two) times daily as needed (cold sores outbreak).      No results found for this or any previous visit (from the past 48 hour(s)).  No results found.   A comprehensive review of systems was negative.  Height 5' 4.5" (1.638 m), weight 90.7 kg.  General appearance: alert, cooperative and appears stated age Head: Normocephalic, without obvious abnormality, atraumatic Neck: supple, symmetrical, trachea midline Cardio: regular rate and rhythm Resp: clear to auscultation bilaterally Extremities: Intact sensation and capillary refill all digits.  +epl/fpl/io.  No wounds.  Pulses: 2+ and symmetric Skin: Skin color, texture, turgor normal. No rashes or lesions Neurologic: Grossly normal Incision/Wound: none  Assessment/Plan Left long finger trigger digit.  Non operative and operative treatment options have been discussed with the patient and patient wishes to proceed with operative treatment. Risks, benefits, and alternatives of surgery have been discussed and the patient agrees with the plan of care.   Melissa Benson 03/14/2019, 8:36 AM

## 2019-03-14 NOTE — Anesthesia Preprocedure Evaluation (Addendum)
Anesthesia Evaluation  Patient identified by MRN, date of birth, ID band Patient awake    Reviewed: Allergy & Precautions, H&P , Patient's Chart, lab work & pertinent test results  History of Anesthesia Complications (+) PONV and history of anesthetic complications  Airway Mallampati: I  TM Distance: >3 FB Neck ROM: full    Dental  (+) Dental Advisory Given   Pulmonary sleep apnea , former smoker,    Pulmonary exam normal        Cardiovascular hypertension, Pt. on medications Normal cardiovascular exam Rhythm:Regular  02/2015 Nuclear stress EF: 62%. The study is normal. This is a low risk study. The left ventricular ejection fraction is normal (55-65%).   Neuro/Psych  Headaches, History of vertigo negative psych ROS   GI/Hepatic hiatal hernia, GERD  ,  Endo/Other  diabetes, Oral Hypoglycemic Agents  Renal/GU      Musculoskeletal HLD   Abdominal (+) + obese,   Peds  Hematology HLD   Anesthesia Other Findings internal hernia    Reproductive/Obstetrics                           Anesthesia Physical  Anesthesia Plan  ASA: III  Anesthesia Plan: MAC   Post-op Pain Management:    Induction: Intravenous  PONV Risk Score and Plan: 3 and Ondansetron, Dexamethasone, Propofol infusion and Treatment may vary due to age or medical condition  Airway Management Planned: Natural Airway  Additional Equipment:   Intra-op Plan:   Post-operative Plan:   Informed Consent: I have reviewed the patients History and Physical, chart, labs and discussed the procedure including the risks, benefits and alternatives for the proposed anesthesia with the patient or authorized representative who has indicated his/her understanding and acceptance.     Dental advisory given  Plan Discussed with: CRNA  Anesthesia Plan Comments:       Anesthesia Quick Evaluation

## 2019-03-14 NOTE — Op Note (Signed)
03/14/2019 [facility]  Operative Note  PREOPERATIVE DIAGNOSIS: LEFT LONG TRIGGER DIGIT  POSTOPERATIVE DIAGNOSIS:  LEFT LONG TRIGGER DIGIT  PROCEDURE: Procedure(s): RELEASE TRIGGER FINGER/A-1 PULLEY LEFT LONG FINGER  SURGEON:  Betha Loa, MD  ASSISTANT:  none.  ANESTHESIA:  Bier block with sedation.  IV FLUIDS:  Per anesthesia flow sheet.  ESTIMATED BLOOD LOSS:  Minimal.  COMPLICATIONS:  None.  SPECIMENS:  None.  TOURNIQUET TIME:  Total Tourniquet Time Documented: Forearm (Left) - 18 minutes Total: Forearm (Left) - 18 minutes   DISPOSITION:  Stable to PACU.  LOCATION: [facility]  INDICATIONS: Melissa Benson is a 59 y.o. female 59 yo female with triggering left long finger.  She wishes to have left long finger trigger release.  Risks, benefits and alternatives of surgery were discussed including the risk of blood loss, infection, damage to nerves, vessels, tendons, ligaments, bone, failure of surgery, need for additional surgery, complications with wound healing, continued pain, continued triggering and need for repeat surgery.  She voiced understanding of these risks and elected to proceed.  OPERATIVE COURSE:  After being identified preoperatively by myself, the patient and I agreed upon the procedure and site of procedure.  The surgical site was marked. The risks, benefits, and alternatives of surgery were reviewed and she wished to proceed.  Surgical consent had been signed. She was given IV Ancef as preoperative antibiotic prophylaxis. She was transported to the operating room and placed on the operating room table in supine position with the Left upper extremity on an arm board. Bier block anesthesia was induced by the anesthesiologist.  The Left upper extremity was prepped and draped in normal sterile orthopedic fashion. A surgical pause was performed between surgeons, anesthesia, and operating room staff, and all were in agreement as to the  patient, procedure, and site of procedure.  Tourniquet at the proximal aspect of the forearm had been inflated for the Bier block.  An incision was made at the volar aspect of the MP joint of the long finger.  This was carried into the subcutaneous tissues by preading technique.  Bipolar electrocautery was used to obtain hemostasis.  The radial and ulnar digital nerves were protected throughout the case. The flexor sheath was identified.  The A1 pulley was identified and sharply incised.  It was released in its entirety.  The proximal 1-2 mm of the A2 pulley was vented to allow better excursion of the tendons.  The finger was placed through a range of motion and there was noted to be no catching.  The tendons were brought through the wound and any adherences released.  The wound was then copiously irrigated with sterile saline. It was closed with 4-0 nylon in a horizontal mattress fashion.  It was injected with 0.25% plain Marcaine to aid in postoperative analgesia.  It was dressed with sterile Xeroform, 4x4s, and wrapped lightly with a Coban dressing.  Tourniquet was deflated at 18 minutes.  The fingertips were pink with brisk capillary refill after deflation of the tourniquet.  The operative drapes were broken down and the patient was awoken from anesthesia safely.  She was transferred back to the stretcher and taken to the PACU in stable condition.   I will see her back in the office in 1 week for postoperative followup.  I will give her a prescription for Norco 5/325 1-2 tabs PO q6 hours prn pain, dispense # 20.    Betha Loa, MD Electronically signed, 03/14/19

## 2019-03-14 NOTE — Anesthesia Postprocedure Evaluation (Signed)
Anesthesia Post Note  Patient: Melissa Benson  Procedure(s) Performed: RELEASE TRIGGER FINGER/A-1 PULLEY LEFT LONG FINGER (Left Hand)     Patient location during evaluation: PACU Anesthesia Type: Bier Block Level of consciousness: awake and alert Pain management: pain level controlled Vital Signs Assessment: post-procedure vital signs reviewed and stable Respiratory status: spontaneous breathing Cardiovascular status: stable Anesthetic complications: no    Last Vitals:  Vitals:   03/14/19 1100 03/14/19 1115  BP:  129/61  Pulse: 74 67  Resp: 11 16  Temp:  36.9 C  SpO2: 97% 96%    Last Pain:  Vitals:   03/14/19 1115  TempSrc:   PainSc: 0-No pain                 Lewie Loron

## 2019-03-15 ENCOUNTER — Encounter: Payer: Self-pay | Admitting: *Deleted

## 2019-03-19 ENCOUNTER — Other Ambulatory Visit: Payer: Self-pay

## 2019-03-19 ENCOUNTER — Other Ambulatory Visit (INDEPENDENT_AMBULATORY_CARE_PROVIDER_SITE_OTHER): Payer: BC Managed Care – PPO

## 2019-03-19 DIAGNOSIS — E1165 Type 2 diabetes mellitus with hyperglycemia: Secondary | ICD-10-CM

## 2019-03-19 LAB — BASIC METABOLIC PANEL
BUN: 13 mg/dL (ref 6–23)
CO2: 29 mEq/L (ref 19–32)
Calcium: 9.5 mg/dL (ref 8.4–10.5)
Chloride: 103 mEq/L (ref 96–112)
Creatinine, Ser: 0.68 mg/dL (ref 0.40–1.20)
GFR: 88.49 mL/min (ref >60.00)
Glucose, Bld: 117 mg/dL — ABNORMAL HIGH (ref 70–99)
Potassium: 4.1 mEq/L (ref 3.5–5.1)
Sodium: 138 mEq/L (ref 135–145)

## 2019-03-20 LAB — FRUCTOSAMINE: Fructosamine: 263 umol/L (ref 0–285)

## 2019-03-21 ENCOUNTER — Encounter: Payer: Self-pay | Admitting: Endocrinology

## 2019-03-21 ENCOUNTER — Other Ambulatory Visit: Payer: Self-pay

## 2019-03-21 ENCOUNTER — Ambulatory Visit: Payer: BC Managed Care – PPO | Admitting: Endocrinology

## 2019-03-21 VITALS — BP 132/74 | HR 76 | Ht 65.0 in | Wt 194.6 lb

## 2019-03-21 DIAGNOSIS — E1165 Type 2 diabetes mellitus with hyperglycemia: Secondary | ICD-10-CM

## 2019-03-21 MED ORDER — GLUCOSE BLOOD VI STRP: ORAL_STRIP | 12 refills | Status: DC

## 2019-03-21 NOTE — Patient Instructions (Signed)
Check blood sugars on waking up 2-3 days a week  Also check blood sugars about 2 hours after meals and do this after different meals by rotation  Recommended blood sugar levels on waking up are 90-130 and about 2 hours after meal is 130-160  Please bring your blood sugar monitor to each visit, thank you   

## 2019-03-21 NOTE — Progress Notes (Signed)
Patient ID: Melissa Benson, female   DOB: [birth year], 59 y.o.   MRN: [id number]           Reason for Appointment: Follow-up for Type 2 Diabetes   History of Present Illness:          Date of diagnosis of type 2 diabetes mellitus: 2009         Previous history: She had been on various oral hypoglycemic drugs in the past and was on relatively large doses of basal insulin. 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 she was able to stop insulin A1c had gone down to 6% in 2018 but was followed only about once a year She was tried on Metformin alone in 2018 for about a year but this was then stopped  Recent history:   Most recent A1c is 7.2 done on    01/08/2019 Fructosamine is 263  Non-insulin hypoglycemic drugs the patient is taking are: Victoza 1.2 mg daily after dinner   Current management, blood sugar patterns and problems identified:  She has been on Victoza since 2/21  Previously was on glipizide but she has had variable control and progressively higher weight  She had used Victoza previously without side effect  Currently she is able to tolerate 1.2 mg daily and has no nausea or GI side effects with this  She is able to get more satiety with this  Also her blood sugars at home look better  However she thinks she is still is using expired test strips and did not get a new prescription  Glipizide was stopped  She is trying to walk when the weather allows her although not consistently  Most of her meals are low-fat although sometimes has no carbohydrate, may have cereal in the morning        Side effects from medications have been: Nausea, some diarrhea from Metformin, some nausea from glipizide       Typical meal intake: Breakfast is usually a protein shake, usually having only almonds and pickles at lunch,  will have chicken and vegetables at dinner, snacks will be cheese crackers             Exercise:  Walking up to 30 minutes;  limited by back pain  Glucose monitoring:  done  times a day         Glucometer:Contour .       Blood Glucose readings by time of day and averages from meter download:  Blood sugar by recall: Am 114-130, 155; acl 90; pcs about 150 Previous readings:  PREMEAL Breakfast Lunch Dinner Bedtime  Overall   Glucose range:  118-161  100-143     Median:  147     128   Dietician visit, most recent: 2017,  Weight history:  Wt Readings from Last 3 Encounters:  03/21/19 194 lb 9.6 oz (88.3 kg)  03/14/19 195 lb 15.8 oz (88.9 kg)  02/15/19 203 lb (92.1 kg)    Glycemic control:   Lab Results  Component Value Date   HGBA1C 7.2 (H) 01/08/2019   HGBA1C 7.4 (H) 11/22/2018   HGBA1C 6.9 (H) 07/03/2018   Lab Results  Component Value Date   MICROALBUR 0.4 05/22/2018   LDLCALC 54 11/22/2018   CREATININE 0.68 03/19/2019   Lab Results  Component Value Date   MICRALBCREAT 4 05/22/2018  Lab Results  Component Value Date   FRUCTOSAMINE 263 03/19/2019    Lab on 03/19/2019  Component Date Value Ref Range Status  . Fructosamine 03/19/2019 263  0 - 285 umol/L Final   Comment: Published reference interval for apparently healthy subjects between age 67 and 77 is 18 - 285 umol/L and in a poorly controlled diabetic population is 228 - 563 umol/L with a mean of 396 umol/L.   Marland Kitchen Sodium 03/19/2019 138  135 - 145 mEq/L Final  . Potassium 03/19/2019 4.1  3.5 - 5.1 mEq/L Final  . Chloride 03/19/2019 103  96 - 112 mEq/L Final  . CO2 03/19/2019 29  19 - 32 mEq/L Final  . Glucose, Bld 03/19/2019 117* 70 - 99 mg/dL Final  . BUN 70/35/0093 13  6 - 23 mg/dL Final  . Creatinine, Ser 03/19/2019 0.68  0.40 - 1.20 mg/dL Final  . GFR 81/82/9937 88.49  >60.00 mL/min Final  . Calcium 03/19/2019 9.5  8.4 - 10.5 mg/dL Final    Allergies as of 03/21/2019      Reactions   Morphine And  Related Nausea And Vomiting   Pt states also caused arm to turn black where medication was administered    Pantoprazole Hives   Other Nausea And Vomiting   TYLOX      Medication List       Accurate as of March 21, 2019 10:33 AM. If you have any questions, ask your nurse or doctor.        ALPRAZolam 0.5 MG tablet Commonly known as: Xanax Take 1 tablet (0.5 mg total) by mouth at bedtime as needed for anxiety.   b complex vitamins capsule Take 1 capsule by mouth daily.   Bayer Contour Next Test test strip Generic drug: glucose blood USE AS INSTRUCTED TO CHECK BLOOD SUGAR THREE TIMES DAILY   Biotin 16967 MCG Tbdp Take 1 capsule by mouth daily.   calcium-vitamin D 250-100 MG-UNIT tablet Take 1 tablet by mouth 3 (three) times daily.   cyclobenzaprine 10 MG tablet Commonly known as: FLEXERIL Take 1 tablet (10 mg total) by mouth at bedtime.   docusate sodium 100 MG capsule Commonly known as: COLACE Take 200 mg by mouth 2 (two) times daily.   gabapentin 300 MG capsule Commonly known as: NEURONTIN Take 2 capsules 3 times a day   HYDROcodone-acetaminophen 5-325 MG tablet Commonly known as: Norco 1-2 tabs po q6 hours prn pain   Insulin Pen Needle 31G X 5 MM Misc Use on Victoza pen qd   MAGNESIUM PO Take 500 mg by mouth daily.   multivitamin Chew chewable tablet Chew 1 tablet by mouth daily.   promethazine 25 MG tablet Commonly known as: PHENERGAN TAKE 1 TABLET(25 MG) BY MOUTH EVERY 8 HOURS AS NEEDED FOR NAUSEA OR VOMITING   ramipril 5 MG capsule Commonly known as: ALTACE TAKE 1 CAPSULE(5 MG) BY MOUTH DAILY   rosuvastatin 5 MG tablet Commonly known as: CRESTOR TAKE 1 TABLET(5 MG) BY MOUTH DAILY   sucralfate 1 g tablet Commonly known as: CARAFATE Take 1 g by mouth 4 (four) times daily.   valACYclovir 500 MG tablet Commonly known as: VALTREX Take 500 mg by mouth 2 (two) times daily as needed (cold sores outbreak).   Victoza 18 MG/3ML Sopn Generic drug:  liraglutide Inject 0.2 mLs (1.2 mg total) into the skin daily. Inject once daily at the same time       Allergies:  Allergies  Allergen Reactions  . Morphine And  Related Nausea And Vomiting    Pt states also caused arm to turn black where medication was administered   . Pantoprazole Hives  . Other Nausea And Vomiting    TYLOX    Past Medical History:  Diagnosis Date  . Anxiety   . Complication of anesthesia   . Diabetes mellitus   . GERD (gastroesophageal reflux disease)   . Headache    no issues since stop caffeine  . History of hiatal hernia   . History of migraine headaches   . History of urinary tract infection    last one 11/2014   . History of vertigo   . Hyperlipidemia   . Hypertension   . Neck pain, chronic   . OSA (obstructive sleep apnea)    on CPAP  . PONV (postoperative nausea and vomiting)   . Psoriasis   . Rosacea   . Weak urinary stream     Past Surgical History:  Procedure Laterality Date  . 2D ECHOCARDIOGRAM  07/20/2011   EF >55%, normal  . ABDOMINAL HYSTERECTOMY    . APPENDECTOMY    . CARDIOVASCULAR STRESS TEST  07/20/2011   Normal myocardial perfusion study, EKG negative for ischemia, no ECG changes  . CHOLECYSTECTOMY    . colonscopy      polyps removed   . DIAGNOSTIC LAPAROSCOPY    . DILATION AND CURETTAGE OF UTERUS    . LAPAROSCOPIC ROUX-EN-Y GASTRIC BYPASS WITH HIATAL HERNIA REPAIR N/A 01/12/2015   Procedure: LAPAROSCOPIC ROUX-EN-Y GASTRIC BYPASS WITH LYSIS OF ADHESIONS ;  Surgeon: Johnathan Hausen, MD;  Location: [facility];  Service: General;  Laterality: N/A;  . LAPAROSCOPY  02/01/2017   Laparoscopic lysis of adhesions  . LAPAROSCOPY N/A 02/01/2017   Procedure: LAPAROSCOPY Lysis of adhesions;  Surgeon: Clovis Riley, MD;  Location: [place];  Service: General;  Laterality: N/A;  . LAPAROTOMY N/A 02/01/2017   Procedure: excision of umbilical mass, small bowel resection;  Surgeon: Clovis Riley, MD;  Location: [facility];  Service: General;   Laterality: N/A;  . SPINE SURGERY     C6-C7 fusion x2  . SPINE SURGERY     herniated disc L4-L5  . TRIGGER FINGER RELEASE Left 03/14/2019   Procedure: RELEASE TRIGGER FINGER/A-1 PULLEY LEFT LONG FINGER;  Surgeon: Leanora Cover, MD;  Location: [place];  Service: Orthopedics;  Laterality: Left;  Bier block  . TUBAL LIGATION      Family History  Problem Relation Age of Onset  . Diabetes Mother   . Heart disease Mother   . Hypertension Mother   . Lung disease Father   . Heart disease Father   . Hypertension Father   . Diabetes Father   . Hypertension Sister   . Hyperlipidemia Brother   . Hypertension Brother   . Heart attack Maternal Grandmother   . Heart disease Maternal Grandmother   . Hypertension Maternal Grandfather   . Diabetes Maternal Grandfather   . Heart disease Maternal Grandfather     Social History:  reports that she quit smoking about 35 years ago. Her smoking use included cigarettes. She has a 20.00 pack-year smoking history. She has never used smokeless tobacco. She reports that she does not drink alcohol or use drugs.   Review of Systems    Lipid history: Has been treated for cardiovascular risk reduction with Crestor 5 mg daily    Lab Results  Component Value Date   CHOL 127 11/22/2018   HDL 54 11/22/2018   [place]  54 11/22/2018   TRIG 107 11/22/2018   CHOLHDL 2.4 11/22/2018           Hypertension: Has been on ramipril 5 mg daily  BP Readings from Last 3 Encounters:  03/21/19 132/74  03/14/19 129/61  02/05/19 134/72    Most recent eye exam was in 12/20  Most recent foot exam: 2/21  Currently known complications of diabetes: None known  OSTEOPENIA: T score is -1.4 at the hip  LABS:  Lab on 03/19/2019  Component Date Value Ref Range Status  . Fructosamine 03/19/2019 263  0 - 285 umol/L Final   Comment: Published reference interval for apparently healthy subjects between age 79 and 65 is 47 - 285 umol/L and in a  poorly controlled diabetic population is 228 - 563 umol/L with a mean of 396 umol/L.   Marland Kitchen Sodium 03/19/2019 138  135 - 145 mEq/L Final  . Potassium 03/19/2019 4.1  3.5 - 5.1 mEq/L Final  . Chloride 03/19/2019 103  96 - 112 mEq/L Final  . CO2 03/19/2019 29  19 - 32 mEq/L Final  . Glucose, Bld 03/19/2019 117* 70 - 99 mg/dL Final  . BUN 38/32/9191 13  6 - 23 mg/dL Final  . Creatinine, Ser 03/19/2019 0.68  0.40 - 1.20 mg/dL Final  . GFR 66/06/43 88.49  >60.00 mL/min Final  . Calcium 03/19/2019 9.5  8.4 - 10.5 mg/dL Final    Physical Examination:  BP 132/74   Pulse 76   Ht 5\' 5"  (1.651 m)   Wt 194 lb 9.6 oz (88.3 kg)   SpO2 96%   BMI 32.38 kg/m         ASSESSMENT:  Diabetes type 2  See history of present illness for detailed discussion of current diabetes management, blood sugar patterns and problems identified  Last A1c is mildly increased at 7.2  As above with Victoza her blood sugars are improving as judged by her fructosamine of 263 Home blood sugars are also mostly within target range She is losing weight compared to glipizide and has no side effects with 1.2 mg Victoza   PLAN:    . Glucose monitoring: New prescription for test strips has been sent To check readings after meals also alternating with fasting   Medications: She will stay on 1.2 mg Victoza, unless her A1c is significantly high or progressively increasing we will not go up to 1.8 mg as yet especially with her history of chronic GI symptoms   There are no Patient Instructions on file for this visit.     03/21/2019, 10:33 AM   Note: This office note was prepared with Dragon voice recognition system technology. Any transcriptional errors that result from this process are unintentional.

## 2019-04-01 ENCOUNTER — Telehealth: Payer: Self-pay | Admitting: Internal Medicine

## 2019-04-01 MED ORDER — GABAPENTIN 300 MG PO CAPS: ORAL_CAPSULE | ORAL | 1 refills | Status: DC

## 2019-04-01 NOTE — Telephone Encounter (Signed)
Received Fax RX request from  Pharmacy [id number] - [place], [place] - [address] AT [facility] [address] & [facility] [address] Phone:  [phone]  Fax:  [phone]       Medication - gabapentin (NEURONTIN) 300 MG capsule   Last Refill - 03/02/19  Last OV - 02/15/19  Last CPE - 11/27/18  Next Appointment - 05/16/19

## 2019-04-25 ENCOUNTER — Ambulatory Visit: Payer: BC Managed Care – PPO | Admitting: Neurology

## 2019-05-14 ENCOUNTER — Other Ambulatory Visit: Payer: Self-pay

## 2019-05-14 ENCOUNTER — Other Ambulatory Visit: Payer: BC Managed Care – PPO | Admitting: Internal Medicine

## 2019-05-14 DIAGNOSIS — E119 Type 2 diabetes mellitus without complications: Secondary | ICD-10-CM

## 2019-05-15 LAB — HEMOGLOBIN A1C
Hgb A1c MFr Bld: 6.2 % of total Hgb — ABNORMAL HIGH (ref <5.7)
Mean Plasma Glucose: 131 (calc)
eAG (mmol/L): 7.3 (calc)

## 2019-05-16 ENCOUNTER — Ambulatory Visit (INDEPENDENT_AMBULATORY_CARE_PROVIDER_SITE_OTHER): Payer: BC Managed Care – PPO | Admitting: Internal Medicine

## 2019-05-16 ENCOUNTER — Other Ambulatory Visit: Payer: Self-pay

## 2019-05-16 VITALS — BP 130/80 | HR 68 | Ht 65.0 in | Wt 188.0 lb

## 2019-05-16 DIAGNOSIS — M542 Cervicalgia: Secondary | ICD-10-CM | POA: Diagnosis not present

## 2019-05-16 DIAGNOSIS — E1169 Type 2 diabetes mellitus with other specified complication: Secondary | ICD-10-CM | POA: Diagnosis not present

## 2019-05-16 DIAGNOSIS — Z8659 Personal history of other mental and behavioral disorders: Secondary | ICD-10-CM

## 2019-05-16 DIAGNOSIS — Z6831 Body mass index (BMI) 31.0-31.9, adult: Secondary | ICD-10-CM

## 2019-05-16 DIAGNOSIS — Z9884 Bariatric surgery status: Secondary | ICD-10-CM

## 2019-05-16 DIAGNOSIS — E785 Hyperlipidemia, unspecified: Secondary | ICD-10-CM

## 2019-05-16 DIAGNOSIS — S22000S Wedge compression fracture of unspecified thoracic vertebra, sequela: Secondary | ICD-10-CM

## 2019-05-16 DIAGNOSIS — G8929 Other chronic pain: Secondary | ICD-10-CM

## 2019-05-16 NOTE — Progress Notes (Signed)
   Subjective:    Patient ID: Melissa Benson, female    DOB: 27-Apr-1960, 59 y.o.   MRN: [id number]  HPI 59 year old Female in today for follow-up.  In March had trigger finger release left long finger by Dr. Merlyn Lot.  Hemoglobin A1c stable at 6.2% and in January was 7.2%.  Continues on chronic pain medication Neurontin 300 mg 2 capsules 3 times a day for chronic thoracic back issue.  Had MRI neurosurgeon last year and no malalignment was found.  Had T2 compression fracture after syncopal episode November 2019.  Also has Flexeril to take at bedtime.  History of migraine headaches and frequent nausea treated with Phenergan as needed.  History of diabetes mellitus, hyperlipidemia, sleep apnea, hypertension.  Gastric bypass surgery January 2017.  Also has chronic neck pain and depression.  History of elevated liver enzymes.  Is on Crestor 5 mg daily for hyperlipidemia.  Has follow-up with Dr. Lucianne Muss soon regarding glucose intolerance and hyperlipidemia.  Had complete physical exam and lab studies done in November.      Review of Systems see above     Objective:   Physical Exam Blood pressure 130/80 BMI 31.28.  Weight 198 pounds.  Pulse 68 regular.  Pulse oximetry 98%.  Skin warm and dry.  Neck is supple without JVD thyromegaly or carotid bruits.  Chest clear.  Cardiac exam regular rate and rhythm normal S1 and S2 without murmurs.  No lower extremity pitting edema.  Affect thought and judgment appear to be normal.       Assessment & Plan:  Type 2 diabetes mellitus-followed with Dr. Lucianne Muss in stable  Chronic thoracic and neck pain.  Continue Flexeril.  Continue gabapentin.  History of T2 fracture due to fall and syncope.  History of sleep apnea  BMI 31.28-continue with diet and exercise efforts.  History of gastric bypass surgery.  Hyperlipidemia-continue Crestor 5 mg daily.  Anxiety and insomnia treated with Xanax 0.5 mg at bedtime.  Status post trigger finger release and  doing well.  Plan: Continue diet exercise and weight loss efforts.  Continue current medications.  See Dr. Lucianne Muss in the near future.  Follow-up for health maintenance exam and fasting labs in 6 months.

## 2019-05-28 ENCOUNTER — Other Ambulatory Visit: Payer: Self-pay

## 2019-05-28 ENCOUNTER — Other Ambulatory Visit: Payer: Self-pay | Admitting: Endocrinology

## 2019-05-28 ENCOUNTER — Other Ambulatory Visit (INDEPENDENT_AMBULATORY_CARE_PROVIDER_SITE_OTHER): Payer: BC Managed Care – PPO

## 2019-05-28 DIAGNOSIS — E782 Mixed hyperlipidemia: Secondary | ICD-10-CM

## 2019-05-28 DIAGNOSIS — E1165 Type 2 diabetes mellitus with hyperglycemia: Secondary | ICD-10-CM

## 2019-05-28 LAB — COMPREHENSIVE METABOLIC PANEL
ALT: 16 U/L (ref 0–35)
AST: 21 U/L (ref 0–37)
Albumin: 4.4 g/dL (ref 3.5–5.2)
Alkaline Phosphatase: 74 U/L (ref 39–117)
BUN: 13 mg/dL (ref 6–23)
CO2: 29 mEq/L (ref 19–32)
Calcium: 9.6 mg/dL (ref 8.4–10.5)
Chloride: 105 mEq/L (ref 96–112)
Creatinine, Ser: 0.71 mg/dL (ref 0.40–1.20)
GFR: 84.13 mL/min (ref >60.00)
Glucose, Bld: 162 mg/dL — ABNORMAL HIGH (ref 70–99)
Potassium: 3.9 mEq/L (ref 3.5–5.1)
Sodium: 141 mEq/L (ref 135–145)
Total Bilirubin: 0.5 mg/dL (ref 0.2–1.2)
Total Protein: 6.9 g/dL (ref 6.0–8.3)

## 2019-05-28 LAB — LIPID PANEL
Cholesterol: 114 mg/dL (ref 0–200)
HDL: 45.4 mg/dL (ref >39.00)
LDL Cholesterol: 51 mg/dL (ref 0–99)
NonHDL: 68.77
Total CHOL/HDL Ratio: 3
Triglycerides: 88 mg/dL (ref 0.0–149.0)
VLDL: 17.6 mg/dL (ref 0.0–40.0)

## 2019-05-30 ENCOUNTER — Encounter: Payer: Self-pay | Admitting: Endocrinology

## 2019-05-30 ENCOUNTER — Other Ambulatory Visit: Payer: Self-pay

## 2019-05-30 ENCOUNTER — Ambulatory Visit: Payer: BC Managed Care – PPO | Admitting: Endocrinology

## 2019-05-30 VITALS — BP 120/70 | HR 84 | Ht 65.0 in | Wt 184.2 lb

## 2019-05-30 DIAGNOSIS — E669 Obesity, unspecified: Secondary | ICD-10-CM | POA: Diagnosis not present

## 2019-05-30 DIAGNOSIS — E1169 Type 2 diabetes mellitus with other specified complication: Secondary | ICD-10-CM | POA: Diagnosis not present

## 2019-05-30 NOTE — Patient Instructions (Signed)
Check blood sugars on waking up 3 days a week  Also check blood sugars about 2 hours after meals and do this after different meals by rotation  Recommended blood sugar levels on waking up are 90-130 and about 2 hours after meal is 130-160  Please bring your blood sugar monitor to each visit, thank you   

## 2019-05-30 NOTE — Progress Notes (Signed)
Patient ID: Melissa Benson, female   DOB: 1960/07/07, 59 y.o.   MRN: [id number]           Reason for Appointment: Follow-up for Type 2 Diabetes   History of Present Illness:          Date of diagnosis of type 2 diabetes mellitus: 2009         Previous history: She had been on various oral hypoglycemic drugs in the past and was on relatively large doses of basal insulin. 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 she was able to stop insulin A1c had gone down to 6% in 2018 but was followed only about once a year She was tried on Metformin alone in 2018 for about a year but this was then stopped  Recent history:   A1c is improved to 6.2 compared to 7.2  Non-insulin hypoglycemic drugs the patient is taking are: Victoza 1.2 mg daily after dinner   Current management, blood sugar patterns and problems identified:  She has been on Victoza since 2/21, now on monotherapy with this  Previously was on glipizide but she has had variable control and progressively higher weight  She does think that occasionally may forget to take her Victoza at bedtime  Her prescription for the strips was sent for the old Contour meter and she was not able to use this and is still is using expired test strips  However these seem to be relatively accurate still  She is checking blood sugars up to twice a day  She will have variable readings in the mornings and lab glucose was 162  However usually blood sugars are excellent rest of the night  Depending on her carbohydrate intake blood sugars at bedtime may be low normal  She may actually feel a little hypoglycemic if blood sugars are below 90  Recently because of involvement with the family she has not done any regular formal walking but she thinks that she is overall still fairly  active  She is usually consistent with monitoring her portions, carbohydrates and fat intake  Weight is down 10 pounds since March        Side effects from medications have been: Nausea, some diarrhea from Metformin, some nausea from glipizide       Typical meal intake: Breakfast is usually a protein shake, usually having only almonds and pickles at lunch, will have chicken and vegetables at dinner, snacks will be cheese crackers             Exercise:  Walking up to 30 minutes;  limited by back pain  Glucose monitoring:  done  times a day         Glucometer:Contour .       Blood Glucose readings by time of day and averages from meter download:   PRE-MEAL Fasting Lunch Dinner Bedtime Overall  Glucose range:  108-198  95-149   80-157   Mean/median:  144  117   115    Previous readings: Blood sugar by recall: Am 114-130, 155; acl 90; pcs about 150  Dietician visit, most recent: 2017,  Weight history:  Wt Readings from Last 3 Encounters:  05/30/19 184 lb 3.2 oz (83.6 kg)  05/16/19 188 lb (85.3 kg)  03/21/19 194 lb 9.6 oz (88.3 kg)    Glycemic control:   Lab Results  Component Value Date   HGBA1C 6.2 (H) 05/14/2019   HGBA1C 7.2 (H) 01/08/2019   HGBA1C 7.4 (H) 11/22/2018   Lab Results  Component Value Date   MICROALBUR 0.4 05/22/2018   LDLCALC 51 05/28/2019   CREATININE 0.71 05/28/2019   Lab Results  Component Value Date   MICRALBCREAT 4 05/22/2018    Lab Results  Component Value Date   FRUCTOSAMINE 263 03/19/2019    Lab on 05/28/2019  Component Date Value Ref Range Status  . Cholesterol 05/28/2019 114  0 - 200 mg/dL Final   ATP III Classification       Desirable:  < 200 mg/dL               Borderline High:  200 - 239 mg/dL          High:  > = 546 mg/dL  . Triglycerides 05/28/2019 88.0  0.0 - 149.0 mg/dL Final   Normal:  <503 mg/dLBorderline High:  150 - 199 mg/dL  . HDL 05/28/2019 45.40  >39.00 mg/dL Final  . VLDL 54/65/6812 17.6  0.0 - 40.0 mg/dL Final   . LDL Cholesterol 05/28/2019 51  0 - 99 mg/dL Final  . Total CHOL/HDL Ratio 05/28/2019 3   Final                  Men          Women1/2 Average Risk     3.4          3.3Average Risk          5.0          4.42X Average Risk          9.6          7.13X Average Risk          15.0          11.0                      . NonHDL 05/28/2019 68.77   Final   NOTE:  Non-HDL goal should be 30 mg/dL higher than patient's LDL goal (i.e. LDL goal of < 70 mg/dL, would have non-HDL goal of < 100 mg/dL)  . Sodium 05/28/2019 141  135 - 145 mEq/L Final  . Potassium 05/28/2019 3.9  3.5 - 5.1 mEq/L Final  . Chloride 05/28/2019 105  96 - 112 mEq/L Final  . CO2 05/28/2019 29  19 - 32 mEq/L Final  . Glucose, Bld 05/28/2019 162* 70 - 99 mg/dL Final  . BUN 75/17/0017 13  6 - 23 mg/dL Final  . Creatinine, Ser 05/28/2019 0.71  0.40 - 1.20 mg/dL Final  . Total Bilirubin 05/28/2019 0.5  0.2 - 1.2 mg/dL Final  . Alkaline Phosphatase 05/28/2019 74  39 - 117 U/L Final  . AST 05/28/2019 21  0 - 37 U/L Final  . ALT 05/28/2019 16  0 - 35 U/L Final  . Total Protein 05/28/2019 6.9  6.0 - 8.3 g/dL Final  . Albumin 49/44/9675 4.4  3.5 - 5.2 g/dL Final  . GFR 91/63/8466 84.13  >60.00 mL/min Final  . Calcium 05/28/2019 9.6  8.4 - 10.5 mg/dL Final    Allergies as of 05/30/2019      Reactions   Morphine And Related Nausea And Vomiting   Pt states also caused arm to turn black where medication was administered    Morphine Other (See Comments)   Other Nausea And Vomiting   TYLOX  Pantoprazole Hives, Rash      Medication List       Accurate as of May 30, 2019  8:39 AM. If you have any questions, ask your nurse or doctor.        STOP taking these medications   calcium-vitamin D 250-100 MG-UNIT tablet Stopped by: Reather LittlerAjay Jerie Basford, MD     TAKE these medications   ALPRAZolam 0.5 MG tablet Commonly known as: Xanax Take 1 tablet (0.5 mg total) by mouth at bedtime as needed for anxiety.   b complex vitamins capsule Take 1  capsule by mouth daily.   Biotin 0454010000 MCG Tbdp Take 1 capsule by mouth daily.   cyclobenzaprine 10 MG tablet Commonly known as: FLEXERIL Take 1 tablet (10 mg total) by mouth at bedtime. What changed:   when to take this  reasons to take this   docusate sodium 100 MG capsule Commonly known as: COLACE Take 200 mg by mouth 2 (two) times daily.   gabapentin 300 MG capsule Commonly known as: NEURONTIN Take 2 capsules 3 times a day   glucose blood test strip Use once daily   Insulin Pen Needle 31G X 5 MM Misc Use on Victoza pen qd   MAGNESIUM PO Take 500 mg by mouth daily.   multivitamin Chew chewable tablet Chew 1 tablet by mouth daily.   promethazine 25 MG tablet Commonly known as: PHENERGAN TAKE 1 TABLET(25 MG) BY MOUTH EVERY 8 HOURS AS NEEDED FOR NAUSEA OR VOMITING   ramipril 5 MG capsule Commonly known as: ALTACE TAKE 1 CAPSULE(5 MG) BY MOUTH DAILY   rosuvastatin 5 MG tablet Commonly known as: CRESTOR TAKE 1 TABLET(5 MG) BY MOUTH DAILY   sucralfate 1 g tablet Commonly known as: CARAFATE Take 1 g by mouth 4 (four) times daily.   valACYclovir 500 MG tablet Commonly known as: VALTREX Take 500 mg by mouth 2 (two) times daily as needed (cold sores outbreak).   Victoza 18 MG/3ML Sopn Generic drug: liraglutide Inject 0.2 mLs (1.2 mg total) into the skin daily. Inject once daily at the same time       Allergies:  Allergies  Allergen Reactions  . Morphine And Related Nausea And Vomiting    Pt states also caused arm to turn black where medication was administered   . Morphine Other (See Comments)  . Other Nausea And Vomiting    TYLOX  . Pantoprazole Hives and Rash    Past Medical History:  Diagnosis Date  . Anxiety   . Complication of anesthesia   . Diabetes mellitus   . GERD (gastroesophageal reflux disease)   . Headache    no issues since stop caffeine  . History of hiatal hernia   . History of migraine headaches   . History of urinary tract  infection    last one 11/2014   . History of vertigo   . Hyperlipidemia   . Hypertension   . Neck pain, chronic   . OSA (obstructive sleep apnea)    on CPAP  . PONV (postoperative nausea and vomiting)   . Psoriasis   . Rosacea   . Weak urinary stream     Past Surgical History:  Procedure Laterality Date  . 2D ECHOCARDIOGRAM  07/20/2011   EF >55%, normal  . ABDOMINAL HYSTERECTOMY    . APPENDECTOMY    . CARDIOVASCULAR STRESS TEST  07/20/2011   Normal myocardial perfusion study, EKG negative for ischemia, no ECG changes  . CHOLECYSTECTOMY    . colonscopy  polyps removed   . DIAGNOSTIC LAPAROSCOPY    . DILATION AND CURETTAGE OF UTERUS    . LAPAROSCOPIC ROUX-EN-Y GASTRIC BYPASS WITH HIATAL HERNIA REPAIR N/A 01/12/2015   Procedure: LAPAROSCOPIC ROUX-EN-Y GASTRIC BYPASS WITH LYSIS OF ADHESIONS ;  Surgeon: Johnathan Hausen, MD;  Location: [facility];  Service: General;  Laterality: N/A;  . LAPAROSCOPY  02/01/2017   Laparoscopic lysis of adhesions  . LAPAROSCOPY N/A 02/01/2017   Procedure: LAPAROSCOPY Lysis of adhesions;  Surgeon: Clovis Riley, MD;  Location: [place];  Service: General;  Laterality: N/A;  . LAPAROTOMY N/A 02/01/2017   Procedure: excision of umbilical mass, small bowel resection;  Surgeon: Clovis Riley, MD;  Location: [place];  Service: General;  Laterality: N/A;  . SPINE SURGERY     C6-C7 fusion x2  . SPINE SURGERY     herniated disc L4-L5  . TRIGGER FINGER RELEASE Left 03/14/2019   Procedure: RELEASE TRIGGER FINGER/A-1 PULLEY LEFT LONG FINGER;  Surgeon: Leanora Cover, MD;  Location: [place];  Service: Orthopedics;  Laterality: Left;  Bier block  . TUBAL LIGATION      Family History  Problem Relation Age of Onset  . Diabetes Mother   . Heart disease Mother   . Hypertension Mother   . Lung disease Father   . Heart disease Father   . Hypertension Father   . Diabetes Father   . Hypertension Sister   . Hyperlipidemia Brother   . Hypertension  Brother   . Heart attack Maternal Grandmother   . Heart disease Maternal Grandmother   . Hypertension Maternal Grandfather   . Diabetes Maternal Grandfather   . Heart disease Maternal Grandfather     Social History:  reports that she quit smoking about 36 years ago. Her smoking use included cigarettes. She has a 20.00 pack-year smoking history. She has never used smokeless tobacco. She reports that she does not drink alcohol or use drugs.   Review of Systems  Lipid history: Has been treated for cardiovascular risk reduction with Crestor 5 mg daily    Lab Results  Component Value Date   CHOL 114 05/28/2019   HDL 45.40 05/28/2019   LDLCALC 51 05/28/2019   TRIG 88.0 05/28/2019   CHOLHDL 3 05/28/2019           Hypertension: Has been controlled on ramipril 5 mg daily  BP Readings from Last 3 Encounters:  05/30/19 120/70  05/16/19 130/80  03/21/19 132/74    Most recent eye exam was in 12/20  Most recent foot exam: 2/21  Currently known complications of diabetes: None known  OSTEOPENIA: T score is -1.4 at the hip  LABS:  Lab on 05/28/2019  Component Date Value Ref Range Status  . Cholesterol 05/28/2019 114  0 - 200 mg/dL Final   ATP III Classification       Desirable:  < 200 mg/dL               Borderline High:  200 - 239 mg/dL          High:  > = 240 mg/dL  . Triglycerides 05/28/2019 88.0  0.0 - 149.0 mg/dL Final   Normal:  <150 mg/dLBorderline High:  150 - 199 mg/dL  . HDL 05/28/2019 45.40  >39.00 mg/dL Final  . VLDL 05/28/2019 17.6  0.0 - 40.0 mg/dL Final  . LDL Cholesterol 05/28/2019 51  0 - 99 mg/dL Final  . Total CHOL/HDL Ratio 05/28/2019 3   Final  Men          Women1/2 Average Risk     3.4          3.3Average Risk          5.0          4.42X Average Risk          9.6          7.13X Average Risk          15.0          11.0                      . NonHDL 05/28/2019 68.77   Final   NOTE:  Non-HDL goal should be 30 mg/dL higher than patient's LDL  goal (i.e. LDL goal of < 70 mg/dL, would have non-HDL goal of < 100 mg/dL)  . Sodium 05/28/2019 141  135 - 145 mEq/L Final  . Potassium 05/28/2019 3.9  3.5 - 5.1 mEq/L Final  . Chloride 05/28/2019 105  96 - 112 mEq/L Final  . CO2 05/28/2019 29  19 - 32 mEq/L Final  . Glucose, Bld 05/28/2019 162* 70 - 99 mg/dL Final  . BUN 77/41/2878 13  6 - 23 mg/dL Final  . Creatinine, Ser 05/28/2019 0.71  0.40 - 1.20 mg/dL Final  . Total Bilirubin 05/28/2019 0.5  0.2 - 1.2 mg/dL Final  . Alkaline Phosphatase 05/28/2019 74  39 - 117 U/L Final  . AST 05/28/2019 21  0 - 37 U/L Final  . ALT 05/28/2019 16  0 - 35 U/L Final  . Total Protein 05/28/2019 6.9  6.0 - 8.3 g/dL Final  . Albumin 67/67/2094 4.4  3.5 - 5.2 g/dL Final  . GFR 70/96/2836 84.13  >60.00 mL/min Final  . Calcium 05/28/2019 9.6  8.4 - 10.5 mg/dL Final    Physical Examination:  BP 120/70 (BP Location: Left Arm, Patient Position: Sitting, Cuff Size: Normal)   Pulse 84   Ht 5\' 5"  (1.651 m)   Wt 184 lb 3.2 oz (83.6 kg)   SpO2 98%   BMI 30.65 kg/m         ASSESSMENT:  Diabetes type 2  See history of present illness for detailed discussion of current diabetes management, blood sugar patterns and problems identified  Last A1c is much improved at 6.2  She has benefited from Victoza and also losing weight She is also trying to walk for exercise and is able to do well with her diet now  Lipids: Excellent control  PLAN:    . Glucose monitoring: New prescription for test strips will be been sent, she will try to not use expired strips or get a new monitor for the previous contour brand    Medications: She will try to take her Victoza in the morning if it helps her compliance   There are no Patient Instructions on file for this visit.     05/30/2019, 8:39 AM   Note: This office note was prepared with Dragon voice recognition system technology. Any transcriptional errors that result from this process are  unintentional.

## 2019-05-31 ENCOUNTER — Telehealth: Payer: Self-pay | Admitting: Internal Medicine

## 2019-05-31 ENCOUNTER — Other Ambulatory Visit: Payer: Self-pay | Admitting: Endocrinology

## 2019-05-31 DIAGNOSIS — G8929 Other chronic pain: Secondary | ICD-10-CM

## 2019-05-31 NOTE — Telephone Encounter (Signed)
Refill Gabapentin #180 with 5 refills

## 2019-06-02 ENCOUNTER — Encounter: Payer: Self-pay | Admitting: Internal Medicine

## 2019-06-02 NOTE — Patient Instructions (Signed)
Continue diet exercise and weight loss efforts.  Continue current medications.  Follow-up for health maintenance exam and fasting labs here in 6 months.

## 2019-06-05 ENCOUNTER — Other Ambulatory Visit: Payer: Self-pay

## 2019-06-05 MED ORDER — GABAPENTIN 300 MG PO CAPS: ORAL_CAPSULE | ORAL | 5 refills | Status: DC

## 2019-06-05 NOTE — Telephone Encounter (Signed)
Last refill 04/01/19, has CPE scheduled in November.

## 2019-07-20 ENCOUNTER — Telehealth: Payer: Self-pay | Admitting: Internal Medicine

## 2019-07-20 MED ORDER — ALPRAZOLAM 0.5 MG PO TABS: 0.5000 mg | ORAL_TABLET | Freq: Every evening | ORAL | 5 refills | Status: DC | PRN

## 2019-07-20 NOTE — Telephone Encounter (Signed)
Refill Alprazolam 0.5 mg #60 with 5 refills to [facility]. MJB,MD

## 2019-07-22 ENCOUNTER — Telehealth: Payer: Self-pay | Admitting: Internal Medicine

## 2019-07-22 MED ORDER — PROMETHAZINE HCL 25 MG PO TABS: ORAL_TABLET | ORAL | 2 refills | Status: DC

## 2019-07-22 NOTE — Telephone Encounter (Signed)
Received Fax RX request from  Pharmacy [id number] - [place], [place] - [address] AT [facility] [address] [facility] [address] Phone:  [phone]  Fax:  [phone]       Medication - promethazine (PHENERGAN) 25 MG tablet   Last Refill - 06/24/2019  Last OV - 05/16/19  Last CPE - 11/27/18  Next Appointment - 12/05/18

## 2019-07-22 NOTE — Telephone Encounter (Signed)
Refill x 3 months 

## 2019-08-02 ENCOUNTER — Other Ambulatory Visit: Payer: Self-pay | Admitting: Endocrinology

## 2019-08-28 DIAGNOSIS — E119 Type 2 diabetes mellitus without complications: Secondary | ICD-10-CM | POA: Diagnosis not present

## 2019-08-28 DIAGNOSIS — H2513 Age-related nuclear cataract, bilateral: Secondary | ICD-10-CM | POA: Diagnosis not present

## 2019-08-28 DIAGNOSIS — H40033 Anatomical narrow angle, bilateral: Secondary | ICD-10-CM | POA: Diagnosis not present

## 2019-09-15 DIAGNOSIS — T3695XA Adverse effect of unspecified systemic antibiotic, initial encounter: Secondary | ICD-10-CM | POA: Diagnosis not present

## 2019-09-15 DIAGNOSIS — N3001 Acute cystitis with hematuria: Secondary | ICD-10-CM | POA: Diagnosis not present

## 2019-09-15 DIAGNOSIS — B379 Candidiasis, unspecified: Secondary | ICD-10-CM | POA: Diagnosis not present

## 2019-09-18 ENCOUNTER — Ambulatory Visit (INDEPENDENT_AMBULATORY_CARE_PROVIDER_SITE_OTHER): Payer: BC Managed Care – PPO | Admitting: Internal Medicine

## 2019-09-18 ENCOUNTER — Other Ambulatory Visit: Payer: Self-pay

## 2019-09-18 ENCOUNTER — Encounter: Payer: Self-pay | Admitting: Internal Medicine

## 2019-09-18 VITALS — BP 120/88 | HR 77 | Temp 97.9°F | Ht 65.0 in | Wt 184.0 lb

## 2019-09-18 DIAGNOSIS — F411 Generalized anxiety disorder: Secondary | ICD-10-CM | POA: Diagnosis not present

## 2019-09-18 DIAGNOSIS — N390 Urinary tract infection, site not specified: Secondary | ICD-10-CM

## 2019-09-18 DIAGNOSIS — B9620 Unspecified Escherichia coli [E. coli] as the cause of diseases classified elsewhere: Secondary | ICD-10-CM

## 2019-09-18 DIAGNOSIS — F439 Reaction to severe stress, unspecified: Secondary | ICD-10-CM | POA: Diagnosis not present

## 2019-09-18 NOTE — Progress Notes (Signed)
   Subjective:    Patient ID: Melissa Benson, female    DOB: 1960-10-16, 59 y.o.   MRN: [id number]  HPI 59 year old Female here for follow up on recent UTI.  Was seen at [facility] on September 12 complaining of UTI symptoms.  Patient was treated with Macrobid for 5 days pending culture and Diflucan if she should develop yeast infection while on antibiotics.  She received a telephone call on September 16 that culture was growing E. coli and Macrobid was effective.  Patient is feeling better.  Patient is having considerable anxiety and stress at work.  Would like refill of Xanax.  Have prescribed Xanax 0.5 mg up to twice daily as needed.  He has a history of diabetes mellitus and mixed hyperlipidemia.  By Dr. Lucianne Muss.  Review of Systems see above-situational stress at work discussed at length     Objective:   Physical Exam Blood pressure 120/88 pulse 77 temperature 97.9 degrees orally pulse oximetry 98% weight 184 pounds.  BMI 30.62.  No CVA tenderness.  Currently asymptomatic with regard to UTI symptoms.       Assessment & Plan:  Acute UTI-resolved.  Urine dipstick is normal.  Anxiety and situational stress at work.  Prescribed Xanax 0.5 mg up to twice daily as needed.  Annual health maintenance exam and lab work scheduled for November.

## 2019-09-19 ENCOUNTER — Encounter: Payer: BC Managed Care – PPO | Admitting: Internal Medicine

## 2019-09-21 MED ORDER — ALPRAZOLAM 0.5 MG PO TABS: 0.5000 mg | ORAL_TABLET | Freq: Two times a day (BID) | ORAL | 2 refills | Status: DC | PRN

## 2019-09-21 NOTE — Patient Instructions (Signed)
Prescribed Xanax to take up to twice daily as needed for situational stress at work and anxiety.  Reviewed notes from [facility].  Macrobid is sensitive to organism that grew in culture.  Return in November for health maintenance exam.

## 2019-09-27 DIAGNOSIS — H25812 Combined forms of age-related cataract, left eye: Secondary | ICD-10-CM | POA: Diagnosis not present

## 2019-10-01 ENCOUNTER — Other Ambulatory Visit: Payer: Self-pay | Admitting: Endocrinology

## 2019-10-08 ENCOUNTER — Other Ambulatory Visit: Payer: Self-pay

## 2019-10-08 ENCOUNTER — Other Ambulatory Visit (INDEPENDENT_AMBULATORY_CARE_PROVIDER_SITE_OTHER): Payer: BC Managed Care – PPO

## 2019-10-08 DIAGNOSIS — E669 Obesity, unspecified: Secondary | ICD-10-CM

## 2019-10-08 DIAGNOSIS — E1169 Type 2 diabetes mellitus with other specified complication: Secondary | ICD-10-CM | POA: Diagnosis not present

## 2019-10-08 LAB — BASIC METABOLIC PANEL
BUN: 13 mg/dL (ref 6–23)
CO2: 33 mEq/L — ABNORMAL HIGH (ref 19–32)
Calcium: 9.8 mg/dL (ref 8.4–10.5)
Chloride: 102 mEq/L (ref 96–112)
Creatinine, Ser: 0.84 mg/dL (ref 0.40–1.20)
GFR: 75.65 mL/min (ref >60.00)
Glucose, Bld: 117 mg/dL — ABNORMAL HIGH (ref 70–99)
Potassium: 4.1 mEq/L (ref 3.5–5.1)
Sodium: 141 mEq/L (ref 135–145)

## 2019-10-08 LAB — MICROALBUMIN / CREATININE URINE RATIO
Creatinine,U: 34.3 mg/dL
Microalb Creat Ratio: 2 mg/g (ref 0.0–30.0)
Microalb, Ur: <0.7 mg/dL (ref 0.0–1.9)

## 2019-10-08 LAB — HEMOGLOBIN A1C: Hgb A1c MFr Bld: 6.5 % (ref 4.6–6.5)

## 2019-10-09 NOTE — Progress Notes (Signed)
Patient ID: Melissa Benson, female   DOB: 12-09-1960, 59 y.o.   MRN: [id number]           Reason for Appointment: Follow-up for Type 2 Diabetes   History of Present Illness:          Date of diagnosis of type 2 diabetes mellitus: 2009         Previous history: She had been on various oral hypoglycemic drugs in the past and was on relatively large doses of basal insulin. 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 she was able to stop insulin A1c had gone down to 6% in 2018 but was followed only about once a year She was tried on Metformin alone in 2018 for about a year but this was then stopped  Recent history:   A1c is relatively stable at 6.5, previously 6.2    Non-insulin hypoglycemic drugs the patient is taking are: Victoza 1.2 mg daily in the morning   Current management, blood sugar patterns and problems identified:  She has been on Victoza since 2/21, and continues on monotherapy with this  She is checking blood sugars mostly in the morning, now using new test strips  Has lost another 6 pounds  No nausea with Victoza  Blood sugars are not being checked after meals monitoring mostly at breakfast and lunch and morning sugars are variable   She is usually fairly consistent with watching her diet with sugar content, high-fat foods and usually limiting carbohydrates  Recently not eating much breakfast        Side effects from medications have been: Nausea, some diarrhea from Metformin, some nausea from glipizide       Typical meal intake: Breakfast is usually having only almonds.  Will have chicken and vegetables at dinner, snacks will be cheese crackers             Exercise:  Walking up to 30 minutes fairly regularly  Glucose monitoring:  done  times a day         Glucometer:Contour .       Blood Glucose  readings by time of day and averages from meter download:   PRE-MEAL Fasting Lunch Dinner Bedtime Overall  Glucose range:  102-154   122  104   Mean/median:  139    125   POST-MEAL PC Breakfast PC Lunch PC Dinner  Glucose range:   147, 175, 106   Mean/median:       Prior sugars  PRE-MEAL Fasting Lunch Dinner Bedtime Overall  Glucose range:  108-198  95-149   80-157   Mean/median:  144  117   115      Dietician visit, most recent: 2017,  Weight history:  Wt Readings from Last 3 Encounters:  10/10/19 178 lb (80.7 kg)  09/18/19 184 lb (83.5 kg)  05/30/19 184 lb 3.2 oz (83.6 kg)    Glycemic control:   Lab Results  Component Value Date   HGBA1C 6.5 10/08/2019   HGBA1C 6.2 (H) 05/14/2019   HGBA1C 7.2 (H) 01/08/2019   Lab Results  Component Value Date   MICROALBUR <0.7 10/08/2019   LDLCALC 51 05/28/2019   CREATININE 0.84 10/08/2019   Lab Results  Component Value Date   MICRALBCREAT 2.0 10/08/2019    Lab Results  Component Value Date   FRUCTOSAMINE 263 03/19/2019  Lab on 10/08/2019  Component Date Value Ref Range Status  . Microalb, Ur 10/08/2019 <0.7  0.0 - 1.9 mg/dL Final  . Creatinine,U 62/26/3335 34.3  mg/dL Final  . Microalb Creat Ratio 10/08/2019 2.0  0.0 - 30.0 mg/g Final  . Sodium 10/08/2019 141  135 - 145 mEq/L Final  . Potassium 10/08/2019 4.1  3.5 - 5.1 mEq/L Final  . Chloride 10/08/2019 102  96 - 112 mEq/L Final  . CO2 10/08/2019 33* 19 - 32 mEq/L Final  . Glucose, Bld 10/08/2019 117* 70 - 99 mg/dL Final  . BUN 45/62/5638 13  6 - 23 mg/dL Final  . Creatinine, Ser 10/08/2019 0.84  0.40 - 1.20 mg/dL Final  . GFR 93/73/4287 75.65  >60.00 mL/min Final  . Calcium 10/08/2019 9.8  8.4 - 10.5 mg/dL Final  . Hgb G8T MFr Bld 10/08/2019 6.5  4.6 - 6.5 % Final   Glycemic Control Guidelines for People with Diabetes:Non Diabetic:  <6%Goal of Therapy: <7%Additional Action Suggested:  >8%     Allergies as of 10/10/2019      Reactions   Morphine And  Related Nausea And Vomiting   Pt states also caused arm to turn black where medication was administered    Morphine Other (See Comments)   Other Nausea And Vomiting   TYLOX   Pantoprazole Hives, Rash      Medication List       Accurate as of October 10, 2019  8:40 AM. If you have any questions, ask your nurse or doctor.        ALPRAZolam 0.5 MG tablet Commonly known as: Xanax Take 1 tablet (0.5 mg total) by mouth 2 (two) times daily as needed for anxiety.   b complex vitamins capsule Take 1 capsule by mouth daily.   B-D UF III MINI PEN NEEDLES 31G X 5 MM Misc Generic drug: Insulin Pen Needle USE ON VICTOZA PEN EVERY DAY   Biotin 15726 MCG Tbdp Take 1 capsule by mouth daily.   cyclobenzaprine 10 MG tablet Commonly known as: FLEXERIL Take 1 tablet (10 mg total) by mouth at bedtime. What changed:   when to take this  reasons to take this   docusate sodium 100 MG capsule Commonly known as: COLACE Take 200 mg by mouth 2 (two) times daily.   gabapentin 300 MG capsule Commonly known as: NEURONTIN Take 2 capsules 3 times a day   glucose blood test strip Use once daily   MAGNESIUM PO Take 500 mg by mouth daily.   multivitamin Chew chewable tablet Chew 1 tablet by mouth daily.   promethazine 25 MG tablet Commonly known as: PHENERGAN TAKE 1 TABLET(25 MG) BY MOUTH EVERY 8 HOURS AS NEEDED FOR NAUSEA OR VOMITING   ramipril 5 MG capsule Commonly known as: ALTACE TAKE 1 CAPSULE(5 MG) BY MOUTH DAILY   rosuvastatin 5 MG tablet Commonly known as: CRESTOR TAKE 1 TABLET(5 MG) BY MOUTH DAILY   sucralfate 1 g tablet Commonly known as: CARAFATE Take 1 g by mouth 4 (four) times daily.   valACYclovir 500 MG tablet Commonly known as: VALTREX Take 500 mg by mouth 2 (two) times daily as needed (cold sores outbreak).   Victoza 18 MG/3ML Sopn Generic drug: liraglutide ADMINISTER 1.2 MG UNDER THE SKIN DAILY AT THE SAME TIME EVERY DAY       Allergies:  Allergies   Allergen Reactions  . Morphine And Related Nausea And Vomiting    Pt states also caused arm to turn black where  medication was administered   . Morphine Other (See Comments)  . Other Nausea And Vomiting    TYLOX  . Pantoprazole Hives and Rash    Past Medical History:  Diagnosis Date  . Anxiety   . Complication of anesthesia   . Diabetes mellitus   . GERD (gastroesophageal reflux disease)   . Headache    no issues since stop caffeine  . History of hiatal hernia   . History of migraine headaches   . History of urinary tract infection    last one 11/2014   . History of vertigo   . Hyperlipidemia   . Hypertension   . Neck pain, chronic   . OSA (obstructive sleep apnea)    on CPAP  . PONV (postoperative nausea and vomiting)   . Psoriasis   . Rosacea   . Weak urinary stream     Past Surgical History:  Procedure Laterality Date  . 2D ECHOCARDIOGRAM  07/20/2011   EF >55%, normal  . ABDOMINAL HYSTERECTOMY    . APPENDECTOMY    . CARDIOVASCULAR STRESS TEST  07/20/2011   Normal myocardial perfusion study, EKG negative for ischemia, no ECG changes  . CHOLECYSTECTOMY    . colonscopy      polyps removed   . DIAGNOSTIC LAPAROSCOPY    . DILATION AND CURETTAGE OF UTERUS    . LAPAROSCOPIC ROUX-EN-Y GASTRIC BYPASS WITH HIATAL HERNIA REPAIR N/A 01/12/2015   Procedure: LAPAROSCOPIC ROUX-EN-Y GASTRIC BYPASS WITH LYSIS OF ADHESIONS ;  Surgeon: Luretha MurphyMatthew Martin, MD;  Location: [facility];  Service: General;  Laterality: N/A;  . LAPAROSCOPY  02/01/2017   Laparoscopic lysis of adhesions  . LAPAROSCOPY N/A 02/01/2017   Procedure: LAPAROSCOPY Lysis of adhesions;  Surgeon: Berna Bueonnor, Chelsea A, MD;  [facility];  Service: General;  Laterality: N/A;  . LAPAROTOMY N/A 02/01/2017   Procedure: excision of umbilical mass, small bowel resection;  Surgeon: Berna Bueonnor, Chelsea A, MD;  Location: [facility];  Service: General;  Laterality: N/A;  . SPINE SURGERY     C6-C7 fusion x2  . SPINE SURGERY     herniated disc  L4-L5  . TRIGGER FINGER RELEASE Left 03/14/2019   Procedure: RELEASE TRIGGER FINGER/A-1 PULLEY LEFT LONG FINGER;  Surgeon: Betha LoaKuzma, Kevin, MD;  Location: [facility];  Service: Orthopedics;  Laterality: Left;  Bier block  . TUBAL LIGATION      Family History  Problem Relation Age of Onset  . Diabetes Mother   . Heart disease Mother   . Hypertension Mother   . Lung disease Father   . Heart disease Father   . Hypertension Father   . Diabetes Father   . Hypertension Sister   . Hyperlipidemia Brother   . Hypertension Brother   . Heart attack Maternal Grandmother   . Heart disease Maternal Grandmother   . Hypertension Maternal Grandfather   . Diabetes Maternal Grandfather   . Heart disease Maternal Grandfather     Social History:  reports that she quit smoking about 36 years ago. Her smoking use included cigarettes. She has a 20.00 pack-year smoking history. She has never used smokeless tobacco. She reports that she does not drink alcohol and does not use drugs.   Review of Systems  Lipid history: Has been treated for cardiovascular risk reduction with Crestor 5 mg daily    Lab Results  Component Value Date   CHOL 114 05/28/2019   HDL 45.40 05/28/2019   LDLCALC 51 05/28/2019   TRIG 88.0 05/28/2019  CHOLHDL 3 05/28/2019           Hypertension: Has been controlled on ramipril 5 mg daily  Home 120/80  BP Readings from Last 3 Encounters:  10/10/19 130/82  09/18/19 120/88  05/30/19 120/70    Most recent eye exam was in 12/20  Most recent foot exam: 2/21  Currently known complications of diabetes: None known  OSTEOPENIA: T score is -1.4 at the hip  LABS:  Lab on 10/08/2019  Component Date Value Ref Range Status  . Microalb, Ur 10/08/2019 <0.7  0.0 - 1.9 mg/dL Final  . Creatinine,U 84/16/6063 34.3  mg/dL Final  . Microalb Creat Ratio 10/08/2019 2.0  0.0 - 30.0 mg/g Final  . Sodium 10/08/2019 141  135 - 145 mEq/L Final  . Potassium 10/08/2019 4.1   3.5 - 5.1 mEq/L Final  . Chloride 10/08/2019 102  96 - 112 mEq/L Final  . CO2 10/08/2019 33* 19 - 32 mEq/L Final  . Glucose, Bld 10/08/2019 117* 70 - 99 mg/dL Final  . BUN 01/60/1093 13  6 - 23 mg/dL Final  . Creatinine, Ser 10/08/2019 0.84  0.40 - 1.20 mg/dL Final  . GFR 23/55/7322 75.65  >60.00 mL/min Final  . Calcium 10/08/2019 9.8  8.4 - 10.5 mg/dL Final  . Hgb G2R MFr Bld 10/08/2019 6.5  4.6 - 6.5 % Final   Glycemic Control Guidelines for People with Diabetes:Non Diabetic:  <6%Goal of Therapy: <7%Additional Action Suggested:  >8%     Physical Examination:  BP 130/82   Pulse 98   Ht 5' 4.5" (1.638 m)   Wt 178 lb (80.7 kg)   SpO2 99%   BMI 30.08 kg/m     No ankle edema    ASSESSMENT:  Diabetes type 2  See history of present illness for detailed discussion of current diabetes management, blood sugar patterns and problems identified   A1c is much improved at 6.2  She has benefited from continuing 1.2 mg Victoza and also losing weight She has taken this more regularly recently with the timing it in the morning Discussed that she can keep this at room temperature when she starts using it for better compliance  She does do well with diet and exercise usually and weight is down slightly  Hypertension: Diastolic is borderline high, she will call her PCP if diastolic continues to stay over 80 consistently  PLAN:     Discussed timing of glucose monitoring including rotation after meals, do not need to check in at lunchtime  Consistent diet and exercise  To call if fasting readings are consistently high  Influenza vaccine given  Follow-up in 6 months unless blood sugars are getting higher  May increase Victoza only if she is tending to have higher A1c or weight gain   There are no Patient Instructions on file for this visit.     Reather Littler 10/10/2019, 8:40 AM   Note: This office note was prepared with Dragon voice recognition system technology. Any  transcriptional errors that result from this process are unintentional.

## 2019-10-10 ENCOUNTER — Other Ambulatory Visit: Payer: Self-pay

## 2019-10-10 ENCOUNTER — Encounter: Payer: Self-pay | Admitting: Endocrinology

## 2019-10-10 ENCOUNTER — Ambulatory Visit (INDEPENDENT_AMBULATORY_CARE_PROVIDER_SITE_OTHER): Payer: BC Managed Care – PPO | Admitting: Endocrinology

## 2019-10-10 VITALS — BP 130/82 | HR 98 | Ht 64.5 in | Wt 178.0 lb

## 2019-10-10 DIAGNOSIS — Z23 Encounter for immunization: Secondary | ICD-10-CM | POA: Diagnosis not present

## 2019-10-10 DIAGNOSIS — I10 Essential (primary) hypertension: Secondary | ICD-10-CM | POA: Diagnosis not present

## 2019-10-10 DIAGNOSIS — E1165 Type 2 diabetes mellitus with hyperglycemia: Secondary | ICD-10-CM

## 2019-10-16 DIAGNOSIS — H2511 Age-related nuclear cataract, right eye: Secondary | ICD-10-CM | POA: Diagnosis not present

## 2019-10-18 DIAGNOSIS — H25811 Combined forms of age-related cataract, right eye: Secondary | ICD-10-CM | POA: Diagnosis not present

## 2019-10-18 LAB — HM DIABETES EYE EXAM

## 2019-10-24 ENCOUNTER — Telehealth: Payer: Self-pay | Admitting: Internal Medicine

## 2019-10-24 ENCOUNTER — Encounter: Payer: Self-pay | Admitting: Internal Medicine

## 2019-10-24 ENCOUNTER — Ambulatory Visit (INDEPENDENT_AMBULATORY_CARE_PROVIDER_SITE_OTHER): Payer: BC Managed Care – PPO | Admitting: Internal Medicine

## 2019-10-24 ENCOUNTER — Other Ambulatory Visit: Payer: Self-pay

## 2019-10-24 VITALS — BP 130/60 | HR 92 | Temp 98.2°F | Ht 64.0 in | Wt 182.0 lb

## 2019-10-24 DIAGNOSIS — R8290 Unspecified abnormal findings in urine: Secondary | ICD-10-CM | POA: Diagnosis not present

## 2019-10-24 DIAGNOSIS — N390 Urinary tract infection, site not specified: Secondary | ICD-10-CM

## 2019-10-24 DIAGNOSIS — N909 Noninflammatory disorder of vulva and perineum, unspecified: Secondary | ICD-10-CM

## 2019-10-24 DIAGNOSIS — R300 Dysuria: Secondary | ICD-10-CM | POA: Diagnosis not present

## 2019-10-24 LAB — POCT URINALYSIS DIPSTICK
Bilirubin, UA: NEGATIVE
Glucose, UA: NEGATIVE
Ketones, UA: NEGATIVE
Nitrite, UA: NEGATIVE
Protein, UA: NEGATIVE
Spec Grav, UA: 1.01 (ref 1.010–1.025)
Urobilinogen, UA: 0.2 E.U./dL
pH, UA: 6.5 (ref 5.0–8.0)

## 2019-10-24 MED ORDER — CIPROFLOXACIN HCL 500 MG PO TABS: 500.0000 mg | ORAL_TABLET | Freq: Two times a day (BID) | ORAL | 0 refills | Status: DC

## 2019-10-24 MED ORDER — TERCONAZOLE 0.4 % VA CREA: TOPICAL_CREAM | VAGINAL | 0 refills | Status: DC

## 2019-10-24 NOTE — Patient Instructions (Addendum)
Urine culture obtained. Start Levaquin 500 mg daily for 10 days. Use Terazol 7 vaginal cream on external genitalia up to twice daily as needed. Will likely need repeat urine check in apprx 2 weeks pending urine culture results.

## 2019-10-24 NOTE — Progress Notes (Signed)
   Subjective:    Patient ID: Melissa Benson, female    DOB: December 27, 1960, 59 y.o.   MRN: [id number]  HPI  59 year old Female in today with urinary tract infection symptoms.  Has significant dysuria feels that she has a vaginal discharge.  Dipstick urine specimen today shows moderate LE and small amount of occult blood.  Urine is cloudy.  Had symptoms onset last evening.  No fever chills nausea or vomiting.  She has a history of diabetes seen by Dr. Lucianne Muss.  In September she had an E. coli UTI treated at [facility] with Macrobid for 5 days.  Had UTI in September 2020.  She has had 2 COVID-19 immunizations in March and April of this year.  Had flu vaccine October 7.  Review of Systems some situational stress with family.  Work remains stressful.     Objective:   Physical Exam  Blood pressure 130/60 pulse 92 regular temperature 98.2 degrees pulse oximetry 97% weight 182 pounds BMI 31.24 height 5 feet 4 inches  Skin warm and dry.  No CVA tenderness.  I do not see any significant vaginal discharge on inspection.      Assessment & Plan:  Acute urinary tract infection.  Most recently had E. coli UTI treated at [facility] with Macrobid for 5 days in September.  Vaginal discomfort-recommend Terazol 7 vaginal cream on external genitalia up to twice daily as needed.  Plan: Urine culture and microscopic specimens have been sent.  I have prescribed Cipro 500 mg twice daily for 10 days.  She will need follow-up urine specimen in a couple of weeks pending culture results.

## 2019-10-24 NOTE — Telephone Encounter (Signed)
Lottie Sigman [phone]  Liz[place]ett called to say it hurts really bad to urinate, she also has discharge when she wipes, frequent urination,she did take some AZO last night,

## 2019-10-24 NOTE — Telephone Encounter (Signed)
Scheduled

## 2019-10-24 NOTE — Telephone Encounter (Signed)
She needs OV

## 2019-10-26 ENCOUNTER — Telehealth: Payer: Self-pay | Admitting: Internal Medicine

## 2019-10-26 ENCOUNTER — Encounter: Payer: Self-pay | Admitting: Internal Medicine

## 2019-10-26 DIAGNOSIS — A499 Bacterial infection, unspecified: Secondary | ICD-10-CM

## 2019-10-26 LAB — URINE CULTURE
MICRO NUMBER:: 11101392
SPECIMEN QUALITY:: ADEQUATE

## 2019-10-26 LAB — URINALYSIS, MICROSCOPIC ONLY
Bacteria, UA: NONE SEEN /HPF
Hyaline Cast: NONE SEEN /LPF
RBC / HPF: NONE SEEN /HPF (ref 0–2)
Squamous Epithelial / HPF: NONE SEEN /HPF (ref <=5)

## 2019-10-26 MED ORDER — AMOXICILLIN-POT CLAVULANATE 875-125 MG PO TABS: ORAL_TABLET | ORAL | 0 refills | Status: DC

## 2019-10-26 NOTE — Telephone Encounter (Signed)
I called patient with results of urine culture. Has ESBL E.coli UTI and we need to change her antibiotics to Augmentin which is sensitive to this organism. Will treat for 2 weeks and follow up here at that time. She says she is slightly better but still has dysuria. Augmentin should be a better choice given ESBL organism. Will call in Augmentin 875 mg twice daily x 14 days.  MJB,MD

## 2019-10-31 ENCOUNTER — Other Ambulatory Visit: Payer: Self-pay | Admitting: Endocrinology

## 2019-10-31 ENCOUNTER — Other Ambulatory Visit: Payer: Self-pay | Admitting: Internal Medicine

## 2019-11-06 ENCOUNTER — Other Ambulatory Visit: Payer: Self-pay | Admitting: Internal Medicine

## 2019-11-06 NOTE — Telephone Encounter (Signed)
Please call her. We cannot refill this without OV. Is she having symptoms? I need to leave at 12:30 pm today and just got this refill message.

## 2019-11-10 ENCOUNTER — Other Ambulatory Visit: Payer: Self-pay | Admitting: Internal Medicine

## 2019-11-11 ENCOUNTER — Other Ambulatory Visit: Payer: Self-pay

## 2019-11-11 ENCOUNTER — Encounter: Payer: Self-pay | Admitting: Internal Medicine

## 2019-11-11 ENCOUNTER — Ambulatory Visit (INDEPENDENT_AMBULATORY_CARE_PROVIDER_SITE_OTHER): Payer: BC Managed Care – PPO | Admitting: Internal Medicine

## 2019-11-11 VITALS — BP 130/80 | HR 88 | Temp 98.1°F | Ht 64.0 in | Wt 182.0 lb

## 2019-11-11 DIAGNOSIS — Z8619 Personal history of other infectious and parasitic diseases: Secondary | ICD-10-CM

## 2019-11-11 DIAGNOSIS — N390 Urinary tract infection, site not specified: Secondary | ICD-10-CM | POA: Diagnosis not present

## 2019-11-11 LAB — POCT URINALYSIS DIPSTICK
Appearance: NEGATIVE
Bilirubin, UA: NEGATIVE
Blood, UA: NEGATIVE
Glucose, UA: NEGATIVE
Ketones, UA: NEGATIVE
Leukocytes, UA: NEGATIVE
Nitrite, UA: NEGATIVE
Odor: NEGATIVE
Protein, UA: NEGATIVE
Spec Grav, UA: 1.015 (ref 1.010–1.025)
Urobilinogen, UA: 0.2 E.U./dL
pH, UA: 6.5 (ref 5.0–8.0)

## 2019-11-11 NOTE — Progress Notes (Signed)
   Subjective:    Patient ID: Melissa Benson, female    DOB: [birth year], 59 y.o.   MRN: [id number]  HPI 59 year old Female for follow up on ESBL E.coli UTI. Dipstirck U/A is completely normal today.  She is asymptomatic.  She was treated on October 21 with a 10-day course of Macrodantin.  Organism was sensitive to Macrobid.  Hopefully she will not have recurrence of this organism.  No prior history of ESBL E. coli urinary infection.    Review of Systems see above-no symptoms of dysuria, frequency     Objective:   Physical Exam Blood pressure 130/80 pulse 88 temperature 98.1 degrees pulse oximetry 98% weight 118 pounds BMI 31.27  She is is seen in the office in no acute distress.  No CVA tenderness.  Urine dipstick is completely normal today without evidence of white cells, nitrite, occult blood or protein.       Assessment & Plan:  Status post treatment for ESBL E. coli urinary tract infection.  Plan: Symptoms have resolved having been treated with Macrobid for 10 days.  Return as needed.

## 2019-11-11 NOTE — Patient Instructions (Addendum)
It was a pleasure to see you today.  Urine dipstick is completely normal status post treatment with Macrobid for the ESBL E. coli urinary infection.   Health maintenance exam appointment is December 2

## 2019-11-12 ENCOUNTER — Other Ambulatory Visit: Payer: Self-pay | Admitting: Internal Medicine

## 2019-11-26 ENCOUNTER — Other Ambulatory Visit: Payer: Self-pay

## 2019-11-26 ENCOUNTER — Telehealth: Payer: Self-pay

## 2019-11-26 ENCOUNTER — Ambulatory Visit: Payer: BC Managed Care – PPO | Admitting: Internal Medicine

## 2019-11-26 ENCOUNTER — Encounter: Payer: Self-pay | Admitting: Internal Medicine

## 2019-11-26 VITALS — BP 130/80 | HR 100 | Temp 98.0°F | Ht 64.0 in | Wt 182.0 lb

## 2019-11-26 DIAGNOSIS — R300 Dysuria: Secondary | ICD-10-CM

## 2019-11-26 DIAGNOSIS — R350 Frequency of micturition: Secondary | ICD-10-CM

## 2019-11-26 DIAGNOSIS — R8290 Unspecified abnormal findings in urine: Secondary | ICD-10-CM

## 2019-11-26 LAB — POCT URINALYSIS DIPSTICK
Bilirubin, UA: NEGATIVE
Glucose, UA: NEGATIVE
Ketones, UA: NEGATIVE
Nitrite, UA: NEGATIVE
Protein, UA: NEGATIVE
Spec Grav, UA: 1.01 (ref 1.010–1.025)
Urobilinogen, UA: 0.2 E.U./dL
pH, UA: 6.5 (ref 5.0–8.0)

## 2019-11-26 LAB — CBC WITH DIFFERENTIAL/PLATELET
Absolute Monocytes: 422 cells/uL (ref 200–950)
Basophils Absolute: 33 cells/uL (ref 0–200)
Basophils Relative: 0.5 %
Eosinophils Absolute: 132 cells/uL (ref 15–500)
Eosinophils Relative: 2 %
HCT: 37.4 % (ref 35.0–45.0)
Hemoglobin: 12.3 g/dL (ref 11.7–15.5)
Lymphs Abs: 1406 cells/uL (ref 850–3900)
MCH: 30.1 pg (ref 27.0–33.0)
MCHC: 32.9 g/dL (ref 32.0–36.0)
MCV: 91.4 fL (ref 80.0–100.0)
MPV: 9.7 fL (ref 7.5–12.5)
Monocytes Relative: 6.4 %
Neutro Abs: 4607 cells/uL (ref 1500–7800)
Neutrophils Relative %: 69.8 %
Platelets: 278 10*3/uL (ref 140–400)
RBC: 4.09 10*6/uL (ref 3.80–5.10)
RDW: 12.4 % (ref 11.0–15.0)
Total Lymphocyte: 21.3 %
WBC: 6.6 10*3/uL (ref 3.8–10.8)

## 2019-11-26 MED ORDER — NITROFURANTOIN MONOHYD MACRO 100 MG PO CAPS: 100.0000 mg | ORAL_CAPSULE | Freq: Two times a day (BID) | ORAL | 0 refills | Status: DC

## 2019-11-26 NOTE — Patient Instructions (Addendum)
Start Macrobid 100 mg twice daily for 10 days. Follow-up December 2 at time of physical exam. Culture obtained and pending. Patient will be notified of results and and further advisement. I feel that episode of diarrhea triggered this UTI.

## 2019-11-26 NOTE — Progress Notes (Signed)
   Subjective:    Patient ID: Melissa Benson, female    DOB: 03-11-1960, 59 y.o.   MRN: [id number]  HPI 59 year old here for recurrent dysuria.  Patient was treated with Macrobid on October for ESBL E. coli.  She returned here on November 8 and urine dipstick was completely normal.  Dipstick urine today shows 2+ LE and is complaining of dysuria. Not seen blood in urine. Hx of constant nausea even when not ill. No fever or shaking chills today.  Urine microscopic is greater than 60 white blood cells per high-powered field and no bacteria, casts or crystals.  White blood cell count is normal at 6600.  On October 21st had UTI started on Cipro initially but culture grew ESBL E, coli and was resistant to Cipro and was switched to [employer].  Review of Systems This past Saturday had diarrhea and was cold. Did not check temp. Diarrhea going around in family. 12 month old baby had viral meningitis recently. Son and 3 grandsons with diarrhea.     Objective:   Physical Exam Blood pressure 130/80 pulse 100 temperature 98 degrees pulse oximetry 97% weight 182 pounds height 5 feet 4 inches BMI 31.24 No CVA tenderness.      Assessment & Plan:  Recurrent urinary tract infection.  Prescribe  Macrobid 100 mg twice daily for 10 days.  Follow-up at time of health maintenance exam on December 2.  Likely is going to need to see urologist regarding these recurrent infections.  It is likely that diarrhea triggered this episode of UTI  Urine culture November 26, 2019 had no growth.  Follow-up in December.

## 2019-11-26 NOTE — Telephone Encounter (Signed)
OK 

## 2019-11-26 NOTE — Telephone Encounter (Signed)
Patient has burning when urinating, dysuria, and frequent urination. Temperature is 97.7 this started on Saturday she wants to be seen today.

## 2019-11-27 LAB — URINE CULTURE
MICRO NUMBER:: 11238613
Result:: NO GROWTH
SPECIMEN QUALITY:: ADEQUATE

## 2019-11-27 LAB — URINALYSIS, MICROSCOPIC ONLY
Bacteria, UA: NONE SEEN /HPF
Hyaline Cast: NONE SEEN /LPF
RBC / HPF: NONE SEEN /HPF (ref 0–2)
Squamous Epithelial / HPF: NONE SEEN /HPF (ref <=5)
WBC, UA: >=60 /HPF — AB (ref 0–5)

## 2019-11-30 ENCOUNTER — Other Ambulatory Visit: Payer: Self-pay | Admitting: Internal Medicine

## 2019-12-03 ENCOUNTER — Other Ambulatory Visit: Payer: BC Managed Care – PPO | Admitting: Internal Medicine

## 2019-12-03 ENCOUNTER — Other Ambulatory Visit: Payer: Self-pay

## 2019-12-03 DIAGNOSIS — I10 Essential (primary) hypertension: Secondary | ICD-10-CM | POA: Diagnosis not present

## 2019-12-03 DIAGNOSIS — E1169 Type 2 diabetes mellitus with other specified complication: Secondary | ICD-10-CM

## 2019-12-03 DIAGNOSIS — E119 Type 2 diabetes mellitus without complications: Secondary | ICD-10-CM

## 2019-12-03 DIAGNOSIS — E8881 Metabolic syndrome: Secondary | ICD-10-CM

## 2019-12-03 DIAGNOSIS — S22000S Wedge compression fracture of unspecified thoracic vertebra, sequela: Secondary | ICD-10-CM

## 2019-12-03 DIAGNOSIS — Z0000 Encounter for general adult medical examination without abnormal findings: Secondary | ICD-10-CM

## 2019-12-03 DIAGNOSIS — Z1329 Encounter for screening for other suspected endocrine disorder: Secondary | ICD-10-CM

## 2019-12-03 DIAGNOSIS — E785 Hyperlipidemia, unspecified: Secondary | ICD-10-CM | POA: Diagnosis not present

## 2019-12-04 LAB — COMPLETE METABOLIC PANEL WITH GFR
AG Ratio: 1.8 (calc) (ref 1.0–2.5)
ALT: 22 U/L (ref 6–29)
AST: 26 U/L (ref 10–35)
Albumin: 4.2 g/dL (ref 3.6–5.1)
Alkaline phosphatase (APISO): 71 U/L (ref 37–153)
BUN: 15 mg/dL (ref 7–25)
CO2: 28 mmol/L (ref 20–32)
Calcium: 9.6 mg/dL (ref 8.6–10.4)
Chloride: 103 mmol/L (ref 98–110)
Creat: 0.8 mg/dL (ref 0.50–1.05)
GFR, Est African American: 94 mL/min/{1.73_m2} (ref >=60)
GFR, Est Non African American: 81 mL/min/{1.73_m2} (ref >=60)
Globulin: 2.4 g/dL (calc) (ref 1.9–3.7)
Glucose, Bld: 129 mg/dL — ABNORMAL HIGH (ref 65–99)
Potassium: 4.8 mmol/L (ref 3.5–5.3)
Sodium: 140 mmol/L (ref 135–146)
Total Bilirubin: 0.5 mg/dL (ref 0.2–1.2)
Total Protein: 6.6 g/dL (ref 6.1–8.1)

## 2019-12-04 LAB — CBC WITH DIFFERENTIAL/PLATELET
Absolute Monocytes: 441 cells/uL (ref 200–950)
Basophils Absolute: 17 cells/uL (ref 0–200)
Basophils Relative: 0.3 %
Eosinophils Absolute: 93 cells/uL (ref 15–500)
Eosinophils Relative: 1.6 %
HCT: 35.7 % (ref 35.0–45.0)
Hemoglobin: 12.1 g/dL (ref 11.7–15.5)
Lymphs Abs: 1520 cells/uL (ref 850–3900)
MCH: 31.2 pg (ref 27.0–33.0)
MCHC: 33.9 g/dL (ref 32.0–36.0)
MCV: 92 fL (ref 80.0–100.0)
MPV: 9.5 fL (ref 7.5–12.5)
Monocytes Relative: 7.6 %
Neutro Abs: 3729 cells/uL (ref 1500–7800)
Neutrophils Relative %: 64.3 %
Platelets: 279 10*3/uL (ref 140–400)
RBC: 3.88 10*6/uL (ref 3.80–5.10)
RDW: 12.3 % (ref 11.0–15.0)
Total Lymphocyte: 26.2 %
WBC: 5.8 10*3/uL (ref 3.8–10.8)

## 2019-12-04 LAB — LIPID PANEL
Cholesterol: 118 mg/dL (ref <200)
HDL: 49 mg/dL — ABNORMAL LOW (ref >=50)
LDL Cholesterol (Calc): 49 mg/dL (calc)
Non-HDL Cholesterol (Calc): 69 mg/dL (calc) (ref <130)
Total CHOL/HDL Ratio: 2.4 (calc) (ref <5.0)
Triglycerides: 110 mg/dL (ref <150)

## 2019-12-04 LAB — HEMOGLOBIN A1C
Hgb A1c MFr Bld: 6.1 % of total Hgb — ABNORMAL HIGH (ref <5.7)
Mean Plasma Glucose: 128 (calc)
eAG (mmol/L): 7.1 (calc)

## 2019-12-04 LAB — MICROALBUMIN / CREATININE URINE RATIO
Creatinine, Urine: 121 mg/dL (ref 20–275)
Microalb Creat Ratio: 2 mcg/mg creat (ref <30)
Microalb, Ur: 0.2 mg/dL

## 2019-12-04 LAB — TSH: TSH: 1.19 mIU/L (ref 0.40–4.50)

## 2019-12-05 ENCOUNTER — Encounter: Payer: Self-pay | Admitting: Internal Medicine

## 2019-12-05 ENCOUNTER — Ambulatory Visit (INDEPENDENT_AMBULATORY_CARE_PROVIDER_SITE_OTHER): Payer: BC Managed Care – PPO | Admitting: Internal Medicine

## 2019-12-05 ENCOUNTER — Other Ambulatory Visit: Payer: Self-pay

## 2019-12-05 VITALS — BP 120/80 | HR 89 | Ht 64.0 in | Wt 182.0 lb

## 2019-12-05 DIAGNOSIS — F439 Reaction to severe stress, unspecified: Secondary | ICD-10-CM

## 2019-12-05 DIAGNOSIS — E785 Hyperlipidemia, unspecified: Secondary | ICD-10-CM

## 2019-12-05 DIAGNOSIS — R350 Frequency of micturition: Secondary | ICD-10-CM | POA: Diagnosis not present

## 2019-12-05 DIAGNOSIS — Z8619 Personal history of other infectious and parasitic diseases: Secondary | ICD-10-CM | POA: Diagnosis not present

## 2019-12-05 DIAGNOSIS — Z0000 Encounter for general adult medical examination without abnormal findings: Secondary | ICD-10-CM

## 2019-12-05 DIAGNOSIS — M542 Cervicalgia: Secondary | ICD-10-CM

## 2019-12-05 DIAGNOSIS — Z87440 Personal history of urinary (tract) infections: Secondary | ICD-10-CM

## 2019-12-05 DIAGNOSIS — I10 Essential (primary) hypertension: Secondary | ICD-10-CM

## 2019-12-05 DIAGNOSIS — S22000S Wedge compression fracture of unspecified thoracic vertebra, sequela: Secondary | ICD-10-CM

## 2019-12-05 DIAGNOSIS — F411 Generalized anxiety disorder: Secondary | ICD-10-CM

## 2019-12-05 DIAGNOSIS — E8881 Metabolic syndrome: Secondary | ICD-10-CM | POA: Diagnosis not present

## 2019-12-05 DIAGNOSIS — Z9884 Bariatric surgery status: Secondary | ICD-10-CM

## 2019-12-05 DIAGNOSIS — G8929 Other chronic pain: Secondary | ICD-10-CM

## 2019-12-05 DIAGNOSIS — Z6831 Body mass index (BMI) 31.0-31.9, adult: Secondary | ICD-10-CM

## 2019-12-05 DIAGNOSIS — R519 Headache, unspecified: Secondary | ICD-10-CM

## 2019-12-05 DIAGNOSIS — E1169 Type 2 diabetes mellitus with other specified complication: Secondary | ICD-10-CM

## 2019-12-05 DIAGNOSIS — Z2239 Carrier of other specified bacterial diseases: Secondary | ICD-10-CM

## 2019-12-05 DIAGNOSIS — Z8659 Personal history of other mental and behavioral disorders: Secondary | ICD-10-CM

## 2019-12-05 LAB — POCT URINALYSIS DIPSTICK
Appearance: NEGATIVE
Bilirubin, UA: NEGATIVE
Blood, UA: NEGATIVE
Glucose, UA: NEGATIVE
Ketones, UA: NEGATIVE
Leukocytes, UA: NEGATIVE
Nitrite, UA: NEGATIVE
Odor: NEGATIVE
Protein, UA: NEGATIVE
Spec Grav, UA: 1.01 (ref 1.010–1.025)
Urobilinogen, UA: 0.2 E.U./dL
pH, UA: 6.5 (ref 5.0–8.0)

## 2019-12-05 MED ORDER — OFLOXACIN 0.3 % OP SOLN: OPHTHALMIC | 0 refills | Status: DC

## 2019-12-05 NOTE — Progress Notes (Addendum)
Subjective:    Patient ID: Melissa Benson, female    DOB: 01/03/1961, 59 y.o.   MRN: [id number]  HPI  59 year old Female seen for health maintenance exam and evaluation of medical issues.  Recent history of urinary issues.  Had a ESBL E. coli UTI in October..  In late November had 2+ LE on urine dipstick but culture revealed no growth.  Dipstick UA today is normal.  In January 2019 she was admitted for lysis of adhesions, excision of umbilical mass and small bowel resection.  Surgery was done by Dr. Doylene Canard, general surgeon.  Presented to the hospital with small bowel obstruction.  In November 2019 she suffered a T10 fracture with syncopal episode.  This occurred when she was taking a prep for colonoscopy and felt acutely nauseated.  Due to this episode, colonoscopy was postponed in January 01, 2018.  Study showed diverticulosis and 5-year follow-up recommended.  Also had upper endoscopy same day showing gastric bypass surgery with a small pouch and no lesions of concern.  History of diabetes mellitus, hyperlipidemia, sleep apnea, hypertension, migraine headaches, paresthesias in feet secondary to lumbar disc disease.  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  History of recurrent urinary infections but more frequent recently.  History of chronic neck pain.  History of depression.  Sees Dr. Lucianne Muss for diabetic management.  Reports having had headaches starting in her late 30s.  Had issues with chronic daily headache for some 20 years.  Tried Topamax.  CT of the hand in 2017 was normal.  She stopped Topamax because she thought it made her feel jittery.  History of elevated liver enzymes.  We advised her to stop taking Tylenol and NSAIDs.  Liver functions normalized.  History of mid back pain between the shoulder blades treated with Flexeril.  EMG in 2014 showed mild L5 radiculopathy.  In October 2017 she had EMG of right upper and lower extremities.  These findings were  consistent with a very mild old intraspinal canal lesion consistent with radiculopathy affecting the right L5 segment.  She has been a patient here since 2006.  At that time she weighed 251 pounds.  She weighed 260 pounds in 2009.  Hemoglobin A1c was 9.1% in 2009.  In 2010 she was started on insulin.  In 2014 she lost down to 223 pounds.  In 2017 after gastric bypass surgery she weighed 173 pounds.  In January 2017 when she was found to have T wave inversions on EKG with family history of coronary artery disease in her mother.  She was sent to cardiologist.  She had undergone apparently a nuclear stress test in 2013.  She had a normal nuclear medicine study February 2017.  Patient's mother passed away under hospice care in 2020.  Patient works full-time at a bank.  Used to see Dr. Nolen Mu for depression.  Dr. Nolen Mu is no longer in private practice and patient does not want any therapy.  Sees Dr. Dareen Piano for Gynecology care.  History of essential hypertension-stable on current regimen.  Social history: Married.  Works as a Haematologist.  Has 2 sons.  Does not smoke.  Family history: History of diabetes mellitus in father.  Mother deceased with history of heart disease and Alzheimer's disease.  3 brothers, all of them have had substance abuse issues.  2 sisters, 1 of whom is a diabetic and 1 who had gastric sleeve surgery with history of breast cancer.  There is 1 brother with history of stroke  perhaps related to substance abuse according       Review of Systems see above-has some situational stress with family    Objective:   Physical Exam  Blood pressure 120/80, pulse 89, pulse oximetry 98% weight 182 pounds height 5 feet 4 inches BMI 31.24.  Skin warm and dry.  Nodes none.  TMs are clear.  Neck is supple without JVD thyromegaly or carotid bruits.  Her chest is clear to auscultation without rales or wheezing.  Breast: Without masses.  Cardiac exam: Regular rate and rhythm, normal  S1 and S2 without murmurs or gallops.  Abdomen: Obese soft nondistended without hepatosplenomegaly, masses, or significant tenderness.  No lower extremity pitting edema.  No gross focal deficits on brief neurological exam.      Assessment & Plan:  History of ESBL UTI-resolved with treatment  Chronic thoracic and neck pain.  Is on gabapentin and Flexeril.  History of T2 fracture due to a fall and syncope.  History of constipation treated with Linzess  Hyperlipidemia treated with Crestor 5 mg daily is within normal limits.  Low HDL of 49  Anxiety and insomnia-have prescribed Xanax at bedtime  History of gastric bypass surgery  History of paresthesias in feet felt to be secondary to lumbar disc disease.  Does not have B12 deficiency.  History of sleep apnea.  Type 2 diabetes mellitus-hemoglobin A1c 6.1%.  Is on Victoza per Dr. Lucianne Muss.  History of migraine headaches  History of frequent bouts of nausea treated with Phenergan.  Could be related to Victoza.  BMI 31.24-continue to work on diet exercise and weight loss.  History of trigger finger left long finger status post release by Dr. Merlyn Lot March 2021.  Health maintenance-had colonoscopy 2019 and upper endoscopy 2019-5-year follow-up recommended on colonoscopy  Plan: Continue current medications to follow-up in June 2022.  Continue diet and exercise efforts.

## 2019-12-05 NOTE — Patient Instructions (Addendum)
Continue same meds. UTI has resolved. Referring you to [facility] for evaluation of recurrent UTIs. Please try to get some rest and set some limits with babysitting.

## 2019-12-15 ENCOUNTER — Emergency Department (HOSPITAL_COMMUNITY)
Admission: EM | Admit: 2019-12-15 | Discharge: 2019-12-16 | Disposition: A | Discharge status: Home / Self Care | Payer: BC Managed Care – PPO | Attending: Emergency Medicine | Admitting: Emergency Medicine

## 2019-12-15 ENCOUNTER — Other Ambulatory Visit: Payer: Self-pay

## 2019-12-15 ENCOUNTER — Encounter (HOSPITAL_COMMUNITY): Payer: Self-pay | Admitting: *Deleted

## 2019-12-15 ENCOUNTER — Emergency Department (HOSPITAL_COMMUNITY): Payer: BC Managed Care – PPO

## 2019-12-15 DIAGNOSIS — I10 Essential (primary) hypertension: Secondary | ICD-10-CM | POA: Diagnosis not present

## 2019-12-15 DIAGNOSIS — E119 Type 2 diabetes mellitus without complications: Secondary | ICD-10-CM | POA: Diagnosis not present

## 2019-12-15 DIAGNOSIS — J40 Bronchitis, not specified as acute or chronic: Secondary | ICD-10-CM | POA: Diagnosis not present

## 2019-12-15 DIAGNOSIS — R531 Weakness: Secondary | ICD-10-CM | POA: Insufficient documentation

## 2019-12-15 DIAGNOSIS — R0602 Shortness of breath: Secondary | ICD-10-CM | POA: Diagnosis not present

## 2019-12-15 DIAGNOSIS — J019 Acute sinusitis, unspecified: Secondary | ICD-10-CM | POA: Diagnosis not present

## 2019-12-15 DIAGNOSIS — J209 Acute bronchitis, unspecified: Secondary | ICD-10-CM | POA: Insufficient documentation

## 2019-12-15 DIAGNOSIS — Z87891 Personal history of nicotine dependence: Secondary | ICD-10-CM | POA: Diagnosis not present

## 2019-12-15 DIAGNOSIS — Z20822 Contact with and (suspected) exposure to covid-19: Secondary | ICD-10-CM | POA: Diagnosis not present

## 2019-12-15 DIAGNOSIS — J90 Pleural effusion, not elsewhere classified: Secondary | ICD-10-CM | POA: Diagnosis not present

## 2019-12-15 DIAGNOSIS — R42 Dizziness and giddiness: Secondary | ICD-10-CM | POA: Diagnosis not present

## 2019-12-15 DIAGNOSIS — Z79899 Other long term (current) drug therapy: Secondary | ICD-10-CM | POA: Diagnosis not present

## 2019-12-15 DIAGNOSIS — R059 Cough, unspecified: Secondary | ICD-10-CM | POA: Diagnosis not present

## 2019-12-15 LAB — CBC
HCT: 36.5 % (ref 36.0–46.0)
Hemoglobin: 11.7 g/dL — ABNORMAL LOW (ref 12.0–15.0)
MCH: 29.8 pg (ref 26.0–34.0)
MCHC: 32.1 g/dL (ref 30.0–36.0)
MCV: 92.9 fL (ref 80.0–100.0)
Platelets: 277 K/uL (ref 150–400)
RBC: 3.93 MIL/uL (ref 3.87–5.11)
RDW: 12.4 % (ref 11.5–15.5)
WBC: 8.1 K/uL (ref 4.0–10.5)
nRBC: 0 % (ref 0.0–0.2)

## 2019-12-15 LAB — URINALYSIS, ROUTINE W REFLEX MICROSCOPIC
Bacteria, UA: NONE SEEN
Glucose, UA: 50 mg/dL — AB
Hgb urine dipstick: NEGATIVE
Ketones, ur: 20 mg/dL — AB
Nitrite: NEGATIVE
Protein, ur: 100 mg/dL — AB
Specific Gravity, Urine: 1.033 — ABNORMAL HIGH (ref 1.005–1.030)
pH: 5 (ref 5.0–8.0)

## 2019-12-15 LAB — COMPREHENSIVE METABOLIC PANEL
ALT: 17 U/L (ref 0–44)
AST: 18 U/L (ref 15–41)
Albumin: 3.5 g/dL (ref 3.5–5.0)
Alkaline Phosphatase: 72 U/L (ref 38–126)
Anion gap: 14 (ref 5–15)
BUN: 11 mg/dL (ref 6–20)
CO2: 20 mmol/L — ABNORMAL LOW (ref 22–32)
Calcium: 9 mg/dL (ref 8.9–10.3)
Chloride: 100 mmol/L (ref 98–111)
Creatinine, Ser: 0.86 mg/dL (ref 0.44–1.00)
GFR, Estimated: >60 mL/min (ref >60)
Glucose, Bld: 210 mg/dL — ABNORMAL HIGH (ref 70–99)
Potassium: 3.8 mmol/L (ref 3.5–5.1)
Sodium: 134 mmol/L — ABNORMAL LOW (ref 135–145)
Total Bilirubin: 0.6 mg/dL (ref 0.3–1.2)
Total Protein: 7 g/dL (ref 6.5–8.1)

## 2019-12-15 LAB — LIPASE, BLOOD: Lipase: 21 U/L (ref 11–51)

## 2019-12-15 MED ORDER — ACETAMINOPHEN 325 MG PO TABS
650.0000 mg | ORAL_TABLET | Freq: Once | ORAL | Status: AC
  Administered 2019-12-15: 650 mg via ORAL
  Filled 2019-12-15: qty 2

## 2019-12-15 NOTE — ED Triage Notes (Signed)
The pt has been ill since last week  Cold productive cough temp  Headache  She took a home covid test  This past tueday and it was negative  She has had the two vacccines  She had musinex at 1630 today

## 2019-12-16 ENCOUNTER — Other Ambulatory Visit: Payer: Self-pay

## 2019-12-16 ENCOUNTER — Telehealth: Payer: Self-pay

## 2019-12-16 LAB — RESP PANEL BY RT-PCR (FLU A&B, COVID) ARPGX2
Influenza A by PCR: NEGATIVE
Influenza B by PCR: NEGATIVE
SARS Coronavirus 2 by RT PCR: NEGATIVE

## 2019-12-16 MED ORDER — FLUCONAZOLE 150 MG PO TABS: 150.0000 mg | ORAL_TABLET | Freq: Once | ORAL | 0 refills | Status: AC

## 2019-12-16 MED ORDER — SODIUM CHLORIDE 0.9 % IV BOLUS
1000.0000 mL | Freq: Once | INTRAVENOUS | Status: AC
  Administered 2019-12-16: 1000 mL via INTRAVENOUS

## 2019-12-16 MED ORDER — HYDROCOD POLST-CPM POLST ER 10-8 MG/5ML PO SUER: 5.0000 mL | Freq: Every evening | ORAL | 0 refills | Status: DC | PRN

## 2019-12-16 MED ORDER — AMOXICILLIN 500 MG PO CAPS: 1000.0000 mg | ORAL_CAPSULE | Freq: Two times a day (BID) | ORAL | 0 refills | Status: DC

## 2019-12-16 MED ORDER — ALBUTEROL SULFATE HFA 108 (90 BASE) MCG/ACT IN AERS
2.0000 | INHALATION_SPRAY | Freq: Once | RESPIRATORY_TRACT | Status: AC
  Administered 2019-12-16: 2 via RESPIRATORY_TRACT
  Filled 2019-12-16: qty 6.7

## 2019-12-16 MED ORDER — AMOXICILLIN 500 MG PO CAPS
1000.0000 mg | ORAL_CAPSULE | Freq: Once | ORAL | Status: AC
  Administered 2019-12-16: 1000 mg via ORAL
  Filled 2019-12-16: qty 2

## 2019-12-16 NOTE — Telephone Encounter (Signed)
Spoke with patient regarding her hospital visit.  Patient tolerating symptoms at this time and will call if she needs anything.

## 2019-12-16 NOTE — Discharge Instructions (Addendum)
Take medications as prescribed. Treat any fever with Tylenol and/or ibuprofen.   Follow up with your doctor for recheck if no better in 2-3 days. Return to the ED with any new or worsening symptoms.

## 2019-12-16 NOTE — ED Provider Notes (Signed)
[facility] EMERGENCY DEPARTMENT Provider Note   CSN: [id number] Arrival date & time: 12/15/19  1754     History No chief complaint on file.   Melissa Benson is a 59 y.o. female.  Patient to ED for evaluation of cough, fever, congestion, progressive generalized weakness for the past one week. Cough is productive of a green phlegm without being bloody. She reports sinus congestion and frontal headache. She reports being COVID vaccinated and has had a negative test this week.   The history is provided by the patient. No language interpreter was used.       Past Medical History:  Diagnosis Date  . Anxiety   . Complication of anesthesia   . Diabetes mellitus   . GERD (gastroesophageal reflux disease)   . Headache    no issues since stop caffeine  . History of hiatal hernia   . History of migraine headaches   . History of urinary tract infection    last one 11/2014   . History of vertigo   . Hyperlipidemia   . Hypertension   . Neck pain, chronic   . OSA (obstructive sleep apnea)    on CPAP  . PONV (postoperative nausea and vomiting)   . Psoriasis   . Rosacea   . Weak urinary stream     Patient Active Problem List   Diagnosis Date Noted  . Family history of colon cancer 01/29/2018  . History of total abdominal hysterectomy 01/29/2018  . Colonic infarction (HCC) 01/29/2018  . Family history of Alzheimer's disease 01/29/2018  . Family history of breast cancer 11/21/2017  . Diabetes mellitus (HCC) 11/21/2017  . S/P exploratory laparotomy 02/02/2017  . Internal hernia 02/02/2017  . Chronic daily headache 07/27/2015  . 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 Controlled diabetes mellitus type II without complication (HCC) 11/10/2014  . Obesity 10/15/2012  . Hyperlipidemia 09/26/2012  . Chronic neck pain 12/04/2010  . History of migraine headaches 12/04/2010  . E11.9 06/03/2010  . Hypertension 06/03/2010  . OSA on CPAP 06/03/2010     Past Surgical History:  Procedure Laterality Date  . 2D ECHOCARDIOGRAM  07/20/2011   EF >55%, normal  . ABDOMINAL HYSTERECTOMY    . APPENDECTOMY    . CARDIOVASCULAR STRESS TEST  07/20/2011   Normal myocardial perfusion study, EKG negative for ischemia, no ECG changes  . CHOLECYSTECTOMY    . colonscopy      polyps removed   . DIAGNOSTIC LAPAROSCOPY    . DILATION AND CURETTAGE OF UTERUS    . LAPAROSCOPIC ROUX-EN-Y GASTRIC BYPASS WITH HIATAL HERNIA REPAIR N/A 01/12/2015   Procedure: LAPAROSCOPIC ROUX-EN-Y GASTRIC BYPASS WITH LYSIS OF ADHESIONS ;  Surgeon: Luretha Murphy, MD;  Location: [facility];  Service: General;  Laterality: N/A;  . LAPAROSCOPY  02/01/2017   Laparoscopic lysis of adhesions  . LAPAROSCOPY N/A 02/01/2017   Procedure: LAPAROSCOPY Lysis of adhesions;  Surgeon: Berna Bue, MD;  Location: [facility] OR;  Service: General;  Laterality: N/A;  . LAPAROTOMY N/A 02/01/2017   Procedure: excision of umbilical mass, small bowel resection;  Surgeon: Berna Bue, MD;  Location: [facility] OR;  Service: General;  Laterality: N/A;  . SPINE SURGERY     C6-C7 fusion x2  . SPINE SURGERY     herniated disc L4-L5  . TRIGGER FINGER RELEASE Left 03/14/2019   Procedure: RELEASE TRIGGER FINGER/A-1 PULLEY LEFT LONG FINGER;  Surgeon: Betha Loa, MD;  Location: [facility];  Service: Orthopedics;  Laterality: Left;  Bier block  . TUBAL LIGATION       OB History   No obstetric history on file.     Family History  Problem Relation Age of Onset  . Diabetes Mother   . Heart disease Mother   . Hypertension Mother   . Lung disease Father   . Heart disease Father   . Hypertension Father   . Diabetes Father   . Hypertension Sister   . Hyperlipidemia Brother   . Hypertension Brother   . Heart attack Maternal Grandmother   . Heart disease Maternal Grandmother   . Hypertension Maternal Grandfather   . Diabetes Maternal Grandfather   . Heart disease Maternal Grandfather      Social History   Tobacco Use  . Smoking status: Former Smoker    Packs/day: 2.50    Years: 8.00    Pack years: 20.00    Types: Cigarettes    Quit date: 05/18/1983    Years since quitting: 36.6  . Smokeless tobacco: Never Used  Vaping Use  . Vaping Use: Never used  Substance Use Topics  . Alcohol use: No    Alcohol/week: 0.0 standard drinks  . Drug use: No    Home Medications Prior to Admission medications   Medication Sig Start Date End Date Taking? Authorizing Provider  ALPRAZolam Prudy Feeler) 0.5 MG tablet Take 1 tablet (0.5 mg total) by mouth 2 (two) times daily as needed for anxiety. 09/21/19   Margaree Mackintosh, MD  b complex vitamins capsule Take 1 capsule by mouth daily.    [provider]  Biotin 54098 MCG TBDP Take 1 capsule by mouth daily.     [provider]  cyclobenzaprine (FLEXERIL) 10 MG tablet Take 1 tablet (10 mg total) by mouth at bedtime. Patient taking differently: Take 10 mg by mouth at bedtime as needed.  01/17/19   Margaree Mackintosh, MD  docusate sodium (COLACE) 100 MG capsule Take 200 mg by mouth 2 (two) times daily.    [provider]  gabapentin (NEURONTIN) 300 MG capsule TAKE 2 CAPSULES BY MOUTH THREE TIMES DAILY Patient taking differently: Take 600 mg by mouth 3 (three) times daily. 11/30/19   Margaree Mackintosh, MD  glucose blood test strip Use once daily 03/21/19   Reather Littler, MD  Insulin Pen Needle (B-D UF III MINI PEN NEEDLES) 31G X 5 MM MISC USE ON VICTOZA PEN EVERY DAY 08/02/19   Reather Littler, MD  MAGNESIUM PO Take 500 mg by mouth daily.     [provider]  multivitamin (VIT W/EXTRA C) CHEW chewable tablet Chew 1 tablet by mouth daily.    [provider]  nitrofurantoin, macrocrystal-monohydrate, (MACROBID) 100 MG capsule Take 1 capsule (100 mg total) by mouth 2 (two) times daily. 11/26/19   Margaree Mackintosh, MD  ofloxacin (OCUFLOX) 0.3 % ophthalmic solution 2 drops in each eye 4 times a day x 5 days 12/05/19   Margaree Mackintosh, MD  promethazine (PHENERGAN) 25 MG tablet TAKE 1 TABLET(25 MG) BY MOUTH EVERY 8 HOURS AS NEEDED FOR NAUSEA OR VOMITING Patient taking differently: Take 25 mg by mouth every 8 (eight) hours as needed for nausea or vomiting. 11/12/19   Margaree Mackintosh, MD  ramipril (ALTACE) 5 MG capsule TAKE 1 CAPSULE(5 MG) BY MOUTH DAILY Patient taking differently: Take 5 mg by mouth daily. 10/31/19   Margaree Mackintosh, MD  rosuvastatin (CRESTOR) 5 MG tablet TAKE 1 TABLET(5  MG) BY MOUTH DAILY Patient taking differently: Take 5 mg by mouth daily. 12/10/18   Margaree MackintoshBaxley, Mary J, MD  sucralfate (CARAFATE) 1 g tablet Take 1 g by mouth 4 (four) times daily.    [provider]  terconazole (TERAZOL 7) 0.4 % vaginal cream Use externally as directed twice a day. 10/24/19   Margaree MackintoshBaxley, Mary J, MD  valACYclovir (VALTREX) 500 MG tablet Take 500 mg by mouth 2 (two) times daily as needed (cold sores outbreak).    [provider]  VICTOZA 18 MG/3ML SOPN ADMINISTER 1.2 MG UNDER THE SKIN DAILY AT THE SAME TIME EVERY DAY Patient taking differently: Inject 1.2 mg into the skin daily. 10/31/19   Reather LittlerKumar, Ajay, MD    Allergies    Morphine and related, Morphine, Prednisone, Other, and Pantoprazole  Review of Systems   Review of Systems  Constitutional: Negative for chills and fever.  HENT: Positive for congestion and sinus pressure. Negative for sore throat and trouble swallowing.   Respiratory: Positive for cough.   Cardiovascular: Negative.   Gastrointestinal: Positive for nausea. Negative for diarrhea and vomiting.  Genitourinary: Positive for decreased urine volume. Negative for dysuria.  Musculoskeletal: Negative.  Negative for myalgias.  Skin: Negative.  Negative for rash.  Neurological: Positive for weakness.    Physical Exam Updated Vital Signs BP (!) 108/57 (BP Location: Left Arm)   Pulse 76   Temp 98.4 F (36.9 C) (Oral)   Resp 18   Ht 5\' 4"  (1.626 m)   Wt 82.6 kg   SpO2 100%   BMI 31.26 kg/m    Physical Exam Vitals and nursing note reviewed.  Constitutional:      Appearance: She is well-developed and well-nourished.  HENT:     Head: Normocephalic.  Cardiovascular:     Rate and Rhythm: Normal rate and regular rhythm.  Pulmonary:     Effort: Pulmonary effort is normal.     Breath sounds: Normal breath sounds. No wheezing, rhonchi or rales.  Chest:     Chest wall: No tenderness.  Abdominal:     General: Bowel sounds are normal.     Palpations: Abdomen is soft.     Tenderness: There is no abdominal tenderness. There is no guarding or rebound.  Musculoskeletal:        General: Normal range of motion.     Cervical back: Normal range of motion and neck supple.  Skin:    General: Skin is warm and dry.     Findings: No rash.  Neurological:     Mental Status: She is alert and oriented to person, place, and time.  Psychiatric:        Mood and Affect: Mood and affect normal.     ED Results / Procedures / Treatments   Labs (all labs ordered are listed, but only abnormal results are displayed) Labs Reviewed  COMPREHENSIVE METABOLIC PANEL - Abnormal; Notable for the following components:      Result Value   Sodium 134 (*)    CO2 20 (*)    Glucose, Bld 210 (*)    All other components within normal limits  CBC - Abnormal; Notable for the following components:   Hemoglobin 11.7 (*)    All other components within normal limits  URINALYSIS, ROUTINE W REFLEX MICROSCOPIC - Abnormal; Notable for the following components:   Color, Urine AMBER (*)    APPearance CLOUDY (*)    Specific Gravity, Urine 1.033 (*)    Glucose, UA 50 (*)  Bilirubin Urine SMALL (*)    Ketones, ur 20 (*)    Protein, ur 100 (*)    Leukocytes,Ua SMALL (*)    All other components within normal limits  RESP PANEL BY RT-PCR (FLU A&B, COVID) ARPGX2  LIPASE, BLOOD    EKG None  Radiology DG Chest 2 View  Result Date: 12/15/2019 CLINICAL DATA:  Cough, green mucous x 1 week, sob today, dizziness,  hx htn, medicated, past smoker EXAM: CHEST - 2 VIEW COMPARISON:  Chest radiograph 11/28/2017 FINDINGS: Stable cardiomediastinal contours. The lungs are clear. No pneumothorax or pleural effusion. Degenerative changes noted in the thoracic spine. No acute osseous abnormality. IMPRESSION: No acute cardiopulmonary process. Electronically Signed   By: Emmaline Kluver M.D.   On: 12/15/2019 19:39    Procedures Procedures (including critical care time)  Medications Ordered in ED Medications  sodium chloride 0.9 % bolus 1,000 mL (has no administration in time range)  albuterol (VENTOLIN HFA) 108 (90 Base) MCG/ACT inhaler 2 puff (has no administration in time range)  acetaminophen (TYLENOL) tablet 650 mg (650 mg Oral Given 12/15/19 1909)    ED Course  I have reviewed the triage vital signs and the nursing notes.  Pertinent labs & imaging results that were available during my care of the patient were reviewed by me and considered in my medical decision making (see chart for details).    MDM Rules/Calculators/A&P                          Patient to ED with ss/sxs as per HPI. COVID vaccinated, negative test this week.   Feel she would benefit from IVF's with poor intake and concentrated urine. UA negative for infection. CXR clear - no evidence pneumonia. VSS, no hypoxia. Viral panel ordered.   Viral panel negative. Feel it is reasonable after 10 days of symptoms with fever, h/o DM, to start abx. Amoxil started in ED. Albuterol inhaler provided for symptomatic relief of cough. Rx Tussionex for nighttime use also provided.   Encourage close PCP follow up if symptoms persist.  Final Clinical Impression(s) / ED Diagnoses Final diagnoses:  None   1. Sinusitis 2. Bronchitis  Rx / DC Orders ED Discharge Orders    None       Elpidio Anis, PA-C 12/16/19 0544    Zadie Rhine, MD 12/16/19 [id number]

## 2019-12-30 ENCOUNTER — Other Ambulatory Visit: Payer: Self-pay | Admitting: Internal Medicine

## 2020-01-14 ENCOUNTER — Other Ambulatory Visit: Payer: Self-pay | Admitting: Internal Medicine

## 2020-01-29 ENCOUNTER — Other Ambulatory Visit: Payer: Self-pay | Admitting: Internal Medicine

## 2020-01-29 ENCOUNTER — Other Ambulatory Visit: Payer: Self-pay | Admitting: Endocrinology

## 2020-01-31 ENCOUNTER — Telehealth: Payer: Self-pay | Admitting: Endocrinology

## 2020-01-31 ENCOUNTER — Telehealth: Payer: Self-pay | Admitting: Internal Medicine

## 2020-01-31 NOTE — Telephone Encounter (Signed)
Pt called just to inform that insurance will only cover 90 day supply of her Victoza and wanted [place] to be aware for further prescriptions .Marland KitchenMarland KitchenFYI

## 2020-01-31 NOTE — Telephone Encounter (Signed)
Melissa Benson [phone]  [name] called to say per her insurance company she needs all of her prescriptions to be for 90 days supplys

## 2020-01-31 NOTE — Telephone Encounter (Signed)
Noted,

## 2020-02-08 ENCOUNTER — Other Ambulatory Visit: Payer: Self-pay | Admitting: Internal Medicine

## 2020-02-18 ENCOUNTER — Other Ambulatory Visit: Payer: Self-pay | Admitting: Internal Medicine

## 2020-03-11 DIAGNOSIS — Z1231 Encounter for screening mammogram for malignant neoplasm of breast: Secondary | ICD-10-CM | POA: Diagnosis not present

## 2020-03-11 DIAGNOSIS — Z01419 Encounter for gynecological examination (general) (routine) without abnormal findings: Secondary | ICD-10-CM | POA: Diagnosis not present

## 2020-04-07 ENCOUNTER — Other Ambulatory Visit: Payer: BC Managed Care – PPO

## 2020-04-09 ENCOUNTER — Ambulatory Visit: Payer: BC Managed Care – PPO | Admitting: Endocrinology

## 2020-04-14 ENCOUNTER — Other Ambulatory Visit: Payer: Self-pay | Admitting: Internal Medicine

## 2020-04-15 ENCOUNTER — Other Ambulatory Visit: Payer: Self-pay

## 2020-04-15 ENCOUNTER — Other Ambulatory Visit: Payer: BC Managed Care – PPO

## 2020-04-15 ENCOUNTER — Other Ambulatory Visit (INDEPENDENT_AMBULATORY_CARE_PROVIDER_SITE_OTHER): Payer: BC Managed Care – PPO

## 2020-04-15 DIAGNOSIS — E1165 Type 2 diabetes mellitus with hyperglycemia: Secondary | ICD-10-CM

## 2020-04-15 LAB — BASIC METABOLIC PANEL
BUN: 8 mg/dL (ref 6–23)
CO2: 32 mEq/L (ref 19–32)
Calcium: 9.6 mg/dL (ref 8.4–10.5)
Chloride: 103 mEq/L (ref 96–112)
Creatinine, Ser: 0.74 mg/dL (ref 0.40–1.20)
GFR: 88 mL/min (ref >60.00)
Glucose, Bld: 111 mg/dL — ABNORMAL HIGH (ref 70–99)
Potassium: 3.9 mEq/L (ref 3.5–5.1)
Sodium: 142 mEq/L (ref 135–145)

## 2020-04-15 LAB — HEMOGLOBIN A1C: Hgb A1c MFr Bld: 6.9 % — ABNORMAL HIGH (ref 4.6–6.5)

## 2020-04-21 ENCOUNTER — Ambulatory Visit: Payer: BC Managed Care – PPO | Admitting: Endocrinology

## 2020-04-21 ENCOUNTER — Encounter: Payer: Self-pay | Admitting: Endocrinology

## 2020-04-21 ENCOUNTER — Other Ambulatory Visit: Payer: Self-pay

## 2020-04-21 VITALS — BP 124/76 | HR 88 | Ht 64.0 in | Wt 189.6 lb

## 2020-04-21 DIAGNOSIS — E1165 Type 2 diabetes mellitus with hyperglycemia: Secondary | ICD-10-CM | POA: Diagnosis not present

## 2020-04-21 MED ORDER — VICTOZA 18 MG/3ML ~~LOC~~ SOPN: 1.8000 mg | PEN_INJECTOR | Freq: Every day | SUBCUTANEOUS | 2 refills | Status: DC

## 2020-04-21 NOTE — Patient Instructions (Signed)
Take 1.8 on Victoza daily  Check blood sugars on waking up 3 days a week  Also check blood sugars about 2 hours after meals and do this after different meals by rotation  Recommended blood sugar levels on waking up are 90-130 and about 2 hours after meal is 130-160  Please bring your blood sugar monitor to each visit, thank you

## 2020-04-21 NOTE — Progress Notes (Signed)
Patient ID: Melissa Benson, female   DOB: 11-12-1960, 60 y.o.   MRN: [id number]           Reason for Appointment: Follow-up for Type 2 Diabetes   History of Present Illness:          Date of diagnosis of type 2 diabetes mellitus: 2009         Previous history: She had been on various oral hypoglycemic drugs in the past and was on relatively large doses of basal insulin. 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 she was able to stop insulin A1c had gone down to 6% in 2018 but was followed only about once a year She was tried on Metformin alone in 2018 for about a year but this was then stopped  Recent history:   A1c is relatively higher at 6.9 compared to 6.1  Last visit was 10/21   Non-insulin hypoglycemic drugs the patient is taking are: Victoza 1.2 mg daily in the morning   Current management, blood sugar patterns and problems identified:  She has gained weight since her last visit  Previously had been fairly good with losing weight and exercising  More recently has not done any walking  Also she is not checking blood sugars after meals, she says she falls asleep before the 2-hour timeframe  Also likely not consistent with diet in the evenings with snacks and some morning sugars are higher  Usually not missing any doses of the total  Fasting readings are generally ranging from 103-140, also has a few readings from her son on the meter        Side effects from medications have been: Nausea, some diarrhea from Metformin, some nausea from glipizide       Typical meal intake: Breakfast is usually having only almonds.  Will have chicken and vegetables at dinner, snacks will be cheese crackers.  Usually not eating out             Exercise:  Walking very little  Glucose monitoring:  done 0-1 times a day          Glucometer:Contour .       Blood Glucose readings by time of day and averages from meter download:  As above Previous readings:  PRE-MEAL Fasting Lunch Dinner Bedtime Overall  Glucose range:  102-154   122  104   Mean/median:  139    125   POST-MEAL PC Breakfast PC Lunch PC Dinner  Glucose range:   147, 175, 106   Mean/median:       Dietician visit, most recent: 2017,  Weight history:  Wt Readings from Last 3 Encounters:  04/21/20 189 lb 9.6 oz (86 kg)  12/15/19 182 lb 1.6 oz (82.6 kg)  12/05/19 182 lb (82.6 kg)    Glycemic control:   Lab Results  Component Value Date   HGBA1C 6.9 (H) 04/15/2020   HGBA1C 6.1 (H) 12/03/2019   HGBA1C 6.5 10/08/2019   Lab Results  Component Value Date   MICROALBUR 0.2 12/03/2019   LDLCALC 49 12/03/2019   CREATININE 0.74 04/15/2020   Lab Results  Component Value Date   MICRALBCREAT 2 12/03/2019    Lab Results  Component Value Date   FRUCTOSAMINE 263 03/19/2019    Lab on 04/15/2020  Component Date Value Ref Range Status  . Sodium 04/15/2020  142  135 - 145 mEq/L Final  . Potassium 04/15/2020 3.9  3.5 - 5.1 mEq/L Final  . Chloride 04/15/2020 103  96 - 112 mEq/L Final  . CO2 04/15/2020 32  19 - 32 mEq/L Final  . Glucose, Bld 04/15/2020 111* 70 - 99 mg/dL Final  . BUN 16/10/960404/13/2022 8  6 - 23 mg/dL Final  . Creatinine, Ser 04/15/2020 0.74  0.40 - 1.20 mg/dL Final  . GFR 54/09/811904/13/2022 88.00  >60.00 mL/min Final   Calculated using the CKD-EPI Creatinine Equation (2021)  . Calcium 04/15/2020 9.6  8.4 - 10.5 mg/dL Final  . Hgb J4NA1c MFr Bld 04/15/2020 6.9* 4.6 - 6.5 % Final   Glycemic Control Guidelines for People with Diabetes:Non Diabetic:  <6%Goal of Therapy: <7%Additional Action Suggested:  >8%     Allergies as of 04/21/2020      Reactions   Morphine And Related Nausea And Vomiting   Pt states also caused arm to turn black where medication was administered    Morphine Other (See Comments)   Prednisone Hives   Other Nausea And  Vomiting   TYLOX   Pantoprazole Hives, Rash      Medication List       Accurate as of April 21, 2020  9:53 AM. If you have any questions, ask your nurse or doctor.        ALPRAZolam 0.5 MG tablet Commonly known as: XANAX TAKE 1 TABLET(0.5 MG) BY MOUTH TWICE DAILY AS NEEDED FOR ANXIETY   amoxicillin 500 MG capsule Commonly known as: AMOXIL Take 2 capsules (1,000 mg total) by mouth 2 (two) times daily.   b complex vitamins capsule Take 1 capsule by mouth daily.   B-D UF III MINI PEN NEEDLES 31G X 5 MM Misc Generic drug: Insulin Pen Needle USE ON VICTOZA PEN EVERY DAY   Biotin 8295610000 MCG Tbdp Take 1 capsule by mouth daily.   chlorpheniramine-HYDROcodone 10-8 MG/5ML Suer Commonly known as: Tussionex Pennkinetic ER Take 5 mLs by mouth at bedtime as needed for cough.   cyclobenzaprine 10 MG tablet Commonly known as: FLEXERIL TAKE 1 TABLET(10 MG) BY MOUTH AT BEDTIME   docusate sodium 100 MG capsule Commonly known as: COLACE Take 200 mg by mouth 2 (two) times daily.   gabapentin 300 MG capsule Commonly known as: NEURONTIN TAKE 2 CAPSULES BY MOUTH THREE TIMES DAILY What changed:   how much to take  how to take this  when to take this  additional instructions   glucose blood test strip Use once daily   MAGNESIUM PO Take 500 mg by mouth daily.   multivitamin Chew chewable tablet Chew 1 tablet by mouth daily.   nitrofurantoin (macrocrystal-monohydrate) 100 MG capsule Commonly known as: Macrobid Take 1 capsule (100 mg total) by mouth 2 (two) times daily.   ofloxacin 0.3 % ophthalmic solution Commonly known as: Ocuflox 2 drops in each eye 4 times a day x 5 days   promethazine 25 MG tablet Commonly known as: PHENERGAN TAKE 1 TABLET(25 MG) BY MOUTH EVERY 8 HOURS AS NEEDED FOR NAUSEA OR VOMITING   ramipril 5 MG capsule Commonly known as: ALTACE TAKE 1 CAPSULE(5 MG) BY MOUTH DAILY What changed: See the new instructions.   rosuvastatin 5 MG  tablet Commonly known as: CRESTOR TAKE 1 TABLET(5 MG) BY MOUTH DAILY   sucralfate 1 g tablet Commonly known as: CARAFATE Take 1 g by mouth 4 (four) times daily as needed (ulcers).   terconazole 0.4 % vaginal cream Commonly known  as: TERAZOL 7 USE EXTERNALLY TWICE DAILY AS DIRECTED   valACYclovir 500 MG tablet Commonly known as: VALTREX Take 500 mg by mouth 2 (two) times daily as needed (cold sores outbreak).   Victoza 18 MG/3ML Sopn Generic drug: liraglutide Inject 1.8 mg into the skin daily. What changed: See the new instructions. Changed by: Reather Littler, MD       Allergies:  Allergies  Allergen Reactions  . Morphine And Related Nausea And Vomiting    Pt states also caused arm to turn black where medication was administered   . Morphine Other (See Comments)  . Prednisone Hives  . Other Nausea And Vomiting    TYLOX  . Pantoprazole Hives and Rash    Past Medical History:  Diagnosis Date  . Anxiety   . Complication of anesthesia   . Diabetes mellitus   . GERD (gastroesophageal reflux disease)   . Headache    no issues since stop caffeine  . History of hiatal hernia   . History of migraine headaches   . History of urinary tract infection    last one 11/2014   . History of vertigo   . Hyperlipidemia   . Hypertension   . Neck pain, chronic   . OSA (obstructive sleep apnea)    on CPAP  . PONV (postoperative nausea and vomiting)   . Psoriasis   . Rosacea   . Weak urinary stream     Past Surgical History:  Procedure Laterality Date  . 2D ECHOCARDIOGRAM  07/20/2011   EF >55%, normal  . ABDOMINAL HYSTERECTOMY    . APPENDECTOMY    . CARDIOVASCULAR STRESS TEST  07/20/2011   Normal myocardial perfusion study, EKG negative for ischemia, no ECG changes  . CHOLECYSTECTOMY    . colonscopy      polyps removed   . DIAGNOSTIC LAPAROSCOPY    . DILATION AND CURETTAGE OF UTERUS    . LAPAROSCOPIC ROUX-EN-Y GASTRIC BYPASS WITH HIATAL HERNIA REPAIR N/A 01/12/2015    Procedure: LAPAROSCOPIC ROUX-EN-Y GASTRIC BYPASS WITH LYSIS OF ADHESIONS ;  Surgeon: Luretha Murphy, MD;  Location: [facility];  Service: General;  Laterality: N/A;  . LAPAROSCOPY  02/01/2017   Laparoscopic lysis of adhesions  . LAPAROSCOPY N/A 02/01/2017   Procedure: LAPAROSCOPY Lysis of adhesions;  Surgeon: Berna Bue, MD;  Location: [facility];  Service: General;  Laterality: N/A;  . LAPAROTOMY N/A 02/01/2017   Procedure: excision of umbilical mass, small bowel resection;  Surgeon: Berna Bue, MD;  Location: [facility];  Service: General;  Laterality: N/A;  . SPINE SURGERY     C6-C7 fusion x2  . SPINE SURGERY     herniated disc L4-L5  . TRIGGER FINGER RELEASE Left 03/14/2019   Procedure: RELEASE TRIGGER FINGER/A-1 PULLEY LEFT LONG FINGER;  Surgeon: Betha Loa, MD;  Location: [facility];  Service: Orthopedics;  Laterality: Left;  Bier block  . TUBAL LIGATION      Family History  Problem Relation Age of Onset  . Diabetes Mother   . Heart disease Mother   . Hypertension Mother   . Lung disease Father   . Heart disease Father   . Hypertension Father   . Diabetes Father   . Hypertension Sister   . Hyperlipidemia Brother   . Hypertension Brother   . Heart attack Maternal Grandmother   . Heart disease Maternal Grandmother   . Hypertension Maternal Grandfather   . Diabetes Maternal Grandfather   . Heart disease Maternal Grandfather  Social History:  reports that she quit smoking about 36 years ago. Her smoking use included cigarettes. She has a 20.00 pack-year smoking history. She has never used smokeless tobacco. She reports that she does not drink alcohol and does not use drugs.   Review of Systems  Lipid history: Has been treated for cardiovascular risk reduction with Crestor 5 mg daily    Lab Results  Component Value Date   CHOL 118 12/03/2019   HDL 49 (L) 12/03/2019   LDLCALC 49 12/03/2019   TRIG 110 12/03/2019   CHOLHDL 2.4 12/03/2019            Hypertension: Has been controlled on ramipril 5 mg daily  Home readings being checked periodically  BP Readings from Last 3 Encounters:  04/21/20 124/76  12/16/19 (!) 132/59  12/05/19 120/80    Most recent eye exam was in 12/20  Most recent foot exam: 2/21  Currently known complications of diabetes: None known  OSTEOPENIA: T score is -1.4 at the hip  LABS:  Lab on 04/15/2020  Component Date Value Ref Range Status  . Sodium 04/15/2020 142  135 - 145 mEq/L Final  . Potassium 04/15/2020 3.9  3.5 - 5.1 mEq/L Final  . Chloride 04/15/2020 103  96 - 112 mEq/L Final  . CO2 04/15/2020 32  19 - 32 mEq/L Final  . Glucose, Bld 04/15/2020 111* 70 - 99 mg/dL Final  . BUN 40/10/2723 8  6 - 23 mg/dL Final  . Creatinine, Ser 04/15/2020 0.74  0.40 - 1.20 mg/dL Final  . GFR 36/64/4034 88.00  >60.00 mL/min Final   Calculated using the CKD-EPI Creatinine Equation (2021)  . Calcium 04/15/2020 9.6  8.4 - 10.5 mg/dL Final  . Hgb V4Q MFr Bld 04/15/2020 6.9* 4.6 - 6.5 % Final   Glycemic Control Guidelines for People with Diabetes:Non Diabetic:  <6%Goal of Therapy: <7%Additional Action Suggested:  >8%     Physical Examination:  BP 124/76   Pulse 88   Ht 5\' 4"  (1.626 m)   Wt 189 lb 9.6 oz (86 kg)   SpO2 99%   BMI 32.54 kg/m    ASSESSMENT:  Diabetes type 2  See history of present illness for detailed discussion of current diabetes management, blood sugar patterns and problems identified   A1c has gone up to 6.9  She has not done as well with inconsistent lifestyle management and weight gain lately She is doing relatively better with taking her Victoza daily but may occasionally forget also Previously intolerant to metformin, she thinks it makes her eat more  Hypertension: Controlled and followed by her PCP  PLAN:     Victoza 1.8 mg daily  More consistent monitoring after meals even if she takes reading 1 hour later  More consistent follow-up  Consider adding SGLT2 drug if  control not improved in the next visit   Patient Instructions  Take 1.8 on Victoza daily  Check blood sugars on waking up 3 days a week  Also check blood sugars about 2 hours after meals and do this after different meals by rotation  Recommended blood sugar levels on waking up are 90-130 and about 2 hours after meal is 130-160  Please bring your blood sugar monitor to each visit, thank you        04/21/2020, 9:53 AM   Note: This office note was prepared with Dragon voice recognition system technology. Any transcriptional errors that result from this process are unintentional.

## 2020-05-02 ENCOUNTER — Other Ambulatory Visit: Payer: Self-pay | Admitting: Endocrinology

## 2020-05-20 DIAGNOSIS — H40033 Anatomical narrow angle, bilateral: Secondary | ICD-10-CM | POA: Diagnosis not present

## 2020-05-20 DIAGNOSIS — E119 Type 2 diabetes mellitus without complications: Secondary | ICD-10-CM | POA: Diagnosis not present

## 2020-05-20 DIAGNOSIS — Z961 Presence of intraocular lens: Secondary | ICD-10-CM | POA: Diagnosis not present

## 2020-05-20 DIAGNOSIS — H40013 Open angle with borderline findings, low risk, bilateral: Secondary | ICD-10-CM | POA: Diagnosis not present

## 2020-05-20 LAB — HM DIABETES EYE EXAM

## 2020-05-27 ENCOUNTER — Telehealth: Payer: Self-pay | Admitting: Internal Medicine

## 2020-05-27 NOTE — Telephone Encounter (Signed)
LVM to CB to reschedule appointment for week of 06/09/2020

## 2020-06-03 NOTE — Telephone Encounter (Signed)
Reschedule

## 2020-06-08 DIAGNOSIS — N390 Urinary tract infection, site not specified: Secondary | ICD-10-CM | POA: Diagnosis not present

## 2020-06-08 DIAGNOSIS — N760 Acute vaginitis: Secondary | ICD-10-CM | POA: Diagnosis not present

## 2020-06-09 ENCOUNTER — Other Ambulatory Visit: Payer: BC Managed Care – PPO | Admitting: Internal Medicine

## 2020-06-09 ENCOUNTER — Other Ambulatory Visit: Payer: Self-pay

## 2020-06-09 DIAGNOSIS — E1169 Type 2 diabetes mellitus with other specified complication: Secondary | ICD-10-CM

## 2020-06-09 DIAGNOSIS — I10 Essential (primary) hypertension: Secondary | ICD-10-CM | POA: Diagnosis not present

## 2020-06-09 DIAGNOSIS — E785 Hyperlipidemia, unspecified: Secondary | ICD-10-CM | POA: Diagnosis not present

## 2020-06-10 LAB — HEPATIC FUNCTION PANEL
AG Ratio: 1.8 (calc) (ref 1.0–2.5)
ALT: 15 U/L (ref 6–29)
AST: 18 U/L (ref 10–35)
Albumin: 4.3 g/dL (ref 3.6–5.1)
Alkaline phosphatase (APISO): 81 U/L (ref 37–153)
Bilirubin, Direct: 0.1 mg/dL (ref 0.0–0.2)
Globulin: 2.4 g/dL (calc) (ref 1.9–3.7)
Indirect Bilirubin: 0.2 mg/dL (calc) (ref 0.2–1.2)
Total Bilirubin: 0.3 mg/dL (ref 0.2–1.2)
Total Protein: 6.7 g/dL (ref 6.1–8.1)

## 2020-06-10 LAB — LIPID PANEL
Cholesterol: 113 mg/dL (ref <200)
HDL: 51 mg/dL (ref >=50)
LDL Cholesterol (Calc): 46 mg/dL (calc)
Non-HDL Cholesterol (Calc): 62 mg/dL (calc) (ref <130)
Total CHOL/HDL Ratio: 2.2 (calc) (ref <5.0)
Triglycerides: 81 mg/dL (ref <150)

## 2020-06-10 LAB — HEMOGLOBIN A1C
Hgb A1c MFr Bld: 6.6 % of total Hgb — ABNORMAL HIGH (ref <5.7)
Mean Plasma Glucose: 143 mg/dL
eAG (mmol/L): 7.9 mmol/L

## 2020-06-11 ENCOUNTER — Ambulatory Visit: Payer: BC Managed Care – PPO | Admitting: Internal Medicine

## 2020-06-22 ENCOUNTER — Telehealth: Payer: Self-pay | Admitting: Internal Medicine

## 2020-06-22 ENCOUNTER — Other Ambulatory Visit: Payer: Self-pay | Admitting: Internal Medicine

## 2020-06-22 NOTE — Telephone Encounter (Signed)
Rx was sent this morning, patient aware.

## 2020-06-22 NOTE — Telephone Encounter (Signed)
Melissa Benson [id number]  [name] called to say she needs a 90 day supply of below medication per her insurance company.  gabapentin (NEURONTIN) 300 MG capsule  [id number] - [place], [place] - [address] AT [facility] [address] [facility] [address] Phone:  [phone]  Fax:  [phone]

## 2020-06-29 ENCOUNTER — Ambulatory Visit: Payer: BC Managed Care – PPO | Admitting: Internal Medicine

## 2020-06-29 ENCOUNTER — Other Ambulatory Visit: Payer: Self-pay

## 2020-06-29 VITALS — BP 110/80 | HR 90 | Ht 64.0 in | Wt 187.0 lb

## 2020-06-29 DIAGNOSIS — Z8659 Personal history of other mental and behavioral disorders: Secondary | ICD-10-CM | POA: Diagnosis not present

## 2020-06-29 DIAGNOSIS — Z9884 Bariatric surgery status: Secondary | ICD-10-CM | POA: Diagnosis not present

## 2020-06-29 DIAGNOSIS — I10 Essential (primary) hypertension: Secondary | ICD-10-CM | POA: Diagnosis not present

## 2020-06-29 DIAGNOSIS — M542 Cervicalgia: Secondary | ICD-10-CM

## 2020-06-29 DIAGNOSIS — F411 Generalized anxiety disorder: Secondary | ICD-10-CM

## 2020-06-29 DIAGNOSIS — E1169 Type 2 diabetes mellitus with other specified complication: Secondary | ICD-10-CM | POA: Diagnosis not present

## 2020-06-29 DIAGNOSIS — E785 Hyperlipidemia, unspecified: Secondary | ICD-10-CM

## 2020-06-29 DIAGNOSIS — G8929 Other chronic pain: Secondary | ICD-10-CM

## 2020-06-29 DIAGNOSIS — Z6832 Body mass index (BMI) 32.0-32.9, adult: Secondary | ICD-10-CM

## 2020-06-29 DIAGNOSIS — R519 Headache, unspecified: Secondary | ICD-10-CM

## 2020-06-29 NOTE — Progress Notes (Signed)
   Subjective:    Patient ID: Melissa Benson, female    DOB: March 16, 1960, 60 y.o.   MRN: [id number]  HPI 60 year old Female for 6 month recheck. Has had eye exam  and surgical procedures by Dr. Dione Benson in September and October.  We do not have a copy of Dr. Laruth Benson report  Hgb AIC stable at 6.6% and lipids are normal on statin medication.  Liver functions are stable.  History of chronic neck pain treated with gabapentin  She has type 2 diabetes mellitus followed by [name].  Currently treated with Mounjaro, Amaryl, low-dose Crestor 5 mg daily, ramipril, gabapentin, Phenergan as needed for nausea.  Also has Flexeril on hand to take at bedtime.  Work is stressful but she is doing okay.  No new complaints.  Review of Systems no chest pain, shortness of breath, GI issues     Objective:   Physical Exam BP 110/80, pulse 90 and regular, pulse oximetry 97% weight 187 pounds BMI 32.10  Skin: Warm and dry.  No cervical adenopathy.  No thyromegaly.  No carotid bruits.  Chest is clear to auscultation without rales or wheezing.  Cardiac exam: Regular rate and rhythm normal S1 and S2.  No lower extremity pitting edema.       Assessment & Plan:  Type 2 diabetes mellitus followed by [name] and stable with hemoglobin A1c 6.6%. Takes Mounjaro and Amaryl.  Hyperlipidemia-HDL has improved from 49 in November 2021 to 51 She is on statin medication and lipid panel is normal.  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 coli UTI October 2021  History of small bowel obstruction due to adhesions January 2019 requiring surgery.  History of mild L5 radiculopathy  Plan: Continue current medications and follow-up in 6 months for health maintenance exam.  Recommend COVID booster

## 2020-06-29 NOTE — Patient Instructions (Addendum)
It was a pleasure to see you today.Labs are stable. RTC in 6 months.  Continue current medications.  Continue to work on diet exercise and weight loss

## 2020-07-08 ENCOUNTER — Other Ambulatory Visit (INDEPENDENT_AMBULATORY_CARE_PROVIDER_SITE_OTHER): Payer: BC Managed Care – PPO

## 2020-07-08 ENCOUNTER — Other Ambulatory Visit: Payer: Self-pay

## 2020-07-08 DIAGNOSIS — E1165 Type 2 diabetes mellitus with hyperglycemia: Secondary | ICD-10-CM

## 2020-07-08 LAB — BASIC METABOLIC PANEL
BUN: 13 mg/dL (ref 6–23)
CO2: 32 mEq/L (ref 19–32)
Calcium: 9.7 mg/dL (ref 8.4–10.5)
Chloride: 101 mEq/L (ref 96–112)
Creatinine, Ser: 0.75 mg/dL (ref 0.40–1.20)
GFR: 86.46 mL/min (ref >60.00)
Glucose, Bld: 127 mg/dL — ABNORMAL HIGH (ref 70–99)
Potassium: 4.5 mEq/L (ref 3.5–5.1)
Sodium: 139 mEq/L (ref 135–145)

## 2020-07-08 LAB — HEMOGLOBIN A1C: Hgb A1c MFr Bld: 6.8 % — ABNORMAL HIGH (ref 4.6–6.5)

## 2020-07-13 ENCOUNTER — Telehealth: Payer: Self-pay | Admitting: Internal Medicine

## 2020-07-13 NOTE — Progress Notes (Signed)
Patient ID: Melissa Benson, female   DOB: [birth year], 60 y.o.   MRN: [id number]           Reason for Appointment: Follow-up for Type 2 Diabetes   History of Present Illness:          Date of diagnosis of type 2 diabetes mellitus: 2009         Previous history: She had been on various oral hypoglycemic drugs in the past and was on relatively large doses of basal insulin. 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 she was able to stop insulin A1c had gone down to 6% in 2018 but was followed only about once a year She was tried on Metformin alone in 2018 for about a year but this was then stopped  Recent history:   A1c is still relatively higher at 6.8 compared to 6.1  Non-insulin hypoglycemic drugs the patient is taking are: Victoza 1.8 mg daily in the morning   Current management, blood sugar patterns and problems identified: She has tolerated 1.8 mg Victoza However even though her weight may be slightly better blood sugars are not consistently controlled She is not able to do much walking because of foot pain  She is generally trying to modify her diet with reduced calories and high fat meals Likely has occasional high readings based on her diet at night and highest reading was 221 Fasting blood sugars are fluctuating but not consistently high Only rarely will forget her Victoza.        Side effects from medications have been: Nausea, some diarrhea from Metformin, some nausea from glipizide       Typical meal intake: Breakfast is usually having only almonds.  Will have chicken and vegetables at dinner, snacks will be cheese crackers.  Usually not eating out             Exercise:  Walking very little  Glucose monitoring:  done 0-1 times a day         Glucometer: Contour next.       Blood Glucose readings by time of  day and averages from meter download:   PRE-MEAL Fasting Lunch Dinner Bedtime Overall  Glucose range: 119-145 90-174  99-221   Mean/median: 138 121  127 131    Dietician visit, most recent: 2017,  Weight history:  Wt Readings from Last 3 Encounters:  07/14/20 185 lb 6.4 oz (84.1 kg)  06/29/20 187 lb (84.8 kg)  04/21/20 189 lb 9.6 oz (86 kg)    Glycemic control:   Lab Results  Component Value Date   HGBA1C 6.8 (H) 07/08/2020   HGBA1C 6.6 (H) 06/09/2020   HGBA1C 6.9 (H) 04/15/2020   Lab Results  Component Value Date   MICROALBUR 0.2 12/03/2019   LDLCALC 46 06/09/2020   CREATININE 0.75 07/08/2020   Lab Results  Component Value Date   MICRALBCREAT 2 12/03/2019    Lab Results  Component Value Date   FRUCTOSAMINE 263 03/19/2019    Lab on 07/08/2020  Component Date Value Ref Range Status   Sodium 07/08/2020 139  135 - 145 mEq/L Final   Potassium 07/08/2020 4.5  3.5 - 5.1 mEq/L Final   Chloride 07/08/2020 101  96 - 112 mEq/L Final   CO2 07/08/2020 32  19 - 32 mEq/L Final   Glucose, Bld 07/08/2020 127 (A)  70 - 99 mg/dL Final   BUN 16/10/9602 13  6 - 23 mg/dL Final   Creatinine, Ser 07/08/2020 0.75  0.40 - 1.20 mg/dL Final   GFR 54/09/8117 86.46  >60.00 mL/min Final   Calculated using the CKD-EPI Creatinine Equation (2021)   Calcium 07/08/2020 9.7  8.4 - 10.5 mg/dL Final   Hgb J4N MFr Bld 07/08/2020 6.8 (A) 4.6 - 6.5 % Final   Glycemic Control Guidelines for People with Diabetes:Non Diabetic:  <6%Goal of Therapy: <7%Additional Action Suggested:  >8%     Allergies as of 07/14/2020       Reactions   Morphine And Related Nausea And Vomiting   Pt states also caused arm to turn black where medication was administered    Morphine Other (See Comments)   Prednisone Hives   Other Nausea And Vomiting   TYLOX   Pantoprazole Hives, Rash        Medication List        Accurate as of July 14, 2020  8:35 AM. If you have any questions, ask your nurse or doctor.           ALPRAZolam 0.5 MG tablet Commonly known as: XANAX TAKE 1 TABLET(0.5 MG) BY MOUTH TWICE DAILY AS NEEDED FOR ANXIETY   b complex vitamins capsule Take 1 capsule by mouth daily.   B-D UF III MINI PEN NEEDLES 31G X 5 MM Misc Generic drug: Insulin Pen Needle USE ON VICTOZA PEN EVERY DAY   Biotin 82956 MCG Tbdp Take 1 capsule by mouth daily.   cyclobenzaprine 10 MG tablet Commonly known as: FLEXERIL TAKE 1 TABLET(10 MG) BY MOUTH AT BEDTIME   docusate sodium 100 MG capsule Commonly known as: COLACE Take 200 mg by mouth 2 (two) times daily.   gabapentin 300 MG capsule Commonly known as: NEURONTIN TAKE 2 CAPSULES BY MOUTH THREE TIMES DAILY   glucose blood test strip Use once daily   MAGNESIUM PO Take 500 mg by mouth daily.   multivitamin Chew chewable tablet Chew 1 tablet by mouth daily.   promethazine 25 MG tablet Commonly known as: PHENERGAN TAKE 1 TABLET(25 MG) BY MOUTH EVERY 8 HOURS AS NEEDED FOR NAUSEA OR VOMITING   ramipril 5 MG capsule Commonly known as: ALTACE TAKE 1 CAPSULE(5 MG) BY MOUTH DAILY What changed: See the new instructions.   rosuvastatin 5 MG tablet Commonly known as: CRESTOR TAKE 1 TABLET(5 MG) BY MOUTH DAILY   sucralfate 1 g tablet Commonly known as: CARAFATE Take 1 g by mouth 4 (four) times daily as needed (ulcers).   terconazole 0.4 % vaginal cream Commonly known as: TERAZOL 7 USE EXTERNALLY TWICE DAILY AS DIRECTED   valACYclovir 500 MG tablet Commonly known as: VALTREX Take 500 mg by mouth 2 (two) times daily as needed (cold sores outbreak).   Victoza 18 MG/3ML Sopn Generic drug: liraglutide ADMINISTER 1.2 MG UNDER THE SKIN AT THE SAME TIME EVERY DAY        Allergies:  Allergies  Allergen Reactions   Morphine And Related Nausea And Vomiting    Pt states also caused arm to turn black where medication was administered    Morphine Other (See Comments)   Prednisone Hives   Other Nausea And Vomiting    TYLOX    Pantoprazole Hives and Rash    Past Medical History:  Diagnosis Date   Anxiety    Complication of anesthesia    Diabetes mellitus    GERD (gastroesophageal reflux disease)    Headache  no issues since stop caffeine   History of hiatal hernia    History of migraine headaches    History of urinary tract infection    last one 11/2014    History of vertigo    Hyperlipidemia    Hypertension    Neck pain, chronic    OSA (obstructive sleep apnea)    on CPAP   PONV (postoperative nausea and vomiting)    Psoriasis    Rosacea    Weak urinary stream     Past Surgical History:  Procedure Laterality Date   2D ECHOCARDIOGRAM  07/20/2011   EF >55%, normal   ABDOMINAL HYSTERECTOMY     APPENDECTOMY     CARDIOVASCULAR STRESS TEST  07/20/2011   Normal myocardial perfusion study, EKG negative for ischemia, no ECG changes   CHOLECYSTECTOMY     colonscopy      polyps removed    DIAGNOSTIC LAPAROSCOPY     DILATION AND CURETTAGE OF UTERUS     LAPAROSCOPIC ROUX-EN-Y GASTRIC BYPASS WITH HIATAL HERNIA REPAIR N/A 01/12/2015   Procedure: LAPAROSCOPIC ROUX-EN-Y GASTRIC BYPASS WITH LYSIS OF ADHESIONS ;  Surgeon: Luretha MurphyMatthew Martin, MD;  Location: [facility];  Service: General;  Laterality: N/A;   LAPAROSCOPY  02/01/2017   Laparoscopic lysis of adhesions   LAPAROSCOPY N/A 02/01/2017   Procedure: LAPAROSCOPY Lysis of adhesions;  Surgeon: Berna Bueonnor, Chelsea A, MD;  Location: [facility];  Service: General;  Laterality: N/A;   LAPAROTOMY N/A 02/01/2017   Procedure: excision of umbilical mass, small bowel resection;  Surgeon: Berna Bueonnor, Chelsea A, MD;  Location: [facility];  Service: General;  Laterality: N/A;   SPINE SURGERY     C6-C7 fusion x2   SPINE SURGERY     herniated disc L4-L5   TRIGGER FINGER RELEASE Left 03/14/2019   Procedure: RELEASE TRIGGER FINGER/A-1 PULLEY LEFT LONG FINGER;  Surgeon: Betha LoaKuzma, Kevin, MD;  [facility];  Service: Orthopedics;  Laterality: Left;  Bier block   TUBAL LIGATION       Family History  Problem Relation Age of Onset   Diabetes Mother    Heart disease Mother    Hypertension Mother    Lung disease Father    Heart disease Father    Hypertension Father    Diabetes Father    Hypertension Sister    Hyperlipidemia Brother    Hypertension Brother    Heart attack Maternal Grandmother    Heart disease Maternal Grandmother    Hypertension Maternal Grandfather    Diabetes Maternal Grandfather    Heart disease Maternal Grandfather     Social History:  reports that she quit smoking about 37 years ago. Her smoking use included cigarettes. She has a 20.00 pack-year smoking history. She has never used smokeless tobacco. She reports that she does not drink alcohol and does not use drugs.   Review of Systems  Lipid history: Has been treated for cardiovascular risk reduction with Crestor 5 mg daily    Lab Results  Component Value Date   CHOL 113 06/09/2020   HDL 51 06/09/2020   LDLCALC 46 06/09/2020   TRIG 81 06/09/2020   CHOLHDL 2.2 06/09/2020           Hypertension: Has been on ramipril 5 mg daily prescribed by PCP  Home readings being checked periodically but not recently  BP Readings from Last 3 Encounters:  07/14/20 140/84  06/29/20 110/80  04/21/20 124/76    Most recent eye exam was in 12/20  Most recent  foot exam: 2/21  Currently known complications of diabetes: None known  OSTEOPENIA: T score is -1.4 at the hip  LABS:  Lab on 07/08/2020  Component Date Value Ref Range Status   Sodium 07/08/2020 139  135 - 145 mEq/L Final   Potassium 07/08/2020 4.5  3.5 - 5.1 mEq/L Final   Chloride 07/08/2020 101  96 - 112 mEq/L Final   CO2 07/08/2020 32  19 - 32 mEq/L Final   Glucose, Bld 07/08/2020 127 (A) 70 - 99 mg/dL Final   BUN 93/90/3009 13  6 - 23 mg/dL Final   Creatinine, Ser 07/08/2020 0.75  0.40 - 1.20 mg/dL Final   GFR 23/30/0762 86.46  >60.00 mL/min Final   Calculated using the CKD-EPI Creatinine Equation (2021)   Calcium  07/08/2020 9.7  8.4 - 10.5 mg/dL Final   Hgb U6J MFr Bld 07/08/2020 6.8 (A) 4.6 - 6.5 % Final   Glycemic Control Guidelines for People with Diabetes:Non Diabetic:  <6%Goal of Therapy: <7%Additional Action Suggested:  >8%     Physical Examination:  BP 140/84   Pulse 98   Ht 5' 4.5" (1.638 m)   Wt 185 lb 6.4 oz (84.1 kg)   SpO2 (!) 86%   BMI 31.33 kg/m    ASSESSMENT:  Diabetes type 2 non-insulin-dependent  See history of present illness for detailed discussion of current diabetes management, blood sugar patterns and problems identified   A1c has been up at 6.8  Although her level of control may be adequate she is still has some high postprandial readings Also can do better with weight loss, currently not exercising also  Hypertension: Fairly well controlled but will let her follow-up with PCP, likely needs to be checking at home also  Foot cramps: Advised her to discuss with PCP, unlikely to be from neuropathy  PLAN:    MOUNJARO 2.5 mg instead of Victoza 1.8 mg daily Sample box for 30 days given Demonstrated how to use the injection device Patient information booklet given including information on co-pay card and video website If her blood sugars are higher with switching to [facility] she can add 1 mg Amaryl in the evening temporarily, printed prescription given for this She will call to get the prescription for the 5 mg Mounjaro dose once the samples give out Consider adding SGLT2 drug if control not improved in the next visit. More blood sugar monitoring after meals instead of mostly in the morning   There are no Patient Instructions on file for this visit.   Total visit time including counseling = 30 minutes   Reather Littler 07/14/2020, 8:35 AM   Note: This office note was prepared with Dragon voice recognition system technology. Any transcriptional errors that result from this process are unintentional.

## 2020-07-13 NOTE — Telephone Encounter (Signed)
scheduled

## 2020-07-13 NOTE — Telephone Encounter (Signed)
Lakia Gritton [phone]  Annalyse called to say on Friday she had an episode at work where she all of sudden was hurting in the middle of her chest under her bra line, she had to sit down in the floor because she thought she was going to pass out. It lasted about 5 minutes. She broke out in a cold sweat. Then this weekend she had diarrhea and was tired and maybe irritable all weekend. Should she come in or be concern?

## 2020-07-14 ENCOUNTER — Other Ambulatory Visit: Payer: Self-pay

## 2020-07-14 ENCOUNTER — Encounter: Payer: Self-pay | Admitting: Endocrinology

## 2020-07-14 ENCOUNTER — Ambulatory Visit: Payer: BC Managed Care – PPO | Admitting: Endocrinology

## 2020-07-14 ENCOUNTER — Other Ambulatory Visit: Payer: Self-pay | Admitting: Internal Medicine

## 2020-07-14 VITALS — BP 140/84 | HR 98 | Ht 64.5 in | Wt 185.4 lb

## 2020-07-14 DIAGNOSIS — I10 Essential (primary) hypertension: Secondary | ICD-10-CM | POA: Diagnosis not present

## 2020-07-14 DIAGNOSIS — E1165 Type 2 diabetes mellitus with hyperglycemia: Secondary | ICD-10-CM

## 2020-07-14 MED ORDER — GLIMEPIRIDE 1 MG PO TABS: 1.0000 mg | ORAL_TABLET | Freq: Every day | ORAL | 1 refills | Status: DC

## 2020-07-14 NOTE — Patient Instructions (Addendum)
Glimeperide 1 mg at supper  Check blood sugars on waking up 2-3 days a week  Also check blood sugars about 2 hours after meals and do this after different meals by rotation  Recommended blood sugar levels on waking up are 90-130 and about 2 hours after meal is 130-160  Please bring your blood sugar monitor to each visit, thank you  Mounjaro weekly 2.5 dose x4 then call for 5mg  dose

## 2020-07-16 ENCOUNTER — Other Ambulatory Visit: Payer: Self-pay | Admitting: Endocrinology

## 2020-07-17 ENCOUNTER — Other Ambulatory Visit: Payer: Self-pay

## 2020-07-17 ENCOUNTER — Encounter: Payer: Self-pay | Admitting: Internal Medicine

## 2020-07-17 ENCOUNTER — Ambulatory Visit: Payer: BC Managed Care – PPO | Admitting: Internal Medicine

## 2020-07-17 VITALS — BP 150/90 | HR 92 | Ht 64.5 in | Wt 184.0 lb

## 2020-07-17 DIAGNOSIS — R079 Chest pain, unspecified: Secondary | ICD-10-CM | POA: Diagnosis not present

## 2020-07-17 DIAGNOSIS — I10 Essential (primary) hypertension: Secondary | ICD-10-CM | POA: Diagnosis not present

## 2020-07-17 DIAGNOSIS — S22000S Wedge compression fracture of unspecified thoracic vertebra, sequela: Secondary | ICD-10-CM

## 2020-07-17 DIAGNOSIS — Z9884 Bariatric surgery status: Secondary | ICD-10-CM | POA: Diagnosis not present

## 2020-07-17 NOTE — Progress Notes (Signed)
   Subjective:    Patient ID: Melissa Benson, female    DOB: 06-Aug-1960, 60 y.o.   MRN: [id number]  HPI  60 year old Female with lower  mid chest pain at work recently.  Is not sure what provoked this.  Work was going well that day.  Felt dizzy and nauseated with chest pain.  Did not sweat.  She was feeling so poorly she had to leave work.  Did not check her Accu-Chek at the time to see if she had low glucose.  She is concerned about heart issues.  She has a history of anxiety treated with Xanax.  She has a history of diabetes mellitus treated by Dr. Lucianne Muss and is stable.  Apparently this episode involve dizziness, nauseated with some chest pain for some 5 minutes and some generalized weakness.  She did not seek medical attention that day.  Chest pain was in lower sternum.  She denied recent heavy lifting.  Had laryngitis in early July- likely exposed to someone at work.  She has anxiety and situational stress.  Tells me today that she wants note to wear tennis shoes to work. 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  Saw Dr. Lucianne Muss July 12th and now on [facility]. Is to take Amaryl if glucose remains elevated but so far it has been stable.  History of gastric bypass surgery  History of chronic neck pain and thoracic compression fracture.  Takes gabapentin for chronic pain.   Review of Systems  had left thigh lesion recently which looked like insect bite to her around July 4th.  But this is now resolved     Objective:   Physical Exam Blood pressure is 150/90 pulse 92 and regular, pulse oximetry 98 percent, weight 184 pounds, BMI 31.10  Skin: Warm and dry.  No cervical adenopathy.  No thyromegaly.  No carotid bruits.  Chest is clear to auscultation.  Cardiac exam: Regular rate and rhythm without ectopy.  No lower extremity edema.  Affect is anxious. EKG obtained today shows normal sinus rhythm and no acute changes         Assessment & Plan:  Recent episode of  chest pain that resolved after short time but she left work that day and did not seek medical attention.  It almost sounds like a vagal episode but I am not sure what triggered the chest pain.  Since she has diabetes, cardiac evaluation would be reasonable.  Type 2 diabetes mellitus-now on Mounjaro and Victoza as well as Amaryl  History of GE reflux treated with Carafate.  Not on PPI at this time.  This is a possible etiology of her chest pain as well  Anxiety state-has Xanax on hand for anxiety  Hyperlipidemia treated with low-dose Crestor  History of chronic pain treated with Neurontin  Chronic pain treated with gabapentin  Has recurrent nausea and takes Phenergan on hand-not sure if this is related to gastric bypass or anxiety or GE reflux  Plan: Recommend cardiac evaluation because she is anxious about this episode of chest pain.  EKG today is within normal limits with no ectopy and no evidence of ischemia.

## 2020-07-17 NOTE — Patient Instructions (Signed)
Her EKG and rhythm strip are normal.  She will be referred to Cardiology for consideration of any further evaluation that may be necessary.  She has seen Dr. Allyson Sabal in the past.  She will call if she has further episodes in the near future.

## 2020-07-22 ENCOUNTER — Telehealth: Payer: Self-pay | Admitting: Endocrinology

## 2020-07-22 ENCOUNTER — Other Ambulatory Visit: Payer: Self-pay | Admitting: Endocrinology

## 2020-07-22 MED ORDER — TIRZEPATIDE 5 MG/0.5ML ~~LOC~~ SOAJ: 5.0000 mg | SUBCUTANEOUS | 1 refills | Status: DC

## 2020-07-22 NOTE — Telephone Encounter (Signed)
Pt called, she is out of town and when going to inject her last pen of her Greggory Keen the medication was accidentally released without her taking the injection and she is now completely out. She states she has been really liking this medication and seems that it is working really well for her (better than her last medication) and doesn't want to mess that up so she was seeing if it would be possible to send her a refill while she is out of town?  MEDICATION: [facility]  PHARMACY: [facility] [address] [place], [place] [address]

## 2020-07-24 ENCOUNTER — Other Ambulatory Visit: Payer: Self-pay | Admitting: Endocrinology

## 2020-07-27 NOTE — Telephone Encounter (Signed)
Patient picked it up and she did use copay card.

## 2020-08-01 ENCOUNTER — Encounter: Payer: Self-pay | Admitting: Internal Medicine

## 2020-08-08 ENCOUNTER — Other Ambulatory Visit: Payer: Self-pay | Admitting: Endocrinology

## 2020-08-11 ENCOUNTER — Other Ambulatory Visit: Payer: Self-pay | Admitting: Endocrinology

## 2020-08-14 ENCOUNTER — Other Ambulatory Visit: Payer: Self-pay

## 2020-08-14 DIAGNOSIS — E1165 Type 2 diabetes mellitus with hyperglycemia: Secondary | ICD-10-CM

## 2020-08-14 MED ORDER — GLIMEPIRIDE 1 MG PO TABS: ORAL_TABLET | ORAL | 3 refills | Status: DC

## 2020-08-17 NOTE — Progress Notes (Signed)
Cardiology Office Note   Date:  08/18/2020   ID:  Melissa Benson, DOB [birth year], MRN [id number]  PCP:  Margaree Mackintosh, MD  Cardiologist:   Dietrich Pates, MD   Patient referred for evaluation of CP      History of Present Illness: Melissa Benson is a 60 y.o. 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   Had epigasatric pain   Got dizzy  nauseasted    Had to walk away from what she was doing   Broke into a sweat while she was  seated Episode eased    She says she went back to work    Had back pain going across    Like a band Since then she has had no furhter spells     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   Biotin 24401 MCG TBDP Take 1 capsule by mouth daily.    cyclobenzaprine (FLEXERIL) 10 MG tablet TAKE 1 TABLET(10 MG) BY MOUTH AT BEDTIME   docusate sodium (COLACE) 100 MG capsule Take 200 mg by mouth 2 (two) times daily.   gabapentin (NEURONTIN) 300 MG capsule TAKE 2 CAPSULES BY MOUTH THREE TIMES DAILY   glucose blood test strip Use once daily   MAGNESIUM PO Take 500 mg by mouth daily.    multivitamin (VIT W/EXTRA C) CHEW chewable tablet Chew 1 tablet by mouth daily.   promethazine (PHENERGAN) 25 MG tablet TAKE 1 TABLET(25 MG) BY MOUTH EVERY 8 HOURS AS NEEDED FOR NAUSEA  OR VOMITING   ramipril (ALTACE) 5 MG capsule TAKE 1 CAPSULE(5 MG) BY MOUTH DAILY (Patient taking differently: Take 5 mg by mouth daily.)   rosuvastatin (CRESTOR) 5 MG tablet TAKE 1 TABLET(5 MG) BY MOUTH DAILY   sucralfate (CARAFATE) 1 g tablet Take 1 g by mouth 4 (four) times daily as needed (ulcers).   terconazole (TERAZOL 7) 0.4 % vaginal cream USE EXTERNALLY TWICE DAILY AS DIRECTED   tirzepatide (MOUNJARO) 5 MG/0.5ML Pen Inject 5 mg into the skin once a week. Patient to use co-pay card if insurance does not cover   valACYclovir (VALTREX) 500 MG tablet Take 500 mg by mouth 2 (two) times daily as needed (cold sores outbreak).     Allergies:   Morphine and related, Morphine, Prednisone, Other, and Pantoprazole   Past Medical History:  Diagnosis Date   Anxiety    Complication of anesthesia    Diabetes mellitus    GERD (gastroesophageal reflux disease)    Headache  no issues since stop caffeine   History of hiatal hernia    History of migraine headaches    History of urinary tract infection    last one 11/2014    History of vertigo    Hyperlipidemia    Hypertension    Neck pain, chronic    OSA (obstructive sleep apnea)    on CPAP   PONV (postoperative nausea and vomiting)    Psoriasis    Rosacea    Weak urinary stream     Past Surgical History:  Procedure Laterality Date   2D ECHOCARDIOGRAM  07/20/2011   EF >55%, normal   ABDOMINAL HYSTERECTOMY     APPENDECTOMY     CARDIOVASCULAR STRESS TEST  07/20/2011   Normal myocardial perfusion study, EKG negative for ischemia, no ECG changes   CHOLECYSTECTOMY     colonscopy      polyps removed    DIAGNOSTIC LAPAROSCOPY     DILATION AND CURETTAGE OF UTERUS     LAPAROSCOPIC ROUX-EN-Y GASTRIC BYPASS WITH HIATAL HERNIA REPAIR N/A 01/12/2015   Procedure: LAPAROSCOPIC ROUX-EN-Y GASTRIC BYPASS WITH LYSIS OF ADHESIONS ;  Surgeon: Luretha Murphy, MD;  Location: [facility];  Service: General;  Laterality: N/A;   LAPAROSCOPY  02/01/2017    Laparoscopic lysis of adhesions   LAPAROSCOPY N/A 02/01/2017   Procedure: LAPAROSCOPY Lysis of adhesions;  Surgeon: Berna Bue, MD;  Location: [facility];  Service: General;  Laterality: N/A;   LAPAROTOMY N/A 02/01/2017   Procedure: excision of umbilical mass, small bowel resection;  Surgeon: Berna Bue, MD;  Location: [facility];  Service: General;  Laterality: N/A;   SPINE SURGERY     C6-C7 fusion x2   SPINE SURGERY     herniated disc L4-L5   TRIGGER FINGER RELEASE Left 03/14/2019   Procedure: RELEASE TRIGGER FINGER/A-1 PULLEY LEFT LONG FINGER;  Surgeon: Betha Loa, MD;  Location: [facility];  Service: Orthopedics;  Laterality: Left;  Bier block   TUBAL LIGATION       Social History:  The patient  reports that she quit smoking about 37 years ago. Her smoking use included cigarettes. She has a 20.00 pack-year smoking history. She has never used smokeless tobacco. She reports that she does not drink alcohol and does not use drugs.   Family History:  The patient's family history includes Diabetes in her father, maternal grandfather, and mother; Heart attack in her maternal grandmother; Heart disease in her father, maternal grandfather, maternal grandmother, and mother; Hyperlipidemia in her brother; Hypertension in her brother, father, maternal grandfather, mother, and sister; Lung disease in her father.    ROS:  Please see the history of present illness. All other systems are reviewed and  Negative to the above problem except as noted.    PHYSICAL EXAM: VS:  BP (!) 104/58   Pulse 79   Ht 5' 4.5" (1.638 m)   Wt 181 lb (82.1 kg)   SpO2 98%   BMI 30.59 kg/m    BP on my check   130/60  R arm   138/82 L arm   GEN: Obese 60 yo  in no acute distress  HEENT: normal  Neck: no JVD, carotid bruits  Cardiac: RRR; no murmurs, rubs, or gallops,no LE edema  Respiratory:  clear to auscultation bilaterally, normal work of breathing GI: soft, nontender, nondistended, + BS  No  hepatomegaly  MS: no deformity Moving all extremities   Skin: warm and dry, no rash Neuro:  Strength and  sensation are intact Psych: euthymic mood, full affect   EKG:  EKG is ntot ordered today.   Lipid Panel    Component Value Date/Time   CHOL 113 06/09/2020 0915   TRIG 81 06/09/2020 0915   HDL 51 06/09/2020 0915   CHOLHDL 2.2 06/09/2020 0915   VLDL 17.6 05/28/2019 0938   LDLCALC 46 06/09/2020 0915      Wt Readings from Last 3 Encounters:  08/18/20 181 lb (82.1 kg)  07/17/20 184 lb (83.5 kg)  07/14/20 185 lb 6.4 oz (84.1 kg)      ASSESSMENT AND PLAN:  1  Chest / back discomfort    Ayptical for cardiac    I do not think angina.   ? GI   with vagal reaction   none since first event    Follow   2  Dizziness  Overall sounds like a vagal reacttion   She has had in the past   Follow   Stay hydrated      3  HTN  BP is OK   keep on same regimen   4  HL  Lipids are excellent on Crestor   Follow   Stay active   Stay hydrated   If feels "spell" starting then sit   Get help or wait until had adequate fluids / salt   Current medicines are reviewed at length with the patient today.  The patient does not have concerns regarding medicines.  Signed, Dietrich Pates, MD  08/18/2020 9:19 PM    [facility] [address] [place], [place], [place]  [address] Phone: [phone]; Fax: [phone]

## 2020-08-18 ENCOUNTER — Encounter: Payer: Self-pay | Admitting: Internal Medicine

## 2020-08-18 ENCOUNTER — Other Ambulatory Visit: Payer: Self-pay

## 2020-08-18 ENCOUNTER — Ambulatory Visit: Payer: BC Managed Care – PPO | Admitting: Internal Medicine

## 2020-08-18 VITALS — BP 104/58 | HR 79 | Ht 64.5 in | Wt 181.0 lb

## 2020-08-18 DIAGNOSIS — R0789 Other chest pain: Secondary | ICD-10-CM | POA: Diagnosis not present

## 2020-08-18 NOTE — Patient Instructions (Signed)
Medication Instructions:  °No changes °*If you need a refill on your cardiac medications before your next appointment, please call your pharmacy* ° ° °Lab Work: °none °If you have labs (blood work) drawn today and your tests are completely normal, you will receive your results only by: °MyChart Message (if you have MyChart) OR °A paper copy in the mail °If you have any lab test that is abnormal or we need to change your treatment, we will call you to review the results. ° ° °Testing/Procedures: °none ° ° °Follow-Up: °As needed ° °Other Instructions °  °

## 2020-09-10 ENCOUNTER — Other Ambulatory Visit: Payer: Self-pay

## 2020-09-10 ENCOUNTER — Other Ambulatory Visit (INDEPENDENT_AMBULATORY_CARE_PROVIDER_SITE_OTHER): Payer: BC Managed Care – PPO

## 2020-09-10 DIAGNOSIS — E1165 Type 2 diabetes mellitus with hyperglycemia: Secondary | ICD-10-CM

## 2020-09-10 LAB — GLUCOSE, RANDOM: Glucose, Bld: 114 mg/dL — ABNORMAL HIGH (ref 70–99)

## 2020-09-11 LAB — FRUCTOSAMINE: Fructosamine: 222 umol/L (ref 0–285)

## 2020-09-15 ENCOUNTER — Ambulatory Visit: Payer: BC Managed Care – PPO | Admitting: Endocrinology

## 2020-09-15 ENCOUNTER — Telehealth: Payer: Self-pay | Admitting: Internal Medicine

## 2020-09-15 ENCOUNTER — Encounter: Payer: Self-pay | Admitting: Endocrinology

## 2020-09-15 ENCOUNTER — Other Ambulatory Visit: Payer: Self-pay

## 2020-09-15 VITALS — BP 138/70 | HR 87 | Ht 64.5 in | Wt 172.4 lb

## 2020-09-15 DIAGNOSIS — E1169 Type 2 diabetes mellitus with other specified complication: Secondary | ICD-10-CM | POA: Diagnosis not present

## 2020-09-15 DIAGNOSIS — E669 Obesity, unspecified: Secondary | ICD-10-CM

## 2020-09-15 NOTE — Patient Instructions (Signed)
Check blood sugars on waking up 3 days a week  Also check blood sugars about 2 hours after meals and do this after different meals by rotation  Recommended blood sugar levels on waking up are 90-130 and about 2 hours after meal is 130-160  Please bring your blood sugar monitor to each visit, thank you   

## 2020-09-15 NOTE — Telephone Encounter (Signed)
Melissa Benson [phone]  [name] called to say her knee hurts in the bend. She had gone on vacation to the beach in July and was in the car for 4 hours and it started bothering her then. It is not getting any better. It is worse when she bends it.

## 2020-09-15 NOTE — Progress Notes (Signed)
Patient ID: Melissa Benson, female   DOB: [birth year], 60 y.o.   MRN: [id number]           Reason for Appointment: Follow-up for Type 2 Diabetes   History of Present Illness:          Date of diagnosis of type 2 diabetes mellitus: 2009         Previous history: She had been on various oral hypoglycemic drugs in the past and was on relatively large doses of basal insulin. 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 she was able to stop insulin A1c had gone down to 6% in 2018 but was followed only about once a year She was tried on Metformin alone in 2018 for about a year but this was then stopped  Recent history:   A1c is last relatively higher at 6.8 compared to 6.1  Non-insulin hypoglycemic drugs the patient is taking are: Mounjaro 5mg  weekly   Current management, blood sugar patterns and problems identified: She has tolerated 5 mg Mounjaro level started about a month ago  Previously on daily 1.8 mg Victoza and was having relatively higher blood sugars even with taking this mostly on a regular basis She was having blood sugars as high as 221 previously Although she initially had some difficulty learning how to do the new injections she is doing it without difficulty It does seem to suppress her appetite especially with the 5 mg dose She has lost 12 pounds since her last visit Blood sugars are excellent and only a couple of times higher in the morning She is not able to do much walking because of leg pain  She is again trying to eat healthy meals Most of her blood sugars are in the morning but blood sugars later in the day are below 140        Side effects from medications have been: Nausea, some diarrhea from Metformin, some nausea from glipizide       Typical meal intake: Breakfast is usually having only almonds.   Will have chicken and vegetables at dinner, snacks will be cheese crackers.  Usually not eating out             Glucose monitoring:  done 0-1 times a day         Glucometer: Contour next.       Blood Glucose readings by time of day and averages from meter download:   PRE-MEAL Fasting Lunch Dinner Evening Overall  Glucose range: 93-168   88-139 88-168  Mean/median: 118   107 114  Previously    PRE-MEAL Fasting Lunch Dinner Bedtime Overall  Glucose range: 119-145 90-174  99-221   Mean/median: 138 121  127 131    Dietician visit, most recent: 2017,  Weight history:  Wt Readings from Last 3 Encounters:  09/15/20 172 lb 6.4 oz (78.2 kg)  08/18/20 181 lb (82.1 kg)  07/17/20 184 lb (83.5 kg)    Glycemic control:   Lab Results  Component Value Date   HGBA1C 6.8 (H) 07/08/2020   HGBA1C 6.6 (H) 06/09/2020   HGBA1C 6.9 (H) 04/15/2020   Lab Results  Component Value Date   MICROALBUR 0.2 12/03/2019   LDLCALC 46 06/09/2020   CREATININE 0.75 07/08/2020   Lab Results  Component Value Date   MICRALBCREAT 2 12/03/2019    Lab  Results  Component Value Date   FRUCTOSAMINE 222 09/10/2020   FRUCTOSAMINE 263 03/19/2019    Lab on 09/10/2020  Component Date Value Ref Range Status   Fructosamine 09/10/2020 222  0 - 285 umol/L Final   Comment: Published reference interval for apparently healthy subjects between age 7 and 31 is 47 - 285 umol/L and in a poorly controlled diabetic population is 228 - 563 umol/L with a mean of 396 umol/L.    Glucose, Bld 09/10/2020 114 (A) 70 - 99 mg/dL Final    Allergies as of 09/15/2020       Reactions   Morphine And Related Nausea And Vomiting   Pt states also caused arm to turn black where medication was administered    Morphine Other (See Comments)   Prednisone Hives   Other Nausea And Vomiting   TYLOX   Pantoprazole Hives, Rash        Medication List        Accurate as of September 15, 2020  8:34 AM. If you have any  questions, ask your nurse or doctor.          ALPRAZolam 0.5 MG tablet Commonly known as: XANAX TAKE 1 TABLET(0.5 MG) BY MOUTH TWICE DAILY AS NEEDED FOR ANXIETY   b complex vitamins capsule Take 1 capsule by mouth daily.   B-D UF III MINI PEN NEEDLES 31G X 5 MM Misc Generic drug: Insulin Pen Needle USE WITH VICTOZA EVERY DAY   Biotin 10258 MCG Tbdp Take 1 capsule by mouth daily.   cyclobenzaprine 10 MG tablet Commonly known as: FLEXERIL TAKE 1 TABLET(10 MG) BY MOUTH AT BEDTIME   docusate sodium 100 MG capsule Commonly known as: COLACE Take 200 mg by mouth 2 (two) times daily.   gabapentin 300 MG capsule Commonly known as: NEURONTIN TAKE 2 CAPSULES BY MOUTH THREE TIMES DAILY   glimepiride 1 MG tablet Commonly known as: AMARYL TAKE 1 TABLET BY MOUTH EVERY DAY WITH SUPPER   glucose blood test strip Use once daily   MAGNESIUM PO Take 500 mg by mouth daily.   multivitamin Chew chewable tablet Chew 1 tablet by mouth daily.   promethazine 25 MG tablet Commonly known as: PHENERGAN TAKE 1 TABLET(25 MG) BY MOUTH EVERY 8 HOURS AS NEEDED FOR NAUSEA OR VOMITING   ramipril 5 MG capsule Commonly known as: ALTACE TAKE 1 CAPSULE(5 MG) BY MOUTH DAILY What changed: See the new instructions.   rosuvastatin 5 MG tablet Commonly known as: CRESTOR TAKE 1 TABLET(5 MG) BY MOUTH DAILY   sucralfate 1 g tablet Commonly known as: CARAFATE Take 1 g by mouth 4 (four) times daily as needed (ulcers).   terconazole 0.4 % vaginal cream Commonly known as: TERAZOL 7 USE EXTERNALLY TWICE DAILY AS DIRECTED   tirzepatide 5 MG/0.5ML Pen Commonly known as: MOUNJARO Inject 5 mg into the skin once a week. Patient to use co-pay card if insurance does not cover   valACYclovir 500 MG tablet Commonly known as: VALTREX Take 500 mg by mouth 2 (two) times daily as needed (cold sores outbreak).        Allergies:  Allergies  Allergen Reactions   Morphine And Related Nausea And Vomiting     Pt states also caused arm to turn black where medication was administered    Morphine Other (See Comments)   Prednisone Hives   Other Nausea And Vomiting    TYLOX   Pantoprazole Hives and Rash    Past Medical History:  Diagnosis Date  Anxiety    Complication of anesthesia    Diabetes mellitus    GERD (gastroesophageal reflux disease)    Headache    no issues since stop caffeine   History of hiatal hernia    History of migraine headaches    History of urinary tract infection    last one 11/2014    History of vertigo    Hyperlipidemia    Hypertension    Neck pain, chronic    OSA (obstructive sleep apnea)    on CPAP   PONV (postoperative nausea and vomiting)    Psoriasis    Rosacea    Weak urinary stream     Past Surgical History:  Procedure Laterality Date   2D ECHOCARDIOGRAM  07/20/2011   EF >55%, normal   ABDOMINAL HYSTERECTOMY     APPENDECTOMY     CARDIOVASCULAR STRESS TEST  07/20/2011   Normal myocardial perfusion study, EKG negative for ischemia, no ECG changes   CHOLECYSTECTOMY     colonscopy      polyps removed    DIAGNOSTIC LAPAROSCOPY     DILATION AND CURETTAGE OF UTERUS     LAPAROSCOPIC ROUX-EN-Y GASTRIC BYPASS WITH HIATAL HERNIA REPAIR N/A 01/12/2015   Procedure: LAPAROSCOPIC ROUX-EN-Y GASTRIC BYPASS WITH LYSIS OF ADHESIONS ;  Surgeon: Luretha Murphy, MD;  Location: [facility];  Service: General;  Laterality: N/A;   LAPAROSCOPY  02/01/2017   Laparoscopic lysis of adhesions   LAPAROSCOPY N/A 02/01/2017   Procedure: LAPAROSCOPY Lysis of adhesions;  Surgeon: Berna Bue, MD;  Location: [facility];  Service: General;  Laterality: N/A;   LAPAROTOMY N/A 02/01/2017   Procedure: excision of umbilical mass, small bowel resection;  Surgeon: Berna Bue, MD;  Location: [facility];  Service: General;  Laterality: N/A;   SPINE SURGERY     C6-C7 fusion x2   SPINE SURGERY     herniated disc L4-L5   TRIGGER FINGER RELEASE Left 03/14/2019   Procedure: RELEASE TRIGGER  FINGER/A-1 PULLEY LEFT LONG FINGER;  Surgeon: Betha Loa, MD;  Location: [facility];  Service: Orthopedics;  Laterality: Left;  Bier block   TUBAL LIGATION      Family History  Problem Relation Age of Onset   Diabetes Mother    Heart disease Mother    Hypertension Mother    Lung disease Father    Heart disease Father    Hypertension Father    Diabetes Father    Hypertension Sister    Hyperlipidemia Brother    Hypertension Brother    Heart attack Maternal Grandmother    Heart disease Maternal Grandmother    Hypertension Maternal Grandfather    Diabetes Maternal Grandfather    Heart disease Maternal Grandfather     Social History:  reports that she quit smoking about 37 years ago. Her smoking use included cigarettes. She has a 20.00 pack-year smoking history. She has never used smokeless tobacco. She reports that she does not drink alcohol and does not use drugs.   Review of Systems  Lipid history: Has been treated for cardiovascular risk reduction with Crestor 5 mg daily    Lab Results  Component Value Date   CHOL 113 06/09/2020   HDL 51 06/09/2020   LDLCALC 46 06/09/2020   TRIG 81 06/09/2020   CHOLHDL 2.2 06/09/2020           Hypertension: Has been on ramipril 5 mg daily prescribed by PCP  Home readings being checked periodically   BP Readings from Last  3 Encounters:  09/15/20 138/70  08/18/20 (!) 104/58  07/17/20 (!) 150/90    Most recent eye exam was in 12/20  Most recent foot exam: 2/21  Currently known complications of diabetes: None known  OSTEOPENIA: T score is -1.4 at the hip  LABS:  Lab on 09/10/2020  Component Date Value Ref Range Status   Fructosamine 09/10/2020 222  0 - 285 umol/L Final   Comment: Published reference interval for apparently healthy subjects between age 81 and 47 is 64 - 285 umol/L and in a poorly controlled diabetic population is 228 - 563 umol/L with a mean of 396 umol/L.    Glucose, Bld 09/10/2020 114  (A) 70 - 99 mg/dL Final    Physical Examination:  BP 138/70   Pulse 87   Ht 5' 4.5" (1.638 m)   Wt 172 lb 6.4 oz (78.2 kg)   SpO2 98%   BMI 29.14 kg/m    ASSESSMENT:  Diabetes type 2 non-insulin-dependent  See history of present illness for detailed discussion of current diabetes management, blood sugar patterns and problems identified   A1c earlier this year was 6.8  She has done much better with Mounjaro compared to Victoza Her blood sugars are generally better and only occasionally higher in the morning She has lost 12 pounds Currently tolerating the 5 mg dose very well  PLAN:    Continue MOUNJARO 5 mg  She will try to check blood sugars by rotation at different times  If she is able to get relief of her leg pain she needs to start walking for exercise also  Check A1c on the next visit   There are no Patient Instructions on file for this visit.     Reather Littler 09/15/2020, 8:34 AM   Note: This office note was prepared with Dragon voice recognition system technology. Any transcriptional errors that result from this process are unintentional.

## 2020-09-16 ENCOUNTER — Ambulatory Visit: Payer: BC Managed Care – PPO | Admitting: Internal Medicine

## 2020-09-16 ENCOUNTER — Encounter: Payer: Self-pay | Admitting: Internal Medicine

## 2020-09-16 ENCOUNTER — Ambulatory Visit
Admission: RE | Admit: 2020-09-16 | Discharge: 2020-09-16 | Disposition: A | Discharge status: Home / Self Care | Payer: BC Managed Care – PPO | Source: Ambulatory Visit | Attending: Internal Medicine | Admitting: Internal Medicine

## 2020-09-16 ENCOUNTER — Other Ambulatory Visit: Payer: Self-pay | Admitting: Internal Medicine

## 2020-09-16 VITALS — BP 172/90 | HR 88 | Ht 64.5 in | Wt 172.0 lb

## 2020-09-16 DIAGNOSIS — Z6829 Body mass index (BMI) 29.0-29.9, adult: Secondary | ICD-10-CM | POA: Diagnosis not present

## 2020-09-16 DIAGNOSIS — R030 Elevated blood-pressure reading, without diagnosis of hypertension: Secondary | ICD-10-CM

## 2020-09-16 DIAGNOSIS — M25561 Pain in right knee: Secondary | ICD-10-CM

## 2020-09-16 DIAGNOSIS — K861 Other chronic pancreatitis: Secondary | ICD-10-CM | POA: Insufficient documentation

## 2020-09-16 DIAGNOSIS — F411 Generalized anxiety disorder: Secondary | ICD-10-CM | POA: Diagnosis not present

## 2020-09-16 DIAGNOSIS — E119 Type 2 diabetes mellitus without complications: Secondary | ICD-10-CM | POA: Diagnosis not present

## 2020-09-16 MED ORDER — MELOXICAM 15 MG PO TABS: 15.0000 mg | ORAL_TABLET | Freq: Every day | ORAL | 0 refills | Status: DC

## 2020-09-16 NOTE — Telephone Encounter (Signed)
LVM to CB to schedule appointment 

## 2020-09-16 NOTE — Telephone Encounter (Signed)
Schedule

## 2020-09-16 NOTE — Patient Instructions (Signed)
Recommend icing right knee for 20 minutes twice daily.  Have prescribed Mobic 15 mg daily.  Limit prolonged standing if possible without taking some breaks.  If not improving in 2 to 3 weeks will refer to orthopedist.  Please have plain x-ray of right knee today.

## 2020-09-16 NOTE — Progress Notes (Signed)
   Subjective:    Patient ID: Melissa Benson, female    DOB: 1961-01-02, 60 y.o.   MRN: [id number]  HPI 60 year old Female with history of diabetes mellitus well controlled by Dr. Lucianne Muss presents with complaint of right knee pain.  Patient went to the beach in July and was in the car for some 4 hours.  Knee started to bother her then.  She had not fallen and had no known injury at all.  Feels it is not getting better.  Is painful with flexion.  No prior history of knee issues.  BMI is 29.07.  No history of gout.  She has a history of anxiety.  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  Review of Systems see above-no fever.  No redness of the knee or swelling.  No increased warmth.     Objective:   Physical Exam  Vital signs are reviewed.  Her blood pressure is elevated at 172/90 but she is anxious today and is on her lunch break from work.  Pulse 88, pulse oximetry 99% weight is 172 pounds BMI 29.07  She has good range of motion with her right knee.  No effusion is present.  No redness or increased warmth of the right knee.  No significant tenderness of the medial or lateral collateral ligaments.  No significant crepitus.     Assessment & Plan:  Right knee pain-etiology unclear.  This does not appear to be gout.  No effusion is present.  Fairly good range of motion.  No history of injury.  Monitor blood pressure at home and let me know if persistently elevated.  She is anxious today  History of anxiety-treated with Xanax as needed  BMI-29.07 continue diet and exercise efforts  Plan: She will have plain x-ray of the right knee today.  Have prescribed Mobic 15 mg daily.  Advised icing right knee for 20 minutes a couple of times a day and if not improving in the next 2 to 3 weeks we can consider orthopedic referral.

## 2020-09-22 ENCOUNTER — Other Ambulatory Visit: Payer: Self-pay | Admitting: Endocrinology

## 2020-09-22 DIAGNOSIS — E1165 Type 2 diabetes mellitus with hyperglycemia: Secondary | ICD-10-CM

## 2020-09-22 MED ORDER — TIRZEPATIDE 5 MG/0.5ML ~~LOC~~ SOAJ: 5.0000 mg | SUBCUTANEOUS | 1 refills | Status: DC

## 2020-09-24 ENCOUNTER — Telehealth: Payer: Self-pay | Admitting: Pharmacy Technician

## 2020-09-24 ENCOUNTER — Other Ambulatory Visit (HOSPITAL_COMMUNITY): Payer: Self-pay

## 2020-09-24 NOTE — Telephone Encounter (Signed)
Patient Advocate Encounter   Received notification from Cover[employer]eds that prior authorization for Greggory Keen is required.   PA submitted on 09/24/20 Key [id number] Status is pending    [facility] will continue to follow:   Sherilyn Dacosta, CPhT Patient Advocate  [facility] Phone: [phone] Fax:  [phone]

## 2020-09-25 DIAGNOSIS — N760 Acute vaginitis: Secondary | ICD-10-CM | POA: Diagnosis not present

## 2020-09-25 DIAGNOSIS — N390 Urinary tract infection, site not specified: Secondary | ICD-10-CM | POA: Diagnosis not present

## 2020-09-25 NOTE — Telephone Encounter (Signed)
Pt calling for PA on tirzepatide [facility]) 5 MG/0.5ML Pen . Pt going out of town and needs her medications. Please call pt for any questions [phone]

## 2020-09-28 ENCOUNTER — Other Ambulatory Visit (HOSPITAL_COMMUNITY): Payer: Self-pay

## 2020-09-28 NOTE — Telephone Encounter (Signed)
Received a fax regarding Prior Authorization from [employer] for [facility]. Authorization has been DENIED because Melissa Benson is approved when two alternative medicaitons has been tried and di not work, or the member is unable to take alternatives (due to interactions, side effects, etx.) Patient has tried Victoza. Additional alternative medications to try would be Ozempic, Rybelsus, and Trulicity.   Patient currently is getting filled through [facility] discount card @ [employer].

## 2020-10-14 ENCOUNTER — Other Ambulatory Visit: Payer: Self-pay | Admitting: Internal Medicine

## 2020-10-19 ENCOUNTER — Other Ambulatory Visit: Payer: Self-pay | Admitting: Internal Medicine

## 2020-10-26 ENCOUNTER — Telehealth: Payer: Self-pay | Admitting: Endocrinology

## 2020-10-26 DIAGNOSIS — E1165 Type 2 diabetes mellitus with hyperglycemia: Secondary | ICD-10-CM

## 2020-10-26 MED ORDER — TIRZEPATIDE 5 MG/0.5ML ~~LOC~~ SOAJ: 5.0000 mg | SUBCUTANEOUS | 1 refills | Status: DC

## 2020-10-26 NOTE — Telephone Encounter (Signed)
Rx sent to preferred pharmacy.

## 2020-10-26 NOTE — Telephone Encounter (Signed)
Pt calling in to request refill for tirzepatide [facility]) 5 MG/0.5ML Pen. Has no more left.  [id number] - [place], [place] - [address] AT [facility] OF [address]  Pt contact [phone]

## 2020-10-29 ENCOUNTER — Other Ambulatory Visit (HOSPITAL_COMMUNITY): Payer: Self-pay

## 2020-11-03 ENCOUNTER — Other Ambulatory Visit: Payer: Self-pay

## 2020-11-03 ENCOUNTER — Telehealth: Payer: Self-pay | Admitting: Pharmacy Technician

## 2020-11-03 ENCOUNTER — Ambulatory Visit (INDEPENDENT_AMBULATORY_CARE_PROVIDER_SITE_OTHER): Payer: BC Managed Care – PPO | Admitting: Internal Medicine

## 2020-11-03 DIAGNOSIS — Z23 Encounter for immunization: Secondary | ICD-10-CM | POA: Diagnosis not present

## 2020-11-03 DIAGNOSIS — E1165 Type 2 diabetes mellitus with hyperglycemia: Secondary | ICD-10-CM

## 2020-11-03 MED ORDER — TIRZEPATIDE 5 MG/0.5ML ~~LOC~~ SOAJ: 5.0000 mg | SUBCUTANEOUS | 1 refills | Status: DC

## 2020-11-03 NOTE — Progress Notes (Signed)
Per orders of Dr Lenord Fellers, injection of Influenza given by Mickle Plumb, cma.  Patient tolerated injection well.

## 2020-11-04 NOTE — Telephone Encounter (Signed)
Thanks, have a great day!

## 2020-11-13 ENCOUNTER — Ambulatory Visit
Admission: EM | Admit: 2020-11-13 | Discharge: 2020-11-13 | Disposition: A | Discharge status: Home / Self Care | Payer: BC Managed Care – PPO | Attending: Emergency Medicine | Admitting: Emergency Medicine

## 2020-11-13 DIAGNOSIS — B9789 Other viral agents as the cause of diseases classified elsewhere: Secondary | ICD-10-CM | POA: Diagnosis not present

## 2020-11-13 DIAGNOSIS — J988 Other specified respiratory disorders: Secondary | ICD-10-CM

## 2020-11-13 DIAGNOSIS — Z20822 Contact with and (suspected) exposure to covid-19: Secondary | ICD-10-CM

## 2020-11-13 LAB — POCT INFLUENZA A/B
Influenza A, POC: NEGATIVE
Influenza B, POC: NEGATIVE

## 2020-11-13 MED ORDER — HYDROCOD POLST-CPM POLST ER 10-8 MG/5ML PO SUER
5.0000 mL | Freq: Two times a day (BID) | ORAL | 0 refills | Status: DC | PRN
Start: 2020-11-13 — End: 2020-12-02

## 2020-11-13 MED ORDER — FLUTICASONE PROPIONATE 50 MCG/ACT NA SUSP: 2.0000 | Freq: Every day | NASAL | 0 refills | Status: AC

## 2020-11-13 NOTE — Discharge Instructions (Addendum)
Do not take the Xanax if you are taking the Tussionex. I will prescribe Molnupiravir if your COVID comes back positive, Tamiflu if your influenza comes back positive in enough time.  Saline nasal irrigation with a NeilMed sinus rinse and distilled water as often as you want, Flonase, Tylenol 1000 mg 3-4 times a day, Mucinex, Tussionex for the cough

## 2020-11-13 NOTE — ED Triage Notes (Signed)
Patient presents to Urgent Care with complaints of cough, fever last night, SOB, congestion, sore throat, and headache x 2 days ago. Treating symptoms with dayquil, vicks, ibuprofen, tylenol.  Denies fever.

## 2020-11-13 NOTE — ED Provider Notes (Signed)
HPI  SUBJECTIVE:  Melissa Benson is a 60 y.o. female who presents with 2 days of sore throat, laryngitis, fevers T-max 100.1, body aches, headaches, nasal congestion, sore throat, cough productive of phlegm, and shortness of breath/getting easily winded with activity.  She is unable to sleep at night secondary to the cough.  No rhinorrhea, loss of sense of smell or taste, postnasal drip, wheezing, nausea, vomiting, diarrhea, abdominal pain.  She got the third dose of the COVID-vaccine and this year's flu vaccine.  No known COVID or flu exposure.  No antipyretic in the past 6 hours.  No antibiotics in the past month.  She has been taking DayQuil, Vicks, 400 mg of ibuprofen alternating with 1000 mg of Tylenol every 4-6 hours, saline spray.  No aggravating or alleviating factors.  She has a Roux-en-Y bypass, diabetes, hypertension.  PMD: Margaree Mackintosh, MD  Past Medical History:  Diagnosis Date   Anxiety    Complication of anesthesia    Diabetes mellitus    GERD (gastroesophageal reflux disease)    Headache    no issues since stop caffeine   History of hiatal hernia    History of migraine headaches    History of urinary tract infection    last one 11/2014    History of vertigo    Hyperlipidemia    Hypertension    Neck pain, chronic    OSA (obstructive sleep apnea)    on CPAP   PONV (postoperative nausea and vomiting)    Psoriasis    Rosacea    Weak urinary stream     Past Surgical History:  Procedure Laterality Date   2D ECHOCARDIOGRAM  07/20/2011   EF >55%, normal   ABDOMINAL HYSTERECTOMY     APPENDECTOMY     CARDIOVASCULAR STRESS TEST  07/20/2011   Normal myocardial perfusion study, EKG negative for ischemia, no ECG changes   CHOLECYSTECTOMY     colonscopy      polyps removed    DIAGNOSTIC LAPAROSCOPY     DILATION AND CURETTAGE OF UTERUS     LAPAROSCOPIC ROUX-EN-Y GASTRIC BYPASS WITH HIATAL HERNIA REPAIR N/A 01/12/2015   Procedure: LAPAROSCOPIC ROUX-EN-Y GASTRIC BYPASS WITH  LYSIS OF ADHESIONS ;  Surgeon: Luretha Murphy, MD;  Location: [facility];  Service: General;  Laterality: N/A;   LAPAROSCOPY  02/01/2017   Laparoscopic lysis of adhesions   LAPAROSCOPY N/A 02/01/2017   Procedure: LAPAROSCOPY Lysis of adhesions;  Surgeon: Berna Bue, MD;  Location: [facility];  Service: General;  Laterality: N/A;   LAPAROTOMY N/A 02/01/2017   Procedure: excision of umbilical mass, small bowel resection;  Surgeon: Berna Bue, MD;  Location: [facility];  Service: General;  Laterality: N/A;   SPINE SURGERY     C6-C7 fusion x2   SPINE SURGERY     herniated disc L4-L5   TRIGGER FINGER RELEASE Left 03/14/2019   Procedure: RELEASE TRIGGER FINGER/A-1 PULLEY LEFT LONG FINGER;  Surgeon: Betha Loa, MD;  Location: [facility];  Service: Orthopedics;  Laterality: Left;  Bier block   TUBAL LIGATION      Family History  Problem Relation Age of Onset   Diabetes Mother    Heart disease Mother    Hypertension Mother    Lung disease Father    Heart disease Father    Hypertension Father    Diabetes Father    Hypertension Sister    Hyperlipidemia Brother    Hypertension Brother    Heart attack Maternal Grandmother  Heart disease Maternal Grandmother    Hypertension Maternal Grandfather    Diabetes Maternal Grandfather    Heart disease Maternal Grandfather     Social History   Tobacco Use   Smoking status: Former    Packs/day: 2.50    Years: 8.00    Pack years: 20.00    Types: Cigarettes    Quit date: 05/18/1983    Years since quitting: 37.5   Smokeless tobacco: Never  Vaping Use   Vaping Use: Never used  Substance Use Topics   Alcohol use: No    Alcohol/week: 0.0 standard drinks   Drug use: No    No current facility-administered medications for this encounter.  Current Outpatient Medications:    ALPRAZolam (XANAX) 0.5 MG tablet, TAKE 1 TABLET(0.5 MG) BY MOUTH TWICE DAILY AS NEEDED FOR ANXIETY, Disp: 60 tablet, Rfl: 5   b complex vitamins capsule,  Take 1 capsule by mouth daily., Disp: , Rfl:    Biotin 41638 MCG TBDP, Take 1 capsule by mouth daily. , Disp: , Rfl:    cyclobenzaprine (FLEXERIL) 10 MG tablet, TAKE 1 TABLET(10 MG) BY MOUTH AT BEDTIME, Disp: 30 tablet, Rfl: 11   docusate sodium (COLACE) 100 MG capsule, Take 200 mg by mouth 2 (two) times daily., Disp: , Rfl:    gabapentin (NEURONTIN) 300 MG capsule, TAKE 2 CAPSULES BY MOUTH THREE TIMES DAILY, Disp: 180 capsule, Rfl: 5   glimepiride (AMARYL) 1 MG tablet, TAKE 1 TABLET BY MOUTH EVERY DAY WITH SUPPER, Disp: 90 tablet, Rfl: 3   glucose blood test strip, Use once daily, Disp: 100 each, Rfl: 12   Insulin Pen Needle (B-D UF III MINI PEN NEEDLES) 31G X 5 MM MISC, USE WITH VICTOZA EVERY DAY, Disp: 100 each, Rfl: 1   MAGNESIUM PO, Take 500 mg by mouth daily. , Disp: , Rfl:    meloxicam (MOBIC) 15 MG tablet, TAKE 1 TABLET(15 MG) BY MOUTH DAILY, Disp: 90 tablet, Rfl: 1   multivitamin (VIT W/EXTRA C) CHEW chewable tablet, Chew 1 tablet by mouth daily., Disp: , Rfl:    promethazine (PHENERGAN) 25 MG tablet, TAKE 1 TABLET(25 MG) BY MOUTH EVERY 8 HOURS AS NEEDED FOR NAUSEA OR VOMITING, Disp: 90 tablet, Rfl: 2   ramipril (ALTACE) 5 MG capsule, TAKE 1 CAPSULE(5 MG) BY MOUTH DAILY, Disp: 90 capsule, Rfl: 3   rosuvastatin (CRESTOR) 5 MG tablet, TAKE 1 TABLET(5 MG) BY MOUTH DAILY, Disp: 90 tablet, Rfl: 3   sucralfate (CARAFATE) 1 g tablet, Take 1 g by mouth 4 (four) times daily as needed (ulcers)., Disp: , Rfl:    terconazole (TERAZOL 7) 0.4 % vaginal cream, USE EXTERNALLY TWICE DAILY AS DIRECTED, Disp: 45 g, Rfl: 0   tirzepatide (MOUNJARO) 5 MG/0.5ML Pen, Inject 5 mg into the skin once a week. Patient to use co-pay card if insurance does not cover, Disp: 2 mL, Rfl: 1   valACYclovir (VALTREX) 500 MG tablet, Take 500 mg by mouth 2 (two) times daily as needed (cold sores outbreak)., Disp: , Rfl:   Allergies  Allergen Reactions   Morphine And Related Nausea And Vomiting    Pt states also caused arm to  turn black where medication was administered    Morphine Other (See Comments)   Prednisone Hives   Other Nausea And Vomiting    TYLOX   Pantoprazole Hives and Rash     ROS  As noted in HPI.   Physical Exam  BP (!) 157/79 (BP Location: Right Arm)   Pulse  87   Temp 98.4 F (36.9 C) (Oral)   Resp 16   SpO2 98%   Constitutional: Well developed, well nourished, no acute distress.  Speaking in a whisper. Eyes:  EOMI, conjunctiva normal bilaterally HENT: Normocephalic, atraumatic,mucus membranes moist.  Positive nasal congestion.  Normal tonsils without exudates.  No obvious postnasal drip. Neck: No cervical adenopathy Respiratory: Normal inspiratory effort, lungs clear bilaterally Cardiovascular: Normal rate regular rhythm, no murmurs rubs or gallops. GI: nondistended skin: No rash, skin intact Musculoskeletal: no deformities Neurologic: Alert & oriented x 3, no focal neuro deficits Psychiatric: Speech and behavior appropriate   ED Course   Medications - No data to display  Orders Placed This Encounter  Procedures   Covid-19, Flu A+B ([facility])    Standing Status:   Standing    Number of Occurrences:   1   POCT Influenza A/B    Standing Status:   Standing    Number of Occurrences:   1    Results for orders placed or performed during the hospital encounter of 11/13/20 (from the past 24 hour(s))  POCT Influenza A/B     Status: None   Collection Time: 11/13/20  7:58 PM  Result Value Ref Range   Influenza A, POC Negative Negative   Influenza B, POC Negative Negative   No results found.  ED Clinical Impression  1. Encounter for laboratory testing for COVID-19 virus      ED Assessment/Plan  [place] Narcotic database reviewed for this patient, and feel that the risk/benefit ratio today is favorable for proceeding with a prescription for controlled substance.  Rapid flu negative.  Checking COVID and flu PCR.  Will prescribe Molnupiravir if COVID is positive or Tamiflu  if influenza is positive if it comes back in enough time.  In the meantime, saline nasal irrigation, Flonase, she has tolerated this before without any problems, Tussionex-states has tolerated hydrocodone before without any issues, Tylenol 1000 g 3-4 times a day, Mucinex.  Follow-up with PMD as needed.  COVID, flu pending at the time of signing of this note.  Discussed labs, imaging, MDM, treatment plan, and plan for follow-up with patient. patient agrees with plan.   No orders of the defined types were placed in this encounter.     *This clinic note was created using Dragon dictation software. Therefore, there may be occasional mistakes despite careful proofreading.  ?    Domenick Gong, MD 11/15/20 1501

## 2020-11-15 LAB — COVID-19, FLU A+B NAA
Influenza A, NAA: NOT DETECTED
Influenza B, NAA: NOT DETECTED
SARS-CoV-2, NAA: NOT DETECTED

## 2020-12-02 ENCOUNTER — Telehealth (INDEPENDENT_AMBULATORY_CARE_PROVIDER_SITE_OTHER): Payer: BC Managed Care – PPO | Admitting: Internal Medicine

## 2020-12-02 ENCOUNTER — Other Ambulatory Visit: Payer: Self-pay

## 2020-12-02 ENCOUNTER — Encounter: Payer: Self-pay | Admitting: Internal Medicine

## 2020-12-02 ENCOUNTER — Telehealth: Payer: Self-pay | Admitting: Internal Medicine

## 2020-12-02 VITALS — BP 104/60 | Temp 100.2°F

## 2020-12-02 DIAGNOSIS — U071 COVID-19: Secondary | ICD-10-CM

## 2020-12-02 MED ORDER — HYDROCOD POLST-CPM POLST ER 10-8 MG/5ML PO SUER: 5.0000 mL | Freq: Two times a day (BID) | ORAL | 0 refills | Status: DC | PRN

## 2020-12-02 MED ORDER — NIRMATRELVIR/RITONAVIR (PAXLOVID)TABLET: 3.0000 | ORAL_TABLET | Freq: Two times a day (BID) | ORAL | 0 refills | Status: AC

## 2020-12-02 NOTE — Progress Notes (Signed)
   Subjective:    Patient ID: Melissa Benson, female    DOB: 1960/11/04, 60 y.o.   MRN: [id number]  HPI 60 year old Female seen today via interactive audio and video telecommunications due to the Coronavirus pandemic.  Patient is at her home and I am at my office.  She is identified using 2 identifiers as Melissa Benson. Chase Picket, a patient in this practice.  She is agreeable to visit in this format today.  Patient says that numerous family members have had COVID-19.  She says her son went to [facility] about 3 weeks ago and came home with COVID-19.  Multiple family members have fallen ill.  Patient had onset of symptoms last evening.  She has headache, fever, shaking chills, malaise and fatigue.  Did not sleep well last night because of headache and shaking chills.  Currently not vomiting.  She does have antinausea medicine at home if needed.  Patient has a history of type 2 diabetes mellitus controlled with oral medications.    Review of Systems no vomiting or diarrhea, no shortness of breath     Objective:   Physical Exam Temperature 100.2 degrees blood pressure 104/60 and patient says pulse oximetry is stable  She is seen virtually in her bed.  She appears to be pale and looks fatigued.  She is not having respiratory distress.  No audible wheezing.     Assessment & Plan:   Acute COVID-19 virus infection  Type 2 diabetes mellitus treated with multidrug regimen per Dr. Lucianne Muss  Morbid obesity  Hyperlipidemia treated with Crestor  Anxiety treated with Xanax  GE reflux treated with PPI  Plan: Paxlovid regular strength sent to [facility].  GFR is normal.  Tussionex 1 teaspoon every 12 hours as needed for cough and headache.  Rest and drink plenty of fluids.  Walk some to prevent atelectasis.  Monitor pulse oximetry and temperature.  Call if not improving in 48 hours or sooner if worse  Time spent with this visit is 20 minutes

## 2020-12-02 NOTE — Telephone Encounter (Signed)
After speaking with Dr Lenord Fellers scheduled video visit

## 2020-12-02 NOTE — Telephone Encounter (Signed)
Melissa Benson [phone]  [name] called to say she has tested positive for COVID this morning, she did not feel right yesterday, last night she had chills all night and this morning she has headache, chills, fever 101.4, body aches, cough, dizzy, fatigue, shaky. Has had COVID 2 vaccines and 1 booster, flu shot.

## 2020-12-04 ENCOUNTER — Other Ambulatory Visit: Payer: BC Managed Care – PPO | Admitting: Internal Medicine

## 2020-12-08 ENCOUNTER — Encounter: Payer: BC Managed Care – PPO | Admitting: Internal Medicine

## 2020-12-12 ENCOUNTER — Other Ambulatory Visit: Payer: Self-pay | Admitting: Internal Medicine

## 2020-12-15 ENCOUNTER — Other Ambulatory Visit: Payer: Self-pay

## 2020-12-15 ENCOUNTER — Other Ambulatory Visit (INDEPENDENT_AMBULATORY_CARE_PROVIDER_SITE_OTHER): Payer: BC Managed Care – PPO

## 2020-12-15 ENCOUNTER — Other Ambulatory Visit: Payer: Self-pay | Admitting: Endocrinology

## 2020-12-15 DIAGNOSIS — E669 Obesity, unspecified: Secondary | ICD-10-CM

## 2020-12-15 DIAGNOSIS — E1169 Type 2 diabetes mellitus with other specified complication: Secondary | ICD-10-CM

## 2020-12-15 LAB — BASIC METABOLIC PANEL
BUN: 11 mg/dL (ref 6–23)
CO2: 28 mEq/L (ref 19–32)
Calcium: 9.4 mg/dL (ref 8.4–10.5)
Chloride: 103 mEq/L (ref 96–112)
Creatinine, Ser: 0.73 mg/dL (ref 0.40–1.20)
GFR: 89.03 mL/min (ref >60.00)
Glucose, Bld: 102 mg/dL — ABNORMAL HIGH (ref 70–99)
Potassium: 3.9 mEq/L (ref 3.5–5.1)
Sodium: 139 mEq/L (ref 135–145)

## 2020-12-15 LAB — HEMOGLOBIN A1C: Hgb A1c MFr Bld: 6.1 % (ref 4.6–6.5)

## 2020-12-15 LAB — MICROALBUMIN / CREATININE URINE RATIO
Creatinine,U: 219.5 mg/dL
Microalb Creat Ratio: 0.6 mg/g (ref 0.0–30.0)
Microalb, Ur: 1.3 mg/dL (ref 0.0–1.9)

## 2020-12-17 ENCOUNTER — Encounter: Payer: Self-pay | Admitting: Endocrinology

## 2020-12-17 ENCOUNTER — Ambulatory Visit: Payer: BC Managed Care – PPO | Admitting: Endocrinology

## 2020-12-17 ENCOUNTER — Other Ambulatory Visit: Payer: Self-pay

## 2020-12-17 VITALS — BP 118/70 | HR 84 | Ht 64.0 in | Wt 159.0 lb

## 2020-12-17 DIAGNOSIS — E1142 Type 2 diabetes mellitus with diabetic polyneuropathy: Secondary | ICD-10-CM

## 2020-12-17 DIAGNOSIS — E669 Obesity, unspecified: Secondary | ICD-10-CM

## 2020-12-17 DIAGNOSIS — E1169 Type 2 diabetes mellitus with other specified complication: Secondary | ICD-10-CM | POA: Diagnosis not present

## 2020-12-17 NOTE — Progress Notes (Signed)
Patient ID: Melissa Benson, female   DOB: 1960/03/17, 60 y.o.   MRN: [id number]           Reason for Appointment: Follow-up for Type 2 Diabetes   History of Present Illness:          Date of diagnosis of type 2 diabetes mellitus: 2009         Previous history: She had been on various oral hypoglycemic drugs in the past and was on relatively large doses of basal insulin. 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 she was able to stop insulin A1c had gone down to 6% in 2018 but was followed only about once a year She was tried on Metformin alone in 2018 for about a year but this was then stopped  Recent history:   A1c is last relatively higher at 6.8 compared to 6.1  Non-insulin hypoglycemic drugs the patient is taking are: Mounjaro 5mg  weekly   Current management, blood sugar patterns and problems identified: She has done well with taking 5 mg Mounjaro weekly since about 8/22  Although she has lost more weight this may be related to intercurrent illnesses with viruses and COVID causing anorexia  Blood sugars are improved overall in averaging less in the morning compared to last visit  No nausea from [facility] Blood sugars are excellent and only a couple of times higher in the morning She is not able to do much walking because of leg pain  She is again trying to eat healthy meals Most of her blood sugars are in the morning but blood sugars later in the day are below 140        Side effects from medications have been: Nausea, some diarrhea from Metformin, some nausea from glipizide       Typical meal intake: Breakfast is usually having only almonds.  Will have chicken and vegetables at dinner, snacks will be cheese crackers.  Usually not eating out             Glucose monitoring:  done 0-1 times a day         Glucometer:  Contour next.       Blood Glucose readings by time of day and averages from meter download:   PRE-MEAL Fasting Lunch After noon Evening Overall  Glucose range: 92-111    69-178  Mean/median: 101  124 103 112   Previously:  PRE-MEAL Fasting Lunch Dinner Evening Overall  Glucose range: 93-168   88-139 88-168  Mean/median: 118   107 114  Previously    Dietician visit, most recent: 2017,  Weight history:  Wt Readings from Last 3 Encounters:  12/17/20 159 lb (72.1 kg)  09/16/20 172 lb (78 kg)  09/15/20 172 lb 6.4 oz (78.2 kg)    Glycemic control:   Lab Results  Component Value Date   HGBA1C 6.1 12/15/2020   HGBA1C 6.8 (H) 07/08/2020   HGBA1C 6.6 (H) 06/09/2020   Lab Results  Component Value Date   MICROALBUR 1.3 12/15/2020   LDLCALC 46 06/09/2020   CREATININE 0.73 12/15/2020   Lab Results  Component Value Date   MICRALBCREAT 0.6 12/15/2020    Lab Results  Component Value Date   FRUCTOSAMINE 222 09/10/2020   FRUCTOSAMINE 263 03/19/2019    Lab on 12/15/2020  Component Date Value Ref Range Status   Microalb,  Ur 12/15/2020 1.3  0.0 - 1.9 mg/dL Final   Creatinine,U 16/10/9602 219.5  mg/dL Final   Microalb Creat Ratio 12/15/2020 0.6  0.0 - 30.0 mg/g Final   Sodium 12/15/2020 139  135 - 145 mEq/L Final   Potassium 12/15/2020 3.9  3.5 - 5.1 mEq/L Final   Chloride 12/15/2020 103  96 - 112 mEq/L Final   CO2 12/15/2020 28  19 - 32 mEq/L Final   Glucose, Bld 12/15/2020 102 (H)  70 - 99 mg/dL Final   BUN 54/09/8117 11  6 - 23 mg/dL Final   Creatinine, Ser 12/15/2020 0.73  0.40 - 1.20 mg/dL Final   GFR 14/78/2956 89.03  >60.00 mL/min Final   Calculated using the CKD-EPI Creatinine Equation (2021)   Calcium 12/15/2020 9.4  8.4 - 10.5 mg/dL Final   Hgb O1H MFr Bld 12/15/2020 6.1  4.6 - 6.5 % Final   Glycemic Control Guidelines for People with Diabetes:Non Diabetic:  <6%Goal of Therapy: <7%Additional Action Suggested:  >8%     Allergies as of 12/17/2020        Reactions   Morphine And Related Nausea And Vomiting   Pt states also caused arm to turn black where medication was administered    Morphine Other (See Comments)   Prednisone Hives   Other Nausea And Vomiting   TYLOX   Pantoprazole Hives, Rash        Medication List        Accurate as of December 17, 2020  8:23 AM. If you have any questions, ask your nurse or doctor.          ALPRAZolam 0.5 MG tablet Commonly known as: XANAX TAKE 1 TABLET(0.5 MG) BY MOUTH TWICE DAILY AS NEEDED FOR ANXIETY   b complex vitamins capsule Take 1 capsule by mouth daily.   Biotin 08657 MCG Tbdp Take 1 capsule by mouth daily.   chlorpheniramine-HYDROcodone 10-8 MG/5ML Suer Commonly known as: Tussionex Pennkinetic ER Take 5 mLs by mouth every 12 (twelve) hours as needed for cough.   Contour Next Test test strip Generic drug: glucose blood USE ONCE DAILY   cyclobenzaprine 10 MG tablet Commonly known as: FLEXERIL TAKE 1 TABLET(10 MG) BY MOUTH AT BEDTIME   docusate sodium 100 MG capsule Commonly known as: COLACE Take 200 mg by mouth 2 (two) times daily.   fluticasone 50 MCG/ACT nasal spray Commonly known as: FLONASE Place 2 sprays into both nostrils daily.   gabapentin 300 MG capsule Commonly known as: NEURONTIN TAKE 2 CAPSULES BY MOUTH THREE TIMES DAILY   MAGNESIUM PO Take 500 mg by mouth daily.   multivitamin Chew chewable tablet Chew 1 tablet by mouth daily.   promethazine 25 MG tablet Commonly known as: PHENERGAN TAKE 1 TABLET(25 MG) BY MOUTH EVERY 8 HOURS AS NEEDED FOR NAUSEA OR VOMITING   ramipril 5 MG capsule Commonly known as: ALTACE TAKE 1 CAPSULE(5 MG) BY MOUTH DAILY   rosuvastatin 5 MG tablet Commonly known as: CRESTOR TAKE 1 TABLET(5 MG) BY MOUTH DAILY   sucralfate 1 g tablet Commonly known as: CARAFATE Take 1 g by mouth 4 (four) times daily as needed (ulcers).   terconazole 0.4 % vaginal cream Commonly known as: TERAZOL 7 USE EXTERNALLY TWICE DAILY AS  DIRECTED   tirzepatide 5 MG/0.5ML Pen Commonly known as: MOUNJARO Inject 5 mg into the skin once a week. Patient to use co-pay card if insurance does not cover   valACYclovir 500 MG tablet Commonly known as: VALTREX Take 500 mg  by mouth 2 (two) times daily as needed (cold sores outbreak).        Allergies:  Allergies  Allergen Reactions   Morphine And Related Nausea And Vomiting    Pt states also caused arm to turn black where medication was administered    Morphine Other (See Comments)   Prednisone Hives   Other Nausea And Vomiting    TYLOX   Pantoprazole Hives and Rash    Past Medical History:  Diagnosis Date   Anxiety    Complication of anesthesia    Diabetes mellitus    GERD (gastroesophageal reflux disease)    Headache    no issues since stop caffeine   History of hiatal hernia    History of migraine headaches    History of urinary tract infection    last one 11/2014    History of vertigo    Hyperlipidemia    Hypertension    Neck pain, chronic    OSA (obstructive sleep apnea)    on CPAP   PONV (postoperative nausea and vomiting)    Psoriasis    Rosacea    Weak urinary stream     Past Surgical History:  Procedure Laterality Date   2D ECHOCARDIOGRAM  07/20/2011   EF >55%, normal   ABDOMINAL HYSTERECTOMY     APPENDECTOMY     CARDIOVASCULAR STRESS TEST  07/20/2011   Normal myocardial perfusion study, EKG negative for ischemia, no ECG changes   CHOLECYSTECTOMY     colonscopy      polyps removed    DIAGNOSTIC LAPAROSCOPY     DILATION AND CURETTAGE OF UTERUS     LAPAROSCOPIC ROUX-EN-Y GASTRIC BYPASS WITH HIATAL HERNIA REPAIR N/A 01/12/2015   Procedure: LAPAROSCOPIC ROUX-EN-Y GASTRIC BYPASS WITH LYSIS OF ADHESIONS ;  Surgeon: Luretha Murphy, MD;  Location: [facility];  Service: General;  Laterality: N/A;   LAPAROSCOPY  02/01/2017   Laparoscopic lysis of adhesions   LAPAROSCOPY N/A 02/01/2017   Procedure: LAPAROSCOPY Lysis of adhesions;  Surgeon: Berna Bue, MD;  Location: [facility];  Service: General;  Laterality: N/A;   LAPAROTOMY N/A 02/01/2017   Procedure: excision of umbilical mass, small bowel resection;  Surgeon: Berna Bue, MD;  Location: [facility];  Service: General;  Laterality: N/A;   SPINE SURGERY     C6-C7 fusion x2   SPINE SURGERY     herniated disc L4-L5   TRIGGER FINGER RELEASE Left 03/14/2019   Procedure: RELEASE TRIGGER FINGER/A-1 PULLEY LEFT LONG FINGER;  Surgeon: Betha Loa, MD;  Location: [facility];  Service: Orthopedics;  Laterality: Left;  Bier block   TUBAL LIGATION      Family History  Problem Relation Age of Onset   Diabetes Mother    Heart disease Mother    Hypertension Mother    Lung disease Father    Heart disease Father    Hypertension Father    Diabetes Father    Hypertension Sister    Hyperlipidemia Brother    Hypertension Brother    Heart attack Maternal Grandmother    Heart disease Maternal Grandmother    Hypertension Maternal Grandfather    Diabetes Maternal Grandfather    Heart disease Maternal Grandfather     Social History:  reports that she quit smoking about 37 years ago. Her smoking use included cigarettes. She has a 20.00 pack-year smoking history. She has never used smokeless tobacco. She reports that she does not drink alcohol and does not use drugs.  Review of Systems  Lipid history: Has been treated for cardiovascular risk reduction with Crestor 5 mg daily    Lab Results  Component Value Date   CHOL 113 06/09/2020   HDL 51 06/09/2020   LDLCALC 46 06/09/2020   TRIG 81 06/09/2020   CHOLHDL 2.2 06/09/2020           Hypertension: Has been on ramipril 5 mg daily prescribed by PCP  Home readings being checked periodically   BP Readings from Last 3 Encounters:  12/17/20 118/70  12/02/20 104/60  11/13/20 (!) 157/79    Most recent eye exam was in 12/20  Neuropathy: She has only mild tingling or pains in her feet Most recent foot exam: 12/22  Currently  known complications of diabetes: None known  OSTEOPENIA: T score is -1.4 at the hip  LABS:  Lab on 12/15/2020  Component Date Value Ref Range Status   Microalb, Ur 12/15/2020 1.3  0.0 - 1.9 mg/dL Final   Creatinine,U 16/10/9602 219.5  mg/dL Final   Microalb Creat Ratio 12/15/2020 0.6  0.0 - 30.0 mg/g Final   Sodium 12/15/2020 139  135 - 145 mEq/L Final   Potassium 12/15/2020 3.9  3.5 - 5.1 mEq/L Final   Chloride 12/15/2020 103  96 - 112 mEq/L Final   CO2 12/15/2020 28  19 - 32 mEq/L Final   Glucose, Bld 12/15/2020 102 (H)  70 - 99 mg/dL Final   BUN 54/09/8117 11  6 - 23 mg/dL Final   Creatinine, Ser 12/15/2020 0.73  0.40 - 1.20 mg/dL Final   GFR 14/78/2956 89.03  >60.00 mL/min Final   Calculated using the CKD-EPI Creatinine Equation (2021)   Calcium 12/15/2020 9.4  8.4 - 10.5 mg/dL Final   Hgb O1H MFr Bld 12/15/2020 6.1  4.6 - 6.5 % Final   Glycemic Control Guidelines for People with Diabetes:Non Diabetic:  <6%Goal of Therapy: <7%Additional Action Suggested:  >8%     Physical Examination:  BP 118/70    Pulse 84    Ht  (1.626 m)    Wt 159 lb (72.1 kg)    SpO2 97%    BMI 27.29 kg/m   Diabetic Foot Exam - Simple   Simple Foot Form Diabetic Foot exam was performed with the following findings: Yes 12/17/2020  8:39 AM  Visual Inspection No deformities, no ulcerations, no other skin breakdown bilaterally: Yes Sensation Testing Intact to touch and monofilament testing bilaterally: Yes See comments: Yes Pulse Check Posterior Tibialis and Dorsalis pulse intact bilaterally: Yes Comments Monofilament sensation overall decreased on the left toes compared to right Mild calluses present bilaterally distally      ASSESSMENT:  Diabetes type 2 non-insulin-dependent  See history of present illness for detailed discussion of current diabetes management, blood sugar patterns and problems identified   A1c is significantly better at 6.1, previously 6.8  She has again done much  better with Mounjaro compared to Victoza Her blood sugars are overall further improved and even near normal in the mornings No side effects of Mounjaro which she has taken regularly  Neuropathy: Symptoms are minor but she may have a little decreased sensation on the left side  PLAN:    Continue MOUNJARO 5 mg  She will continue to check blood sugars by rotation at different times and try to mark them as postprandial if able to If she is having any intercurrent illnesses with anorexia or nausea she can hold her Mounjaro temporarily unless blood sugars are running higher  She should try to start walking for exercise to maintain her weight loss Follow-up in 4 months  Continue follow-up with PCP for management of her lipids  There are no Patient Instructions on file for this visit.     Reather Littler 12/17/2020, 8:23 AM   Note: This office note was prepared with Dragon voice recognition system technology. Any transcriptional errors that result from this process are unintentional.

## 2020-12-21 ENCOUNTER — Other Ambulatory Visit: Payer: Self-pay | Admitting: Internal Medicine

## 2021-01-12 ENCOUNTER — Other Ambulatory Visit: Payer: Self-pay | Admitting: Internal Medicine

## 2021-01-19 ENCOUNTER — Other Ambulatory Visit: Payer: Self-pay | Admitting: Internal Medicine

## 2021-02-15 ENCOUNTER — Other Ambulatory Visit: Payer: Self-pay | Admitting: Endocrinology

## 2021-02-15 ENCOUNTER — Other Ambulatory Visit: Payer: Self-pay | Admitting: Internal Medicine

## 2021-02-15 DIAGNOSIS — E1165 Type 2 diabetes mellitus with hyperglycemia: Secondary | ICD-10-CM

## 2021-03-15 ENCOUNTER — Other Ambulatory Visit: Payer: BC Managed Care – PPO | Admitting: Internal Medicine

## 2021-03-15 ENCOUNTER — Other Ambulatory Visit: Payer: Self-pay

## 2021-03-15 DIAGNOSIS — E1169 Type 2 diabetes mellitus with other specified complication: Secondary | ICD-10-CM

## 2021-03-15 DIAGNOSIS — E119 Type 2 diabetes mellitus without complications: Secondary | ICD-10-CM

## 2021-03-15 DIAGNOSIS — I10 Essential (primary) hypertension: Secondary | ICD-10-CM | POA: Diagnosis not present

## 2021-03-15 DIAGNOSIS — R5383 Other fatigue: Secondary | ICD-10-CM

## 2021-03-15 DIAGNOSIS — E785 Hyperlipidemia, unspecified: Secondary | ICD-10-CM | POA: Diagnosis not present

## 2021-03-16 LAB — CBC WITH DIFFERENTIAL/PLATELET
Absolute Monocytes: 349 cells/uL (ref 200–950)
Basophils Absolute: 29 cells/uL (ref 0–200)
Basophils Relative: 0.7 %
Eosinophils Absolute: 62 cells/uL (ref 15–500)
Eosinophils Relative: 1.5 %
HCT: 35.1 % (ref 35.0–45.0)
Hemoglobin: 11.7 g/dL (ref 11.7–15.5)
Lymphs Abs: 1328 cells/uL (ref 850–3900)
MCH: 31.1 pg (ref 27.0–33.0)
MCHC: 33.3 g/dL (ref 32.0–36.0)
MCV: 93.4 fL (ref 80.0–100.0)
MPV: 9.7 fL (ref 7.5–12.5)
Monocytes Relative: 8.5 %
Neutro Abs: 2333 cells/uL (ref 1500–7800)
Neutrophils Relative %: 56.9 %
Platelets: 271 10*3/uL (ref 140–400)
RBC: 3.76 10*6/uL — ABNORMAL LOW (ref 3.80–5.10)
RDW: 12.7 % (ref 11.0–15.0)
Total Lymphocyte: 32.4 %
WBC: 4.1 10*3/uL (ref 3.8–10.8)

## 2021-03-16 LAB — COMPLETE METABOLIC PANEL WITH GFR
AG Ratio: 1.8 (calc) (ref 1.0–2.5)
ALT: 12 U/L (ref 6–29)
AST: 19 U/L (ref 10–35)
Albumin: 4.2 g/dL (ref 3.6–5.1)
Alkaline phosphatase (APISO): 74 U/L (ref 37–153)
BUN: 10 mg/dL (ref 7–25)
CO2: 31 mmol/L (ref 20–32)
Calcium: 9.5 mg/dL (ref 8.6–10.4)
Chloride: 104 mmol/L (ref 98–110)
Creat: 0.69 mg/dL (ref 0.50–1.05)
Globulin: 2.3 g/dL (calc) (ref 1.9–3.7)
Glucose, Bld: 91 mg/dL (ref 65–99)
Potassium: 4.5 mmol/L (ref 3.5–5.3)
Sodium: 142 mmol/L (ref 135–146)
Total Bilirubin: 0.4 mg/dL (ref 0.2–1.2)
Total Protein: 6.5 g/dL (ref 6.1–8.1)
eGFR: 99 mL/min/{1.73_m2} (ref >=60)

## 2021-03-16 LAB — LIPID PANEL
Cholesterol: 96 mg/dL (ref <200)
HDL: 51 mg/dL (ref >=50)
LDL Cholesterol (Calc): 30 mg/dL (calc)
Non-HDL Cholesterol (Calc): 45 mg/dL (calc) (ref <130)
Total CHOL/HDL Ratio: 1.9 (calc) (ref <5.0)
Triglycerides: 72 mg/dL (ref <150)

## 2021-03-16 LAB — HEMOGLOBIN A1C
Hgb A1c MFr Bld: 5.5 % of total Hgb (ref <5.7)
Mean Plasma Glucose: 111 mg/dL
eAG (mmol/L): 6.2 mmol/L

## 2021-03-16 LAB — TSH: TSH: 1.15 mIU/L (ref 0.40–4.50)

## 2021-03-19 ENCOUNTER — Encounter: Payer: Self-pay | Admitting: Internal Medicine

## 2021-03-19 ENCOUNTER — Ambulatory Visit (INDEPENDENT_AMBULATORY_CARE_PROVIDER_SITE_OTHER): Payer: BC Managed Care – PPO | Admitting: Internal Medicine

## 2021-03-19 ENCOUNTER — Other Ambulatory Visit: Payer: Self-pay

## 2021-03-19 VITALS — BP 140/74 | HR 80 | Temp 98.0°F | Ht 64.0 in | Wt 154.2 lb

## 2021-03-19 DIAGNOSIS — H938X2 Other specified disorders of left ear: Secondary | ICD-10-CM | POA: Diagnosis not present

## 2021-03-19 DIAGNOSIS — S22000S Wedge compression fracture of unspecified thoracic vertebra, sequela: Secondary | ICD-10-CM

## 2021-03-19 DIAGNOSIS — Z6826 Body mass index (BMI) 26.0-26.9, adult: Secondary | ICD-10-CM

## 2021-03-19 DIAGNOSIS — Z0000 Encounter for general adult medical examination without abnormal findings: Secondary | ICD-10-CM | POA: Diagnosis not present

## 2021-03-19 DIAGNOSIS — R5383 Other fatigue: Secondary | ICD-10-CM

## 2021-03-19 DIAGNOSIS — E1169 Type 2 diabetes mellitus with other specified complication: Secondary | ICD-10-CM

## 2021-03-19 DIAGNOSIS — Z8659 Personal history of other mental and behavioral disorders: Secondary | ICD-10-CM

## 2021-03-19 DIAGNOSIS — Z9884 Bariatric surgery status: Secondary | ICD-10-CM

## 2021-03-19 DIAGNOSIS — R413 Other amnesia: Secondary | ICD-10-CM

## 2021-03-19 DIAGNOSIS — G8929 Other chronic pain: Secondary | ICD-10-CM

## 2021-03-19 DIAGNOSIS — M542 Cervicalgia: Secondary | ICD-10-CM

## 2021-03-19 DIAGNOSIS — F411 Generalized anxiety disorder: Secondary | ICD-10-CM | POA: Diagnosis not present

## 2021-03-19 DIAGNOSIS — I10 Essential (primary) hypertension: Secondary | ICD-10-CM

## 2021-03-19 DIAGNOSIS — Z1159 Encounter for screening for other viral diseases: Secondary | ICD-10-CM

## 2021-03-19 DIAGNOSIS — E785 Hyperlipidemia, unspecified: Secondary | ICD-10-CM

## 2021-03-19 LAB — POCT URINALYSIS DIPSTICK
Bilirubin, UA: NEGATIVE
Blood, UA: NEGATIVE
Glucose, UA: NEGATIVE
Ketones, UA: NEGATIVE
Leukocytes, UA: NEGATIVE
Nitrite, UA: NEGATIVE
Protein, UA: NEGATIVE
Spec Grav, UA: 1.01 (ref 1.010–1.025)
Urobilinogen, UA: 0.2 E.U./dL
pH, UA: 7.5 (ref 5.0–8.0)

## 2021-03-19 MED ORDER — VALACYCLOVIR HCL 500 MG PO TABS: 500.0000 mg | ORAL_TABLET | Freq: Two times a day (BID) | ORAL | 3 refills | Status: DC | PRN

## 2021-03-19 NOTE — Progress Notes (Signed)
? ?Subjective:  ? ? Patient ID: Melissa Benson, female    DOB: [birth year], 61 y.o.   MRN: [id number] ? ?HPI 61 year old  Female seen for health maintenance exam and evaluation of medical issues. ? ?Hx of anxiety treated with Xanax. Worried about her memory. Mother had memory loss. Reviewed meds with her today that I think could contribute to memory issues. She would like evaluation for memory loss. Had CT of brain 2017 that was normal after having severe headache and dizziness. 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 Denies excessive anxiety at work. Has not made any critical mistakes. ? ?Worried about her hearing but hearing is normal in each ear at 25 dcb today. No cerumen in ear canals. ? ?Patient says she will be seeing Dr. Loreta Ave soon for GI evaluation. 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 Having some epigastric discomfort. Had endoscopy and colonoscopy at [facility] 2019. No worrisome findings. ? ?Went on [place] and lost 12 pounds between July and Sept 2022. Is followed for diabetes by Dr. Lucianne Muss. ? ?History of ESBL E. coli UTI in October 2021. ? ?Hx of AP fusion of C6-C7. Had MRI C-spine after episode of LOC and posterior neck pain 2019 showing mild edema T2 superior endplate with minimal anterior loss of height of the vertebral body c/w acute compression fracture. ? ?In January 2019 she was admitted for lysis of adhesions, excision of umbilical mass and small bowel resection.  Surgery was done by Dr. Doylene Canard, general surgeon.  She presented to the hospital with small bowel obstruction. ? ?In November 2019 she suffered a T10 fracture with syncopal episode.  This occurred while taking a prep for colonoscopy and felt acutely nauseated.  Due to this episode colonoscopy was postponed until January 01, 2018.  That study showed diverticulosis and 5-year follow-up recommended.  Upper endoscopy on the same day showed gastric bypass surgery with a small pouch and no lesions of  concern. ? ?History of diabetes mellitus, hyperlipidemia, sleep apnea, hypertension, migraine headaches and paresthesias in feet secondary to lumbar disc disease. ? ?Epigastric discomfort and will be seeing Dr. Loreta Ave soon. Hx gastric bypass surgery. Dr. Ewing Schlein gave her Carafate in 2020 for epigastric pain. Sometimes takes antacid . ? ?Says  memory symptoms worse after Covid-19 in November 2022. ? ?History of recurrent urinary infections but none recently. ? ?Reports having had migraines starting in her late 30s.  Had issues with chronic daily headache for some 20 years.  Tried Topamax.  She stopped Topamax because she thought it made her jittery.  CT of the brain in 2017 was normal. ? ?History of elevated liver enzymes.  We advised her to stop taking Tylenol and NSAIDs and liver function subsequently normalized. ? ?She has been a patient here since 2006.  At that time she weighed 251 pounds.  She weighed 260 pounds in 2009.  Hemoglobin A1c was 9.1% in 2009.  In 2010 she was started on insulin. ? ?In 2014 she lost down to 223 pounds.  In 2017, after gastric bypass surgery, she weighed 173 pounds. ? ?In January 2017 she was found to have T wave inversions on EKG with family history of coronary disease in her mother.  She was referred to cardiologist and underwent a nuclear medicine study which was normal in February 2015. ? ?Patient's mother passed away under hospice care in 2020. ? ?Patient used to see Dr. Emerson Monte for depression.  Patient is no longer  receiving therapy.  Dr. Nolen Mu is no longer in private practice. ? ?Patient has GYN physician. ? ?History of essential hypertension which has been stable on current regimen. ? ?Social history: Married.  Has 2 sons.  Does not smoke. ? ?Family history: Father with history of diabetes mellitus.  Mother deceased with history of heart disease and Alzheimer's disease.  3 brothers, all of them have had substance abuse issues.  2 sisters, 1 of whom is a diabetic and 1  had gastric sleeve surgery with history of breast cancer.  There is 1 brother with history of stroke perhaps related to substance abuse according to the patient.  From time to time, has had situational stress with family but that seems to have improved. ? ? ?Review of Systems: says she had double vision with Covid for less than a day. Says she has had some difficulty.  ? ?   ?Objective:  ? Physical Exam ?Blood pressure 140/74 pulse 80 regular temperature 98 degrees pulse oximetry 99% weight 154 pounds 4 ounces BMI 26.48 ? ?Skin: Warm and dry.  No cervical adenopathy.  No thyromegaly.  No carotid bruits.  Chest is clear.  TMs are clear.  Hearing is normal at 25 dB in each ear.  Cardiac exam: Regular rate and rhythm without ectopy.  Breasts are without masses.  Abdomen: Soft nondistended without hepatosplenomegaly masses or tenderness.  No lower extremity pitting edema.  Brief neurological exam is intact without focal deficits. ? ? ? ?   ?Assessment & Plan:  ?Complaint of hearing loss but hearing is normal at 25 dB today in the office ? ?Complaint of memory loss-needs additional labs B12, folate, iron and iron binding capacity which will be drawn next week.  Due to family history of memory loss in her mother, patient will be seeing neurologist and perhaps needs to have Neuropsychological testing. ? ?History of gastric bypass surgery Roux-en-Y in 2017 ? ?Anxiety state treated with Xanax ? ?History of type 2 diabetes mellitus now on Mounjaro per Dr. Lucianne Muss and hemoglobin A1c excellent at 5.5% ? ?History of peripheral neuropathy treated with Neurontin ? ?History of recurrent urinary infections-none recently ? ?History of depression ? ?History of chronic headaches with normal CT of the brain in 2017.  Had chronic daily headache for some 20 years and tried Topamax.  She stopped Topamax because she thought it made her feel jittery. ? ?History of elevated liver enzymes that normalized after stopping Tylenol and  NSAIDs. ? ?Essential hypertension-stable ? ?Mild hyperlipidemia treated with low-dose Crestor 5 mg daily and lipid panel is normal ? ?HSV type I-refill Valtrex ? ?Plan: Referral to Neurology for evaluation of memory issues.  May need neuropsychological testing.  Patient will return here in 6 months or as needed. ? ? ? ?

## 2021-03-19 NOTE — Patient Instructions (Addendum)
Patient would like to see Neurologist due to some memory loss issues and family history of dementia in mother.  Otherwise continue current medications.  She says she will be seeing Dr. Loreta Ave soon for evaluation of GI issues.  Return in 6 months.  Valtrex refilled as requested for HSV type I. ?

## 2021-03-25 ENCOUNTER — Other Ambulatory Visit: Payer: BC Managed Care – PPO

## 2021-03-25 ENCOUNTER — Other Ambulatory Visit: Payer: Self-pay

## 2021-03-25 DIAGNOSIS — R413 Other amnesia: Secondary | ICD-10-CM

## 2021-03-25 DIAGNOSIS — E559 Vitamin D deficiency, unspecified: Secondary | ICD-10-CM | POA: Diagnosis not present

## 2021-03-25 DIAGNOSIS — Z9884 Bariatric surgery status: Secondary | ICD-10-CM

## 2021-03-25 DIAGNOSIS — R5383 Other fatigue: Secondary | ICD-10-CM | POA: Diagnosis not present

## 2021-03-26 LAB — IRON, TOTAL/TOTAL IRON BINDING CAP
%SAT: 18 % (calc) (ref 16–45)
Iron: 52 ug/dL (ref 45–160)
TIBC: 286 mcg/dL (calc) (ref 250–450)

## 2021-03-26 LAB — VITAMIN D 25 HYDROXY (VIT D DEFICIENCY, FRACTURES): Vit D, 25-Hydroxy: 78 ng/mL (ref 30–100)

## 2021-03-26 LAB — B12 AND FOLATE PANEL
Folate: 23.6 ng/mL
Vitamin B-12: 1599 pg/mL — ABNORMAL HIGH (ref 200–1100)

## 2021-04-07 ENCOUNTER — Other Ambulatory Visit: Payer: Self-pay | Admitting: Endocrinology

## 2021-04-07 DIAGNOSIS — E1165 Type 2 diabetes mellitus with hyperglycemia: Secondary | ICD-10-CM

## 2021-04-08 DIAGNOSIS — R634 Abnormal weight loss: Secondary | ICD-10-CM | POA: Diagnosis not present

## 2021-04-08 DIAGNOSIS — R140 Abdominal distension (gaseous): Secondary | ICD-10-CM | POA: Diagnosis not present

## 2021-04-08 DIAGNOSIS — R1033 Periumbilical pain: Secondary | ICD-10-CM | POA: Diagnosis not present

## 2021-04-08 DIAGNOSIS — K5904 Chronic idiopathic constipation: Secondary | ICD-10-CM | POA: Diagnosis not present

## 2021-04-13 DIAGNOSIS — Z1231 Encounter for screening mammogram for malignant neoplasm of breast: Secondary | ICD-10-CM | POA: Diagnosis not present

## 2021-04-13 DIAGNOSIS — Z01419 Encounter for gynecological examination (general) (routine) without abnormal findings: Secondary | ICD-10-CM | POA: Diagnosis not present

## 2021-04-14 ENCOUNTER — Other Ambulatory Visit (INDEPENDENT_AMBULATORY_CARE_PROVIDER_SITE_OTHER): Payer: BC Managed Care – PPO

## 2021-04-14 DIAGNOSIS — E1169 Type 2 diabetes mellitus with other specified complication: Secondary | ICD-10-CM

## 2021-04-14 DIAGNOSIS — E669 Obesity, unspecified: Secondary | ICD-10-CM

## 2021-04-14 LAB — BASIC METABOLIC PANEL
BUN: 7 mg/dL (ref 6–23)
CO2: 30 mEq/L (ref 19–32)
Calcium: 9.3 mg/dL (ref 8.4–10.5)
Chloride: 101 mEq/L (ref 96–112)
Creatinine, Ser: 0.7 mg/dL (ref 0.40–1.20)
GFR: 93.41 mL/min (ref >60.00)
Glucose, Bld: 104 mg/dL — ABNORMAL HIGH (ref 70–99)
Potassium: 4.3 mEq/L (ref 3.5–5.1)
Sodium: 138 mEq/L (ref 135–145)

## 2021-04-14 LAB — HEMOGLOBIN A1C: Hgb A1c MFr Bld: 5.9 % (ref 4.6–6.5)

## 2021-04-15 ENCOUNTER — Ambulatory Visit: Payer: BC Managed Care – PPO | Admitting: Internal Medicine

## 2021-04-15 ENCOUNTER — Encounter: Payer: Self-pay | Admitting: Internal Medicine

## 2021-04-15 VITALS — BP 148/80 | HR 91 | Temp 99.0°F | Ht 64.0 in | Wt 148.5 lb

## 2021-04-15 DIAGNOSIS — R350 Frequency of micturition: Secondary | ICD-10-CM

## 2021-04-15 DIAGNOSIS — E119 Type 2 diabetes mellitus without complications: Secondary | ICD-10-CM

## 2021-04-15 DIAGNOSIS — R300 Dysuria: Secondary | ICD-10-CM

## 2021-04-15 LAB — POCT URINALYSIS DIPSTICK
Bilirubin, UA: NEGATIVE
Glucose, UA: NEGATIVE
Ketones, UA: NEGATIVE
Nitrite, UA: NEGATIVE
Protein, UA: NEGATIVE
Spec Grav, UA: <=1.005 — AB (ref 1.010–1.025)
Urobilinogen, UA: 0.2 E.U./dL
pH, UA: 7 (ref 5.0–8.0)

## 2021-04-15 MED ORDER — FLUCONAZOLE 150 MG PO TABS: 150.0000 mg | ORAL_TABLET | Freq: Once | ORAL | 0 refills | Status: AC

## 2021-04-15 MED ORDER — AMOXICILLIN-POT CLAVULANATE 875-125 MG PO TABS: 1.0000 | ORAL_TABLET | Freq: Two times a day (BID) | ORAL | 0 refills | Status: DC

## 2021-04-15 NOTE — Progress Notes (Signed)
? ?  Subjective:  ? ? Patient ID: Melissa Benson, female    DOB: September 27, 1960, 61 y.o.   MRN: [id number] ? ?HPI 61 year old Female seen for nocturia and urinary frequency today.  She had urine checked in March and results were normal. ? ?Hx of ESBL E. Coli UTI October 2021 ? ?No fever, chills, nausea or vomiting ? ?Review of Systems had normal U/A in March. Scheduled for left ovary ultrasound in May. GYN says that ovary was enlarged. ? ?   ?Objective:  ? Physical Exam ? BP 148/80  ? ? Dipstick urine has large occult blood and trace LE ? ?No significant CVA tenderness ? ?   ?Assessment & Plan:  ? ?Nocturia ? ?Urinary frequency ? ?Type 2 diabetes mellitus ? ?Abnormal urine dipstick with large occult blood and trace LE.  Culture pending. ? ?Plan: Patient will be treated with Augmentin 875/125 twice daily for 10 days.  Diflucan 150 mg tablet one-time dose also sent. ? ?Addendum: Urine culture resulted shows no growth.  Patient informed of was advised to complete course of antibiotics. ?

## 2021-04-16 LAB — URINE CULTURE
MICRO NUMBER:: 13259592
Result:: NO GROWTH
SPECIMEN QUALITY:: ADEQUATE

## 2021-04-20 ENCOUNTER — Ambulatory Visit: Payer: BC Managed Care – PPO | Admitting: Neurology

## 2021-04-21 ENCOUNTER — Ambulatory Visit: Payer: BC Managed Care – PPO | Admitting: Endocrinology

## 2021-04-21 ENCOUNTER — Encounter: Payer: Self-pay | Admitting: Endocrinology

## 2021-04-21 ENCOUNTER — Other Ambulatory Visit (HOSPITAL_COMMUNITY): Payer: Self-pay

## 2021-04-21 ENCOUNTER — Telehealth: Payer: Self-pay

## 2021-04-21 VITALS — BP 138/72 | HR 86 | Ht 64.0 in | Wt 145.8 lb

## 2021-04-21 DIAGNOSIS — M85859 Other specified disorders of bone density and structure, unspecified thigh: Secondary | ICD-10-CM | POA: Diagnosis not present

## 2021-04-21 DIAGNOSIS — E1169 Type 2 diabetes mellitus with other specified complication: Secondary | ICD-10-CM

## 2021-04-21 DIAGNOSIS — E669 Obesity, unspecified: Secondary | ICD-10-CM

## 2021-04-21 DIAGNOSIS — I10 Essential (primary) hypertension: Secondary | ICD-10-CM

## 2021-04-21 MED ORDER — TIRZEPATIDE 2.5 MG/0.5ML ~~LOC~~ SOAJ: 2.5000 mg | SUBCUTANEOUS | 2 refills | Status: DC

## 2021-04-21 NOTE — Patient Instructions (Addendum)
Vitamin D 1x daily ? ?Please call [phone] to schedule a Bone Density (DexaScan) a the [place] office at [address] [place].  ?

## 2021-04-21 NOTE — Telephone Encounter (Signed)
Patient Advocate Encounter ? ?Prior Authorization for [employer] 2mg /0.63ml pen injectors has been approved.   ? ?PA# N/A ? ?Effective dates: 04/21/21 through 04/20/22 ? ?Refill too soon. ? ? ?Patient Advocate ?Fax: [phone]  ?

## 2021-04-21 NOTE — Progress Notes (Signed)
Patient ID: Melissa Benson, female   DOB: January 26, 1960, 61 y.o.   MRN: [id number] ? ?       ? ? ?Reason for Appointment: Follow-up for Type 2 Diabetes ? ? ?History of Present Illness:  ?        ?Date of diagnosis of type 2 diabetes mellitus: 2009      ?  ? ?Previous history: She had been on various oral hypoglycemic drugs in the past and was on relatively large doses of basal insulin. With starting Victoza in 04/2011 she had drastically cut down her insulin requirement from her previous dosage of 80 units and had lost weight progressively also ?After her gastric bypass surgery in 2017 she was able to stop insulin ?A1c had gone down to 6% in 2018 but was followed only about once a year ?She was tried on Metformin alone in 2018 for about a year but this was then stopped ? ?Recent history:  ? ? ?Non-insulin hypoglycemic drugs the patient is taking are: Mounjaro 5mg  weekly ? ? ?A1c is more recently around 6% ? ?Current management, blood sugar patterns and problems identified: ?She has excellent blood sugar control he was 5 mg Mounjaro that was started in 08/2020  ?However she continues to lose weight although this has been related to decreased appetite from other health problems and more recently severe constipation  ?She says she has not had to taste for certain foods like sweets  ?Has been regular with her injections ?No nausea from [facility] ?Usually trying to limit portions of carbohydrates ?Blood sugars are excellent after meals ?Only 1 she had a high reading of 243 after eating cake ?As before occasionally will have high readings fasting on the lab glucose was 104 ?She is not able to do much walking because of leg pain ? ?      ?Side effects from medications have been: Nausea, some diarrhea from Metformin, some nausea from glipizide  ?    ? ?Typical meal intake: Breakfast is usually having only almonds.  Will have chicken and vegetables at dinner, snacks will be cheese crackers.  Usually not eating out             ? ?Glucose monitoring:  done 1+ times a day         Glucometer: Contour next.    ?   ?Blood Glucose readings by time of day and averages from meter download: ? ? ?PRE-MEAL A.m. Lunch Afternoon 5 pm-12 AM Overall  ?Glucose range: 96-137  89-243 75-111   ?Mean/median:  [id number]  ? ?Previously ? ?PRE-MEAL Fasting Lunch After noon Evening Overall  ?Glucose range: 92-111    69-178  ?Mean/median: [id number]  ? ?Previously: ? ?Dietician visit, most recent: 2017, ? ?Weight history: ? ?Wt Readings from Last 3 Encounters:  ?04/21/21 145 lb 12.8 oz (66.1 kg)  ?04/15/21 148 lb 8 oz (67.4 kg)  ?03/19/21 154 lb 4 oz (70 kg)  ? ? ?Glycemic control: ?  ?Lab Results  ?Component Value Date  ? HGBA1C 5.9 04/14/2021  ? HGBA1C 5.5 03/15/2021  ? HGBA1C 6.1 12/15/2020  ? ?Lab Results  ?Component Value Date  ? MICROALBUR 1.3 12/15/2020  ? [place] 30 03/15/2021  ? CREATININE 0.70 04/14/2021  ? ?Lab Results  ?Component Value Date  ? MICRALBCREAT 0.6 12/15/2020  ? ? ?Lab Results  ?Component Value Date  ? FRUCTOSAMINE 222 09/10/2020  ? FRUCTOSAMINE 263 03/19/2019  ? ? ?Office Visit on 04/15/2021  ?  Component Date Value Ref Range Status  ? Color, UA 04/15/2021 yellow   Final  ? Clarity, UA 04/15/2021 clear   Final  ? Glucose, UA 04/15/2021 Negative  Negative Final  ? Bilirubin, UA 04/15/2021 neg   Final  ? Ketones, UA 04/15/2021 neg   Final  ? Spec Grav, UA 04/15/2021 <=1.005 (A)  1.010 - 1.025 Final  ? Blood, UA 04/15/2021 large   Final  ? pH, UA 04/15/2021 7.0  5.0 - 8.0 Final  ? Protein, UA 04/15/2021 Negative  Negative Final  ? Urobilinogen, UA 04/15/2021 0.2  0.2 or 1.0 E.U./dL Final  ? Nitrite, UA 04/15/2021 neg   Final  ? Leukocytes, UA 04/15/2021 Trace (A)  Negative Final  ? MICRO NUMBER: 04/15/2021 [id number]   Final  ? SPECIMEN QUALITY: 04/15/2021 Adequate   Final  ? Sample Source 04/15/2021 NOT GIVEN   Final  ? STATUS: 04/15/2021 FINAL   Final  ? Result: 04/15/2021 No Growth   Final  ? ? ?Allergies as of 04/21/2021   ? ?    Reactions  ? Morphine And Related Nausea And Vomiting  ? Pt states also caused arm to turn black where medication was administered   ? Morphine Other (See Comments)  ? Prednisone Hives  ? Other Nausea And Vomiting  ? TYLOX  ? Pantoprazole Hives, Rash  ? ?  ? ?  ?Medication List  ?  ? ?  ? Accurate as of April 21, 2021  9:28 AM. If you have any questions, ask your nurse or doctor.  ?  ?  ? ?  ? ?STOP taking these medications   ? ?Mounjaro 5 MG/0.5ML Pen ?Generic drug: tirzepatide ?Replaced by: tirzepatide 2.5 MG/0.5ML Pen ?Stopped by: Elayne Snare, MD ?  ? ?  ? ?TAKE these medications   ? ?ALPRAZolam 0.5 MG tablet ?Commonly known as: Duanne Moron ?TAKE 1 TABLET(0.5 MG) BY MOUTH TWICE DAILY AS NEEDED FOR ANXIETY ?  ?amoxicillin-clavulanate 875-125 MG tablet ?Commonly known as: AUGMENTIN ?Take 1 tablet by mouth 2 (two) times daily. ?  ?b complex vitamins capsule ?Take 1 capsule by mouth daily. ?  ?Biotin 10000 MCG Tbdp ?Take 1 capsule by mouth daily. ?  ?Contour Next Test test strip ?Generic drug: glucose blood ?USE ONCE DAILY ?  ?cyclobenzaprine 10 MG tablet ?Commonly known as: FLEXERIL ?TAKE 1 TABLET(10 MG) BY MOUTH AT BEDTIME ?  ?fluticasone 50 MCG/ACT nasal spray ?Commonly known as: FLONASE ?Place 2 sprays into both nostrils daily. ?  ?gabapentin 300 MG capsule ?Commonly known as: NEURONTIN ?TAKE 2 CAPSULES BY MOUTH THREE TIMES DAILY ?  ?linaclotide 72 MCG capsule ?Commonly known as: LINZESS ?Take 72 mcg by mouth daily before breakfast. ?  ?multivitamin Chew chewable tablet ?Chew 1 tablet by mouth daily. ?  ?promethazine 25 MG tablet ?Commonly known as: PHENERGAN ?TAKE 1 TABLET(25 MG) BY MOUTH EVERY 8 HOURS AS NEEDED FOR NAUSEA OR VOMITING ?  ?ramipril 5 MG capsule ?Commonly known as: ALTACE ?TAKE 1 CAPSULE(5 MG) BY MOUTH DAILY ?  ?rosuvastatin 5 MG tablet ?Commonly known as: CRESTOR ?TAKE 1 TABLET(5 MG) BY MOUTH DAILY ?  ?terconazole 0.4 % vaginal cream ?Commonly known as: TERAZOL 7 ?USE EXTERNALLY TWICE DAILY AS  DIRECTED ?  ?tirzepatide 2.5 MG/0.5ML Pen ?Commonly known as: MOUNJARO ?Inject 2.5 mg into the skin once a week. ?Replaces: Mounjaro 5 MG/0.5ML Pen ?Started by: Elayne Snare, MD ?  ?valACYclovir 500 MG tablet ?Commonly known as: VALTREX ?Take 1 tablet (500 mg total) by mouth 2 (two)  times daily as needed (cold sores outbreak). ?  ? ?  ? ? ?Allergies:  ?Allergies  ?Allergen Reactions  ? Morphine And Related Nausea And Vomiting  ?  Pt states also caused arm to turn black where medication was administered   ? Morphine Other (See Comments)  ? Prednisone Hives  ? Other Nausea And Vomiting  ?  TYLOX  ? Pantoprazole Hives and Rash  ? ? ?Past Medical History:  ?Diagnosis Date  ? Anxiety   ? Complication of anesthesia   ? Diabetes mellitus   ? GERD (gastroesophageal reflux disease)   ? Headache   ? no issues since stop caffeine  ? History of hiatal hernia   ? History of migraine headaches   ? History of urinary tract infection   ? last one 11/2014   ? History of vertigo   ? Hyperlipidemia   ? Hypertension   ? Neck pain, chronic   ? OSA (obstructive sleep apnea)   ? on CPAP  ? PONV (postoperative nausea and vomiting)   ? Psoriasis   ? Rosacea   ? Weak urinary stream   ? ? ?Past Surgical History:  ?Procedure Laterality Date  ? 2D ECHOCARDIOGRAM  07/20/2011  ? EF >55%, normal  ? ABDOMINAL HYSTERECTOMY    ? APPENDECTOMY    ? CARDIOVASCULAR STRESS TEST  07/20/2011  ? Normal myocardial perfusion study, EKG negative for ischemia, no ECG changes  ? CHOLECYSTECTOMY    ? colonscopy     ? polyps removed   ? DIAGNOSTIC LAPAROSCOPY    ? DILATION AND CURETTAGE OF UTERUS    ? LAPAROSCOPIC ROUX-EN-Y GASTRIC BYPASS WITH HIATAL HERNIA REPAIR N/A 01/12/2015  ? Procedure: LAPAROSCOPIC ROUX-EN-Y GASTRIC BYPASS WITH LYSIS OF ADHESIONS ;  Surgeon: Johnathan Hausen, MD;  Location: [facility];  Service: General;  Laterality: N/A;  ? LAPAROSCOPY  02/01/2017  ? Laparoscopic lysis of adhesions  ? LAPAROSCOPY N/A 02/01/2017  ? Procedure: LAPAROSCOPY Lysis of  adhesions;  Surgeon: Clovis Riley, MD;  Location: [place];  Service: General;  Laterality: N/A;  ? LAPAROTOMY N/A 02/01/2017  ? Procedure: excision of umbilical mass, small bowel resection;  Surgeon: Clovis Riley,

## 2021-04-21 NOTE — Progress Notes (Signed)
Results have been relayed to the patient. The patient verbalized understanding. No questions at this time.   

## 2021-04-22 ENCOUNTER — Other Ambulatory Visit: Payer: Self-pay | Admitting: Endocrinology

## 2021-04-22 DIAGNOSIS — M85859 Other specified disorders of bone density and structure, unspecified thigh: Secondary | ICD-10-CM

## 2021-04-28 ENCOUNTER — Ambulatory Visit (INDEPENDENT_AMBULATORY_CARE_PROVIDER_SITE_OTHER)
Admission: RE | Admit: 2021-04-28 | Discharge: 2021-04-28 | Disposition: A | Discharge status: Home / Self Care | Payer: BC Managed Care – PPO | Source: Ambulatory Visit | Attending: Endocrinology | Admitting: Endocrinology

## 2021-04-28 DIAGNOSIS — M85859 Other specified disorders of bone density and structure, unspecified thigh: Secondary | ICD-10-CM | POA: Diagnosis not present

## 2021-04-30 DIAGNOSIS — M85859 Other specified disorders of bone density and structure, unspecified thigh: Secondary | ICD-10-CM | POA: Diagnosis not present

## 2021-05-01 NOTE — Patient Instructions (Addendum)
Patient to take Augmentin 875/125 twice daily for 10 days.  Urine culture has been sent.  Diflucan has also been sent 150 mg tablet one-time dose ?

## 2021-05-02 NOTE — Progress Notes (Signed)
Please call to let patient know that the bone density is unchanged/slightly better and no further action needed

## 2021-05-06 DIAGNOSIS — Z6825 Body mass index (BMI) 25.0-25.9, adult: Secondary | ICD-10-CM | POA: Diagnosis not present

## 2021-05-06 DIAGNOSIS — R1909 Other intra-abdominal and pelvic swelling, mass and lump: Secondary | ICD-10-CM | POA: Diagnosis not present

## 2021-05-25 ENCOUNTER — Other Ambulatory Visit: Payer: Self-pay | Admitting: Internal Medicine

## 2021-05-25 ENCOUNTER — Encounter: Payer: Self-pay | Admitting: Internal Medicine

## 2021-05-25 DIAGNOSIS — Z961 Presence of intraocular lens: Secondary | ICD-10-CM | POA: Diagnosis not present

## 2021-05-25 DIAGNOSIS — H26493 Other secondary cataract, bilateral: Secondary | ICD-10-CM | POA: Diagnosis not present

## 2021-05-25 DIAGNOSIS — H40013 Open angle with borderline findings, low risk, bilateral: Secondary | ICD-10-CM | POA: Diagnosis not present

## 2021-05-25 LAB — HM DIABETES EYE EXAM

## 2021-05-31 ENCOUNTER — Ambulatory Visit
Admission: EM | Admit: 2021-05-31 | Discharge: 2021-05-31 | Disposition: A | Discharge status: Home / Self Care | Payer: BC Managed Care – PPO | Attending: Family Medicine | Admitting: Family Medicine

## 2021-05-31 DIAGNOSIS — N390 Urinary tract infection, site not specified: Secondary | ICD-10-CM | POA: Diagnosis not present

## 2021-05-31 LAB — POCT URINALYSIS DIP (MANUAL ENTRY)
Bilirubin, UA: NEGATIVE
Glucose, UA: NEGATIVE mg/dL
Ketones, POC UA: NEGATIVE mg/dL
Nitrite, UA: POSITIVE — AB
Protein Ur, POC: NEGATIVE mg/dL
Spec Grav, UA: <=1.005 — AB (ref 1.010–1.025)
Urobilinogen, UA: 0.2 E.U./dL
pH, UA: 5.5 (ref 5.0–8.0)

## 2021-05-31 MED ORDER — SULFAMETHOXAZOLE-TRIMETHOPRIM 800-160 MG PO TABS: 1.0000 | ORAL_TABLET | Freq: Two times a day (BID) | ORAL | 0 refills | Status: DC

## 2021-05-31 MED ORDER — PHENAZOPYRIDINE HCL 200 MG PO TABS: 200.0000 mg | ORAL_TABLET | Freq: Three times a day (TID) | ORAL | 0 refills | Status: DC

## 2021-05-31 NOTE — ED Provider Notes (Signed)
Melissa Benson    CSN: [id number] Arrival date & time: 05/31/21  1232      History   Chief Complaint Chief Complaint  Patient presents with   Dysuria    HPI Melissa Benson is a 61 y.o. female.   Presenting today with dysuria, suprapubic pressure, urinary frequency and urgency x2 days.  Denies fever, chills, nausea, vomiting, hematuria, flank pain.  Trying Azo, cranberry, probiotics with minimal relief.  History of urinary tract infections.   Past Medical History:  Diagnosis Date   Anxiety    Complication of anesthesia    Diabetes mellitus    GERD (gastroesophageal reflux disease)    Headache    no issues since stop caffeine   History of hiatal hernia    History of migraine headaches    History of urinary tract infection    last one 11/2014    History of vertigo    Hyperlipidemia    Hypertension    Neck pain, chronic    OSA (obstructive sleep apnea)    on CPAP   PONV (postoperative nausea and vomiting)    Psoriasis    Rosacea    Weak urinary stream     Patient Active Problem List   Diagnosis Date Noted   Chronic pancreatitis, unspecified pancreatitis type ([place]) 09/16/2020   Family history of colon cancer 01/29/2018   History of total abdominal hysterectomy 01/29/2018   Family history of Alzheimer's disease 01/29/2018   Family history of breast cancer 11/21/2017   Diabetes mellitus ([place]) 11/21/2017   S/P exploratory laparotomy 02/02/2017   Internal hernia 02/02/2017   Chronic daily headache 07/27/2015   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   Controlled diabetes mellitus type II without complication ([place]) [id number]   Obesity 10/15/2012   Hyperlipidemia 09/26/2012   Chronic neck pain 12/04/2010   History of migraine headaches 12/04/2010   E11.9 06/03/2010   Hypertension 06/03/2010   OSA on CPAP 06/03/2010    Past Surgical History:  Procedure Laterality Date   2D ECHOCARDIOGRAM  07/20/2011   EF >55%, normal   ABDOMINAL  HYSTERECTOMY     APPENDECTOMY     CARDIOVASCULAR STRESS TEST  07/20/2011   Normal myocardial perfusion study, EKG negative for ischemia, no ECG changes   CHOLECYSTECTOMY     colonscopy      polyps removed    DIAGNOSTIC LAPAROSCOPY     DILATION AND CURETTAGE OF UTERUS     LAPAROSCOPIC ROUX-EN-Y GASTRIC BYPASS WITH HIATAL HERNIA REPAIR N/A 01/12/2015   Procedure: LAPAROSCOPIC ROUX-EN-Y GASTRIC BYPASS WITH LYSIS OF ADHESIONS ;  Surgeon: Johnathan Hausen, MD;  Location: [facility];  Service: General;  Laterality: N/A;   LAPAROSCOPY  02/01/2017   Laparoscopic lysis of adhesions   LAPAROSCOPY N/A 02/01/2017   Procedure: LAPAROSCOPY Lysis of adhesions;  Surgeon: Clovis Riley, MD;  Location: [place];  Service: General;  Laterality: N/A;   LAPAROTOMY N/A 02/01/2017   Procedure: excision of umbilical mass, small bowel resection;  Surgeon: Clovis Riley, MD;  Location: [place];  Service: General;  Laterality: N/A;   SPINE SURGERY     C6-C7 fusion x2   SPINE SURGERY     herniated disc L4-L5   TRIGGER FINGER RELEASE Left 03/14/2019   Procedure: RELEASE TRIGGER FINGER/A-1 PULLEY LEFT LONG FINGER;  Surgeon: Leanora Cover, MD;  Location: [place];  Service: Orthopedics;  Laterality: Left;  Bier block   TUBAL LIGATION      OB History  No obstetric history on file.      Home Medications    Prior to Admission medications   Medication Sig Start Date End Date Taking? Authorizing Provider  phenazopyridine (PYRIDIUM) 200 MG tablet Take 1 tablet (200 mg total) by mouth 3 (three) times daily. 05/31/21  Yes Volney American, PA-C  sulfamethoxazole-trimethoprim (BACTRIM DS) 800-160 MG tablet Take 1 tablet by mouth 2 (two) times daily for 3 days. 05/31/21 06/03/21 Yes Volney American, PA-C  ALPRAZolam Duanne Moron) 0.5 MG tablet TAKE 1 TABLET(0.5 MG) BY MOUTH TWICE DAILY AS NEEDED FOR ANXIETY 01/19/21   Elby Showers, MD  amoxicillin-clavulanate (AUGMENTIN) 875-125 MG tablet Take 1 tablet by  mouth 2 (two) times daily. 04/15/21   Elby Showers, MD  b complex vitamins capsule Take 1 capsule by mouth daily.    [provider]  Biotin 10000 MCG TBDP Take 1 capsule by mouth daily.     [provider]  CONTOUR NEXT TEST test strip USE ONCE DAILY 12/15/20   Elayne Snare, MD  cyclobenzaprine (FLEXERIL) 10 MG tablet TAKE 1 TABLET(10 MG) BY MOUTH AT BEDTIME 02/15/21   Elby Showers, MD  fluticasone (FLONASE) 50 MCG/ACT nasal spray Place 2 sprays into both nostrils daily. 11/13/20   Melynda Ripple, MD  gabapentin (NEURONTIN) 300 MG capsule TAKE 2 CAPSULES BY MOUTH THREE TIMES DAILY 12/21/20   Elby Showers, MD  linaclotide [facility]) 72 MCG capsule Take 72 mcg by mouth daily before breakfast.    [provider]  multivitamin (VIT W/EXTRA C) CHEW chewable tablet Chew 1 tablet by mouth daily.    [provider]  promethazine (PHENERGAN) 25 MG tablet TAKE 1 TABLET(25 MG) BY MOUTH EVERY 8 HOURS AS NEEDED FOR NAUSEA OR VOMITING 05/25/21   Elby Showers, MD  ramipril (ALTACE) 5 MG capsule TAKE 1 CAPSULE(5 MG) BY MOUTH DAILY 10/14/20   Elby Showers, MD  rosuvastatin (CRESTOR) 5 MG tablet TAKE 1 TABLET(5 MG) BY MOUTH DAILY 01/12/21   Elby Showers, MD  terconazole (TERAZOL 7) 0.4 % vaginal cream USE EXTERNALLY TWICE DAILY AS DIRECTED 02/09/20   Elby Showers, MD  tirzepatide [facility]) 2.5 MG/0.5ML Pen Inject 2.5 mg into the skin once a week. 04/21/21   Elayne Snare, MD  valACYclovir (VALTREX) 500 MG tablet Take 1 tablet (500 mg total) by mouth 2 (two) times daily as needed (cold sores outbreak). 03/19/21   Elby Showers, MD    Family History Family History  Problem Relation Age of Onset   Diabetes Mother    Heart disease Mother    Hypertension Mother    Lung disease Father    Heart disease Father    Hypertension Father    Diabetes Father    Hypertension Sister    Hyperlipidemia Brother    Hypertension Brother    Heart attack Maternal Grandmother    Heart  disease Maternal Grandmother    Hypertension Maternal Grandfather    Diabetes Maternal Grandfather    Heart disease Maternal Grandfather     Social History Social History   Tobacco Use   Smoking status: Former    Packs/day: 2.50    Years: 8.00    Pack years: 20.00    Types: Cigarettes    Quit date: 05/18/1983    Years since quitting: 38.0   Smokeless tobacco: Never  Vaping Use   Vaping Use: Never used  Substance Use Topics   Alcohol use: No    Alcohol/week: 0.0 standard  drinks   Drug use: No     Allergies   Morphine and related, Morphine, Prednisone, Other, and Pantoprazole   Review of Systems Review of Systems Per HPI  Physical Exam Triage Vital Signs ED Triage Vitals  Enc Vitals Group     BP 05/31/21 1303 125/79     Pulse Rate 05/31/21 1303 93     Resp 05/31/21 1303 18     Temp 05/31/21 1303 98.9 F (37.2 C)     Temp src --      SpO2 05/31/21 1303 96 %     Weight --      Height --      Head Circumference --      Peak Flow --      Pain Score 05/31/21 1307 0     Pain Loc --      Pain Edu? --      Excl. in GC? --    No data found.  Updated Vital Signs BP 125/79   Pulse 93   Temp 98.9 F (37.2 C)   Resp 18   SpO2 96%   Visual Acuity Right Eye Distance:   Left Eye Distance:   Bilateral Distance:    Right Eye Near:   Left Eye Near:    Bilateral Near:     Physical Exam Vitals and nursing note reviewed.  Constitutional:      Appearance: Normal appearance. She is not ill-appearing.  HENT:     Head: Atraumatic.  Eyes:     Extraocular Movements: Extraocular movements intact.     Conjunctiva/sclera: Conjunctivae normal.  Cardiovascular:     Rate and Rhythm: Normal rate and regular rhythm.     Heart sounds: Normal heart sounds.  Pulmonary:     Effort: Pulmonary effort is normal.     Breath sounds: Normal breath sounds.  Abdominal:     General: Bowel sounds are normal. There is no distension.     Palpations: Abdomen is soft.      Tenderness: There is no abdominal tenderness. There is no right CVA tenderness or guarding.  Musculoskeletal:        General: Normal range of motion.     Cervical back: Normal range of motion and neck supple.  Skin:    General: Skin is warm and dry.  Neurological:     Mental Status: She is alert and oriented to person, place, and time.  Psychiatric:        Mood and Affect: Mood normal.        Thought Content: Thought content normal.        Judgment: Judgment normal.     UC Treatments / Results  Labs (all labs ordered are listed, but only abnormal results are displayed) Labs Reviewed  POCT URINALYSIS DIP (MANUAL ENTRY) - Abnormal; Notable for the following components:      Result Value   Spec Grav, UA <=1.005 (*)    Blood, UA small (*)    Nitrite, UA Positive (*)    Leukocytes, UA Small (1+) (*)    All other components within normal limits  URINE CULTURE    EKG   Radiology No results found.  Procedures Procedures (including critical Benson time)  Medications Ordered in UC Medications - No data to display  Initial Impression / Assessment and Plan / UC Course  I have reviewed the triage vital signs and the nursing notes.  Pertinent labs & imaging results that were available during my Benson of the patient  were reviewed by me and considered in my medical decision making (see chart for details).     UA showing evidence of urinary tract infection.  Urine culture pending, treat with Bactrim, Pyridium, fluids and follow-up for worsening symptoms.  Final Clinical Impressions(s) / UC Diagnoses   Final diagnoses:  Acute lower UTI   Discharge Instructions   None    ED Prescriptions     Medication Sig Dispense Auth. Provider   sulfamethoxazole-trimethoprim (BACTRIM DS) 800-160 MG tablet Take 1 tablet by mouth 2 (two) times daily for 3 days. 6 tablet Volney American, [place]   phenazopyridine (PYRIDIUM) 200 MG tablet Take 1 tablet (200 mg total) by mouth 3 (three)  times daily. 6 tablet Volney American, [place]      PDMP not reviewed this encounter.   Volney American, [place] 05/31/21 1416

## 2021-05-31 NOTE — ED Triage Notes (Signed)
Pt presents with c/o dysuria and also had diarrhea and dizziness that began on Saturday , took azo this morning

## 2021-06-02 ENCOUNTER — Telehealth (HOSPITAL_COMMUNITY): Payer: Self-pay | Admitting: Emergency Medicine

## 2021-06-02 DIAGNOSIS — H26492 Other secondary cataract, left eye: Secondary | ICD-10-CM | POA: Diagnosis not present

## 2021-06-02 LAB — URINE CULTURE: Culture: 30000 — AB

## 2021-06-02 MED ORDER — CEPHALEXIN 500 MG PO CAPS: 500.0000 mg | ORAL_CAPSULE | Freq: Two times a day (BID) | ORAL | 0 refills | Status: AC

## 2021-06-09 DIAGNOSIS — H26491 Other secondary cataract, right eye: Secondary | ICD-10-CM | POA: Diagnosis not present

## 2021-06-15 ENCOUNTER — Other Ambulatory Visit: Payer: Self-pay | Admitting: Internal Medicine

## 2021-06-17 DIAGNOSIS — H5213 Myopia, bilateral: Secondary | ICD-10-CM | POA: Diagnosis not present

## 2021-07-02 ENCOUNTER — Other Ambulatory Visit (INDEPENDENT_AMBULATORY_CARE_PROVIDER_SITE_OTHER): Payer: BC Managed Care – PPO

## 2021-07-02 DIAGNOSIS — E1169 Type 2 diabetes mellitus with other specified complication: Secondary | ICD-10-CM | POA: Diagnosis not present

## 2021-07-02 DIAGNOSIS — E669 Obesity, unspecified: Secondary | ICD-10-CM | POA: Diagnosis not present

## 2021-07-02 LAB — BASIC METABOLIC PANEL
BUN: 11 mg/dL (ref 6–23)
CO2: 30 mEq/L (ref 19–32)
Calcium: 9.1 mg/dL (ref 8.4–10.5)
Chloride: 105 mEq/L (ref 96–112)
Creatinine, Ser: 0.72 mg/dL (ref 0.40–1.20)
GFR: 90.17 mL/min (ref >60.00)
Glucose, Bld: 98 mg/dL (ref 70–99)
Potassium: 3.8 mEq/L (ref 3.5–5.1)
Sodium: 140 mEq/L (ref 135–145)

## 2021-07-02 LAB — HEMOGLOBIN A1C: Hgb A1c MFr Bld: 5.3 % (ref 4.6–6.5)

## 2021-07-09 ENCOUNTER — Other Ambulatory Visit: Payer: BC Managed Care – PPO

## 2021-07-09 ENCOUNTER — Telehealth: Payer: Self-pay

## 2021-07-09 NOTE — Telephone Encounter (Signed)
Patient needs refill on Mounjaro. Increase to 5mg ? Please advise

## 2021-07-12 ENCOUNTER — Telehealth: Payer: Self-pay

## 2021-07-12 ENCOUNTER — Telehealth: Payer: Self-pay | Admitting: Endocrinology

## 2021-07-12 NOTE — Telephone Encounter (Signed)
error 

## 2021-07-12 NOTE — Telephone Encounter (Signed)
Patient came into office to get a sample of Mounjaro. States pharmacy told her that she has to get a PA before they can fill.  Patient advises was supposed to take her shot yesterday and has no medication. Secure chat sent to clinic team re: same

## 2021-07-12 NOTE — Progress Notes (Unsigned)
Patient ID: Melissa Benson, female   DOB: 10/09/1960, 61 y.o.   MRN: [id number]           Reason for Appointment: Follow-up for Type 2 Diabetes   History of Present Illness:          Date of diagnosis of type 2 diabetes mellitus: 2009         Previous history: She had been on various oral hypoglycemic drugs in the past and was on relatively large doses of basal insulin. 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 she was able to stop insulin A1c had gone down to 6% in 2018 but was followed only about once a year She was tried on Metformin alone in 2018 for about a year but this was then stopped  Recent history:    Non-insulin hypoglycemic drugs the patient is taking are: Mounjaro 5mg  weekly   A1c is more recently around 6%  Current management, blood sugar patterns and problems identified: She has excellent blood sugar control he was 5 mg Mounjaro that was started in 08/2020  However she continues to lose weight although this has been related to decreased appetite from other health problems and more recently severe constipation  She says she has not had to taste for certain foods like sweets  Has been regular with her injections No nausea from [facility] Usually trying to limit portions of carbohydrates Blood sugars are excellent after meals Only 1 she had a high reading of 243 after eating cake As before occasionally will have high readings fasting on the lab glucose was 104 She is not able to do much walking because of leg pain        Side effects from medications have been: Nausea, some diarrhea from Metformin, some nausea from glipizide       Typical meal intake: Breakfast is usually having only almonds.  Will have chicken and vegetables at dinner, snacks will be cheese crackers.  Usually not eating out              Glucose monitoring:  done 1+ times a day         Glucometer: Contour next.       Blood Glucose readings by time of day and averages from meter download:   PRE-MEAL A.m. Lunch Afternoon 5 pm-12 AM Overall  Glucose range: 96-137  89-243 75-111   Mean/median:  114 133 98 114   Previously  PRE-MEAL Fasting Lunch After noon Evening Overall  Glucose range: 92-111    69-178  Mean/median: 101  124 103 112   Previously:  Dietician visit, most recent: 2017,  Weight history:  Wt Readings from Last 3 Encounters:  04/21/21 145 lb 12.8 oz (66.1 kg)  04/15/21 148 lb 8 oz (67.4 kg)  03/19/21 154 lb 4 oz (70 kg)    Glycemic control:   Lab Results  Component Value Date   HGBA1C 5.3 07/02/2021   HGBA1C 5.9 04/14/2021   HGBA1C 5.5 03/15/2021   Lab Results  Component Value Date   MICROALBUR 1.3 12/15/2020   LDLCALC 30 03/15/2021   CREATININE 0.72 07/02/2021   Lab Results  Component Value Date   MICRALBCREAT 0.6 12/15/2020    Lab Results  Component Value Date   FRUCTOSAMINE 222 09/10/2020   FRUCTOSAMINE 263 03/19/2019    No visits with results within  1 Week(s) from this visit.  Latest known visit with results is:  Lab on 07/02/2021  Component Date Value Ref Range Status   Sodium 07/02/2021 140  135 - 145 mEq/L Final   Potassium 07/02/2021 3.8  3.5 - 5.1 mEq/L Final   Chloride 07/02/2021 105  96 - 112 mEq/L Final   CO2 07/02/2021 30  19 - 32 mEq/L Final   Glucose, Bld 07/02/2021 98  70 - 99 mg/dL Final   BUN 32/95/1884 11  6 - 23 mg/dL Final   Creatinine, Ser 07/02/2021 0.72  0.40 - 1.20 mg/dL Final   GFR 16/60/6301 90.17  >60.00 mL/min Final   Calculated using the CKD-EPI Creatinine Equation (2021)   Calcium 07/02/2021 9.1  8.4 - 10.5 mg/dL Final   Hgb S0F MFr Bld 07/02/2021 5.3  4.6 - 6.5 % Final   Glycemic Control Guidelines for People with Diabetes:Non Diabetic:  <6%Goal of Therapy: <7%Additional Action Suggested:  >8%     Allergies as of 07/13/2021        Reactions   Morphine And Related Nausea And Vomiting   Pt states also caused arm to turn black where medication was administered    Morphine Other (See Comments)   Prednisone Hives   Other Nausea And Vomiting   TYLOX   Pantoprazole Hives, Rash        Medication List        Accurate as of July 12, 2021  9:54 PM. If you have any questions, ask your nurse or doctor.          ALPRAZolam 0.5 MG tablet Commonly known as: XANAX TAKE 1 TABLET(0.5 MG) BY MOUTH TWICE DAILY AS NEEDED FOR ANXIETY   amoxicillin-clavulanate 875-125 MG tablet Commonly known as: AUGMENTIN Take 1 tablet by mouth 2 (two) times daily.   b complex vitamins capsule Take 1 capsule by mouth daily.   Biotin 09323 MCG Tbdp Take 1 capsule by mouth daily.   Contour Next Test test strip Generic drug: glucose blood USE ONCE DAILY   cyclobenzaprine 10 MG tablet Commonly known as: FLEXERIL TAKE 1 TABLET(10 MG) BY MOUTH AT BEDTIME   fluticasone 50 MCG/ACT nasal spray Commonly known as: FLONASE Place 2 sprays into both nostrils daily.   gabapentin 300 MG capsule Commonly known as: NEURONTIN TAKE 2 CAPSULES BY MOUTH THREE TIMES DAILY   linaclotide 72 MCG capsule Commonly known as: LINZESS Take 72 mcg by mouth daily before breakfast.   multivitamin Chew chewable tablet Chew 1 tablet by mouth daily.   phenazopyridine 200 MG tablet Commonly known as: PYRIDIUM Take 1 tablet (200 mg total) by mouth 3 (three) times daily.   promethazine 25 MG tablet Commonly known as: PHENERGAN TAKE 1 TABLET(25 MG) BY MOUTH EVERY 8 HOURS AS NEEDED FOR NAUSEA OR VOMITING   ramipril 5 MG capsule Commonly known as: ALTACE TAKE 1 CAPSULE(5 MG) BY MOUTH DAILY   rosuvastatin 5 MG tablet Commonly known as: CRESTOR TAKE 1 TABLET(5 MG) BY MOUTH DAILY   terconazole 0.4 % vaginal cream Commonly known as: TERAZOL 7 USE EXTERNALLY TWICE DAILY AS DIRECTED   tirzepatide 2.5 MG/0.5ML Pen Commonly known as: MOUNJARO Inject  2.5 mg into the skin once a week.   valACYclovir 500 MG tablet Commonly known as: VALTREX Take 1 tablet (500 mg total) by mouth 2 (two) times daily as needed (cold sores outbreak).        Allergies:  Allergies  Allergen Reactions   Morphine And Related Nausea And Vomiting  Pt states also caused arm to turn black where medication was administered    Morphine Other (See Comments)   Prednisone Hives   Other Nausea And Vomiting    TYLOX   Pantoprazole Hives and Rash    Past Medical History:  Diagnosis Date   Anxiety    Complication of anesthesia    Diabetes mellitus    GERD (gastroesophageal reflux disease)    Headache    no issues since stop caffeine   History of hiatal hernia    History of migraine headaches    History of urinary tract infection    last one 11/2014    History of vertigo    Hyperlipidemia    Hypertension    Neck pain, chronic    OSA (obstructive sleep apnea)    on CPAP   PONV (postoperative nausea and vomiting)    Psoriasis    Rosacea    Weak urinary stream     Past Surgical History:  Procedure Laterality Date   2D ECHOCARDIOGRAM  07/20/2011   EF >55%, normal   ABDOMINAL HYSTERECTOMY     APPENDECTOMY     CARDIOVASCULAR STRESS TEST  07/20/2011   Normal myocardial perfusion study, EKG negative for ischemia, no ECG changes   CHOLECYSTECTOMY     colonscopy      polyps removed    DIAGNOSTIC LAPAROSCOPY     DILATION AND CURETTAGE OF UTERUS     LAPAROSCOPIC ROUX-EN-Y GASTRIC BYPASS WITH HIATAL HERNIA REPAIR N/A 01/12/2015   Procedure: LAPAROSCOPIC ROUX-EN-Y GASTRIC BYPASS WITH LYSIS OF ADHESIONS ;  Surgeon: Luretha Murphy, MD;  Location: [facility];  Service: General;  Laterality: N/A;   LAPAROSCOPY  02/01/2017   Laparoscopic lysis of adhesions   LAPAROSCOPY N/A 02/01/2017   Procedure: LAPAROSCOPY Lysis of adhesions;  Surgeon: Berna Bue, MD;  Location: [facility];  Service: General;  Laterality: N/A;   LAPAROTOMY N/A 02/01/2017   Procedure: excision  of umbilical mass, small bowel resection;  Surgeon: Berna Bue, MD;  Location: [facility];  Service: General;  Laterality: N/A;   SPINE SURGERY     C6-C7 fusion x2   SPINE SURGERY     herniated disc L4-L5   TRIGGER FINGER RELEASE Left 03/14/2019   Procedure: RELEASE TRIGGER FINGER/A-1 PULLEY LEFT LONG FINGER;  Surgeon: Betha Loa, MD;  Location: [facility];  Service: Orthopedics;  Laterality: Left;  Bier block   TUBAL LIGATION      Family History  Problem Relation Age of Onset   Diabetes Mother    Heart disease Mother    Hypertension Mother    Lung disease Father    Heart disease Father    Hypertension Father    Diabetes Father    Hypertension Sister    Hyperlipidemia Brother    Hypertension Brother    Heart attack Maternal Grandmother    Heart disease Maternal Grandmother    Hypertension Maternal Grandfather    Diabetes Maternal Grandfather    Heart disease Maternal Grandfather     Social History:  reports that she quit smoking about 38 years ago. Her smoking use included cigarettes. She has a 20.00 pack-year smoking history. She has never used smokeless tobacco. She reports that she does not drink alcohol and does not use drugs.   Review of Systems  Lipid history: Has been treated for cardiovascular risk reduction with Crestor 5 mg daily    Lab Results  Component Value Date   CHOL 96 03/15/2021   HDL  51 03/15/2021   LDLCALC 30 03/15/2021   TRIG 72 03/15/2021   CHOLHDL 1.9 03/15/2021           Hypertension: Has been on ramipril 5 mg daily prescribed by PCP  Home readings being checked periodically   BP Readings from Last 3 Encounters:  05/31/21 125/79  04/21/21 138/72  04/15/21 (!) 148/80    Most recent eye exam was in 12/20  Neuropathy: She has only mild tingling or pains in her feet Most recent foot exam: 12/22  Currently known complications of diabetes: None known  OSTEOPENIA: T score is -1.4 at the hip Takes Vitamin D  bid  LABS:  No visits with results within 1 Week(s) from this visit.  Latest known visit with results is:  Lab on 07/02/2021  Component Date Value Ref Range Status   Sodium 07/02/2021 140  135 - 145 mEq/L Final   Potassium 07/02/2021 3.8  3.5 - 5.1 mEq/L Final   Chloride 07/02/2021 105  96 - 112 mEq/L Final   CO2 07/02/2021 30  19 - 32 mEq/L Final   Glucose, Bld 07/02/2021 98  70 - 99 mg/dL Final   BUN 76/16/0737 11  6 - 23 mg/dL Final   Creatinine, Ser 07/02/2021 0.72  0.40 - 1.20 mg/dL Final   GFR 10/62/6948 90.17  >60.00 mL/min Final   Calculated using the CKD-EPI Creatinine Equation (2021)   Calcium 07/02/2021 9.1  8.4 - 10.5 mg/dL Final   Hgb N4O MFr Bld 07/02/2021 5.3  4.6 - 6.5 % Final   Glycemic Control Guidelines for People with Diabetes:Non Diabetic:  <6%Goal of Therapy: <7%Additional Action Suggested:  >8%     Physical Examination:  There were no vitals taken for this visit.    ASSESSMENT:  Diabetes type 2 non-insulin-dependent  See history of present illness for detailed discussion of current diabetes management, blood sugar patterns and problems identified   A1c is significantly better and right around 6% again  She has done much better with Mounjaro compared to Victoza As before tends to have relatively high readings fasting compared to after meals  Weight loss: This is multifactorial although Greggory Keen may be playing a role in Considering her BMI of 25 does not need to lose any further weight  OSTEOPENIA: Will need follow-up  Vitamin D supplementation: Her levels are high normal and she can reduce her vitamin D to once a day instead of twice a day  PLAN:    Trial of 2.5 mg MOUNJARO instead of 5 mg to prevent any further weight loss She will try to continue to watch her portions and carbohydrates Encouraged her to exercise as much as tolerated with various activities To call if blood sugars are not consistently higher Discussed blood sugar targets  fasting and after meals  Bone density will be ordered  Follow-up in 3 months    There are no Patient Instructions on file for this visit.     Reather Littler 07/12/2021, 9:54 PM   Note: This office note was prepared with Dragon voice recognition system technology. Any transcriptional errors that result from this process are unintentional.

## 2021-07-12 NOTE — Telephone Encounter (Signed)
Mounjaro sample given

## 2021-07-12 NOTE — Telephone Encounter (Signed)
Patient given sample of the Monjaro 2.5mg  until her PA is processed through insurance

## 2021-07-13 ENCOUNTER — Ambulatory Visit: Payer: BC Managed Care – PPO | Admitting: Endocrinology

## 2021-07-13 ENCOUNTER — Encounter: Payer: Self-pay | Admitting: Endocrinology

## 2021-07-13 VITALS — BP 142/64 | HR 70 | Ht 64.75 in | Wt 146.0 lb

## 2021-07-13 DIAGNOSIS — M85859 Other specified disorders of bone density and structure, unspecified thigh: Secondary | ICD-10-CM | POA: Diagnosis not present

## 2021-07-13 DIAGNOSIS — I10 Essential (primary) hypertension: Secondary | ICD-10-CM | POA: Diagnosis not present

## 2021-07-13 DIAGNOSIS — E1142 Type 2 diabetes mellitus with diabetic polyneuropathy: Secondary | ICD-10-CM

## 2021-07-28 ENCOUNTER — Other Ambulatory Visit: Payer: Self-pay | Admitting: Internal Medicine

## 2021-08-07 ENCOUNTER — Other Ambulatory Visit: Payer: Self-pay | Admitting: Endocrinology

## 2021-08-07 DIAGNOSIS — E1165 Type 2 diabetes mellitus with hyperglycemia: Secondary | ICD-10-CM

## 2021-08-17 ENCOUNTER — Other Ambulatory Visit: Payer: Self-pay | Admitting: Internal Medicine

## 2021-09-20 ENCOUNTER — Other Ambulatory Visit: Payer: BC Managed Care – PPO

## 2021-09-20 DIAGNOSIS — E785 Hyperlipidemia, unspecified: Secondary | ICD-10-CM | POA: Diagnosis not present

## 2021-09-20 DIAGNOSIS — E119 Type 2 diabetes mellitus without complications: Secondary | ICD-10-CM

## 2021-09-20 DIAGNOSIS — R5383 Other fatigue: Secondary | ICD-10-CM

## 2021-09-20 DIAGNOSIS — E1169 Type 2 diabetes mellitus with other specified complication: Secondary | ICD-10-CM

## 2021-09-20 DIAGNOSIS — I10 Essential (primary) hypertension: Secondary | ICD-10-CM

## 2021-09-20 DIAGNOSIS — F411 Generalized anxiety disorder: Secondary | ICD-10-CM

## 2021-09-21 ENCOUNTER — Other Ambulatory Visit: Payer: BC Managed Care – PPO

## 2021-09-21 LAB — LIPID PANEL
Cholesterol: 105 mg/dL (ref <200)
HDL: 55 mg/dL (ref >=50)
LDL Cholesterol (Calc): 36 mg/dL (calc)
Non-HDL Cholesterol (Calc): 50 mg/dL (calc) (ref <130)
Total CHOL/HDL Ratio: 1.9 (calc) (ref <5.0)
Triglycerides: 55 mg/dL (ref <150)

## 2021-09-21 LAB — CBC WITH DIFFERENTIAL/PLATELET
Absolute Monocytes: 339 cells/uL (ref 200–950)
Basophils Absolute: 20 cells/uL (ref 0–200)
Basophils Relative: 0.5 %
Eosinophils Absolute: 59 cells/uL (ref 15–500)
Eosinophils Relative: 1.5 %
HCT: 34.7 % — ABNORMAL LOW (ref 35.0–45.0)
Hemoglobin: 11.9 g/dL (ref 11.7–15.5)
Lymphs Abs: 1373 cells/uL (ref 850–3900)
MCH: 31.6 pg (ref 27.0–33.0)
MCHC: 34.3 g/dL (ref 32.0–36.0)
MCV: 92.3 fL (ref 80.0–100.0)
MPV: 10 fL (ref 7.5–12.5)
Monocytes Relative: 8.7 %
Neutro Abs: 2110 cells/uL (ref 1500–7800)
Neutrophils Relative %: 54.1 %
Platelets: 247 10*3/uL (ref 140–400)
RBC: 3.76 10*6/uL — ABNORMAL LOW (ref 3.80–5.10)
RDW: 12.9 % (ref 11.0–15.0)
Total Lymphocyte: 35.2 %
WBC: 3.9 10*3/uL (ref 3.8–10.8)

## 2021-09-21 LAB — COMPLETE METABOLIC PANEL WITH GFR
AG Ratio: 1.8 (calc) (ref 1.0–2.5)
ALT: 16 U/L (ref 6–29)
AST: 23 U/L (ref 10–35)
Albumin: 4.2 g/dL (ref 3.6–5.1)
Alkaline phosphatase (APISO): 81 U/L (ref 37–153)
BUN: 13 mg/dL (ref 7–25)
CO2: 30 mmol/L (ref 20–32)
Calcium: 9.3 mg/dL (ref 8.6–10.4)
Chloride: 103 mmol/L (ref 98–110)
Creat: 0.66 mg/dL (ref 0.50–1.05)
Globulin: 2.4 g/dL (calc) (ref 1.9–3.7)
Glucose, Bld: 91 mg/dL (ref 65–99)
Potassium: 4.4 mmol/L (ref 3.5–5.3)
Sodium: 139 mmol/L (ref 135–146)
Total Bilirubin: 0.6 mg/dL (ref 0.2–1.2)
Total Protein: 6.6 g/dL (ref 6.1–8.1)
eGFR: 100 mL/min/{1.73_m2} (ref >=60)

## 2021-09-21 LAB — HEMOGLOBIN A1C
Hgb A1c MFr Bld: 5.4 % of total Hgb (ref <5.7)
Mean Plasma Glucose: 108 mg/dL
eAG (mmol/L): 6 mmol/L

## 2021-09-21 LAB — TSH: TSH: 1.37 mIU/L (ref 0.40–4.50)

## 2021-09-23 ENCOUNTER — Ambulatory Visit: Payer: BC Managed Care – PPO | Admitting: Internal Medicine

## 2021-09-23 ENCOUNTER — Encounter: Payer: Self-pay | Admitting: Internal Medicine

## 2021-09-23 VITALS — BP 124/74 | HR 86 | Temp 98.2°F | Ht 64.75 in | Wt 136.4 lb

## 2021-09-23 DIAGNOSIS — I10 Essential (primary) hypertension: Secondary | ICD-10-CM

## 2021-09-23 DIAGNOSIS — Z23 Encounter for immunization: Secondary | ICD-10-CM | POA: Diagnosis not present

## 2021-09-23 DIAGNOSIS — Z8659 Personal history of other mental and behavioral disorders: Secondary | ICD-10-CM | POA: Diagnosis not present

## 2021-09-23 DIAGNOSIS — Z87440 Personal history of urinary (tract) infections: Secondary | ICD-10-CM | POA: Diagnosis not present

## 2021-09-23 DIAGNOSIS — E1169 Type 2 diabetes mellitus with other specified complication: Secondary | ICD-10-CM | POA: Diagnosis not present

## 2021-09-23 DIAGNOSIS — E785 Hyperlipidemia, unspecified: Secondary | ICD-10-CM

## 2021-09-23 DIAGNOSIS — E119 Type 2 diabetes mellitus without complications: Secondary | ICD-10-CM

## 2021-09-23 DIAGNOSIS — Z6822 Body mass index (BMI) 22.0-22.9, adult: Secondary | ICD-10-CM

## 2021-09-23 LAB — POCT URINALYSIS DIPSTICK
Bilirubin, UA: NEGATIVE
Blood, UA: POSITIVE
Glucose, UA: NEGATIVE
Ketones, UA: NEGATIVE
Leukocytes, UA: NEGATIVE
Nitrite, UA: NEGATIVE
Protein, UA: NEGATIVE
Spec Grav, UA: 1.01 (ref 1.010–1.025)
Urobilinogen, UA: 0.2 E.U./dL
pH, UA: 6.5 (ref 5.0–8.0)

## 2021-09-23 MED ORDER — PROCTOFOAM HC 1-1 % EX FOAM: 1.0000 | Freq: Two times a day (BID) | CUTANEOUS | 3 refills | Status: DC

## 2021-09-23 MED ORDER — CEPHALEXIN 500 MG PO CAPS: 500.0000 mg | ORAL_CAPSULE | Freq: Three times a day (TID) | ORAL | 0 refills | Status: DC

## 2021-09-23 NOTE — Patient Instructions (Addendum)
May take Keflex 500 mg 3 times a day for 7 days should urinary symptoms persist.  Labs are stable.  Please follow-up with Dr. Dwyane Dee in November.  Flu vaccine given today.  Return in 6 months for health maintenance exam.Proctofoam refilled per pt. Request.

## 2021-09-23 NOTE — Progress Notes (Signed)
   Subjective:    Patient ID: Melissa Benson, female    DOB: May 30, 1960, 61 y.o.   MRN: [id number]  HPI 61 year old Female seen for 6 month follow up. Sees Dr. Dwyane Dee in November for diabetic follow-up.  Says Accu-Cheks have been stable.  Thinks she may be getting a urinary infection.  Has suprapubic pressure.  However urine specimen today is normal.  She is going on vacation next week and is anxious about getting a UTI so she was given prescription today for cephalexin 500 mg 3 times a day for the 7 days should she have persistent symptoms.  A culture was not done today because urine dipstick was normal.  Her lipids are normal.  TSH is normal.  CBC is basically normal with normal white count and hemoglobin.  Normal platelet count.  Glucose is normal at 91.  BUN and creatinine are normal.  Estimated GFR is 100 cc/min serum proteins and liver functions are normal.   Review of Systems she  has no headache, chills, fever, nausea or vomiting.     Objective:   Physical Exam Blood pressure 124/74 pulse 86 temperature 98.2 degrees weight 136 pounds 6.4 ounces BMI 22.87  Skin: Warm and dry.  No adenopathy in the cervical area.  Chest clear.  Cardiac exam regular rate and rhythm. Ext: without edema.     Assessment & Plan:  Type 2 diabetes mellitus-seen by Dr. Dwyane Dee and has upcoming appointment in November.  Hemoglobin A1c not checked with this appointment  Suprapubic pressure-history of recurrent urinary infections.  Prescribed Keflex 500 mg 3 times a day for 7 days as she is going out of town.  May start this medication if symptoms persist but urine dipstick today is normal.  History of anxiety and situational stress treated with Xanax  HTN- stable on low dose Ramipril-BP improved with weight loss  Hyperlipidemia treated with Crestor 5 mg daily and lipids are within normal limits.  Plan: May take Keflex if urine symptoms persist Labs are stable. See Dr. Dwyane Dee in November. RTC in 6 months  for health maintenance exam. Flu vaccine given.

## 2021-10-01 ENCOUNTER — Other Ambulatory Visit: Payer: Self-pay | Admitting: Endocrinology

## 2021-10-01 DIAGNOSIS — E1165 Type 2 diabetes mellitus with hyperglycemia: Secondary | ICD-10-CM

## 2021-10-08 ENCOUNTER — Telehealth: Payer: Self-pay | Admitting: Internal Medicine

## 2021-10-08 MED ORDER — PROMETHAZINE HCL 25 MG PO TABS
ORAL_TABLET | ORAL | 1 refills | Status: DC
Start: 2021-10-08 — End: 2022-03-03

## 2021-10-08 MED ORDER — CYCLOBENZAPRINE HCL 10 MG PO TABS
ORAL_TABLET | ORAL | 2 refills | Status: DC
Start: 2021-10-08 — End: 2022-07-28

## 2021-10-08 MED ORDER — RAMIPRIL 5 MG PO CAPS
ORAL_CAPSULE | ORAL | 3 refills | Status: DC
Start: 2021-10-08 — End: 2022-09-22

## 2021-10-08 NOTE — Telephone Encounter (Addendum)
Received Fax RX request from  [facility] [place] - [address] AT [place] Phone:  [phone]  Fax:  [phone]      Medication - ramipril (ALTACE) 5 MG capsule Last Refill - 07/14/2021  Medication - promethazine (PHENERGAN) 25 MG tablet Last refill - 06/22/2021  Medication - cyclobenzaprine (FLEXERIL) 10 MG tablet Last Refill - 08/14/2021   Last OV - 09/23/2021  Last CPE - 03/19/2021  Next Appointment - 03/22/2022    10:30 pm Have refilled these Rx requests MJB, MD

## 2021-10-22 ENCOUNTER — Telehealth: Payer: Self-pay | Admitting: Internal Medicine

## 2021-10-22 ENCOUNTER — Ambulatory Visit: Payer: BC Managed Care – PPO | Admitting: Internal Medicine

## 2021-10-22 ENCOUNTER — Ambulatory Visit
Admission: RE | Admit: 2021-10-22 | Discharge: 2021-10-22 | Disposition: A | Discharge status: Home / Self Care | Payer: BC Managed Care – PPO | Source: Ambulatory Visit | Attending: Internal Medicine | Admitting: Internal Medicine

## 2021-10-22 ENCOUNTER — Encounter: Payer: Self-pay | Admitting: Internal Medicine

## 2021-10-22 VITALS — BP 104/66 | HR 74 | Temp 98.2°F | Ht 64.75 in | Wt 139.4 lb

## 2021-10-22 DIAGNOSIS — R059 Cough, unspecified: Secondary | ICD-10-CM | POA: Diagnosis not present

## 2021-10-22 DIAGNOSIS — E119 Type 2 diabetes mellitus without complications: Secondary | ICD-10-CM

## 2021-10-22 DIAGNOSIS — R051 Acute cough: Secondary | ICD-10-CM | POA: Diagnosis not present

## 2021-10-22 DIAGNOSIS — J22 Unspecified acute lower respiratory infection: Secondary | ICD-10-CM | POA: Diagnosis not present

## 2021-10-22 MED ORDER — METHYLPREDNISOLONE ACETATE 80 MG/ML IJ SUSP
80.0000 mg | Freq: Once | INTRAMUSCULAR | Status: AC
  Administered 2021-10-22: 80 mg via INTRAMUSCULAR

## 2021-10-22 MED ORDER — HYDROCODONE BIT-HOMATROP MBR 5-1.5 MG/5ML PO SOLN: 5.0000 mL | Freq: Three times a day (TID) | ORAL | 0 refills | Status: DC | PRN

## 2021-10-22 MED ORDER — AMOXICILLIN-POT CLAVULANATE 500-125 MG PO TABS: 1.0000 | ORAL_TABLET | Freq: Three times a day (TID) | ORAL | 0 refills | Status: DC

## 2021-10-22 NOTE — Progress Notes (Signed)
   Subjective:    Patient ID: Melissa Benson, female    DOB: 12-14-1960, 61 y.o.   MRN: [id number]  HPI  At the end of September, she went to [place].  for a vacation. She and husband took the  tram some during visit for transportation.  Patient came down with cough around September 26th. Was in the rain some at [facility] in [place]. No fever but had chills and body aches.Did not test for Covid until got home.  She was gone about 6 days. Has tested negative for Covid several times. Has headaches intermittently. Cough is mostly dry. Chest is hurting, and I think this is due to due to coughing.  She had flu vaccine September 21.  Review of Systems see above-she also has type 2 diabetes mellitus and is followed by Dr. Dwyane Dee.     Objective:   Physical Exam She is afebrile.  Blood pressure 104/66 pulse 74 temperature 98.2 degrees pulse oximetry 99% weight 139 pounds 6.4 ounces BMI 23.38 Her respiratory rate is normal.  Skin: Warm and dry.  No cervical adenopathy.  TMs clear.  Pharynx is clear.  Neck supple.      Assessment & Plan:  Protracted cough  Acute lower respiratory infection  Anxiety  Patient is anxious about continuing to have cough for prolonged period of time  Plan: Chest x-ray ordered.  Prescribed Hycodan 1 teaspoon every 8 hours as needed for cough.  Prescribed Augmentin 500 mg to take by mouth 3 times daily for 7 days.  Depo Medrol 80 mg IM given in office today.  Addendum: Chest x-ray shows no acute cardiopulmonary process and patient has been informed of result today.  She is to call if not improving within 7 days.

## 2021-10-22 NOTE — Patient Instructions (Addendum)
Please have CXR today. Take Augmentin as directed with food. Take hydrocodone cough medication sparingly for cough. Call back if not better in 7-10 days.  Depo-Medrol 80 mg IM given in office today.

## 2021-10-22 NOTE — Telephone Encounter (Signed)
Melissa Benson [phone]  Cristel called to say she has had a dry cough for 3 weeks it started out with scratchy throat, runny nose, fever, she has done 3 COVID test that was all negative. Schedule her to come in at 12:30

## 2021-11-03 ENCOUNTER — Other Ambulatory Visit (INDEPENDENT_AMBULATORY_CARE_PROVIDER_SITE_OTHER): Payer: BC Managed Care – PPO

## 2021-11-03 DIAGNOSIS — E1142 Type 2 diabetes mellitus with diabetic polyneuropathy: Secondary | ICD-10-CM | POA: Diagnosis not present

## 2021-11-03 DIAGNOSIS — M85859 Other specified disorders of bone density and structure, unspecified thigh: Secondary | ICD-10-CM

## 2021-11-03 LAB — BASIC METABOLIC PANEL
BUN: 12 mg/dL (ref 6–23)
CO2: 31 mEq/L (ref 19–32)
Calcium: 9.5 mg/dL (ref 8.4–10.5)
Chloride: 102 mEq/L (ref 96–112)
Creatinine, Ser: 0.64 mg/dL (ref 0.40–1.20)
GFR: 95.08 mL/min (ref >60.00)
Glucose, Bld: 104 mg/dL — ABNORMAL HIGH (ref 70–99)
Potassium: 3.8 mEq/L (ref 3.5–5.1)
Sodium: 139 mEq/L (ref 135–145)

## 2021-11-03 LAB — VITAMIN D 25 HYDROXY (VIT D DEFICIENCY, FRACTURES): VITD: 73.47 ng/mL (ref 30.00–100.00)

## 2021-11-03 LAB — MICROALBUMIN / CREATININE URINE RATIO
Creatinine,U: 77.8 mg/dL
Microalb Creat Ratio: 0.9 mg/g (ref 0.0–30.0)
Microalb, Ur: <0.7 mg/dL (ref 0.0–1.9)

## 2021-11-03 LAB — HEMOGLOBIN A1C: Hgb A1c MFr Bld: 5.7 % (ref 4.6–6.5)

## 2021-11-08 ENCOUNTER — Ambulatory Visit: Payer: BC Managed Care – PPO | Admitting: Endocrinology

## 2021-11-10 ENCOUNTER — Ambulatory Visit: Payer: BC Managed Care – PPO | Admitting: Endocrinology

## 2021-11-12 ENCOUNTER — Ambulatory Visit
Admission: EM | Admit: 2021-11-12 | Discharge: 2021-11-12 | Disposition: A | Discharge status: Home / Self Care | Payer: BC Managed Care – PPO | Attending: Nurse Practitioner | Admitting: Nurse Practitioner

## 2021-11-12 DIAGNOSIS — Z1152 Encounter for screening for COVID-19: Secondary | ICD-10-CM | POA: Diagnosis not present

## 2021-11-12 DIAGNOSIS — B349 Viral infection, unspecified: Secondary | ICD-10-CM

## 2021-11-12 DIAGNOSIS — J029 Acute pharyngitis, unspecified: Secondary | ICD-10-CM

## 2021-11-12 LAB — RESP PANEL BY RT-PCR (FLU A&B, COVID) ARPGX2
Influenza A by PCR: NEGATIVE
Influenza B by PCR: NEGATIVE
SARS Coronavirus 2 by RT PCR: NEGATIVE

## 2021-11-12 LAB — POCT RAPID STREP A (OFFICE): Rapid Strep A Screen: NEGATIVE

## 2021-11-12 NOTE — ED Triage Notes (Signed)
Pt states sore throat and headache since last night.

## 2021-11-12 NOTE — ED Provider Notes (Signed)
[facility]    CSN: [id number] Arrival date & time: 11/12/21  1714      History   Chief Complaint Chief Complaint  Patient presents with   Sore Throat    HPI Melissa Benson is a 61 y.o. female.   The history is provided by the patient.   Patient presents with a 1 day history of sore throat, headache, and sneezing.  She denies fever, chills, nasal congestion, runny nose, cough, nausea, vomiting, or diarrhea.  Patient states that some of her coworkers and her family have been sick with both strep and COVID.  States that she took Tylenol for her symptoms.  Denies history of seasonal allergies.  Past Medical History:  Diagnosis Date   Anxiety    Complication of anesthesia    Diabetes mellitus    GERD (gastroesophageal reflux disease)    Headache    no issues since stop caffeine   History of hiatal hernia    History of migraine headaches    History of urinary tract infection    last one 11/2014    History of vertigo    Hyperlipidemia    Hypertension    Neck pain, chronic    OSA (obstructive sleep apnea)    on CPAP   PONV (postoperative nausea and vomiting)    Psoriasis    Rosacea    Weak urinary stream     Patient Active Problem List   Diagnosis Date Noted   Chronic pancreatitis, unspecified pancreatitis type (HCC) 09/16/2020   Family history of colon cancer 01/29/2018   History of total abdominal hysterectomy 01/29/2018   Family history of Alzheimer's disease 01/29/2018   Family history of breast cancer 11/21/2017   Diabetes mellitus (HCC) 11/21/2017   S/P exploratory laparotomy 02/02/2017   Internal hernia 02/02/2017   Chronic daily headache 07/27/2015   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   Controlled diabetes mellitus type II without complication (HCC) 11/10/2014   Obesity 10/15/2012   Hyperlipidemia 09/26/2012   Chronic neck pain 12/04/2010   History of migraine headaches 12/04/2010   E11.9 06/03/2010   Hypertension 06/03/2010    OSA on CPAP 06/03/2010    Past Surgical History:  Procedure Laterality Date   2D ECHOCARDIOGRAM  07/20/2011   EF >55%, normal   ABDOMINAL HYSTERECTOMY     APPENDECTOMY     CARDIOVASCULAR STRESS TEST  07/20/2011   Normal myocardial perfusion study, EKG negative for ischemia, no ECG changes   CHOLECYSTECTOMY     colonscopy      polyps removed    DIAGNOSTIC LAPAROSCOPY     DILATION AND CURETTAGE OF UTERUS     LAPAROSCOPIC ROUX-EN-Y GASTRIC BYPASS WITH HIATAL HERNIA REPAIR N/A 01/12/2015   Procedure: LAPAROSCOPIC ROUX-EN-Y GASTRIC BYPASS WITH LYSIS OF ADHESIONS ;  Surgeon: Luretha Murphy, MD;  Location: [facility];  Service: General;  Laterality: N/A;   LAPAROSCOPY  02/01/2017   Laparoscopic lysis of adhesions   LAPAROSCOPY N/A 02/01/2017   Procedure: LAPAROSCOPY Lysis of adhesions;  Surgeon: Berna Bue, MD;  Location: [facility];  Service: General;  Laterality: N/A;   LAPAROTOMY N/A 02/01/2017   Procedure: excision of umbilical mass, small bowel resection;  Surgeon: Berna Bue, MD;  Location: [facility];  Service: General;  Laterality: N/A;   SPINE SURGERY     C6-C7 fusion x2   SPINE SURGERY     herniated disc L4-L5   TRIGGER FINGER RELEASE Left 03/14/2019   Procedure: RELEASE TRIGGER FINGER/A-1  PULLEY LEFT LONG FINGER;  Surgeon: Betha Loa, MD;  Location: [facility];  Service: Orthopedics;  Laterality: Left;  Bier block   TUBAL LIGATION      OB History   No obstetric history on file.      Home Medications    Prior to Admission medications   Medication Sig Start Date End Date Taking? Authorizing Provider  ALPRAZolam (XANAX) 0.5 MG tablet TAKE 1 TABLET(0.5 MG) BY MOUTH TWICE DAILY AS NEEDED FOR ANXIETY 08/18/21   Margaree Mackintosh, MD  amoxicillin-clavulanate (AUGMENTIN) 500-125 MG tablet Take 1 tablet by mouth 3 (three) times daily. 10/22/21   Margaree Mackintosh, MD  b complex vitamins capsule Take 1 capsule by mouth daily.    [provider]  Biotin 53976 MCG  TBDP Take 1 capsule by mouth daily.     [provider]  BIOTIN PO     [provider]  CONTOUR NEXT TEST test strip USE ONCE DAILY 12/15/20   Reather Littler, MD  cyclobenzaprine (FLEXERIL) 10 MG tablet TAKE 1 TABLET(10 MG) BY MOUTH AT BEDTIME 10/08/21   Margaree Mackintosh, MD  fluticasone (FLONASE) 50 MCG/ACT nasal spray Place 2 sprays into both nostrils daily. 11/13/20   Domenick Gong, MD  gabapentin (NEURONTIN) 300 MG capsule TAKE 2 CAPSULES BY MOUTH THREE TIMES DAILY 06/15/21   Margaree Mackintosh, MD  HYDROcodone bit-homatropine (HYCODAN) 5-1.5 MG/5ML syrup Take 5 mLs by mouth every 8 (eight) hours as needed for cough. 10/22/21   Margaree Mackintosh, MD  linaclotide (LINZESS) 72 MCG capsule Take 72 mcg by mouth at bedtime.    [provider]  MOUNJARO 5 MG/0.5ML Pen INJECT 5 MG UNDER THE SKIN 1 TIME A WEEK 10/04/21   Reather Littler, MD  multivitamin (VIT Lorel Monaco C) CHEW chewable tablet Chew 1 tablet by mouth daily.    [provider]  promethazine (PHENERGAN) 25 MG tablet One tab every 8 hours as needed for nausea 10/08/21   Margaree Mackintosh, MD  ramipril (ALTACE) 5 MG capsule One tab daily 10/08/21   Margaree Mackintosh, MD  rosuvastatin (CRESTOR) 5 MG tablet TAKE 1 TABLET(5 MG) BY MOUTH DAILY 01/12/21   Margaree Mackintosh, MD  terconazole (TERAZOL 7) 0.4 % vaginal cream USE EXTERNALLY TWICE DAILY AS DIRECTED 02/09/20   Margaree Mackintosh, MD  valACYclovir (VALTREX) 500 MG tablet Take 1 tablet (500 mg total) by mouth 2 (two) times daily as needed (cold sores outbreak). 03/19/21   Margaree Mackintosh, MD    Family History Family History  Problem Relation Age of Onset   Diabetes Mother    Heart disease Mother    Hypertension Mother    Lung disease Father    Heart disease Father    Hypertension Father    Diabetes Father    Hypertension Sister    Hyperlipidemia Brother    Hypertension Brother    Heart attack Maternal Grandmother    Heart disease Maternal Grandmother    Hypertension Maternal  Grandfather    Diabetes Maternal Grandfather    Heart disease Maternal Grandfather     Social History Social History   Tobacco Use   Smoking status: Former    Packs/day: 2.50    Years: 8.00    Total pack years: 20.00    Types: Cigarettes    Quit date: 05/18/1983    Years since quitting: 38.5   Smokeless tobacco: Never  Vaping Use   Vaping Use: Never used  Substance  Use Topics   Alcohol use: No    Alcohol/week: 0.0 standard drinks of alcohol   Drug use: No     Allergies   Morphine and related, Morphine, Prednisone, Other, and Pantoprazole   Review of Systems Review of Systems Per HPI  Physical Exam Triage Vital Signs ED Triage Vitals [11/12/21 1743]  Enc Vitals Group     BP 126/70     Pulse Rate 87     Resp 16     Temp 98.1 F (36.7 C)     Temp Source Oral     SpO2 98 %     Weight      Height      Head Circumference      Peak Flow      Pain Score 5     Pain Loc      Pain Edu?      Excl. in GC?    No data found.  Updated Vital Signs BP 126/70 (BP Location: Right Arm)   Pulse 87   Temp 98.1 F (36.7 C) (Oral)   Resp 16   SpO2 98%   Visual Acuity Right Eye Distance:   Left Eye Distance:   Bilateral Distance:    Right Eye Near:   Left Eye Near:    Bilateral Near:     Physical Exam Vitals and nursing note reviewed.  Constitutional:      General: She is not in acute distress.    Appearance: She is well-developed.  HENT:     Head: Normocephalic.     Right Ear: Tympanic membrane and ear canal normal.     Left Ear: Tympanic membrane and ear canal normal.     Nose: No congestion.     Right Turbinates: Enlarged and swollen.     Left Turbinates: Enlarged and swollen.     Right Sinus: No maxillary sinus tenderness or frontal sinus tenderness.     Left Sinus: No maxillary sinus tenderness or frontal sinus tenderness.     Mouth/Throat:     Lips: Pink.     Mouth: Mucous membranes are moist.     Pharynx: Pharyngeal swelling and posterior  oropharyngeal erythema present.     Tonsils: 1+ on the right. 1+ on the left.  Eyes:     Conjunctiva/sclera: Conjunctivae normal.     Pupils: Pupils are equal, round, and reactive to light.  Cardiovascular:     Rate and Rhythm: Normal rate and regular rhythm.     Heart sounds: Normal heart sounds.  Pulmonary:     Effort: Pulmonary effort is normal.     Breath sounds: Normal breath sounds. No wheezing.  Abdominal:     General: Bowel sounds are normal.     Palpations: Abdomen is soft.     Tenderness: There is no abdominal tenderness.  Musculoskeletal:     Cervical back: Normal range of motion.  Lymphadenopathy:     Cervical: No cervical adenopathy.  Skin:    General: Skin is warm and dry.  Neurological:     General: No focal deficit present.     Mental Status: She is alert and oriented to person, place, and time.  Psychiatric:        Mood and Affect: Mood normal.        Behavior: Behavior normal.      UC Treatments / Results  Labs (all labs ordered are listed, but only abnormal results are displayed) Labs Reviewed  CULTURE, GROUP A STREP [facility])  RESP  PANEL BY RT-PCR (FLU A&B, COVID) [id number]  POCT RAPID STREP A (OFFICE)    EKG   Radiology No results found.  Procedures Procedures (including critical care time)  Medications Ordered in UC Medications - No data to display  Initial Impression / Assessment and Plan / UC Course  I have reviewed the triage vital signs and the nursing notes.  Pertinent labs & imaging results that were available during my care of the patient were reviewed by me and considered in my medical decision making (see chart for details).  Patient presents with a 1 day history of sore throat, headache, and sneezing.  Patient's vital signs are stable, she is in no acute distress, she is well-appearing.  Rapid strep test is negative./Flu and throat culture are pending.  Supportive care recommendations were provided to the patient to include  over-the-counter analgesics such as Tylenol or ibuprofen, soft diet, and warm salt water rinses.  Patient would like antiviral therapy her COVID test is positive.  Patient is a candidate to receive the peer reviewer as she currently takes a statin for her cholesterol.  Return precautions were provided to the patient.  Patient verbalizes understanding.  All questions were answered.  Patient is stable for discharge. Final Clinical Impressions(s) / UC Diagnoses   Final diagnoses:  Viral illness     Discharge Instructions      The rapid strep test is negative.  A COVID/flu test and throat culture are pending.  If the results of the test are positive, you will be contacted.  As discussed, if your COVID test is positive, you are a candidate to receive molnupiravir as an antiviral treatment. Continue use of Tylenol or ibuprofen as needed for pain or discomfort. Warm salt water gargles 3-4 times daily while symptoms persist. Recommend isolation until your pending test results are received. Recommend a soft diet while throat symptoms persist. Follow-up with your primary care physician if symptoms fail to improve over the next 7 to 10 days. Follow-up as needed.     ED Prescriptions   None    PDMP not reviewed this encounter.   Abran Cantor, NP 11/12/21 1812

## 2021-11-12 NOTE — Discharge Instructions (Signed)
The rapid strep test is negative.  A COVID/flu test and throat culture are pending.  If the results of the test are positive, you will be contacted.  As discussed, if your COVID test is positive, you are a candidate to receive molnupiravir as an antiviral treatment. Continue use of Tylenol or ibuprofen as needed for pain or discomfort. Warm salt water gargles 3-4 times daily while symptoms persist. Recommend isolation until your pending test results are received. Recommend a soft diet while throat symptoms persist. Follow-up with your primary care physician if symptoms fail to improve over the next 7 to 10 days. Follow-up as needed.

## 2021-11-15 LAB — CULTURE, GROUP A STREP (THRC)

## 2021-11-29 ENCOUNTER — Other Ambulatory Visit: Payer: Self-pay | Admitting: Endocrinology

## 2021-11-29 DIAGNOSIS — E1165 Type 2 diabetes mellitus with hyperglycemia: Secondary | ICD-10-CM

## 2021-12-02 ENCOUNTER — Other Ambulatory Visit: Payer: Self-pay

## 2021-12-02 MED ORDER — VALACYCLOVIR HCL 500 MG PO TABS: 500.0000 mg | ORAL_TABLET | Freq: Two times a day (BID) | ORAL | 3 refills | Status: DC | PRN

## 2021-12-25 DIAGNOSIS — Z1152 Encounter for screening for COVID-19: Secondary | ICD-10-CM | POA: Diagnosis not present

## 2021-12-25 DIAGNOSIS — J101 Influenza due to other identified influenza virus with other respiratory manifestations: Secondary | ICD-10-CM | POA: Diagnosis not present

## 2021-12-25 DIAGNOSIS — R051 Acute cough: Secondary | ICD-10-CM | POA: Diagnosis not present

## 2021-12-25 DIAGNOSIS — J111 Influenza due to unidentified influenza virus with other respiratory manifestations: Secondary | ICD-10-CM | POA: Diagnosis not present

## 2021-12-25 DIAGNOSIS — R509 Fever, unspecified: Secondary | ICD-10-CM | POA: Diagnosis not present

## 2022-01-06 ENCOUNTER — Encounter: Payer: Self-pay | Admitting: Endocrinology

## 2022-01-06 ENCOUNTER — Ambulatory Visit: Payer: BC Managed Care – PPO | Admitting: Endocrinology

## 2022-01-06 VITALS — BP 140/82 | HR 82 | Ht 65.75 in | Wt 139.0 lb

## 2022-01-06 DIAGNOSIS — E7800 Pure hypercholesterolemia, unspecified: Secondary | ICD-10-CM | POA: Diagnosis not present

## 2022-01-06 DIAGNOSIS — E119 Type 2 diabetes mellitus without complications: Secondary | ICD-10-CM

## 2022-01-06 NOTE — Progress Notes (Signed)
Patient ID: Melissa Benson, female   DOB: 01-02-1961, 62 y.o.   MRN: [id number]           Reason for Appointment: Follow-up for Type 2 Diabetes   History of Present Illness:          Date of diagnosis of type 2 diabetes mellitus: 2009         Previous history: She had been on various oral hypoglycemic drugs in the past and was on relatively large doses of basal insulin. 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 she was able to stop insulin A1c had gone down to 6% in 2018 but was followed only about once a year She was tried on Metformin alone in 2018 for about a year but this was then stopped  Recent history:    Non-insulin hypoglycemic drugs the patient is taking are: Mounjaro 5 mg weekly   A1c is under 6% consistently Last 5.7, 2 months ago  Current management, blood sugar patterns and problems identified: She is continuing to take 5 mg Mounjaro with good results Although she thinks that sometimes he does feel increased hunger her weight has maintained She is checking blood sugars mostly in the afternoon which show variability Unclear why she has occasional readings in the 60s but these are without symptoms  Blood sugars are generally not over 150 after lunch when checked although her main meal is at dinnertime  Currently not exercising, previously was regular with this No nausea with Mounjaro        Side effects from medications have been: Nausea, some diarrhea from Metformin, some nausea from glipizide      Typical meal intake: Breakfast is usually yogurt, eggs.  Will have chicken and vegetables at dinner, snacks will be cheese crackers.  Usually not eating out             Glucose monitoring:  done 1+ times a day         Glucometer: Contour next.       Blood Glucose readings by time of day and averages from  meter download:   PRE-MEAL Fasting Lunch Dinner Bedtime Overall  Glucose range: 93, 105    64-167  Mean/median:     116   POST-MEAL PC Breakfast PC Lunch PC Dinner  Glucose range:  64-167   Mean/median:  116    Previously  PRE-MEAL Fasting Lunch Dinner Bedtime Overall  Glucose range:     78-180  Mean/median: 110    115   POST-MEAL PC Breakfast PC Lunch PC Dinner  Glucose range:     Mean/median:   124    Dietician visit, most recent: 2017,  Weight history:  Wt Readings from Last 3 Encounters:  01/06/22 139 lb (63 kg)  10/22/21 139 lb 6.4 oz (63.2 kg)  09/23/21 136 lb 6.4 oz (61.9 kg)    Glycemic control:   Lab Results  Component Value Date   HGBA1C 5.7 11/03/2021   HGBA1C 5.4 09/20/2021   HGBA1C 5.3 07/02/2021   Lab Results  Component Value Date   MICROALBUR <0.7 11/03/2021   LDLCALC 36 09/20/2021   CREATININE 0.64 11/03/2021   Lab Results  Component Value Date   MICRALBCREAT 0.9 11/03/2021    Lab Results  Component Value Date   FRUCTOSAMINE 222 09/10/2020   FRUCTOSAMINE 263 03/19/2019  No visits with results within 1 Week(s) from this visit.  Latest known visit with results is:  Admission on 11/12/2021, Discharged on 11/12/2021  Component Date Value Ref Range Status   Rapid Strep A Screen 11/12/2021 Negative  Negative Final   Specimen Description 11/12/2021    Final                   Value:THROAT Performed at [facility], [address] [place], [place] [address]    Special Requests 11/12/2021    Final                   Value:NONE Performed at [facility], [address] [place], [place] [address]    Culture 11/12/2021    Final                   Value:NO GROUP A STREP (S.PYOGENES) ISOLATED Performed at [facility] [address], [place], [place] [address]    Report Status 11/12/2021 11/15/2021 FINAL   Final   SARS Coronavirus 2 by RT PCR 11/12/2021 NEGATIVE  NEGATIVE Final   Comment: (NOTE) SARS-CoV-2 target nucleic acids are  NOT DETECTED.  The SARS-CoV-2 RNA is generally detectable in upper respiratory specimens during the acute phase of infection. The lowest concentration of SARS-CoV-2 viral copies this assay can detect is 138 copies/mL. A negative result does not preclude SARS-Cov-2 infection and should not be used as the sole basis for treatment or other patient management decisions. A negative result may occur with  improper specimen collection/handling, submission of specimen other than nasopharyngeal swab, presence of viral mutation(s) within the areas targeted by this assay, and inadequate number of viral copies(<138 copies/mL). A negative result must be combined with clinical observations, patient history, and epidemiological information. The expected result is Negative.  Fact Sheet for Patients:  EntrepreneurPulse.com.au  Fact Sheet for Healthcare Providers:  IncredibleEmployment.be  This test is no                          t yet approved or cleared by the [place] FDA and  has been authorized for detection and/or diagnosis of SARS-CoV-2 by FDA under an Emergency Use Authorization (EUA). This EUA will remain  in effect (meaning this test can be used) for the duration of the COVID-19 declaration under Section 564(b)(1) of the Act, 21 U.S.C.section 360bbb-3(b)(1), unless the authorization is terminated  or revoked sooner.       Influenza A by PCR 11/12/2021 NEGATIVE  NEGATIVE Final   Influenza B by PCR 11/12/2021 NEGATIVE  NEGATIVE Final   Comment: (NOTE) The Xpert Xpress SARS-CoV-2/FLU/RSV plus assay is intended as an aid in the diagnosis of influenza from Nasopharyngeal swab specimens and should not be used as a sole basis for treatment. Nasal washings and aspirates are unacceptable for Xpert Xpress SARS-CoV-2/FLU/RSV testing.  Fact Sheet for Patients: EntrepreneurPulse.com.au  Fact Sheet for Healthcare  Providers: IncredibleEmployment.be  This test is not yet approved or cleared by the [place] FDA and has been authorized for detection and/or diagnosis of SARS-CoV-2 by FDA under an Emergency Use Authorization (EUA). This EUA will remain in effect (meaning this test can be used) for the duration of the COVID-19 declaration under Section 564(b)(1) of the Act, 21 U.S.C. section 360bbb-3(b)(1), unless the authorization is terminated or revoked.  Performed at [facility], [place] [address], [place],  [address]     Allergies as of 01/06/2022  Reactions   Morphine And Related Nausea And Vomiting   Pt states also caused arm to turn black where medication was administered    Morphine Other (See Comments)   Prednisone Hives   Other Nausea And Vomiting   TYLOX   Pantoprazole Hives, Rash        Medication List        Accurate as of January 06, 2022 11:53 AM. If you have any questions, ask your nurse or doctor.          ALPRAZolam 0.5 MG tablet Commonly known as: XANAX TAKE 1 TABLET(0.5 MG) BY MOUTH TWICE DAILY AS NEEDED FOR ANXIETY   amoxicillin-clavulanate 500-125 MG tablet Commonly known as: Augmentin Take 1 tablet by mouth 3 (three) times daily.   b complex vitamins capsule Take 1 capsule by mouth daily.   Biotin 10000 MCG Tbdp Take 1 capsule by mouth daily.   BIOTIN PO   Contour Next Test test strip Generic drug: glucose blood USE ONCE DAILY   cyclobenzaprine 10 MG tablet Commonly known as: FLEXERIL TAKE 1 TABLET(10 MG) BY MOUTH AT BEDTIME   fluticasone 50 MCG/ACT nasal spray Commonly known as: FLONASE Place 2 sprays into both nostrils daily.   gabapentin 300 MG capsule Commonly known as: NEURONTIN TAKE 2 CAPSULES BY MOUTH THREE TIMES DAILY   HYDROcodone bit-homatropine 5-1.5 MG/5ML syrup Commonly known as: HYCODAN Take 5 mLs by mouth every 8 (eight) hours as needed for cough.   linaclotide 72 MCG  capsule Commonly known as: LINZESS Take 72 mcg by mouth at bedtime.   Mounjaro 5 MG/0.5ML Pen Generic drug: tirzepatide INJECT 5 MG(0.5 ML) UNDER THE SKIN ONCE A WEEK   multivitamin Chew chewable tablet Chew 1 tablet by mouth daily.   promethazine 25 MG tablet Commonly known as: PHENERGAN One tab every 8 hours as needed for nausea   ramipril 5 MG capsule Commonly known as: ALTACE One tab daily   rosuvastatin 5 MG tablet Commonly known as: CRESTOR TAKE 1 TABLET(5 MG) BY MOUTH DAILY   terconazole 0.4 % vaginal cream Commonly known as: TERAZOL 7 USE EXTERNALLY TWICE DAILY AS DIRECTED   valACYclovir 500 MG tablet Commonly known as: VALTREX Take 1 tablet (500 mg total) by mouth 2 (two) times daily as needed (cold sores outbreak).        Allergies:  Allergies  Allergen Reactions   Morphine And Related Nausea And Vomiting    Pt states also caused arm to turn black where medication was administered    Morphine Other (See Comments)   Prednisone Hives   Other Nausea And Vomiting    TYLOX   Pantoprazole Hives and Rash    Past Medical History:  Diagnosis Date   Anxiety    Complication of anesthesia    Diabetes mellitus    GERD (gastroesophageal reflux disease)    Headache    no issues since stop caffeine   History of hiatal hernia    History of migraine headaches    History of urinary tract infection    last one 11/2014    History of vertigo    Hyperlipidemia    Hypertension    Neck pain, chronic    OSA (obstructive sleep apnea)    on CPAP   PONV (postoperative nausea and vomiting)    Psoriasis    Rosacea    Weak urinary stream     Past Surgical History:  Procedure Laterality Date   2D ECHOCARDIOGRAM  07/20/2011   EF >55%, normal  ABDOMINAL HYSTERECTOMY     APPENDECTOMY     CARDIOVASCULAR STRESS TEST  07/20/2011   Normal myocardial perfusion study, EKG negative for ischemia, no ECG changes   CHOLECYSTECTOMY     colonscopy      polyps removed     DIAGNOSTIC LAPAROSCOPY     DILATION AND CURETTAGE OF UTERUS     LAPAROSCOPIC ROUX-EN-Y GASTRIC BYPASS WITH HIATAL HERNIA REPAIR N/A 01/12/2015   Procedure: LAPAROSCOPIC ROUX-EN-Y GASTRIC BYPASS WITH LYSIS OF ADHESIONS ;  Surgeon: Johnathan Hausen, MD;  Location: [facility];  Service: General;  Laterality: N/A;   LAPAROSCOPY  02/01/2017   Laparoscopic lysis of adhesions   LAPAROSCOPY N/A 02/01/2017   Procedure: LAPAROSCOPY Lysis of adhesions;  Surgeon: Clovis Riley, MD;  Location: [facility];  Service: General;  Laterality: N/A;   LAPAROTOMY N/A 02/01/2017   Procedure: excision of umbilical mass, small bowel resection;  Surgeon: Clovis Riley, MD;  Location: [facility];  Service: General;  Laterality: N/A;   SPINE SURGERY     C6-C7 fusion x2   SPINE SURGERY     herniated disc L4-L5   TRIGGER FINGER RELEASE Left 03/14/2019   Procedure: RELEASE TRIGGER FINGER/A-1 PULLEY LEFT LONG FINGER;  Surgeon: Leanora Cover, MD;  Location: [facility];  Service: Orthopedics;  Laterality: Left;  Bier block   TUBAL LIGATION      Family History  Problem Relation Age of Onset   Diabetes Mother    Heart disease Mother    Hypertension Mother    Lung disease Father    Heart disease Father    Hypertension Father    Diabetes Father    Hypertension Sister    Hyperlipidemia Brother    Hypertension Brother    Heart attack Maternal Grandmother    Heart disease Maternal Grandmother    Hypertension Maternal Grandfather    Diabetes Maternal Grandfather    Heart disease Maternal Grandfather     Social History:  reports that she quit smoking about 38 years ago. Her smoking use included cigarettes. She has a 20.00 pack-year smoking history. She has never used smokeless tobacco. She reports that she does not drink alcohol and does not use drugs.   Review of Systems  Lipid history: Has been treated for cardiovascular risk reduction with Crestor 5 mg daily Followed by PCP   Lab Results  Component Value  Date   CHOL 105 09/20/2021   HDL 55 09/20/2021   LDLCALC 36 09/20/2021   TRIG 55 09/20/2021   CHOLHDL 1.9 09/20/2021           Hypertension: Has been on ramipril 5 mg daily prescribed by PCP  Home readings being checked periodically and usually readings are better at home  BP Readings from Last 3 Encounters:  01/06/22 (!) 140/82  11/12/21 126/70  10/22/21 104/66    Most recent eye exam was in 5/23  Neuropathy: She has mild tingling or pains in her feet On gabapentin more for the back and her legs Most recent foot exam: 12/22  Currently known complications of diabetes: None known  OSTEOPENIA: T score is -1.3 at the hip and previously in 2021 was - 1.4 Takes Vitamin D bid   Physical Examination:  BP (!) 140/82   Pulse 82   Ht 5' 5.75" (1.67 m)   Wt 139 lb (63 kg)   SpO2 98%   BMI 22.61 kg/m     ASSESSMENT:  Diabetes type 2 non-insulin-dependent  See history of present illness  for detailed discussion of current diabetes management, blood sugar patterns and problems identified  A1c is better and 6% again  She has done very well with Mounjaro 5 mg weekly which she is tolerating Has maintained weight loss although can do better with diet and exercise consistently   OSTEOPENIA: Stable on recent bone density and she can continue calcium and vitamin D as recommended  Hypertension: Blood pressure is higher today but better at home   PLAN:    She can continue 5 mg Mounjaro as this is providing adequate control and maintenance of weight loss with current BMI about 23 Reminded her to start exercising either walking or treadmill or elliptical This should help maintain her weight or improve further Start monitoring blood sugar by rotation at different times  Follow-up in 4 months Continue follow-up with PCP regarding other issues    There are no Patient Instructions on file for this visit.     Melissa Benson 01/06/2022, 11:53 AM   Note: This office note was  prepared with Dragon voice recognition system technology. Any transcriptional errors that result from this process are unintentional.

## 2022-01-06 NOTE — Patient Instructions (Signed)
Check blood sugars on waking up 2-3  days a week  Also check blood sugars about 2 hours after meals and do this after different meals by rotation  Recommended blood sugar levels on waking up are 90-130 and about 2 hours after meal is 130-160  Please bring your blood sugar monitor to each visit, thank you  Exercise daily   

## 2022-01-10 ENCOUNTER — Other Ambulatory Visit: Payer: Self-pay | Admitting: Internal Medicine

## 2022-01-31 ENCOUNTER — Other Ambulatory Visit: Payer: Self-pay | Admitting: Endocrinology

## 2022-01-31 DIAGNOSIS — E1165 Type 2 diabetes mellitus with hyperglycemia: Secondary | ICD-10-CM

## 2022-03-03 ENCOUNTER — Other Ambulatory Visit: Payer: Self-pay | Admitting: Internal Medicine

## 2022-03-07 ENCOUNTER — Telehealth: Payer: Self-pay

## 2022-03-07 NOTE — Telephone Encounter (Signed)
Pt is having an issue getting her Mounjaro refilled. Its a pharmacy issue and she would have to pay $1000 out of pocket for a refill at this time. Pt is requesting a sample until they are able to get everything situated.

## 2022-03-08 NOTE — Telephone Encounter (Signed)
Lvm for pt to see if another pharmacy has Mounjaro in stock. We are out of samples at this time. Pt advised to call back in a few days to see if we have more.

## 2022-03-14 ENCOUNTER — Telehealth: Payer: Self-pay | Admitting: Internal Medicine

## 2022-03-14 NOTE — Telephone Encounter (Signed)
Melissa Benson [phone]  [name] called asking if she can start getting her labs done at [facility] office instead of here. She stated we would have to send the labs there so they could be done. She said she gets charged a copay when they are done here and she does not get charged a copay when they are done there.

## 2022-03-14 NOTE — Telephone Encounter (Signed)
After talking with [name] I called and spoke with a [employer] representative about benefits. I gave them the NPI for Dr Renold Genta and for [facility] labs and was told that as long as the labs was done in office and not inpatient hospital they should be covered 100% for preventive care, there is no copay. Labs do not have a copay when done in any doctors office especially when they are normal labs. She said it should not matter if they are done here or Dr Ronnie Derby office. Reference # [id number]

## 2022-03-15 ENCOUNTER — Other Ambulatory Visit: Payer: Self-pay | Admitting: Gastroenterology

## 2022-03-15 DIAGNOSIS — R1310 Dysphagia, unspecified: Secondary | ICD-10-CM

## 2022-03-15 DIAGNOSIS — K219 Gastro-esophageal reflux disease without esophagitis: Secondary | ICD-10-CM | POA: Diagnosis not present

## 2022-03-15 DIAGNOSIS — K5730 Diverticulosis of large intestine without perforation or abscess without bleeding: Secondary | ICD-10-CM | POA: Diagnosis not present

## 2022-03-15 DIAGNOSIS — K5904 Chronic idiopathic constipation: Secondary | ICD-10-CM | POA: Diagnosis not present

## 2022-03-15 NOTE — Progress Notes (Signed)
Patient Care Team: Elby Showers, MD as PCP - General  Visit Date: 03/22/22  Subjective:    Patient ID: Melissa Benson , Female   DOB: [birth year], 62 y.o.    MRN: [id number]   61 y.o. Female presents today for annual comprehensive physical exam. Patient has a past medical history of anxiety, Type 2 diabetes mellitus, GERD, hiatal hernia, UTI, vertigo, hyperlipidemia, hypertension, neck pain, obstructive sleep apnea, psoriasis, rosacea.  History of hyperlipidemia treated with Crestor 5 mg daily. Lipid panel normal on 03/21/22.  History of hypertension treated with Altace 5 mg daily. Blood pressure normal today at 120/68.  History of Type 2 diabetes mellitus treated with Mounjaro 5 mg weekly. Sees her endocrinologist, Dr. Elayne Snare, 05/11/22. HGBA1c at 5.6 on 03/21/22. She has gained two pounds since her last visit in January.  History of anxiety treated with Xanax 0.5 mg twice daily as needed.  History of neck pain treated with Flexeril 10 mg at bedtime.  Thyroid, kidney, liver function normal. Electrolytes normal. Glucose slightly elevated at 105 on 03/21/22.  Status post abdominal hysterectomy. Pelvic exam deferred to gynecologist.  Mammogram last completed 01/23/19. Repeat scheduled for 04/15/22.  Colonoscopy last completed 01/01/18. Diverticulosis in sigmoid colon, internal hemorrhoids. Examination otherwise normal. Recommended repeat in 2024.  DEXA scan last completed 04/28/21. T-score at -1.3 at right femoral neck. Patient has osteopenia.   Past Medical History:  Diagnosis Date   Anxiety    Complication of anesthesia    Diabetes mellitus    GERD (gastroesophageal reflux disease)    Headache    no issues since stop caffeine   History of hiatal hernia    History of migraine headaches    History of urinary tract infection    last one 11/2014    History of vertigo    Hyperlipidemia    Hypertension    Neck pain, chronic    OSA (obstructive sleep apnea)    on CPAP    PONV (postoperative nausea and vomiting)    Psoriasis    Rosacea    Weak urinary stream      Family History  Problem Relation Age of Onset   Diabetes Mother    Heart disease Mother    Hypertension Mother    Lung disease Father    Heart disease Father    Hypertension Father    Diabetes Father    Hypertension Sister    Hyperlipidemia Brother    Hypertension Brother    Heart attack Maternal Grandmother    Heart disease Maternal Grandmother    Hypertension Maternal Grandfather    Diabetes Maternal Grandfather    Heart disease Maternal Grandfather     Social History   Social History Narrative   Lives with husband and one of her children in a one story home.  Has 2 children.     Works as a Community education officer at [employer].  Education: college.      Review of Systems  Constitutional:  Negative for chills, fever, malaise/fatigue and weight loss.  HENT:  Negative for hearing loss, sinus pain and sore throat.   Respiratory:  Negative for cough and hemoptysis.   Cardiovascular:  Negative for chest pain, palpitations, leg swelling and PND.  Gastrointestinal:  Negative for abdominal pain, constipation, diarrhea, heartburn, nausea and vomiting.  Genitourinary:  Negative for dysuria, frequency and urgency.  Musculoskeletal:  Negative for back pain, myalgias and neck pain.  Skin:  Negative for itching and rash.  Neurological:  Negative  for dizziness, tingling, seizures and headaches.  Endo/Heme/Allergies:  Negative for polydipsia.  Psychiatric/Behavioral:  Negative for depression. The patient is not nervous/anxious.         Objective:   Vitals: BP 120/68   Pulse 72   Temp 97.6 F (36.4 C) (Tympanic)   Ht 5' 5.75" (1.67 m)   Wt 141 lb 12.8 oz (64.3 kg)   SpO2 99%   BMI 23.06 kg/m    Physical Exam Vitals and nursing note reviewed.  Constitutional:      General: She is not in acute distress.    Appearance: Normal appearance. She is not ill-appearing or toxic-appearing.  HENT:      Head: Normocephalic and atraumatic.     Right Ear: Hearing, tympanic membrane, ear canal and external ear normal.     Left Ear: Hearing, tympanic membrane, ear canal and external ear normal.     Mouth/Throat:     Pharynx: Oropharynx is clear.  Eyes:     Extraocular Movements: Extraocular movements intact.     Pupils: Pupils are equal, round, and reactive to light.  Neck:     Thyroid: No thyroid mass, thyromegaly or thyroid tenderness.     Vascular: No carotid bruit.  Cardiovascular:     Rate and Rhythm: Normal rate and regular rhythm. No extrasystoles are present.    Pulses: Normal pulses.          Dorsalis pedis pulses are 2+ on the right side and 2+ on the left side.       Posterior tibial pulses are 2+ on the right side and 2+ on the left side.     Heart sounds: Normal heart sounds. No murmur heard.    No friction rub. No gallop.  Pulmonary:     Effort: Pulmonary effort is normal.     Breath sounds: Normal breath sounds. No decreased breath sounds, wheezing, rhonchi or rales.  Chest:     Chest wall: No mass.  Abdominal:     Palpations: Abdomen is soft. There is no hepatomegaly, splenomegaly or mass.     Tenderness: There is no abdominal tenderness.     Hernia: No hernia is present.  Musculoskeletal:     Cervical back: Normal range of motion.     Right lower leg: No edema.     Left lower leg: No edema.  Lymphadenopathy:     Cervical: No cervical adenopathy.     Upper Body:     Right upper body: No supraclavicular adenopathy.     Left upper body: No supraclavicular adenopathy.  Skin:    General: Skin is warm and dry.  Neurological:     General: No focal deficit present.     Mental Status: She is alert and oriented to person, place, and time. Mental status is at baseline.     Sensory: Sensation is intact.     Motor: Motor function is intact. No weakness.     Deep Tendon Reflexes: Reflexes are normal and symmetric.  Psychiatric:        Attention and Perception:  Attention normal.        Mood and Affect: Mood normal.        Speech: Speech normal.        Behavior: Behavior normal.        Thought Content: Thought content normal.        Cognition and Memory: Cognition normal.        Judgment: Judgment normal.  Results:   Studies obtained and personally reviewed by me:  Mammogram last completed 01/23/19. Repeat scheduled for 04/15/22.  Colonoscopy last completed 01/01/18. Diverticulosis in sigmoid colon, internal hemorrhoids. Examination otherwise normal. Recommended repeat in 2024.  DEXA scan last completed 04/28/21. T-score at -1.3 at right femoral neck. Patient has osteopenia.   Labs:       Component Value Date/Time   NA 141 03/21/2022 0907   K 5.0 03/21/2022 0907   CL 105 03/21/2022 0907   CO2 29 03/21/2022 0907   GLUCOSE 105 (H) 03/21/2022 0907   BUN 10 03/21/2022 0907   CREATININE 0.66 03/21/2022 0907   CALCIUM 9.6 03/21/2022 0907   PROT 6.5 03/21/2022 0907   ALBUMIN 3.5 12/15/2019 1930   AST 23 03/21/2022 0907   ALT 20 03/21/2022 0907   ALKPHOS 72 12/15/2019 1930   BILITOT 0.5 03/21/2022 0907   GFRNONAA >60 12/15/2019 1930   GFRNONAA 81 12/03/2019 0905   GFRAA 94 12/03/2019 0905     Lab Results  Component Value Date   WBC 3.2 (L) 03/21/2022   HGB 12.2 03/21/2022   HCT 37.4 03/21/2022   MCV 94.0 03/21/2022   PLT 245 03/21/2022    Lab Results  Component Value Date   CHOL 113 03/21/2022   HDL 63 03/21/2022   LDLCALC 36 03/21/2022   TRIG 63 03/21/2022   CHOLHDL 1.8 03/21/2022    Lab Results  Component Value Date   HGBA1C 5.6 03/21/2022     Lab Results  Component Value Date   TSH 2.16 03/21/2022      Assessment & Plan:   Hyperlipidemia: treated with Crestor 5 mg daily. Lipid panel normal on 03/21/22.  She is treated with Altace 5 mg daily.  She does not have hypertension.  This medication is given for renal protection with diabetes.  Blood pressure normal today at 120/68.  Type 2 diabetes mellitus:  treated with Mounjaro 5 mg weekly. Sees her endocrinologist, Dr. Elayne Snare, 05/11/22. HGBA1c at 5.6 on 03/21/22. She has gained two pounds since her last visit in January.  Anxiety: treated with Xanax 0.5 mg twice daily as needed.  Neck pain: treated with Flexeril 10 mg at bedtime.  Pelvic exam deferred to gynecologist.  Has appointment in the near future  Mammogram: last completed 01/23/19. Repeat scheduled for 04/15/22.  Colonoscopy: last completed 01/01/18. Diverticulosis in sigmoid colon, internal hemorrhoids. Examination otherwise normal. Recommended repeat in 2024.  DEXA scan: last completed 04/28/21. T-score at -1.3 at right femoral neck. Patient has osteopenia.  Vaccine Counseling: UTD on flu, Covid-19, tetanus, shingles vaccines. Pneumococcal 20 vaccine administered.  Microalbumin/ creatinine urine test ordered and ratio is 6 which is normal   Return in 1 year for health maintenance exam or sooner if necessary.  Have upcoming GYN exam.    I,Alexander Ruley,acting as a scribe for Elby Showers, MD.,have documented all relevant documentation on the behalf of Elby Showers, MD,as directed by  Elby Showers, MD while in the presence of Elby Showers, MD.   I, Elby Showers, MD, have reviewed all documentation for this visit. The documentation on 03/23/22 for the exam, diagnosis, procedures, and orders are all accurate and complete.

## 2022-03-16 NOTE — Telephone Encounter (Signed)
Called and explained to patient that I had talked to her insurance company and she should not be having copay's for labs and that Dr Renold Genta wants to her labs for Dr Renold Genta done in our office and Dr Ronnie Derby labs done in his office.

## 2022-03-21 ENCOUNTER — Other Ambulatory Visit: Payer: BC Managed Care – PPO

## 2022-03-21 DIAGNOSIS — E785 Hyperlipidemia, unspecified: Secondary | ICD-10-CM | POA: Diagnosis not present

## 2022-03-21 DIAGNOSIS — I10 Essential (primary) hypertension: Secondary | ICD-10-CM

## 2022-03-21 DIAGNOSIS — E119 Type 2 diabetes mellitus without complications: Secondary | ICD-10-CM | POA: Diagnosis not present

## 2022-03-21 DIAGNOSIS — R5383 Other fatigue: Secondary | ICD-10-CM

## 2022-03-22 ENCOUNTER — Encounter: Payer: Self-pay | Admitting: Internal Medicine

## 2022-03-22 ENCOUNTER — Ambulatory Visit (INDEPENDENT_AMBULATORY_CARE_PROVIDER_SITE_OTHER): Payer: BC Managed Care – PPO | Admitting: Internal Medicine

## 2022-03-22 VITALS — BP 120/68 | HR 72 | Temp 97.6°F | Ht 65.75 in | Wt 141.8 lb

## 2022-03-22 DIAGNOSIS — Z8659 Personal history of other mental and behavioral disorders: Secondary | ICD-10-CM | POA: Diagnosis not present

## 2022-03-22 DIAGNOSIS — Z6823 Body mass index (BMI) 23.0-23.9, adult: Secondary | ICD-10-CM

## 2022-03-22 DIAGNOSIS — Z23 Encounter for immunization: Secondary | ICD-10-CM | POA: Diagnosis not present

## 2022-03-22 DIAGNOSIS — Z0000 Encounter for general adult medical examination without abnormal findings: Secondary | ICD-10-CM | POA: Diagnosis not present

## 2022-03-22 DIAGNOSIS — I10 Essential (primary) hypertension: Secondary | ICD-10-CM

## 2022-03-22 DIAGNOSIS — E119 Type 2 diabetes mellitus without complications: Secondary | ICD-10-CM | POA: Diagnosis not present

## 2022-03-22 DIAGNOSIS — E1169 Type 2 diabetes mellitus with other specified complication: Secondary | ICD-10-CM | POA: Diagnosis not present

## 2022-03-22 DIAGNOSIS — Z87440 Personal history of urinary (tract) infections: Secondary | ICD-10-CM

## 2022-03-22 DIAGNOSIS — E785 Hyperlipidemia, unspecified: Secondary | ICD-10-CM

## 2022-03-22 LAB — COMPLETE METABOLIC PANEL WITH GFR
AG Ratio: 2 (calc) (ref 1.0–2.5)
ALT: 20 U/L (ref 6–29)
AST: 23 U/L (ref 10–35)
Albumin: 4.3 g/dL (ref 3.6–5.1)
Alkaline phosphatase (APISO): 66 U/L (ref 37–153)
BUN: 10 mg/dL (ref 7–25)
CO2: 29 mmol/L (ref 20–32)
Calcium: 9.6 mg/dL (ref 8.6–10.4)
Chloride: 105 mmol/L (ref 98–110)
Creat: 0.66 mg/dL (ref 0.50–1.05)
Globulin: 2.2 g/dL (calc) (ref 1.9–3.7)
Glucose, Bld: 105 mg/dL — ABNORMAL HIGH (ref 65–99)
Potassium: 5 mmol/L (ref 3.5–5.3)
Sodium: 141 mmol/L (ref 135–146)
Total Bilirubin: 0.5 mg/dL (ref 0.2–1.2)
Total Protein: 6.5 g/dL (ref 6.1–8.1)
eGFR: 99 mL/min/{1.73_m2} (ref >=60)

## 2022-03-22 LAB — POCT URINALYSIS DIPSTICK
Bilirubin, UA: NEGATIVE
Blood, UA: NEGATIVE
Glucose, UA: NEGATIVE
Ketones, UA: NEGATIVE
Leukocytes, UA: NEGATIVE
Nitrite, UA: NEGATIVE
Protein, UA: NEGATIVE
Spec Grav, UA: 1.01 (ref 1.010–1.025)
Urobilinogen, UA: 0.2 E.U./dL
pH, UA: 6 (ref 5.0–8.0)

## 2022-03-22 LAB — CBC WITH DIFFERENTIAL/PLATELET
Absolute Monocytes: 246 cells/uL (ref 200–950)
Basophils Absolute: 19 cells/uL (ref 0–200)
Basophils Relative: 0.6 %
Eosinophils Absolute: 61 cells/uL (ref 15–500)
Eosinophils Relative: 1.9 %
HCT: 37.4 % (ref 35.0–45.0)
Hemoglobin: 12.2 g/dL (ref 11.7–15.5)
Lymphs Abs: 1446 cells/uL (ref 850–3900)
MCH: 30.7 pg (ref 27.0–33.0)
MCHC: 32.6 g/dL (ref 32.0–36.0)
MCV: 94 fL (ref 80.0–100.0)
MPV: 10.3 fL (ref 7.5–12.5)
Monocytes Relative: 7.7 %
Neutro Abs: 1427 cells/uL — ABNORMAL LOW (ref 1500–7800)
Neutrophils Relative %: 44.6 %
Platelets: 245 10*3/uL (ref 140–400)
RBC: 3.98 10*6/uL (ref 3.80–5.10)
RDW: 12.2 % (ref 11.0–15.0)
Total Lymphocyte: 45.2 %
WBC: 3.2 10*3/uL — ABNORMAL LOW (ref 3.8–10.8)

## 2022-03-22 LAB — HEMOGLOBIN A1C
Hgb A1c MFr Bld: 5.6 % of total Hgb (ref <5.7)
Mean Plasma Glucose: 114 mg/dL
eAG (mmol/L): 6.3 mmol/L

## 2022-03-22 LAB — LIPID PANEL
Cholesterol: 113 mg/dL (ref <200)
HDL: 63 mg/dL (ref >=50)
LDL Cholesterol (Calc): 36 mg/dL (calc)
Non-HDL Cholesterol (Calc): 50 mg/dL (calc) (ref <130)
Total CHOL/HDL Ratio: 1.8 (calc) (ref <5.0)
Triglycerides: 63 mg/dL (ref <150)

## 2022-03-22 LAB — TSH: TSH: 2.16 mIU/L (ref 0.40–4.50)

## 2022-03-22 NOTE — Patient Instructions (Addendum)
Keep GYN appt in the near future. I am pleased with you labs. Pneumococcal 20 vaccine given. Talked about Covid booster. RTC in one year or as needed. Continue follow up with Dr. Dwyane Dee. Colonoscopy is up to date.

## 2022-03-23 LAB — MICROALBUMIN / CREATININE URINE RATIO
Creatinine, Urine: 67 mg/dL (ref 20–275)
Microalb Creat Ratio: 6 mg/g creat (ref <30)
Microalb, Ur: 0.4 mg/dL

## 2022-03-30 NOTE — Telephone Encounter (Signed)
Patient was able to get the medication after about 3 weeks through [facility].

## 2022-04-02 ENCOUNTER — Other Ambulatory Visit: Payer: Self-pay | Admitting: Internal Medicine

## 2022-04-06 ENCOUNTER — Other Ambulatory Visit: Payer: Self-pay | Admitting: Endocrinology

## 2022-04-06 DIAGNOSIS — E1165 Type 2 diabetes mellitus with hyperglycemia: Secondary | ICD-10-CM

## 2022-04-07 ENCOUNTER — Other Ambulatory Visit: Payer: Self-pay

## 2022-04-07 DIAGNOSIS — E1165 Type 2 diabetes mellitus with hyperglycemia: Secondary | ICD-10-CM

## 2022-04-07 MED ORDER — MOUNJARO 5 MG/0.5ML ~~LOC~~ SOAJ: SUBCUTANEOUS | 0 refills | Status: DC

## 2022-04-15 DIAGNOSIS — Z6825 Body mass index (BMI) 25.0-25.9, adult: Secondary | ICD-10-CM | POA: Diagnosis not present

## 2022-04-15 DIAGNOSIS — Z01419 Encounter for gynecological examination (general) (routine) without abnormal findings: Secondary | ICD-10-CM | POA: Diagnosis not present

## 2022-04-15 DIAGNOSIS — Z1231 Encounter for screening mammogram for malignant neoplasm of breast: Secondary | ICD-10-CM | POA: Diagnosis not present

## 2022-04-15 DIAGNOSIS — N760 Acute vaginitis: Secondary | ICD-10-CM | POA: Diagnosis not present

## 2022-04-15 LAB — HM MAMMOGRAPHY

## 2022-04-29 ENCOUNTER — Telehealth: Payer: Self-pay

## 2022-04-29 NOTE — Telephone Encounter (Signed)
Patient called and LVM asking if there was an alternative rx that could be sent to replace her [facility] because she is unable to find a pharmacy that has it in stock.

## 2022-05-01 ENCOUNTER — Other Ambulatory Visit: Payer: Self-pay | Admitting: Internal Medicine

## 2022-05-03 MED ORDER — OZEMPIC (0.25 OR 0.5 MG/DOSE) 2 MG/3ML ~~LOC~~ SOPN: 0.5000 mg | PEN_INJECTOR | SUBCUTANEOUS | 2 refills | Status: DC

## 2022-05-03 NOTE — Addendum Note (Signed)
Addended by: Lisabeth Pick on: 05/03/2022 08:11 AM   Modules accepted: Orders

## 2022-05-03 NOTE — Telephone Encounter (Signed)
Script sent and patient notified.  

## 2022-05-04 ENCOUNTER — Other Ambulatory Visit: Payer: Self-pay

## 2022-05-04 MED ORDER — OZEMPIC (0.25 OR 0.5 MG/DOSE) 2 MG/3ML ~~LOC~~ SOPN: 0.5000 mg | PEN_INJECTOR | SUBCUTANEOUS | 2 refills | Status: DC

## 2022-05-09 ENCOUNTER — Other Ambulatory Visit: Payer: Self-pay | Admitting: Endocrinology

## 2022-05-10 ENCOUNTER — Other Ambulatory Visit (INDEPENDENT_AMBULATORY_CARE_PROVIDER_SITE_OTHER): Payer: BC Managed Care – PPO

## 2022-05-10 DIAGNOSIS — E7800 Pure hypercholesterolemia, unspecified: Secondary | ICD-10-CM

## 2022-05-10 DIAGNOSIS — E119 Type 2 diabetes mellitus without complications: Secondary | ICD-10-CM

## 2022-05-10 DIAGNOSIS — Z7985 Long-term (current) use of injectable non-insulin antidiabetic drugs: Secondary | ICD-10-CM

## 2022-05-10 LAB — COMPREHENSIVE METABOLIC PANEL
ALT: 24 U/L (ref 0–35)
AST: 28 U/L (ref 0–37)
Albumin: 4 g/dL (ref 3.5–5.2)
Alkaline Phosphatase: 63 U/L (ref 39–117)
BUN: 11 mg/dL (ref 6–23)
CO2: 30 mEq/L (ref 19–32)
Calcium: 9.2 mg/dL (ref 8.4–10.5)
Chloride: 105 mEq/L (ref 96–112)
Creatinine, Ser: 0.66 mg/dL (ref 0.40–1.20)
GFR: 94.04 mL/min (ref >60.00)
Glucose, Bld: 101 mg/dL — ABNORMAL HIGH (ref 70–99)
Potassium: 3.8 mEq/L (ref 3.5–5.1)
Sodium: 142 mEq/L (ref 135–145)
Total Bilirubin: 0.4 mg/dL (ref 0.2–1.2)
Total Protein: 6.4 g/dL (ref 6.0–8.3)

## 2022-05-10 LAB — HEMOGLOBIN A1C: Hgb A1c MFr Bld: 5.5 % (ref 4.6–6.5)

## 2022-05-10 LAB — LIPID PANEL
Cholesterol: 109 mg/dL (ref 0–200)
HDL: 56.5 mg/dL (ref >39.00)
LDL Cholesterol: 37 mg/dL (ref 0–99)
NonHDL: 52.02
Total CHOL/HDL Ratio: 2
Triglycerides: 73 mg/dL (ref 0.0–149.0)
VLDL: 14.6 mg/dL (ref 0.0–40.0)

## 2022-05-10 NOTE — Progress Notes (Unsigned)
Patient ID: Melissa Benson, female   DOB: Nov 19, 1960, 62 y.o.   MRN: [id number]           Reason for Appointment: Follow-up for Type 2 Diabetes   History of Present Illness:          Date of diagnosis of type 2 diabetes mellitus: 2009         Previous history: She had been on various oral hypoglycemic drugs in the past and was on relatively large doses of basal insulin. 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 she was able to stop insulin A1c had gone down to 6% in 2018 but was followed only about once a year She was tried on Metformin alone in 2018 for about a year but this was then stopped  Recent history:    Non-insulin hypoglycemic drugs the patient is taking are: Mounjaro 5 mg weekly   A1c is under 6% consistently Last 5.7, 2 months ago  Current management, blood sugar patterns and problems identified: She is continuing to take 5 mg Mounjaro with good results Although she thinks that sometimes he does feel increased hunger her weight has maintained She is checking blood sugars mostly in the afternoon which show variability Unclear why she has occasional readings in the 60s but these are without symptoms  Blood sugars are generally not over 150 after lunch when checked although her main meal is at dinnertime  Currently not exercising, previously was regular with this No nausea with Mounjaro        Side effects from medications have been: Nausea, some diarrhea from Metformin, some nausea from glipizide      Typical meal intake: Breakfast is usually yogurt, eggs.  Will have chicken and vegetables at dinner, snacks will be cheese crackers.  Usually not eating out             Glucose monitoring:  done 1+ times a day         Glucometer: Contour next.       Blood Glucose readings by time of day and averages from  meter download:   PRE-MEAL Fasting Lunch Dinner Bedtime Overall  Glucose range: 93, 105    64-167  Mean/median:     116   POST-MEAL PC Breakfast PC Lunch PC Dinner  Glucose range:  64-167   Mean/median:  116    Previously  PRE-MEAL Fasting Lunch Dinner Bedtime Overall  Glucose range:     78-180  Mean/median: 110    115   POST-MEAL PC Breakfast PC Lunch PC Dinner  Glucose range:     Mean/median:   124    Dietician visit, most recent: 2017,  Weight history:  Wt Readings from Last 3 Encounters:  03/22/22 141 lb 12.8 oz (64.3 kg)  01/06/22 139 lb (63 kg)  10/22/21 139 lb 6.4 oz (63.2 kg)    Glycemic control:   Lab Results  Component Value Date   HGBA1C 5.5 05/10/2022   HGBA1C 5.6 03/21/2022   HGBA1C 5.7 11/03/2021   Lab Results  Component Value Date   MICROALBUR 0.4 03/22/2022   LDLCALC 37 05/10/2022   CREATININE 0.66 05/10/2022   Lab Results  Component Value Date   MICRALBCREAT 6 03/22/2022    Lab Results  Component Value Date   FRUCTOSAMINE 222 09/10/2020   FRUCTOSAMINE 263 03/19/2019  Lab on 05/10/2022  Component Date Value Ref Range Status   Cholesterol 05/10/2022 109  0 - 200 mg/dL Final   ATP III Classification       Desirable:  < 200 mg/dL               Borderline High:  200 - 239 mg/dL          High:  > = 161 mg/dL   Triglycerides 09/60/4540 73.0  0.0 - 149.0 mg/dL Final   Normal:  <981 mg/dLBorderline High:  150 - 199 mg/dL   HDL 19/14/7829 56.21  >39.00 mg/dL Final   VLDL 30/86/5784 14.6  0.0 - 40.0 mg/dL Final   LDL Cholesterol 05/10/2022 37  0 - 99 mg/dL Final   Total CHOL/HDL Ratio 05/10/2022 2   Final                  Men          Women1/2 Average Risk     3.4          3.3Average Risk          5.0          4.42X Average Risk          9.6          7.13X Average Risk          15.0          11.0                       NonHDL 05/10/2022 52.02   Final   NOTE:  Non-HDL goal should be 30 mg/dL higher than patient's LDL goal (i.e. LDL goal of <  70 mg/dL, would have non-HDL goal of < 100 mg/dL)   Sodium 69/62/9528 413  135 - 145 mEq/L Final   Potassium 05/10/2022 3.8  3.5 - 5.1 mEq/L Final   Chloride 05/10/2022 105  96 - 112 mEq/L Final   CO2 05/10/2022 30  19 - 32 mEq/L Final   Glucose, Bld 05/10/2022 101 (H)  70 - 99 mg/dL Final   BUN 24/40/1027 11  6 - 23 mg/dL Final   Creatinine, Ser 05/10/2022 0.66  0.40 - 1.20 mg/dL Final   Total Bilirubin 05/10/2022 0.4  0.2 - 1.2 mg/dL Final   Alkaline Phosphatase 05/10/2022 63  39 - 117 U/L Final   AST 05/10/2022 28  0 - 37 U/L Final   ALT 05/10/2022 24  0 - 35 U/L Final   Total Protein 05/10/2022 6.4  6.0 - 8.3 g/dL Final   Albumin 25/36/6440 4.0  3.5 - 5.2 g/dL Final   GFR 34/74/2595 94.04  >60.00 mL/min Final   Calculated using the CKD-EPI Creatinine Equation (2021)   Calcium 05/10/2022 9.2  8.4 - 10.5 mg/dL Final   Hgb G3O MFr Bld 05/10/2022 5.5  4.6 - 6.5 % Final   Glycemic Control Guidelines for People with Diabetes:Non Diabetic:  <6%Goal of Therapy: <7%Additional Action Suggested:  >8%     Allergies as of 05/11/2022       Reactions   Morphine And Related Nausea And Vomiting   Pt states also caused arm to turn black where medication was administered    Morphine Other (See Comments)   Prednisone Hives   Other Nausea And Vomiting   TYLOX        Medication List        Accurate as of May 10, 2022  9:16 PM.  If you have any questions, ask your nurse or doctor.          ALPRAZolam 0.5 MG tablet Commonly known as: XANAX TAKE 1 TABLET(0.5 MG) BY MOUTH TWICE DAILY AS NEEDED FOR ANXIETY   b complex vitamins capsule Take 1 capsule by mouth daily.   Contour Next Test test strip Generic drug: glucose blood USE AS DIRECTED TO TEST BLOOD GLUCOSE ONCE DAILY   cyclobenzaprine 10 MG tablet Commonly known as: FLEXERIL TAKE 1 TABLET(10 MG) BY MOUTH AT BEDTIME What changed:  when to take this reasons to take this   fluticasone 50 MCG/ACT nasal spray Commonly known as:  FLONASE Place 2 sprays into both nostrils daily.   gabapentin 300 MG capsule Commonly known as: NEURONTIN TAKE 2 CAPSULES BY MOUTH THREE TIMES DAILY   HYDROcodone bit-homatropine 5-1.5 MG/5ML syrup Commonly known as: HYCODAN Take 5 mLs by mouth every 8 (eight) hours as needed for cough.   linaclotide 72 MCG capsule Commonly known as: LINZESS Take 72 mcg by mouth daily.   multivitamin Chew chewable tablet Chew 1 tablet by mouth daily.   Ozempic (0.25 or 0.5 MG/DOSE) 2 MG/3ML Sopn Generic drug: Semaglutide(0.25 or 0.5MG /DOS) Inject 0.5 mg into the skin once a week.   promethazine 25 MG tablet Commonly known as: PHENERGAN TAKE 1 TABLET BY MOUTH EVERY 8 HOURS AS NEEDED FOR NAUSEA   ramipril 5 MG capsule Commonly known as: ALTACE One tab daily   rosuvastatin 5 MG tablet Commonly known as: CRESTOR TAKE 1 TABLET(5 MG) BY MOUTH DAILY   terconazole 0.4 % vaginal cream Commonly known as: TERAZOL 7 USE EXTERNALLY TWICE DAILY AS DIRECTED   valACYclovir 500 MG tablet Commonly known as: VALTREX Take 1 tablet (500 mg total) by mouth 2 (two) times daily as needed (cold sores outbreak).        Allergies:  Allergies  Allergen Reactions   Morphine And Related Nausea And Vomiting    Pt states also caused arm to turn black where medication was administered    Morphine Other (See Comments)   Prednisone Hives   Other Nausea And Vomiting    TYLOX    Past Medical History:  Diagnosis Date   Anxiety    Complication of anesthesia    Diabetes mellitus    GERD (gastroesophageal reflux disease)    Headache    no issues since stop caffeine   History of hiatal hernia    History of migraine headaches    History of urinary tract infection    last one 11/2014    History of vertigo    Hyperlipidemia    Hypertension    Neck pain, chronic    OSA (obstructive sleep apnea)    on CPAP   PONV (postoperative nausea and vomiting)    Psoriasis    Rosacea    Weak urinary stream      Past Surgical History:  Procedure Laterality Date   2D ECHOCARDIOGRAM  07/20/2011   EF >55%, normal   ABDOMINAL HYSTERECTOMY     APPENDECTOMY     CARDIOVASCULAR STRESS TEST  07/20/2011   Normal myocardial perfusion study, EKG negative for ischemia, no ECG changes   CHOLECYSTECTOMY     colonscopy      polyps removed    DIAGNOSTIC LAPAROSCOPY     DILATION AND CURETTAGE OF UTERUS     LAPAROSCOPIC ROUX-EN-Y GASTRIC BYPASS WITH HIATAL HERNIA REPAIR N/A 01/12/2015   Procedure: LAPAROSCOPIC ROUX-EN-Y GASTRIC BYPASS WITH LYSIS OF ADHESIONS ;  Surgeon:  Luretha Murphy, MD;  Location: [facility];  Service: General;  Laterality: N/A;   LAPAROSCOPY  02/01/2017   Laparoscopic lysis of adhesions   LAPAROSCOPY N/A 02/01/2017   Procedure: LAPAROSCOPY Lysis of adhesions;  Surgeon: Berna Bue, MD;  Location: [facility];  Service: General;  Laterality: N/A;   LAPAROTOMY N/A 02/01/2017   Procedure: excision of umbilical mass, small bowel resection;  Surgeon: Berna Bue, MD;  Location: [facility];  Service: General;  Laterality: N/A;   SPINE SURGERY     C6-C7 fusion x2   SPINE SURGERY     herniated disc L4-L5   TRIGGER FINGER RELEASE Left 03/14/2019   Procedure: RELEASE TRIGGER FINGER/A-1 PULLEY LEFT LONG FINGER;  Surgeon: Betha Loa, MD;  Location: [facility];  Service: Orthopedics;  Laterality: Left;  Bier block   TUBAL LIGATION      Family History  Problem Relation Age of Onset   Diabetes Mother    Heart disease Mother    Hypertension Mother    Lung disease Father    Heart disease Father    Hypertension Father    Diabetes Father    Hypertension Sister    Hyperlipidemia Brother    Hypertension Brother    Heart attack Maternal Grandmother    Heart disease Maternal Grandmother    Hypertension Maternal Grandfather    Diabetes Maternal Grandfather    Heart disease Maternal Grandfather     Social History:  reports that she quit smoking about 39 years ago. Her smoking use  included cigarettes. She has a 20.00 pack-year smoking history. She has never used smokeless tobacco. She reports that she does not drink alcohol and does not use drugs.   Review of Systems  Lipid history: Has been treated for cardiovascular risk reduction with Crestor 5 mg daily Followed by PCP   Lab Results  Component Value Date   CHOL 109 05/10/2022   HDL 56.50 05/10/2022   LDLCALC 37 05/10/2022   TRIG 73.0 05/10/2022   CHOLHDL 2 05/10/2022           Hypertension: Has been on ramipril 5 mg daily prescribed by PCP  Home readings being checked periodically and usually readings are better at home  BP Readings from Last 3 Encounters:  03/22/22 120/68  01/06/22 (!) 140/82  11/12/21 126/70    Most recent eye exam was in 5/23  Neuropathy: She has mild tingling or pains in her feet On gabapentin more for the back and her legs Most recent foot exam: 12/22  Currently known complications of diabetes: None known  OSTEOPENIA: T score is -1.3 at the hip and previously in 2021 was - 1.4 Takes Vitamin D bid   Physical Examination:  There were no vitals taken for this visit.    ASSESSMENT:  Diabetes type 2 non-insulin-dependent  See history of present illness for detailed discussion of current diabetes management, blood sugar patterns and problems identified  A1c is better and 6% again  She has done very well with Mounjaro 5 mg weekly which she is tolerating Has maintained weight loss although can do better with diet and exercise consistently   OSTEOPENIA: Stable on recent bone density and she can continue calcium and vitamin D as recommended  Hypertension: Blood pressure is higher today but better at home   PLAN:    She can continue 5 mg Mounjaro as this is providing adequate control and maintenance of weight loss with current BMI about 23 Reminded her to start exercising  either walking or treadmill or elliptical This should help maintain her weight or improve  further Start monitoring blood sugar by rotation at different times  Follow-up in 4 months Continue follow-up with PCP regarding other issues    There are no Patient Instructions on file for this visit.     Reather Littler 05/10/2022, 9:16 PM   Note: This office note was prepared with Dragon voice recognition system technology. Any transcriptional errors that result from this process are unintentional.

## 2022-05-11 ENCOUNTER — Ambulatory Visit: Payer: BC Managed Care – PPO | Admitting: Endocrinology

## 2022-05-11 ENCOUNTER — Encounter: Payer: Self-pay | Admitting: Endocrinology

## 2022-05-11 VITALS — BP 122/70 | HR 82 | Ht 65.75 in | Wt 143.0 lb

## 2022-05-11 DIAGNOSIS — E119 Type 2 diabetes mellitus without complications: Secondary | ICD-10-CM

## 2022-05-11 DIAGNOSIS — Z7985 Long-term (current) use of injectable non-insulin antidiabetic drugs: Secondary | ICD-10-CM

## 2022-05-11 MED ORDER — TIRZEPATIDE 7.5 MG/0.5ML ~~LOC~~ SOAJ: 7.5000 mg | SUBCUTANEOUS | 3 refills | Status: DC

## 2022-06-15 ENCOUNTER — Other Ambulatory Visit: Payer: Self-pay

## 2022-06-15 NOTE — Telephone Encounter (Signed)
Patient called in requesting a new rx for a 3 month supply for Monjuro 7.5. Is this ok to refill?

## 2022-06-22 MED ORDER — TIRZEPATIDE 7.5 MG/0.5ML ~~LOC~~ SOAJ: 7.5000 mg | SUBCUTANEOUS | 1 refills | Status: DC

## 2022-06-28 ENCOUNTER — Other Ambulatory Visit: Payer: Self-pay | Admitting: Internal Medicine

## 2022-06-29 DIAGNOSIS — Z961 Presence of intraocular lens: Secondary | ICD-10-CM | POA: Diagnosis not present

## 2022-06-29 DIAGNOSIS — H40013 Open angle with borderline findings, low risk, bilateral: Secondary | ICD-10-CM | POA: Diagnosis not present

## 2022-06-29 DIAGNOSIS — H04123 Dry eye syndrome of bilateral lacrimal glands: Secondary | ICD-10-CM | POA: Diagnosis not present

## 2022-06-29 LAB — HM DIABETES EYE EXAM

## 2022-07-04 ENCOUNTER — Encounter: Payer: Self-pay | Admitting: Internal Medicine

## 2022-07-17 ENCOUNTER — Other Ambulatory Visit: Payer: Self-pay | Admitting: Internal Medicine

## 2022-07-19 ENCOUNTER — Encounter: Payer: Self-pay | Admitting: Internal Medicine

## 2022-07-28 ENCOUNTER — Other Ambulatory Visit: Payer: Self-pay | Admitting: Internal Medicine

## 2022-08-08 ENCOUNTER — Encounter: Payer: Self-pay | Admitting: Internal Medicine

## 2022-08-08 DIAGNOSIS — Z86010 Personal history of colonic polyps: Secondary | ICD-10-CM | POA: Diagnosis not present

## 2022-08-08 DIAGNOSIS — K5730 Diverticulosis of large intestine without perforation or abscess without bleeding: Secondary | ICD-10-CM | POA: Diagnosis not present

## 2022-08-08 DIAGNOSIS — Z09 Encounter for follow-up examination after completed treatment for conditions other than malignant neoplasm: Secondary | ICD-10-CM | POA: Diagnosis not present

## 2022-08-08 DIAGNOSIS — Z1211 Encounter for screening for malignant neoplasm of colon: Secondary | ICD-10-CM | POA: Diagnosis not present

## 2022-08-08 DIAGNOSIS — K648 Other hemorrhoids: Secondary | ICD-10-CM | POA: Diagnosis not present

## 2022-08-08 LAB — HM COLONOSCOPY

## 2022-08-25 ENCOUNTER — Other Ambulatory Visit (INDEPENDENT_AMBULATORY_CARE_PROVIDER_SITE_OTHER): Payer: BC Managed Care – PPO

## 2022-08-25 DIAGNOSIS — E119 Type 2 diabetes mellitus without complications: Secondary | ICD-10-CM

## 2022-08-25 LAB — HEMOGLOBIN A1C: Hgb A1c MFr Bld: 5.4 % (ref 4.6–6.5)

## 2022-08-25 LAB — BASIC METABOLIC PANEL
BUN: 9 mg/dL (ref 6–23)
CO2: 32 mEq/L (ref 19–32)
Calcium: 9.1 mg/dL (ref 8.4–10.5)
Chloride: 103 mEq/L (ref 96–112)
Creatinine, Ser: 0.73 mg/dL (ref 0.40–1.20)
GFR: 87.98 mL/min (ref >60.00)
Glucose, Bld: 110 mg/dL — ABNORMAL HIGH (ref 70–99)
Potassium: 3.6 mEq/L (ref 3.5–5.1)
Sodium: 141 mEq/L (ref 135–145)

## 2022-08-30 ENCOUNTER — Ambulatory Visit: Payer: BC Managed Care – PPO | Admitting: Endocrinology

## 2022-08-30 ENCOUNTER — Telehealth: Payer: Self-pay | Admitting: Internal Medicine

## 2022-08-30 ENCOUNTER — Encounter: Payer: Self-pay | Admitting: Endocrinology

## 2022-08-30 VITALS — BP 142/78 | HR 76 | Ht 65.0 in | Wt 143.4 lb

## 2022-08-30 DIAGNOSIS — Z7985 Long-term (current) use of injectable non-insulin antidiabetic drugs: Secondary | ICD-10-CM

## 2022-08-30 DIAGNOSIS — I10 Essential (primary) hypertension: Secondary | ICD-10-CM | POA: Diagnosis not present

## 2022-08-30 DIAGNOSIS — E119 Type 2 diabetes mellitus without complications: Secondary | ICD-10-CM

## 2022-08-30 NOTE — Telephone Encounter (Signed)
Melissa Benson [phone]  [name] called to say she had been to her endocrinologist today and her blood pressure was elevated 140/90 and 142/70 and they wanted her to call PCP. She also is getting fever blisters often.  I have ask her to keep a log of her blood pressure of 2-3 readings for the next week and call me on Tuesday to let me know what they are, so you can see if she needs to keep for another week or come in and be seen.

## 2022-08-30 NOTE — Progress Notes (Signed)
Patient ID: Melissa Benson, female   DOB: April 09, 1960, 62 y.o.   MRN: [id number]           Reason for Appointment: Follow-up for Type 2 Diabetes   History of Present Illness:          Date of diagnosis of type 2 diabetes mellitus: 2009         Previous history: She had been on various oral hypoglycemic drugs in the past and was on relatively large doses of basal insulin. 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 she was able to stop insulin A1c had gone down to 6% in 2018 but was followed only about once a year She was tried on Metformin alone in 2018 for about a year but this was then stopped  Recent history:    Non-insulin hypoglycemic drugs the patient is taking are: Mounjaro 7.5 mg weekly   A1c is under 6% consistently and now 5.4  Current management, blood sugar patterns and problems identified: She is not taking 7.5 mg Mounjaro, previously had difficulty with the 5 mg refills at the pharmacy She does not feel any different with this higher dose and no nausea Also weight has stayed about the same Home blood sugars are excellent with some readings done in the morning and a few in the evening with highest reading 154 She has started to do a little walking and lunchtime at work  Weight is leveled off         Side effects from medications have been: Nausea, some diarrhea from Metformin, some nausea from glipizide      Typical meal intake: Breakfast is usually yogurt, eggs.  Will have chicken and vegetables at dinner, snacks will be cheese crackers.  Usually not eating out             Glucose monitoring:  done 1+ times a day         Glucometer: Contour next.       Blood Glucose readings    PRE-MEAL Fasting Lunch Dinner Bedtime Overall  Glucose range: 99-1 10    96-1 54  Mean/median: 105    110   POST-MEAL PC  Breakfast PC Lunch PC Dinner  Glucose range:     Mean/median:      Previous:  PRE-MEAL Fasting Lunch Dinner Bedtime Overall  Glucose range: 93, 105    64-167  Mean/median:     116   POST-MEAL PC Breakfast PC Lunch PC Dinner  Glucose range:  64-167   Mean/median:  116    Dietician visit, most recent: 2017,  Weight history:  Wt Readings from Last 3 Encounters:  08/30/22 143 lb 6.4 oz (65 kg)  05/11/22 143 lb (64.9 kg)  03/22/22 141 lb 12.8 oz (64.3 kg)    Glycemic control:   Lab Results  Component Value Date   HGBA1C 5.4 08/25/2022   HGBA1C 5.5 05/10/2022   HGBA1C 5.6 03/21/2022   Lab Results  Component Value Date   MICROALBUR 0.4 03/22/2022   LDLCALC 37 05/10/2022   CREATININE 0.73 08/25/2022   Lab Results  Component Value Date   MICRALBCREAT 6 03/22/2022    Lab Results  Component Value Date   FRUCTOSAMINE 222 09/10/2020   FRUCTOSAMINE 263 03/19/2019    Lab on 08/25/2022  Component Date Value Ref Range Status   Sodium 08/25/2022  141  135 - 145 mEq/L Final   Potassium 08/25/2022 3.6  3.5 - 5.1 mEq/L Final   Chloride 08/25/2022 103  96 - 112 mEq/L Final   CO2 08/25/2022 32  19 - 32 mEq/L Final   Glucose, Bld 08/25/2022 110 (H)  70 - 99 mg/dL Final   BUN 96/29/5284 9  6 - 23 mg/dL Final   Creatinine, Ser 08/25/2022 0.73  0.40 - 1.20 mg/dL Final   GFR 13/24/4010 87.98  >60.00 mL/min Final   Calculated using the CKD-EPI Creatinine Equation (2021)   Calcium 08/25/2022 9.1  8.4 - 10.5 mg/dL Final   Hgb U7O MFr Bld 08/25/2022 5.4  4.6 - 6.5 % Final   Glycemic Control Guidelines for People with Diabetes:Non Diabetic:  <6%Goal of Therapy: <7%Additional Action Suggested:  >8%     Allergies as of 08/30/2022       Reactions   Morphine And Codeine Nausea And Vomiting   Pt states also caused arm to turn black where medication was administered    Morphine Other (See Comments)   Prednisone Hives   Other Nausea And Vomiting   TYLOX        Medication List         Accurate as of August 30, 2022  8:22 AM. If you have any questions, ask your nurse or doctor.          ALPRAZolam 0.5 MG tablet Commonly known as: XANAX TAKE 1 TABLET(0.5 MG) BY MOUTH TWICE DAILY AS NEEDED FOR ANXIETY   b complex vitamins capsule Take 1 capsule by mouth daily.   Contour Next Test test strip Generic drug: glucose blood USE AS DIRECTED TO TEST BLOOD GLUCOSE ONCE DAILY   cyclobenzaprine 10 MG tablet Commonly known as: FLEXERIL TAKE 1 TABLET(10 MG) BY MOUTH AT BEDTIME   estradiol 0.1 MG/GM vaginal cream Commonly known as: ESTRACE Place vaginally.   fluticasone 50 MCG/ACT nasal spray Commonly known as: FLONASE Place 2 sprays into both nostrils daily.   gabapentin 300 MG capsule Commonly known as: NEURONTIN TAKE 2 CAPSULES BY MOUTH THREE TIMES DAILY   HYDROcodone bit-homatropine 5-1.5 MG/5ML syrup Commonly known as: HYCODAN Take 5 mLs by mouth every 8 (eight) hours as needed for cough.   linaclotide 72 MCG capsule Commonly known as: LINZESS Take 72 mcg by mouth daily.   multivitamin Chew chewable tablet Chew 1 tablet by mouth daily.   promethazine 25 MG tablet Commonly known as: PHENERGAN TAKE 1 TABLET BY MOUTH EVERY 8 HOURS AS NEEDED FOR NAUSEA   ramipril 5 MG capsule Commonly known as: ALTACE One tab daily   rosuvastatin 5 MG tablet Commonly known as: CRESTOR TAKE 1 TABLET(5 MG) BY MOUTH DAILY   terconazole 0.4 % vaginal cream Commonly known as: TERAZOL 7 USE EXTERNALLY TWICE DAILY AS DIRECTED   tirzepatide 7.5 MG/0.5ML Pen Commonly known as: MOUNJARO Inject 7.5 mg into the skin once a week.   valACYclovir 500 MG tablet Commonly known as: VALTREX TAKE 1 TABLET(500 MG) BY MOUTH TWICE DAILY AS NEEDED FOR COLD SORES OR OUTBREAK        Allergies:  Allergies  Allergen Reactions   Morphine And Codeine Nausea And Vomiting    Pt states also caused arm to turn black where medication was administered    Morphine Other (See  Comments)   Prednisone Hives   Other Nausea And Vomiting    TYLOX    Past Medical History:  Diagnosis Date   Anxiety    Complication of  anesthesia    Diabetes mellitus    GERD (gastroesophageal reflux disease)    Headache    no issues since stop caffeine   History of hiatal hernia    History of migraine headaches    History of urinary tract infection    last one 11/2014    History of vertigo    Hyperlipidemia    Hypertension    Neck pain, chronic    OSA (obstructive sleep apnea)    on CPAP   PONV (postoperative nausea and vomiting)    Psoriasis    Rosacea    Weak urinary stream     Past Surgical History:  Procedure Laterality Date   2D ECHOCARDIOGRAM  07/20/2011   EF >55%, normal   ABDOMINAL HYSTERECTOMY     APPENDECTOMY     CARDIOVASCULAR STRESS TEST  07/20/2011   Normal myocardial perfusion study, EKG negative for ischemia, no ECG changes   CHOLECYSTECTOMY     colonscopy      polyps removed    DIAGNOSTIC LAPAROSCOPY     DILATION AND CURETTAGE OF UTERUS     LAPAROSCOPIC ROUX-EN-Y GASTRIC BYPASS WITH HIATAL HERNIA REPAIR N/A 01/12/2015   Procedure: LAPAROSCOPIC ROUX-EN-Y GASTRIC BYPASS WITH LYSIS OF ADHESIONS ;  Surgeon: Luretha Murphy, MD;  Location: [facility];  Service: General;  Laterality: N/A;   LAPAROSCOPY  02/01/2017   Laparoscopic lysis of adhesions   LAPAROSCOPY N/A 02/01/2017   Procedure: LAPAROSCOPY Lysis of adhesions;  Surgeon: Berna Bue, MD;  Location: [facility];  Service: General;  Laterality: N/A;   LAPAROTOMY N/A 02/01/2017   Procedure: excision of umbilical mass, small bowel resection;  Surgeon: Berna Bue, MD;  Location: [facility];  Service: General;  Laterality: N/A;   SPINE SURGERY     C6-C7 fusion x2   SPINE SURGERY     herniated disc L4-L5   TRIGGER FINGER RELEASE Left 03/14/2019   Procedure: RELEASE TRIGGER FINGER/A-1 PULLEY LEFT LONG FINGER;  Surgeon: Betha Loa, MD;  Location: [facility];  Service: Orthopedics;   Laterality: Left;  Bier block   TUBAL LIGATION      Family History  Problem Relation Age of Onset   Diabetes Mother    Heart disease Mother    Hypertension Mother    Lung disease Father    Heart disease Father    Hypertension Father    Diabetes Father    Hypertension Sister    Hyperlipidemia Brother    Hypertension Brother    Heart attack Maternal Grandmother    Heart disease Maternal Grandmother    Hypertension Maternal Grandfather    Diabetes Maternal Grandfather    Heart disease Maternal Grandfather     Social History:  reports that she quit smoking about 39 years ago. Her smoking use included cigarettes. She started smoking about 47 years ago. She has a 20 pack-year smoking history. She has never used smokeless tobacco. She reports that she does not drink alcohol and does not use drugs.   Review of Systems  Lipid history: Has been treated for cardiovascular risk reduction with Crestor 5 mg daily Followed by PCP   Lab Results  Component Value Date   CHOL 109 05/10/2022   HDL 56.50 05/10/2022   LDLCALC 37 05/10/2022   TRIG 73.0 05/10/2022   CHOLHDL 2 05/10/2022           Hypertension: Has been on ramipril 5 mg daily prescribed by PCP Blood pressure relatively higher today check twice but she  may be more stressed, not monitoring at home  BP Readings from Last 3 Encounters:  08/30/22 (!) 140/90  05/11/22 122/70  03/22/22 120/68    Most recent eye exam was in 6/24  Neuropathy: She has mild tingling or pains in her toes as before On gabapentin primarily for the back and her leg pain   Currently known complications of diabetes: None known  OSTEOPENIA: T score is -1.3 at the hip and previously in 2021 was - 1.4 Takes Vitamin D    Physical Examination:  BP (!) 140/90   Pulse 76   Ht 5\' 5"  (1.651 m)   Wt 143 lb 6.4 oz (65 kg)   SpO2 99%   BMI 23.86 kg/m     ASSESSMENT:  Diabetes type 2 non-insulin-dependent  See history of present illness for  detailed discussion of current diabetes management, blood sugar patterns and problems identified  A1c is 5.4 and about the same, somewhat lower than expected  Blood sugars are nearly normal and just over 100 fasting with only occasional readings checked after meals which are fairly good She is trying to maintain good lifestyle No symptoms of nausea with Mounjaro 7.5 mg   PLAN:    She can take the 7.5 mg Mounjaro long-term and her only treatment Increased frequency or duration of walking or other exercise regularly She will check her blood sugars more regularly after meals  Follow-up in 6 months  Lipids and hypertension to be followed by PCP Recommended checking blood pressure periodically at home    There are no Patient Instructions on file for this visit.     Reather Littler 08/30/2022, 8:22 AM   Note: This office note was prepared with Dragon voice recognition system technology. Any transcriptional errors that result from this process are unintentional.

## 2022-09-08 NOTE — Telephone Encounter (Signed)
Patient aware, 2 week follow up scheduled.

## 2022-09-08 NOTE — Telephone Encounter (Addendum)
[name] sent her Blood pressure readings 08/30/22 PM 119/61  8/28 AM 141/58   --  150/69 Lunch  143/69 PM  131/66  8/29 AM  139/76   142/76 Lunch  134/73 PM  148/73   133/62  8/30 AM  142/77   145/77 PM 138/71  8/31 AM  147/75 PM  130/68  9/1 AM  135/66 PM  127/65  9/2 AM  147/79 PM  143/73  9/3 AM  142/74   140/70  9/4 AM  146/78 PM  135/68  9/5 AM  151/87

## 2022-09-22 ENCOUNTER — Ambulatory Visit: Payer: BC Managed Care – PPO | Admitting: Internal Medicine

## 2022-09-22 ENCOUNTER — Encounter: Payer: Self-pay | Admitting: Internal Medicine

## 2022-09-22 VITALS — BP 150/90 | HR 75 | Ht 65.75 in | Wt 141.0 lb

## 2022-09-22 DIAGNOSIS — E1169 Type 2 diabetes mellitus with other specified complication: Secondary | ICD-10-CM

## 2022-09-22 DIAGNOSIS — I10 Essential (primary) hypertension: Secondary | ICD-10-CM | POA: Diagnosis not present

## 2022-09-22 DIAGNOSIS — Z8659 Personal history of other mental and behavioral disorders: Secondary | ICD-10-CM | POA: Diagnosis not present

## 2022-09-22 DIAGNOSIS — R609 Edema, unspecified: Secondary | ICD-10-CM

## 2022-09-22 DIAGNOSIS — Z23 Encounter for immunization: Secondary | ICD-10-CM | POA: Diagnosis not present

## 2022-09-22 DIAGNOSIS — Z9884 Bariatric surgery status: Secondary | ICD-10-CM

## 2022-09-22 DIAGNOSIS — Z6822 Body mass index (BMI) 22.0-22.9, adult: Secondary | ICD-10-CM

## 2022-09-22 DIAGNOSIS — E785 Hyperlipidemia, unspecified: Secondary | ICD-10-CM

## 2022-09-22 MED ORDER — RAMIPRIL 10 MG PO CAPS: 10.0000 mg | ORAL_CAPSULE | Freq: Every day | ORAL | 0 refills | Status: DC

## 2022-09-22 MED ORDER — PROMETHAZINE HCL 25 MG PO TABS: ORAL_TABLET | ORAL | 0 refills | Status: DC

## 2022-09-22 MED ORDER — FUROSEMIDE 20 MG PO TABS: 20.0000 mg | ORAL_TABLET | Freq: Every day | ORAL | 3 refills | Status: DC

## 2022-09-23 ENCOUNTER — Ambulatory Visit: Payer: BC Managed Care – PPO | Admitting: Internal Medicine

## 2022-09-26 ENCOUNTER — Other Ambulatory Visit: Payer: Self-pay | Admitting: Internal Medicine

## 2022-09-30 NOTE — Patient Instructions (Addendum)
Adding Lasix 20 mg daily as needed for dependent edema. Watch salt in take and try to walk some to help dependent edema. Promethazine refilled. Flu vaccine given today. Continue ramipril. Follow up in 2-3 weeks with OV and B-met.

## 2022-10-04 ENCOUNTER — Other Ambulatory Visit: Payer: BC Managed Care – PPO

## 2022-10-04 DIAGNOSIS — Z79899 Other long term (current) drug therapy: Secondary | ICD-10-CM | POA: Diagnosis not present

## 2022-10-04 DIAGNOSIS — R609 Edema, unspecified: Secondary | ICD-10-CM | POA: Diagnosis not present

## 2022-10-04 DIAGNOSIS — Z5181 Encounter for therapeutic drug level monitoring: Secondary | ICD-10-CM | POA: Diagnosis not present

## 2022-10-05 LAB — BASIC METABOLIC PANEL
BUN: 12 mg/dL (ref 7–25)
CO2: 32 mmol/L (ref 20–32)
Calcium: 9.5 mg/dL (ref 8.6–10.4)
Chloride: 102 mmol/L (ref 98–110)
Creat: 0.69 mg/dL (ref 0.50–1.05)
Glucose, Bld: 93 mg/dL (ref 65–99)
Potassium: 3.5 mmol/L (ref 3.5–5.3)
Sodium: 140 mmol/L (ref 135–146)

## 2022-10-06 ENCOUNTER — Ambulatory Visit: Payer: BC Managed Care – PPO | Admitting: Internal Medicine

## 2022-10-06 ENCOUNTER — Encounter: Payer: Self-pay | Admitting: Internal Medicine

## 2022-10-06 VITALS — BP 128/80 | HR 80 | Ht 65.75 in | Wt 138.0 lb

## 2022-10-06 DIAGNOSIS — R609 Edema, unspecified: Secondary | ICD-10-CM | POA: Diagnosis not present

## 2022-10-06 DIAGNOSIS — I10 Essential (primary) hypertension: Secondary | ICD-10-CM | POA: Diagnosis not present

## 2022-10-06 DIAGNOSIS — Z8659 Personal history of other mental and behavioral disorders: Secondary | ICD-10-CM

## 2022-10-06 MED ORDER — POTASSIUM CHLORIDE CRYS ER 20 MEQ PO TBCR: 20.0000 meq | EXTENDED_RELEASE_TABLET | Freq: Every day | ORAL | 3 refills | Status: DC

## 2022-10-19 ENCOUNTER — Other Ambulatory Visit: Payer: Self-pay | Admitting: Internal Medicine

## 2022-10-21 NOTE — Patient Instructions (Addendum)
Continue furosemide for dependent edema. Labs are stable. RTC in apprx 6 months for health maintenance exam or sooner if necessary.

## 2022-10-29 ENCOUNTER — Emergency Department: Payer: BC Managed Care – PPO

## 2022-10-29 ENCOUNTER — Other Ambulatory Visit: Payer: Self-pay

## 2022-10-29 ENCOUNTER — Emergency Department
Admission: EM | Admit: 2022-10-29 | Discharge: 2022-10-29 | Disposition: A | Discharge status: Home / Self Care | Payer: BC Managed Care – PPO | Attending: Emergency Medicine | Admitting: Emergency Medicine

## 2022-10-29 DIAGNOSIS — R42 Dizziness and giddiness: Secondary | ICD-10-CM

## 2022-10-29 DIAGNOSIS — Z90710 Acquired absence of both cervix and uterus: Secondary | ICD-10-CM | POA: Diagnosis not present

## 2022-10-29 DIAGNOSIS — I951 Orthostatic hypotension: Secondary | ICD-10-CM | POA: Diagnosis not present

## 2022-10-29 DIAGNOSIS — R1011 Right upper quadrant pain: Secondary | ICD-10-CM | POA: Diagnosis not present

## 2022-10-29 DIAGNOSIS — I6523 Occlusion and stenosis of bilateral carotid arteries: Secondary | ICD-10-CM | POA: Diagnosis not present

## 2022-10-29 DIAGNOSIS — R7401 Elevation of levels of liver transaminase levels: Secondary | ICD-10-CM | POA: Diagnosis not present

## 2022-10-29 DIAGNOSIS — I700 Atherosclerosis of aorta: Secondary | ICD-10-CM | POA: Diagnosis not present

## 2022-10-29 DIAGNOSIS — R110 Nausea: Secondary | ICD-10-CM | POA: Diagnosis not present

## 2022-10-29 DIAGNOSIS — I10 Essential (primary) hypertension: Secondary | ICD-10-CM | POA: Diagnosis not present

## 2022-10-29 LAB — COMPREHENSIVE METABOLIC PANEL
ALT: 164 U/L — ABNORMAL HIGH (ref 0–44)
AST: 224 U/L — ABNORMAL HIGH (ref 15–41)
Albumin: 4.2 g/dL (ref 3.5–5.0)
Alkaline Phosphatase: 81 U/L (ref 38–126)
Anion gap: 10 (ref 5–15)
BUN: 19 mg/dL (ref 8–23)
CO2: 26 mmol/L (ref 22–32)
Calcium: 9.7 mg/dL (ref 8.9–10.3)
Chloride: 100 mmol/L (ref 98–111)
Creatinine, Ser: 0.87 mg/dL (ref 0.44–1.00)
GFR, Estimated: >60 mL/min (ref >60)
Glucose, Bld: 152 mg/dL — ABNORMAL HIGH (ref 70–99)
Potassium: 3.9 mmol/L (ref 3.5–5.1)
Sodium: 136 mmol/L (ref 135–145)
Total Bilirubin: 0.7 mg/dL (ref 0.3–1.2)
Total Protein: 6.9 g/dL (ref 6.5–8.1)

## 2022-10-29 LAB — CBC
HCT: 40.5 % (ref 36.0–46.0)
Hemoglobin: 13.2 g/dL (ref 12.0–15.0)
MCH: 30 pg (ref 26.0–34.0)
MCHC: 32.6 g/dL (ref 30.0–36.0)
MCV: 92 fL (ref 80.0–100.0)
Platelets: 211 10*3/uL (ref 150–400)
RBC: 4.4 MIL/uL (ref 3.87–5.11)
RDW: 12.2 % (ref 11.5–15.5)
WBC: 6.5 10*3/uL (ref 4.0–10.5)
nRBC: 0 % (ref 0.0–0.2)

## 2022-10-29 LAB — URINALYSIS, ROUTINE W REFLEX MICROSCOPIC
Bilirubin Urine: NEGATIVE
Glucose, UA: NEGATIVE mg/dL
Hgb urine dipstick: NEGATIVE
Ketones, ur: NEGATIVE mg/dL
Leukocytes,Ua: NEGATIVE
Nitrite: NEGATIVE
Protein, ur: NEGATIVE mg/dL
Specific Gravity, Urine: 1.017 (ref 1.005–1.030)
pH: 5 (ref 5.0–8.0)

## 2022-10-29 LAB — TROPONIN I (HIGH SENSITIVITY): Troponin I (High Sensitivity): 3 ng/L (ref <18)

## 2022-10-29 LAB — LIPASE, BLOOD: Lipase: 32 U/L (ref 11–51)

## 2022-10-29 LAB — HEPATITIS PANEL, ACUTE
HCV Ab: NONREACTIVE
Hep A IgM: NONREACTIVE
Hep B C IgM: NONREACTIVE
Hepatitis B Surface Ag: NONREACTIVE

## 2022-10-29 LAB — D-DIMER, QUANTITATIVE: D-Dimer, Quant: 0.3 ug{FEU}/mL (ref 0.00–0.50)

## 2022-10-29 LAB — CK: Total CK: 33 U/L — ABNORMAL LOW (ref 38–234)

## 2022-10-29 LAB — MAGNESIUM: Magnesium: 2 mg/dL (ref 1.7–2.4)

## 2022-10-29 MED ORDER — MECLIZINE HCL 25 MG PO TABS
25.0000 mg | ORAL_TABLET | Freq: Once | ORAL | Status: AC
  Administered 2022-10-29: 25 mg via ORAL
  Filled 2022-10-29: qty 1

## 2022-10-29 MED ORDER — SODIUM CHLORIDE 0.9 % IV BOLUS
1000.0000 mL | Freq: Once | INTRAVENOUS | Status: AC
  Administered 2022-10-29: 1000 mL via INTRAVENOUS

## 2022-10-29 MED ORDER — IOHEXOL 350 MG/ML SOLN
75.0000 mL | Freq: Once | INTRAVENOUS | Status: AC | PRN
  Administered 2022-10-29: 75 mL via INTRAVENOUS

## 2022-10-29 MED ORDER — MECLIZINE HCL 25 MG PO TABS: 25.0000 mg | ORAL_TABLET | Freq: Three times a day (TID) | ORAL | 0 refills | Status: AC | PRN

## 2022-10-29 NOTE — Discharge Instructions (Addendum)
Workup was reassuring incidental findings noted on CTs are below that you discussed with your primary care doctor.  I suspect that this is related to the Lasix and some dehydration given when you stood up your blood pressure did drop.  You are feeling better with the meclizine and fluids.  You can take the meclizine as needed and hold your Lasix for the next few days if symptoms are getting better then continue holding the Lasix.  If you develop worsening symptoms, worsening dizziness please return to the ER for repeat evaluation.  At this time we discussed MRI but given your resolution of symptoms we have opted to hold off.  However if things are changing you Noyes return to the ER for repeat evaluation  No specific abnormality is identified to explain the patient's  right upper quadrant abdominal pain.  2. Gastric bypass.  3. Lower lumbar spondylosis and degenerative disc disease with left  foraminal stenosis and suspected central narrowing of the thecal sac  at the L4-5 level.  4. Mild mitral valve calcification.   1. Negative for large vessel occlusion. 2. No significant extracranial or vertebrobasilar atherosclerosis. ICA siphon calcified plaque with mild left siphon stenosis. 3.  Aortic Atherosclerosis (ICD10-I70.0). 4. 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

## 2022-10-29 NOTE — ED Notes (Signed)
Pt returned from CT °

## 2022-10-29 NOTE — ED Provider Notes (Addendum)
[facility] Provider Note    Event Date/Time   First MD Initiated Contact with Patient 10/29/22 0940     (approximate)   History   No chief complaint on file.   HPI  Melissa Benson is a 62 y.o. female with diet, hypertension who comes in with concerns for dizziness.  Patient reports dizziness started on Thursday.  She has had it constantly since then.  She has it at rest but worse with movement.  Does report a history of vertigo but nothing like this.  She does report a little discomfort in her ears more on the left than the right.  She denies any chest pain.  She does report a little bit of pain with taking a deep breath on her right side.  She denies any other abdominal pain.  She states that she was recently started on Lasix 20 mg due to some mild edema and had BP medication increased recently.  She states that she noticed her symptoms the worst in the morning an hour or 2 after taking her medications.  I reviewed the record from 10/06/2022   Physical Exam   Triage Vital Signs: ED Triage Vitals  Encounter Vitals Group     BP      Systolic BP Percentile      Diastolic BP Percentile      Pulse      Resp      Temp      Temp src      SpO2      Weight      Height      Head Circumference      Peak Flow      Pain Score      Pain Loc      Pain Education      Exclude from Growth Chart     Most recent vital signs: Vitals:   10/29/22 0946  BP: 127/71  Pulse: 100  Resp: 20  SpO2: 100%     General: Awake, no distress.  CV:  Good peripheral perfusion.  Resp:  Normal effort.  Abd:  No distention.  Soft and nontender Other:  No swelling in legs.  No calf tenderness TMs are clear bilaterally.  Extraocular movements are intact.  Stroke scale is 0.  Finger-nose intact  ED Results / Procedures / Treatments   Labs (all labs ordered are listed, but only abnormal results are displayed) Labs Reviewed  CBC  URINALYSIS, ROUTINE W REFLEX MICROSCOPIC   COMPREHENSIVE METABOLIC PANEL  LIPASE, BLOOD  D-DIMER, QUANTITATIVE  CBG MONITORING, ED  TROPONIN I (HIGH SENSITIVITY)     EKG  My interpretation of EKG: Sinus rhythm 93 without any ST elevations, T wave inversions in lead III, normal intervals   RADIOLOGY I have reviewed the   PROCEDURES:  Critical Care performed: No  .1-3 Lead EKG Interpretation  Performed by: Concha Se, MD Authorized by: Concha Se, MD     Interpretation: normal     ECG rate:  90   ECG rate assessment: normal     Rhythm: sinus rhythm     Ectopy: PVCs     Conduction: normal      MEDICATIONS ORDERED IN ED: Medications  meclizine (ANTIVERT) tablet 25 mg (has no administration in time range)     IMPRESSION / MDM / ASSESSMENT AND PLAN / ED COURSE  I reviewed the triage vital signs and the nursing notes.   Patient's presentation is most consistent  with acute presentation with potential threat to life or bodily function.   Patient comes in with some dizziness.  Out of the window for stroke code given onset was on Thursday.  Given the pain with breathing will get on D-dimer.  Cardiac markers to evaluate for ACS, orthostatics to evaluate for dehydration, labs to evaluate for electrolyte abnormalities from her recently starting Lasix.   10:38 AM patient has positive orthostatics will give a liter of fluid.  Her LFTs are slightly elevated she is already had her gallbladder removed she does report some right upper quadrant tenderness we will get a CT scan to further evaluate and for the dizziness will get CT angio to evaluate for any type of stenosis that could be higher risk for causing a stroke.  D-dimer is negative low suspicion for PE.  Magnesium normal CBC normal CMP otherwise reassuring.  Lipase normal  With her elevated LFTs she denies any significant Tylenol use, alcohol use will add on CK level  Reevaluation and patient is feeling much better after the fluids.  We discussed MRI to rule  out stroke but at this time she reports resolution of dizziness while she is laying still and significant improvement with standing therefore seems more orthostatic in nature related to the increase in her blood pressure medicine with adding the Lasix.  She is no swelling in her legs at this time and we discussed holding the Lasix for now.  At this time she would like to hold off on MRI and be discharged home and trial these modifications and if things are changing or worsening she will return to the ER for repeat evaluation  Repeat neurological exam is reassuring with normal finger-nose, normal heel-to-shin, ambulatory without any ataxia, negative Romberg.  Follow up recheck of LFTS with her PCP in 1-2 weeks.  The patient is on the cardiac monitor to evaluate for evidence of arrhythmia and/or significant heart rate changes.      FINAL CLINICAL IMPRESSION(S) / ED DIAGNOSES   Final diagnoses:  Dizziness  Orthostatic hypotension     Rx / DC Orders   ED Discharge Orders          Ordered    meclizine (ANTIVERT) 25 MG tablet  3 times daily PRN        10/29/22 1309             Note:  This document was prepared using Dragon voice recognition software and may include unintentional dictation errors.   Concha Se, MD 10/29/22 1312    Concha Se, MD 10/29/22 [phone]

## 2022-10-29 NOTE — ED Triage Notes (Signed)
Pt via POV from home. Pt c/o dizziness and nausea since Thursday states movement will cause the dizziness to occur, reports a hx of vertigo but never this severe per pt. Pt reports RUQ pain. Denies any head injury. Pt is A&Ox4 and NAD

## 2022-10-29 NOTE — ED Notes (Signed)
Pt stating slight dizziness upon standing

## 2022-10-29 NOTE — ED Notes (Signed)
Pt taken to CT.

## 2022-11-04 ENCOUNTER — Encounter: Payer: Self-pay | Admitting: Internal Medicine

## 2022-11-04 ENCOUNTER — Ambulatory Visit: Payer: BC Managed Care – PPO | Admitting: Internal Medicine

## 2022-11-04 ENCOUNTER — Ambulatory Visit
Admission: RE | Admit: 2022-11-04 | Discharge: 2022-11-04 | Disposition: A | Discharge status: Home / Self Care | Payer: BC Managed Care – PPO | Source: Ambulatory Visit | Attending: Internal Medicine | Admitting: Internal Medicine

## 2022-11-04 VITALS — BP 110/80 | HR 76 | Ht 65.75 in | Wt 135.0 lb

## 2022-11-04 DIAGNOSIS — M47814 Spondylosis without myelopathy or radiculopathy, thoracic region: Secondary | ICD-10-CM | POA: Diagnosis not present

## 2022-11-04 DIAGNOSIS — I10 Essential (primary) hypertension: Secondary | ICD-10-CM

## 2022-11-04 DIAGNOSIS — Z9884 Bariatric surgery status: Secondary | ICD-10-CM

## 2022-11-04 DIAGNOSIS — Z981 Arthrodesis status: Secondary | ICD-10-CM | POA: Diagnosis not present

## 2022-11-04 DIAGNOSIS — R7989 Other specified abnormal findings of blood chemistry: Secondary | ICD-10-CM | POA: Diagnosis not present

## 2022-11-04 DIAGNOSIS — Z8659 Personal history of other mental and behavioral disorders: Secondary | ICD-10-CM

## 2022-11-04 DIAGNOSIS — Z87440 Personal history of urinary (tract) infections: Secondary | ICD-10-CM

## 2022-11-04 DIAGNOSIS — E1169 Type 2 diabetes mellitus with other specified complication: Secondary | ICD-10-CM | POA: Diagnosis not present

## 2022-11-04 DIAGNOSIS — E785 Hyperlipidemia, unspecified: Secondary | ICD-10-CM

## 2022-11-04 DIAGNOSIS — K5909 Other constipation: Secondary | ICD-10-CM

## 2022-11-04 DIAGNOSIS — F439 Reaction to severe stress, unspecified: Secondary | ICD-10-CM

## 2022-11-04 DIAGNOSIS — M4804 Spinal stenosis, thoracic region: Secondary | ICD-10-CM | POA: Diagnosis not present

## 2022-11-04 MED ORDER — RAMIPRIL 5 MG PO CAPS: 5.0000 mg | ORAL_CAPSULE | Freq: Every day | ORAL | 3 refills | Status: DC

## 2022-11-04 NOTE — Progress Notes (Addendum)
Patient Care Team: Margaree Mackintosh, MD as PCP - General  Visit Date: 11/04/22  Subjective:    Patient ID: Melissa Benson , Female   DOB: 17-May-1960, 62 y.o.    MRN: [id number]   62 y.o. Female presents today for a Emergency Department follow-up. Seen in ED  at [facility] on 10/29/22 for dizziness since the morning of 10/28/22. She woke up in the morning and felt dizzy upon rising from her bed. She was also having lower back pain that radiated to her abdomen. CT abdomen and pelvis with contrast showed: 1) No specific abnormality is identified to explain the patient's right upper quadrant abdominal pain, 2) Gastric bypass, 3) Lower lumbar spondylosis and degenerative disc disease with left foraminal stenosis and suspected central narrowing of the thecal sac at the L4-5 level, 4) Mild mitral valve calcification. CT head and neck showed: 1) Negative for large vessel occlusion, 2) No significant extracranial or vertebrobasilar atherosclerosis. 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 EKG showed sinus rhythm 93 without any ST elevations, T wave inversions in lead III, normal intervals, PVCs. AST elevated at 224, ALT elevated at 164. Hepatitis panel negative. Urine was cloudy. Given meclizine and fluids. History of vertigo but this was more severe. Stopped Lasix and discharged with meclizine 25 mg three times daily as needed. She had an episode of dizziness on 11/03/22. Bending forward worsens dizziness. Dizziness is improved today. She is having intermittent back pain with numbness/tingling in her left leg. Taking Tylenol without relief. She has had some stress recently regarding selling her mother's house. Denies  alcohol consumption, taking other meds. Denies urinary symptoms. Is on Mounjaro per Endocrinolgy, low dose  Crestor, Ramipril, Linzess, Lasix,Flexeril, Xanax, Gabapentin.  History of decreased hearing but this has improved.  Reports blood pressures have been running low recently.   Notes recurrent fever blister on her lower lip. She is taking valacyclovir as needed without relief.  Liver functions remain elevated today acute Heaptitis panel ordered.Total CK is 33. AST is 234 and was 224 in ED on September 26. ALT is 224 and was 164 on September 26.  Past Medical History:  Diagnosis Date   Anxiety    Complication of anesthesia    Diabetes mellitus    GERD (gastroesophageal reflux disease)    Headache    no issues since stop caffeine   History of hiatal hernia    History of migraine headaches    History of urinary tract infection    last one 11/2014    History of vertigo    Hyperlipidemia    Hypertension    Neck pain, chronic    OSA (obstructive sleep apnea)    on CPAP   PONV (postoperative nausea and vomiting)    Psoriasis    Rosacea    Weak urinary stream      Family History  Problem Relation Age of Onset   Diabetes Mother    Heart disease Mother    Hypertension Mother    Lung disease Father    Heart disease Father    Hypertension Father    Diabetes Father    Hypertension Sister    Hyperlipidemia Brother    Hypertension Brother    Heart attack Maternal Grandmother    Heart disease Maternal Grandmother    Hypertension Maternal Grandfather    Diabetes Maternal Grandfather  Heart disease Maternal Grandfather     Social History   Social History Narrative   Lives with husband and one of her children in a one story home.  Has 2 children.     Works as a Corporate treasurer at [employer].  Education: college.   Considerable Family stress at present which was discussed at length. Nonsmoker. Denies alcohol consumption.   Review of Systems  Constitutional:  Negative for fever and malaise/fatigue.  HENT:  Negative for congestion.   Eyes:  Negative for blurred vision.   Respiratory:  Negative for cough and shortness of breath.   Cardiovascular:  Negative for chest pain, palpitations and leg swelling.  Gastrointestinal:  Negative for vomiting.  Genitourinary:  Negative for dysuria, flank pain, frequency, hematuria and urgency.  Musculoskeletal:  Positive for back pain (Lower).  Skin:  Positive for rash (Fever blister lower lip).  Neurological:  Positive for dizziness. Negative for loss of consciousness and headaches.        Objective:   Vitals: BP 110/80   Pulse 76   Ht 5' 5.75" (1.67 m)   Wt 135 lb (61.2 kg)   SpO2 99%   BMI 21.96 kg/m    Physical Exam Vitals and nursing note reviewed.  Constitutional:      General: She is not in acute distress.    Appearance: Normal appearance. She is not toxic-appearing.  HENT:     Head: Normocephalic and atraumatic.     Right Ear: Hearing, tympanic membrane, ear canal and external ear normal.     Left Ear: Hearing, tympanic membrane, ear canal and external ear normal.  Eyes:     Extraocular Movements: Extraocular movements intact.     Pupils: Pupils are equal, round, and reactive to light.     Comments: No nystagmus  Cardiovascular:     Rate and Rhythm: Normal rate and regular rhythm. No extrasystoles are present.    Pulses: Normal pulses.     Heart sounds: Normal heart sounds. No murmur heard.    No friction rub. No gallop.     Comments: Occasional irregular contraction. Pulmonary:     Effort: Pulmonary effort is normal. No respiratory distress.     Breath sounds: Normal breath sounds. No wheezing or rales.  Musculoskeletal:     Comments: 5/5 muscle strength in lower extremities.  Skin:    General: Skin is warm and dry.  Neurological:     General: No focal deficit present.     Mental Status: She is alert and oriented to person, place, and time. Mental status is at baseline.     Cranial Nerves: Cranial nerves 2-12 are intact.     Sensory: Sensation is intact.     Motor: Motor function is  intact.     Comments: Straight leg raise test negative bilaterally.  Psychiatric:        Mood and Affect: Mood normal.        Behavior: Behavior normal.        Thought Content: Thought content normal.        Judgment: Judgment normal.     No Jaudice or scleral icterus.  Results:   Studies obtained and personally reviewed by me:  10/29/22 CT abdomen and pelvis with contrast showed: 1) No specific abnormality is identified to explain the patient's right upper quadrant abdominal pain, 2) Gastric bypass, 3) Lower lumbar spondylosis and degenerative disc disease with left foraminal stenosis and suspected central narrowing of the thecal sac at the L4-5 level. 4)  Mild mitral valve calcification.   10/29/22 CT head and neck showed: 1) Negative for large vessel occlusion, 2) No significant extracranial or vertebrobasilar atherosclerosis. 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  Labs:       Component Value Date/Time   NA 136 10/29/2022 0956   K 3.9 10/29/2022 0956   CL 100 10/29/2022 0956   CO2 26 10/29/2022 0956   GLUCOSE 152 (H) 10/29/2022 0956   BUN 19 10/29/2022 0956   CREATININE 0.87 10/29/2022 0956   CREATININE 0.69 10/04/2022 1106   CALCIUM 9.7 10/29/2022 0956   PROT 6.9 10/29/2022 0956   ALBUMIN 4.2 10/29/2022 0956   AST 224 (H) 10/29/2022 0956   ALT 164 (H) 10/29/2022 0956   ALKPHOS 81 10/29/2022 0956   BILITOT 0.7 10/29/2022 0956   GFRNONAA >60 10/29/2022 0956   GFRNONAA 81 12/03/2019 0905   GFRAA 94 12/03/2019 0905     Lab Results  Component Value Date   WBC 6.5 10/29/2022   HGB 13.2 10/29/2022   HCT 40.5 10/29/2022   MCV 92.0 10/29/2022   PLT 211 10/29/2022    Lab Results  Component Value Date   CHOL 109 05/10/2022   HDL 56.50 05/10/2022   LDLCALC 37 05/10/2022   TRIG 73.0  05/10/2022   CHOLHDL 2 05/10/2022    Lab Results  Component Value Date   HGBA1C 5.4 08/25/2022     Lab Results  Component Value Date   TSH 2.16 03/21/2022      Assessment & Plan:   Elevated liver functions: ordered repeat liver panel. Hep B surface antibody is negative. Hep A antibody is negative. Hep C antibody is negative. Ferritin is 149. Protime is normal.Liver functions were normal May 2024. Doubt Mounjaro as cause for elevated LFTs as well as  low dose Crestor. May need liver MRI.  Hx of gastric bypass surgery  Hypertension: start ramipril 5 mg daily.  Hyperlipidemia- continue low dose Crestor  Back pain: ordered thoracic spine X-ray. Will contact with results.Need to call Radiology has not been read as of Nov.11  Situational stress and anxiety- prescribed Xanax 0.5 mg twice daily as needed.  Hx of constipation treated with Linzess  Sleep apnea  Type 2 Diabetes treated at [facility] currently on [facility]  Hx of flu-like illness tested positive for Influenza A  December 2023  BMI 21.96  Remote hx of hysterectomy  Plan: Urgent referral to GI for evaluation of significantly elevated LFTs with negative CT of liver/pancreas.Patient requesting [facility].  I,Alexander Ruley,acting as a Neurosurgeon for Margaree Mackintosh, MD.,have documented all relevant documentation on the behalf of Margaree Mackintosh, MD,as directed by  Margaree Mackintosh, MD while in the presence of Margaree Mackintosh, MD.   I, Margaree Mackintosh, MD, have reviewed all documentation for this visit. The documentation on 11/07/22 for the exam, diagnosis, procedures, and orders are all accurate and complete.IMargaree Mackintosh, MD, have reviewed all documentation for this visit. The documentation on 11/14/22 for the exam, diagnosis, procedures, and orders are all accurate and complete.

## 2022-11-05 LAB — HEPATIC FUNCTION PANEL
AG Ratio: 1.9 (calc) (ref 1.0–2.5)
ALT: 216 U/L — ABNORMAL HIGH (ref 6–29)
AST: 234 U/L — ABNORMAL HIGH (ref 10–35)
Albumin: 4.2 g/dL (ref 3.6–5.1)
Alkaline phosphatase (APISO): 93 U/L (ref 37–153)
Bilirubin, Direct: 0.1 mg/dL (ref 0.0–0.2)
Globulin: 2.2 g/dL (ref 1.9–3.7)
Indirect Bilirubin: 0.3 mg/dL (ref 0.2–1.2)
Total Bilirubin: 0.4 mg/dL (ref 0.2–1.2)
Total Protein: 6.4 g/dL (ref 6.1–8.1)

## 2022-11-06 ENCOUNTER — Other Ambulatory Visit: Payer: Self-pay | Admitting: Internal Medicine

## 2022-11-07 ENCOUNTER — Encounter: Payer: Self-pay | Admitting: Internal Medicine

## 2022-11-07 ENCOUNTER — Ambulatory Visit: Payer: BC Managed Care – PPO | Admitting: Internal Medicine

## 2022-11-07 VITALS — BP 160/100 | HR 83 | Ht 65.75 in | Wt 133.0 lb

## 2022-11-07 DIAGNOSIS — R7989 Other specified abnormal findings of blood chemistry: Secondary | ICD-10-CM

## 2022-11-07 NOTE — Progress Notes (Signed)
Patient Care Team: Margaree Mackintosh, MD as PCP - General  Visit Date: 11/07/22  Subjective:    Patient ID: Melissa Benson , Female   DOB: 06/29/1960, 62 y.o.    MRN: [id number]   62 y.o. Female presents today for follow-up on elevated liver functions. AST elevated at 234, ALT elevated at 216 on 11/04/22, up from 224 and 164 on 10/29/22.  She has had back pain for years but says it is getting worse. Hx of cervical spine issues seen by Dr. Venetia Maxon in the remote past. Has neck and thoracic back pain.   Past Medical History:  Diagnosis Date   Anxiety    Complication of anesthesia    Diabetes mellitus    GERD (gastroesophageal reflux disease)    Headache    no issues since stop caffeine   History of hiatal hernia    History of migraine headaches    History of urinary tract infection    last one 11/2014    History of vertigo    Hyperlipidemia    Hypertension    Neck pain, chronic    OSA (obstructive sleep apnea)    on CPAP   PONV (postoperative nausea and vomiting)    Psoriasis    Rosacea    Weak urinary stream      Family History  Problem Relation Age of Onset   Diabetes Mother    Heart disease Mother    Hypertension Mother    Lung disease Father    Heart disease Father    Hypertension Father    Diabetes Father    Hypertension Sister    Hyperlipidemia Brother    Hypertension Brother    Heart attack Maternal Grandmother    Heart disease Maternal Grandmother    Hypertension Maternal Grandfather    Diabetes Maternal Grandfather    Heart disease Maternal Grandfather     Social History   Social History Narrative   Lives with husband and one of her children in a one story home.  Has 2 children.     Works as a Corporate treasurer at [employer].  Education: college.      Review of Systems  Constitutional:  Negative for fever and malaise/fatigue.  HENT:  Negative for congestion.   Eyes:  Negative for blurred vision.  Respiratory:  Negative for cough and shortness of  breath.   Cardiovascular:  Negative for chest pain, palpitations and leg swelling.  Gastrointestinal:  Negative for vomiting.  Musculoskeletal:  Positive for back pain and neck pain.  Skin:  Negative for rash.  Neurological:  Negative for loss of consciousness and headaches.        Objective:   Vitals: BP (!) 160/100   Pulse 83   Ht 5' 5.75" (1.67 m)   Wt 133 lb (60.3 kg)   SpO2 98%   BMI 21.63 kg/m    Physical Exam Vitals and nursing note reviewed.  Constitutional:      General: She is not in acute distress.    Appearance: Normal appearance. She is not toxic-appearing.  HENT:     Head: Normocephalic and atraumatic.  Pulmonary:     Effort: Pulmonary effort is normal.  Skin:    General: Skin is warm and dry.     Coloration: Skin is not jaundiced.  Neurological:     Mental Status: She is alert and oriented to person, place, and time. Mental status is at baseline.  Psychiatric:        Mood  and Affect: Mood normal.        Behavior: Behavior normal.        Thought Content: Thought content normal.        Judgment: Judgment normal.       Results:   Studies obtained and personally reviewed by me:   Labs:       Component Value Date/Time   NA 136 10/29/2022 0956   K 3.9 10/29/2022 0956   CL 100 10/29/2022 0956   CO2 26 10/29/2022 0956   GLUCOSE 152 (H) 10/29/2022 0956   BUN 19 10/29/2022 0956   CREATININE 0.87 10/29/2022 0956   CREATININE 0.69 10/04/2022 1106   CALCIUM 9.7 10/29/2022 0956   PROT 6.4 11/04/2022 1303   ALBUMIN 4.2 10/29/2022 0956   AST 234 (H) 11/04/2022 1303   ALT 216 (H) 11/04/2022 1303   ALKPHOS 81 10/29/2022 0956   BILITOT 0.4 11/04/2022 1303   GFRNONAA >60 10/29/2022 0956   GFRNONAA 81 12/03/2019 0905   GFRAA 94 12/03/2019 0905     Lab Results  Component Value Date   WBC 6.5 10/29/2022   HGB 13.2 10/29/2022   HCT 40.5 10/29/2022   MCV 92.0 10/29/2022   PLT 211 10/29/2022    Lab Results  Component Value Date   CHOL 109  05/10/2022   HDL 56.50 05/10/2022   LDLCALC 37 05/10/2022   TRIG 73.0 05/10/2022   CHOLHDL 2 05/10/2022    Lab Results  Component Value Date   HGBA1C 5.4 08/25/2022     Lab Results  Component Value Date   TSH 2.16 03/21/2022      Assessment & Plan:   Elevated liver functions: ordered BNP, ferritin, protime-INR, hepatitis panel. Will contact with results.    I,Alexander Ruley,acting as a Neurosurgeon for Margaree Mackintosh, MD.,have documented all relevant documentation on the behalf of Margaree Mackintosh, MD,as directed by  Margaree Mackintosh, MD while in the presence of Margaree Mackintosh, MD.   I, Margaree Mackintosh, MD, have reviewed all documentation for this visit. The documentation on 11/14/22 for the exam, diagnosis, procedures, and orders are all accurate and complete.

## 2022-11-07 NOTE — Progress Notes (Signed)
   Subjective:    Patient ID: Melissa Benson, female    DOB: [birth year], 62 y.o.   MRN: [id number]  HPI Here today at my request to follow up on elevated liver functions. Has no jaundice. I would like to do further testing. Reviewed ED notes from [facility]. Reviewed current meds. Plan to draw Hepatitis B titers, Hep A titer, Protime and  Ferritin today.  Considerable situational stress with husband's family discussed. Explained this is not causing elevated liver functions. Denies taking excessive amts. of analgesics and does not consume alcohol. Works at a [employer].  Complaining of thoracic back pain. Have ordered Xray of T-spine.  Review of Systems has had back pain for years but says it is getting worse.Hx of cervical spine issues seen by Dr. Venetia Maxon in the remote past.  Has neck and thoracic back pain.     Objective:   Physical Exam BP 160/100, patient is anxious and worried, pulse ox 98% pulse 83, weight 133 lbs. BMI 21.63   Skin: Warm and dry.  No icterus chest is clear to auscultation without rales.  Cardiac exam: Regular rate and rhythm.  No lower extremity edema.      Assessment & Plan:   Elevated liver functions-etiology unclear if she is on Mounjaro.  Elevation of liver functions was asymptomatic.  She presented to [facility] emergency department with dizziness on October 29, 2022 and elevated liver functions were noted at that time.  She has never had a history of elevated liver functions in the past and has been followed here for a number of years.  Have drawn today hepatitis titers, pro time and ferritin.  Would like for her to see gastroenterologist.  She prefers [facility] gastroenterology.  Thoracic back pain-etiology unclear.  Have ordered x-ray of T-spine

## 2022-11-08 LAB — PROTIME-INR
INR: 1
Prothrombin Time: 10.9 s (ref 9.0–11.5)

## 2022-11-08 LAB — HEPATITIS C ANTIBODY: Hepatitis C Ab: NONREACTIVE

## 2022-11-08 LAB — HEPATITIS B SURFACE ANTIBODY,QUALITATIVE: Hep B S Ab: NONREACTIVE

## 2022-11-08 LAB — FERRITIN: Ferritin: 149 ng/mL (ref 16–288)

## 2022-11-08 LAB — BRAIN NATRIURETIC PEPTIDE: Brain Natriuretic Peptide: 43 pg/mL (ref <100)

## 2022-11-08 LAB — HEPATITIS A ANTIBODY, TOTAL: Hepatitis A AB,Total: NONREACTIVE

## 2022-11-08 LAB — HEPATITIS B SURFACE ANTIGEN: Hepatitis B Surface Ag: NONREACTIVE

## 2022-11-10 ENCOUNTER — Telehealth: Payer: Self-pay | Admitting: Gastroenterology

## 2022-11-10 NOTE — Telephone Encounter (Signed)
I have called patient and let her know that [facility] will be calling her with an appointment with Dr Tomasa Rand as soon as he opens his schedule. She verbalized understanding.

## 2022-11-10 NOTE — Telephone Encounter (Signed)
ALT and AST and mid 200s which appears new, drawn on November 1  We can see this patient for this issue but I am not sure when our next opening is, if we have anything in the next 2 weeks I think that is okay.  Please let the primary care physician know they should repeating the LFTs this week to make sure stable and if it is rising then we will need a more urgent evaluation.  Also, Dr. Elnoria Howard is covering Dr. Kenna Gilbert patients at their practice as well if he needs a more acute issue addressed.

## 2022-11-10 NOTE — Telephone Encounter (Signed)
Copied from CRM [id number]. Topic: Referral - Question >> Nov 10, 2022  9:31 AM Melissa Benson wrote: Reason for CRM: Pt called to get clarification on if she should take a February appt or see Dr. Loreta Ave  cb# [phone]

## 2022-11-10 NOTE — Telephone Encounter (Signed)
Hi Dr. Adela Lank,    Supervising Provider 11/10/2022 AM    We received a urgent referral for patient due to abnormal liver function. Patient's primary care office called on patient's behalf and stated patient is wishing to transfer due to Dr. Loreta Ave currently being on medical leave. Patient last saw Dr. Loreta Ave for a colonoscopy in August on 2024 and seen in the office in March of 2024. Patient's primary care is requesting for a urgent appointment. Advised our next available is currently in February. Patient's previous records are in [facility] media and procedures for you to review and advise on scheduling.    Thank you

## 2022-11-14 NOTE — Patient Instructions (Signed)
Have spent additional time today discussing elevated liver functions and pursuing further testing.  She has situational stress with husband's family.  This was discussed as well.  Plan on urgent referral to Gastroenterology and have ordered thoracic spine films as she is complaining of thoracic spine pain.  Do not think Mounjaro is causing elevated liver functions.

## 2022-11-14 NOTE — Patient Instructions (Signed)
Referral to [facility] (urgent appt), Continue current meds. This does not seem to be acute viral Hepatitis. Meds you are on usually do not cause elevated liver functions. Take Ramipril for elevated blood pressure. Take Xaax twice daily as needed for anxiety and situational stress. Ordered Xray of thoracic spine.

## 2022-11-17 ENCOUNTER — Encounter: Payer: Self-pay | Admitting: Internal Medicine

## 2022-11-17 ENCOUNTER — Ambulatory Visit: Payer: BC Managed Care – PPO | Admitting: Internal Medicine

## 2022-11-17 VITALS — BP 130/80 | HR 88 | Ht 65.75 in | Wt 133.0 lb

## 2022-11-17 DIAGNOSIS — M546 Pain in thoracic spine: Secondary | ICD-10-CM

## 2022-11-17 DIAGNOSIS — M47814 Spondylosis without myelopathy or radiculopathy, thoracic region: Secondary | ICD-10-CM | POA: Diagnosis not present

## 2022-11-17 DIAGNOSIS — R7989 Other specified abnormal findings of blood chemistry: Secondary | ICD-10-CM

## 2022-11-17 NOTE — Progress Notes (Signed)
Patient Care Team: Margaree Mackintosh, MD as PCP - General  Visit Date: 11/17/22  Subjective:    Patient ID: Melissa Benson , Female   DOB: Mar 13, 1960, 62 y.o.    MRN: [id number]   62 y.o. Female presents today for liver functions recheck. Hepatitis panel negative. INR normal. Ferritin normal. BNP normal. She has upcoming appt with [facility] about elevated liver functions.  She is still having neck and back pain. Driving and certain other positions make pain worse. 11/04/22 X-ray thoracic spine showed: Interval progression of multilevel thoracic spondylosis, facet arthropathy at T10-11 results in probable neural foraminal narrowing, 2) Prior C6-7 ACDF and posterior spinal fusion with fractured C7 vertebral body screws noted on recent  T spine film. Patient having significant pain with this recently.    In 2020, she had CT of T-spine by Dr. Conchita Paris for follow up of a fall the previous year showing C7-T1 solid posterior-lateral arthrodesis. No acute fracture or discitis or aggressive bone lesion. No fracture noted.  Prior dictation in 2019 notes that she had had mid back pain around the level of her shoulder blades treated with Flexeril.  EMG in 2014 showed mild L5 radiculopathy.  In October 2017 she had an EMG of right upper and lower extremities.  Findings were consistent with a very mild old intraspinal canal lesion consistent with radiculopathy affecting the right L5 nerve segment.  I was able to find imaging of the C-spine in 2012 ordered by Dr. Venetia Maxon under radiology section in epic where patient had lower neck and upper back pain radiating into both shoulders and arms right greater than left with tingling and numbness.  Symptoms have been present for 4 to 5 months.  She had 2 prior cervical spine surgeries according to that history.  She was noted to have fusion at C6-C7 anteriorly and posteriorly.  There was some light foraminal narrowing due to uncinate spurs at C3-C4 on the right.   Upper thoracic spine was normal at that time.  There appeared to be a solid anterior cervical fusion C6-C7 with posterior fusion as well.  No evidence of myelopathy cord impingement or foraminal stenosis C6-C7 or C7-T2.  All records indicates she had surgery for ruptured cervical disc 2004 in 2005.  I do not have a copy of the neurosurgical records.  Past Medical History:  Diagnosis Date   Anxiety    Complication of anesthesia    Diabetes mellitus    GERD (gastroesophageal reflux disease)    Headache    no issues since stop caffeine   History of hiatal hernia    History of migraine headaches    History of urinary tract infection    last one 11/2014    History of vertigo    Hyperlipidemia    Hypertension    Neck pain, chronic    OSA (obstructive sleep apnea)    on CPAP   PONV (postoperative nausea and vomiting)    Psoriasis    Rosacea    Weak urinary stream      Family History  Problem Relation Age of Onset   Diabetes Mother    Heart disease Mother    Hypertension Mother    Lung disease Father    Heart disease Father    Hypertension Father    Diabetes Father    Hypertension Sister    Hyperlipidemia Brother    Hypertension Brother    Heart attack Maternal Grandmother    Heart disease Maternal Grandmother  Hypertension Maternal Grandfather    Diabetes Maternal Grandfather    Heart disease Maternal Grandfather     Social History   Social History Narrative   Lives with husband and one of her children in a one story home.  Has 2 children.     Works as a Corporate treasurer at [employer].  Education: college.      Review of Systems  Constitutional:  Negative for fever and malaise/fatigue.  HENT:  Negative for congestion.   Eyes:  Negative for blurred vision.  Respiratory:  Negative for cough and shortness of breath.   Cardiovascular:  Negative for chest pain, palpitations and leg swelling.  Gastrointestinal:  Negative for vomiting.  Musculoskeletal:  Positive for back  pain and neck pain.  Skin:  Negative for rash.  Neurological:  Negative for loss of consciousness and headaches.        Objective:   Vitals: BP 130/80   Pulse 88   Ht 5' 5.75" (1.67 m)   Wt 133 lb (60.3 kg)   SpO2 98%   BMI 21.63 kg/m    Physical Exam Vitals and nursing note reviewed.  Constitutional:      General: She is not in acute distress.    Appearance: Normal appearance. She is not toxic-appearing.  HENT:     Head: Normocephalic and atraumatic.  Pulmonary:     Effort: Pulmonary effort is normal.  Musculoskeletal:     Comments: Point tenderness right of thoracic spine.  Skin:    General: Skin is warm and dry.  Neurological:     Mental Status: She is alert and oriented to person, place, and time. Mental status is at baseline.  Psychiatric:        Mood and Affect: Mood normal.        Behavior: Behavior normal.        Thought Content: Thought content normal.        Judgment: Judgment normal.       Results:   Studies obtained and personally reviewed by me:  11/04/22 X-ray thoracic spine showed: Interval progression of multilevel thoracic spondylosis, facet arthropathy at T10-11 results in probable neural foraminal narrowing, 2) Prior C6-7 ACDF and posterior spinal fusion with fractured C7 vertebral body screws, unchanged.  Labs:       Component Value Date/Time   NA 136 10/29/2022 0956   K 3.9 10/29/2022 0956   CL 100 10/29/2022 0956   CO2 26 10/29/2022 0956   GLUCOSE 152 (H) 10/29/2022 0956   BUN 19 10/29/2022 0956   CREATININE 0.87 10/29/2022 0956   CREATININE 0.69 10/04/2022 1106   CALCIUM 9.7 10/29/2022 0956   PROT 6.4 11/04/2022 1303   ALBUMIN 4.2 10/29/2022 0956   AST 234 (H) 11/04/2022 1303   ALT 216 (H) 11/04/2022 1303   ALKPHOS 81 10/29/2022 0956   BILITOT 0.4 11/04/2022 1303   GFRNONAA >60 10/29/2022 0956   GFRNONAA 81 12/03/2019 0905   GFRAA 94 12/03/2019 0905     Lab Results  Component Value Date   WBC 6.5 10/29/2022   HGB 13.2  10/29/2022   HCT 40.5 10/29/2022   MCV 92.0 10/29/2022   PLT 211 10/29/2022    Lab Results  Component Value Date   CHOL 109 05/10/2022   HDL 56.50 05/10/2022   LDLCALC 37 05/10/2022   TRIG 73.0 05/10/2022   CHOLHDL 2 05/10/2022    Lab Results  Component Value Date   HGBA1C 5.4 08/25/2022     Lab Results  Component  Value Date   TSH 2.16 03/21/2022      Assessment & Plan:   Elevated liver functions: These were detected on a recent emergency department visit at [facility].  Scheduled to see Hyacinth Meeker PA at [facility] on 11/22/22. Ordered repeat hepatic function panel.   Neck and back pain: continue with pain management regimen. Referral to Neurosurgery.  Patient says this pain is aggravating and quite severe.  She has a history of C-spine surgery in the remote past.  I would like to refer her back to [facility].    I,Alexander Ruley,acting as a Neurosurgeon for Margaree Mackintosh, MD.,have documented all relevant documentation on the behalf of Margaree Mackintosh, MD,as directed by  Margaree Mackintosh, MD while in the presence of Margaree Mackintosh, MD.   I, Margaree Mackintosh, MD, have reviewed all documentation for this visit. The documentation on 11/17/22 for the exam, diagnosis, procedures, and orders are all accurate and complete.

## 2022-11-17 NOTE — Addendum Note (Signed)
Addended by: Gregery Na on: 11/17/2022 03:44 PM   Modules accepted: Orders

## 2022-11-17 NOTE — Patient Instructions (Addendum)
Patient continues to have upper thoracic and lower cervical neck pain which she describes as fairly severe.  History of prior C6-C7 ACDF and posterior spinal fusion with C7 vertebral body screws.  Recent x-ray shows fractured C7 vertebral body screws. Refer to [facility] where patient has been seen previously although not recently.  Has upcoming GI evaluation for elevated liver functions.

## 2022-11-18 LAB — HEPATIC FUNCTION PANEL
AG Ratio: 2.2 (calc) (ref 1.0–2.5)
ALT: 62 U/L — ABNORMAL HIGH (ref 6–29)
AST: 39 U/L — ABNORMAL HIGH (ref 10–35)
Albumin: 4.3 g/dL (ref 3.6–5.1)
Alkaline phosphatase (APISO): 81 U/L (ref 37–153)
Bilirubin, Direct: 0.1 mg/dL (ref 0.0–0.2)
Globulin: 2 g/dL (ref 1.9–3.7)
Indirect Bilirubin: 0.3 mg/dL (ref 0.2–1.2)
Total Bilirubin: 0.4 mg/dL (ref 0.2–1.2)
Total Protein: 6.3 g/dL (ref 6.1–8.1)

## 2022-11-19 ENCOUNTER — Other Ambulatory Visit: Payer: Self-pay | Admitting: Internal Medicine

## 2022-11-22 ENCOUNTER — Ambulatory Visit: Payer: BC Managed Care – PPO | Admitting: Physician Assistant

## 2022-11-22 ENCOUNTER — Encounter: Payer: Self-pay | Admitting: Physician Assistant

## 2022-11-22 VITALS — BP 128/70 | HR 70 | Ht 65.0 in | Wt 132.6 lb

## 2022-11-22 DIAGNOSIS — R1011 Right upper quadrant pain: Secondary | ICD-10-CM

## 2022-11-22 DIAGNOSIS — R7989 Other specified abnormal findings of blood chemistry: Secondary | ICD-10-CM | POA: Diagnosis not present

## 2022-11-22 NOTE — Progress Notes (Signed)
Chief Complaint: Elevated LFTs  HPI:    Melissa Benson is a 62 year old Caucasian female with a past medical history as listed below including reflux, OSA, psoriasis and rosacea, who was referred to me by Margaree Mackintosh, MD for a complaint of elevated LFTs.    05/10/2022 normal LFTs.  10/29/2022 AST 224, ALT 164.  11/04/2022 AST 234, ALT 216.  11/07/22 hepatitis B and a NCL negative/normal.  INR normal.  Ferritin normal.  11/17/2022 AST 39, ALT 62.    08/08/2022 colonoscopy by Dr. Loreta Ave with a few small mouth diverticula in the entire colon and small internal hemorrhoids.  Otherwise normal.  Repeat recommended 10 years.    10/29/2022 seen at [facility] emergency department with dizziness.  Elevated LFTs at the time.  No prior history.  CT abdomen pelvis with contrast at the time showed gastric bypass, lower lumbar spondylosis and degenerative disc disease with left foraminal stenosis and suspected central narrowing of the thecal sac at the L4-5 level as well as mild mitral valve calcification.    Today, the patient describes that back in October she was seen in the [facility] ER for dizziness but also had some right upper quadrant pain that seem to wrap around to her back at the same time.  She was also found to have elevated LFTs.  Never been told this previously.  Denies any alcohol use.  Explains that they told her she was dehydrated but then her primary care doctor told her this was not the case.  She followed up with her PCP as above with LFTs which had continued to increase and her Gabapentin was stopped about a week ago.  Recheck of LFTs most recently shows they have greatly decreased and are almost back to normal.  She does continue with some right upper quadrant discomfort which seems to be worse if she is up and walking around throughout the day and often times she has to hold this area, for example if she goes shopping.  Denies any change with eating or any other exacerbating factors.  Nothing seems to  help this other than sitting down and resting.  She has always attributed it to her back.  Further episodes of dizziness.    Surgical history positive for previous cholecystectomy.    Denies fever, chills, weight loss, nausea, vomiting, heartburn, reflux, family history of liver disease or supplement usage.  Past Medical History:  Diagnosis Date   Anxiety    Complication of anesthesia    Diabetes mellitus    GERD (gastroesophageal reflux disease)    Headache    no issues since stop caffeine   History of hiatal hernia    History of migraine headaches    History of urinary tract infection    last one 11/2014    History of vertigo    Hyperlipidemia    Hypertension    Neck pain, chronic    OSA (obstructive sleep apnea)    on CPAP   PONV (postoperative nausea and vomiting)    Psoriasis    Rosacea    Weak urinary stream     Past Surgical History:  Procedure Laterality Date   2D ECHOCARDIOGRAM  07/20/2011   EF >55%, normal   ABDOMINAL HYSTERECTOMY     APPENDECTOMY     CARDIOVASCULAR STRESS TEST  07/20/2011   Normal myocardial perfusion study, EKG negative for ischemia, no ECG changes   CHOLECYSTECTOMY     colonscopy      polyps removed  DIAGNOSTIC LAPAROSCOPY     DILATION AND CURETTAGE OF UTERUS     LAPAROSCOPIC ROUX-EN-Y GASTRIC BYPASS WITH HIATAL HERNIA REPAIR N/A 01/12/2015   Procedure: LAPAROSCOPIC ROUX-EN-Y GASTRIC BYPASS WITH LYSIS OF ADHESIONS ;  Surgeon: Luretha Murphy, MD;  Location: [facility];  Service: General;  Laterality: N/A;   LAPAROSCOPY  02/01/2017   Laparoscopic lysis of adhesions   LAPAROSCOPY N/A 02/01/2017   Procedure: LAPAROSCOPY Lysis of adhesions;  Surgeon: Berna Bue, MD;  Location: [facility] OR;  Service: General;  Laterality: N/A;   LAPAROTOMY N/A 02/01/2017   Procedure: excision of umbilical mass, small bowel resection;  Surgeon: Berna Bue, MD;  Location: [facility] OR;  Service: General;  Laterality: N/A;   SPINE SURGERY     C6-C7 fusion x2   SPINE  SURGERY     herniated disc L4-L5   TRIGGER FINGER RELEASE Left 03/14/2019   Procedure: RELEASE TRIGGER FINGER/A-1 PULLEY LEFT LONG FINGER;  Surgeon: Betha Loa, MD;  Location: [facility];  Service: Orthopedics;  Laterality: Left;  Bier block   TUBAL LIGATION      Current Outpatient Medications  Medication Sig Dispense Refill   ALPRAZolam (XANAX) 0.5 MG tablet TAKE 1 TABLET(0.5 MG) BY MOUTH TWICE DAILY AS NEEDED FOR ANXIETY 60 tablet 5   b complex vitamins capsule Take 1 capsule by mouth daily.     cyclobenzaprine (FLEXERIL) 10 MG tablet TAKE 1 TABLET(10 MG) BY MOUTH AT BEDTIME 90 tablet 2   estradiol (ESTRACE) 0.1 MG/GM vaginal cream Place vaginally.     fluticasone (FLONASE) 50 MCG/ACT nasal spray Place 2 sprays into both nostrils daily. 16 g 0   glucose blood (CONTOUR NEXT TEST) test strip USE AS DIRECTED TO TEST BLOOD GLUCOSE ONCE DAILY 100 strip 3   linaclotide (LINZESS) 72 MCG capsule Take 72 mcg by mouth daily.     multivitamin (VIT W/EXTRA C) CHEW chewable tablet Chew 1 tablet by mouth daily.     promethazine (PHENERGAN) 25 MG tablet TAKE 1 TABLET BY MOUTH EVERY 8 HOURS AS NEEDED FOR NAUSEA 90 tablet 0   ramipril (ALTACE) 5 MG capsule Take 1 capsule (5 mg total) by mouth daily. 30 capsule 3   rosuvastatin (CRESTOR) 5 MG tablet TAKE 1 TABLET(5 MG) BY MOUTH DAILY 90 tablet 3   tirzepatide (MOUNJARO) 7.5 MG/0.5ML Pen Inject 7.5 mg into the skin once a week. 6 mL 1   valACYclovir (VALTREX) 500 MG tablet TAKE 1 TABLET(500 MG) BY MOUTH TWICE DAILY AS NEEDED FOR COLD SORES OR OUTBREAK 10 tablet 3   No current facility-administered medications for this visit.    Allergies as of 11/22/2022 - Review Complete 11/17/2022  Allergen Reaction Noted   Morphine and codeine Nausea And Vomiting 01/08/2015   Morphine Other (See Comments) 02/26/2019   Prednisone Hives 10/24/2019   Other Nausea And Vomiting 03/14/2019    Family History  Problem Relation Age of Onset   Diabetes  Mother    Heart disease Mother    Hypertension Mother    Lung disease Father    Heart disease Father    Hypertension Father    Diabetes Father    Hypertension Sister    Hyperlipidemia Brother    Hypertension Brother    Heart attack Maternal Grandmother    Heart disease Maternal Grandmother    Hypertension Maternal Grandfather    Diabetes Maternal Grandfather    Heart disease Maternal Grandfather     Social History   Socioeconomic History   Marital  status: Married    Spouse name: Not on file   Number of children: 2   Years of education: college   Highest education level: Associate degree: academic program  Occupational History   Occupation: Corporate treasurer  Tobacco Use   Smoking status: Former    Current packs/day: 0.00    Average packs/day: 2.5 packs/day for 8.0 years (20.0 ttl pk-yrs)    Types: Cigarettes    Start date: 05/18/1975    Quit date: 05/18/1983    Years since quitting: 39.5   Smokeless tobacco: Never  Vaping Use   Vaping status: Never Used  Substance and Sexual Activity   Alcohol use: No    Alcohol/week: 0.0 standard drinks of alcohol   Drug use: No   Sexual activity: Never    Birth control/protection: Abstinence  Other Topics Concern   Not on file  Social History Narrative   Lives with husband and one of her children in a one story home.  Has 2 children.     Works as a Corporate treasurer at [employer].  Education: college.   Social Determinants of Health   Financial Resource Strain: Low Risk  (11/03/2022)   Overall Financial Resource Strain (CARDIA)    Difficulty of Paying Living Expenses: Not very hard  Food Insecurity: No Food Insecurity (11/03/2022)   Hunger Vital Sign    Worried About Running Out of Food in the Last Year: Never true    Ran Out of Food in the Last Year: Never true  Transportation Needs: No Transportation Needs (11/03/2022)   PRAPARE - Administrator, Civil Service (Medical): No    Lack of Transportation (Non-Medical): No   Physical Activity: Inactive (11/03/2022)   Exercise Vital Sign    Days of Exercise per Week: 0 days    Minutes of Exercise per Session: 30 min  Stress: Stress Concern Present (11/03/2022)   [employer] of Occupational Health - Occupational Stress Questionnaire    Feeling of Stress : Very much  Social Connections: Socially Integrated (11/03/2022)   Social Connection and Isolation Panel [NHANES]    Frequency of Communication with Friends and Family: More than three times a week    Frequency of Social Gatherings with Friends and Family: More than three times a week    Attends Religious Services: More than 4 times per year    Active Member of [employer] or Organizations: Yes    Attends Engineer, structural: More than 4 times per year    Marital Status: Married  Catering manager Violence: Not on file    Review of Systems:    Constitutional: No weight loss, fever or chills Skin: No rash  Cardiovascular: No chest pain   Respiratory: No SOB  Gastrointestinal: See HPI and otherwise negative Genitourinary: No dysuria  Neurological: +dizziness Musculoskeletal: No new muscle or joint pain Hematologic: No bleeding  Psychiatric: No history of depression or anxiety   Physical Exam:  Vital signs: BP 128/70   Pulse 70   Ht 5\' 5"  (1.651 m)   Wt 132 lb 9.6 oz (60.1 kg)   SpO2 97%   BMI 22.07 kg/m    Constitutional:   Pleasant elderly Caucasian female appears to be in NAD, Well developed, Well nourished, alert and cooperative Head:  Normocephalic and atraumatic. Eyes:   PEERL, EOMI. No icterus. Conjunctiva pink. Ears:  Normal auditory acuity. Neck:  Supple Throat: Oral cavity and pharynx without inflammation, swelling or lesion.  Respiratory: Respirations even and unlabored. Lungs clear to  auscultation bilaterally.   No wheezes, crackles, or rhonchi.  Cardiovascular: Normal S1, S2. No MRG. Regular rate and rhythm. No peripheral edema, cyanosis or pallor.  Gastrointestinal:   Soft, nondistended, mild RUQ ttp. No rebound or guarding. Normal bowel sounds. No appreciable masses or hepatomegaly. Rectal:  Not performed.  Msk:  Symmetrical without gross deformities. Without edema, no deformity or joint abnormality.  Neurologic:  Alert and  oriented x4;  grossly normal neurologically.  Skin:   Dry and intact without significant lesions or rashes. Psychiatric: Demonstrates good judgement and reason without abnormal affect or behaviors.  RELEVANT LABS AND IMAGING: CBC    Component Value Date/Time   WBC 6.5 10/29/2022 0956   RBC 4.40 10/29/2022 0956   HGB 13.2 10/29/2022 0956   HCT 40.5 10/29/2022 0956   PLT 211 10/29/2022 0956   MCV 92.0 10/29/2022 0956   MCH 30.0 10/29/2022 0956   MCHC 32.6 10/29/2022 0956   RDW 12.2 10/29/2022 0956   LYMPHSABS 1,446 03/21/2022 0907   MONOABS 240 11/10/2015 1131   EOSABS 61 03/21/2022 0907   BASOSABS 19 03/21/2022 0907    CMP     Component Value Date/Time   NA 136 10/29/2022 0956   K 3.9 10/29/2022 0956   CL 100 10/29/2022 0956   CO2 26 10/29/2022 0956   GLUCOSE 152 (H) 10/29/2022 0956   BUN 19 10/29/2022 0956   CREATININE 0.87 10/29/2022 0956   CREATININE 0.69 10/04/2022 1106   CALCIUM 9.7 10/29/2022 0956   PROT 6.3 11/17/2022 1154   ALBUMIN 4.2 10/29/2022 0956   AST 39 (H) 11/17/2022 1154   ALT 62 (H) 11/17/2022 1154   ALKPHOS 81 10/29/2022 0956   BILITOT 0.4 11/17/2022 1154   GFRNONAA >60 10/29/2022 0956   GFRNONAA 81 12/03/2019 0905   GFRAA 94 12/03/2019 0905    Assessment: 1.  Right upper quadrant pain: Continues per patient, she relates it to more of a back pain, worse when she is up and walking around for a long period of time; consider relation of back pain where scar tissue from prior cholecystectomy versus possible choledocholithiasis 2.  Elevated LFTs: LFTs have trended down, most recently 11/14 AST 39 ALT 62, hepatitis labs checked by PCP and negative, PCP stopped Gabapentin over the past week;  consider relation to possible past bile duct stone versus DILI versus other 3.  Screening for colorectal cancer: Recent colonoscopy in August, repeat due in 10 years  Plan: 1.  Ordered right upper quadrant ultrasound for further evaluation of right upper quadrant pain and elevated LFTs. 2.  Will recheck LFTs after Thanksgiving, 12/05/2022 3.  Patient to follow in clinic per recommendations after ultrasound and labs above.  Had previously requested transfer of care to Dr. Adela Lank.  Hyacinth Meeker, PA-C [facility] 11/22/2022, 11:35 AM  Cc: Margaree Mackintosh, MD

## 2022-11-22 NOTE — Progress Notes (Signed)
Agree with assessment plan as outlined. Looks like LFTs are downtrending.  Given abrupt rise and fall this could be DILI, will await follow up imaging and repeat labs. If this persists without clear cause will need additional serologic evaluation.  Of note AST predominant, creatine kinase already checked and negative.  She should also avoid alcohol

## 2022-11-22 NOTE — Patient Instructions (Signed)
_______________________________________________________  If your blood pressure at your visit was 140/90 or greater, please contact your primary care physician to follow up on this.  _______________________________________________________  If you are age 63 or older, your body mass index should be between 23-30. Your Body mass index is 22.07 kg/m. If this is out of the aforementioned range listed, please consider follow up with your Primary Care Provider.  If you are age 35 or younger, your body mass index should be between 19-25. Your Body mass index is 22.07 kg/m. If this is out of the aformentioned range listed, please consider follow up with your Primary Care Provider.   ________________________________________________________  The [facility] providers would like to encourage you to use [facility] to communicate with providers for non-urgent requests or questions.  Due to long hold times on the telephone, sending your provider a message by [facility] may be a faster and more efficient way to get a response.  Please allow 48 business hours for a response.  Please remember that this is for non-urgent requests.  _______________________________________________________  Your provider has requested that you go to the basement level for lab work on 12-05-22. Press "B" on the elevator. The lab is located at the first door on the left as you exit the elevator.  You have been scheduled for an abdominal ultrasound at [facility] (1st floor of hospital) on 11-29-22 at 9am . Please arrive 30 minutes prior to your appointment for registration. Make certain not to have anything to eat or drink after midnight prior to your appointment. Should you need to reschedule your appointment, please contact radiology at [phone]. This test typically takes about 30 minutes to perform.  Due to recent changes in healthcare laws, you may see the results of your imaging and laboratory studies on MyChart before your  provider has had a chance to review them.  We understand that in some cases there may be results that are confusing or concerning to you. Not all laboratory results come back in the same time frame and the provider may be waiting for multiple results in order to interpret others.  Please give [place] 48 hours in order for your provider to thoroughly review all the results before contacting the office for clarification of your results.   Thank you for entrusting me with your care and choosing [facility].  Hyacinth Meeker, PA-C

## 2022-11-28 ENCOUNTER — Other Ambulatory Visit: Payer: Self-pay | Admitting: Internal Medicine

## 2022-11-29 ENCOUNTER — Ambulatory Visit (HOSPITAL_COMMUNITY)
Admission: RE | Admit: 2022-11-29 | Discharge: 2022-11-29 | Disposition: A | Discharge status: Home / Self Care | Payer: BC Managed Care – PPO | Source: Ambulatory Visit | Attending: Physician Assistant | Admitting: Physician Assistant

## 2022-11-29 DIAGNOSIS — Z9049 Acquired absence of other specified parts of digestive tract: Secondary | ICD-10-CM | POA: Diagnosis not present

## 2022-11-29 DIAGNOSIS — R1011 Right upper quadrant pain: Secondary | ICD-10-CM | POA: Insufficient documentation

## 2022-11-29 DIAGNOSIS — R7989 Other specified abnormal findings of blood chemistry: Secondary | ICD-10-CM | POA: Diagnosis not present

## 2022-12-05 ENCOUNTER — Other Ambulatory Visit (INDEPENDENT_AMBULATORY_CARE_PROVIDER_SITE_OTHER): Payer: BC Managed Care – PPO

## 2022-12-05 DIAGNOSIS — M5414 Radiculopathy, thoracic region: Secondary | ICD-10-CM | POA: Diagnosis not present

## 2022-12-05 DIAGNOSIS — R7989 Other specified abnormal findings of blood chemistry: Secondary | ICD-10-CM

## 2022-12-05 DIAGNOSIS — R1011 Right upper quadrant pain: Secondary | ICD-10-CM | POA: Diagnosis not present

## 2022-12-05 LAB — HEPATIC FUNCTION PANEL
ALT: 39 U/L — ABNORMAL HIGH (ref 0–35)
AST: 36 U/L (ref 0–37)
Albumin: 4 g/dL (ref 3.5–5.2)
Alkaline Phosphatase: 83 U/L (ref 39–117)
Bilirubin, Direct: 0.2 mg/dL (ref 0.0–0.3)
Total Bilirubin: 0.5 mg/dL (ref 0.2–1.2)
Total Protein: 6.3 g/dL (ref 6.0–8.3)

## 2022-12-09 ENCOUNTER — Other Ambulatory Visit: Payer: Self-pay | Admitting: Neurosurgery

## 2022-12-09 DIAGNOSIS — M5414 Radiculopathy, thoracic region: Secondary | ICD-10-CM

## 2022-12-13 ENCOUNTER — Ambulatory Visit
Admission: RE | Admit: 2022-12-13 | Discharge: 2022-12-13 | Disposition: A | Discharge status: Home / Self Care | Payer: BC Managed Care – PPO | Source: Ambulatory Visit | Attending: Neurosurgery | Admitting: Neurosurgery

## 2022-12-13 DIAGNOSIS — M47814 Spondylosis without myelopathy or radiculopathy, thoracic region: Secondary | ICD-10-CM | POA: Diagnosis not present

## 2022-12-13 DIAGNOSIS — M5414 Radiculopathy, thoracic region: Secondary | ICD-10-CM | POA: Insufficient documentation

## 2022-12-13 DIAGNOSIS — M549 Dorsalgia, unspecified: Secondary | ICD-10-CM | POA: Diagnosis not present

## 2022-12-13 DIAGNOSIS — M4804 Spinal stenosis, thoracic region: Secondary | ICD-10-CM | POA: Diagnosis not present

## 2022-12-16 ENCOUNTER — Ambulatory Visit: Payer: BC Managed Care – PPO | Admitting: Internal Medicine

## 2022-12-16 VITALS — BP 130/80 | HR 88 | Temp 98.5°F | Ht 65.0 in | Wt 135.0 lb

## 2022-12-16 DIAGNOSIS — R350 Frequency of micturition: Secondary | ICD-10-CM | POA: Diagnosis not present

## 2022-12-16 DIAGNOSIS — R300 Dysuria: Secondary | ICD-10-CM

## 2022-12-16 DIAGNOSIS — N3000 Acute cystitis without hematuria: Secondary | ICD-10-CM

## 2022-12-16 LAB — POCT URINALYSIS DIP (CLINITEK)
Bilirubin, UA: NEGATIVE
Blood, UA: NEGATIVE
Glucose, UA: NEGATIVE mg/dL
Ketones, POC UA: NEGATIVE mg/dL
Nitrite, UA: NEGATIVE
POC PROTEIN,UA: NEGATIVE
Spec Grav, UA: 1.015 (ref 1.010–1.025)
Urobilinogen, UA: 0.2 U/dL
pH, UA: 6 (ref 5.0–8.0)

## 2022-12-16 MED ORDER — FLUCONAZOLE 150 MG PO TABS: 150.0000 mg | ORAL_TABLET | Freq: Once | ORAL | 0 refills | Status: AC

## 2022-12-16 MED ORDER — NITROFURANTOIN MONOHYD MACRO 100 MG PO CAPS: 100.0000 mg | ORAL_CAPSULE | Freq: Two times a day (BID) | ORAL | 0 refills | Status: DC

## 2022-12-16 NOTE — Progress Notes (Signed)
Patient Care Team: Margaree Mackintosh, MD as PCP - General  Visit Date: 12/16/22  Subjective:    Patient ID: Melissa Benson , Female   DOB: November 30, 1960, 62 y.o.    MRN: [id number]   62 y.o. Female presents today for dysuria, vaginal itching, vaginal odor for several days. Itching worsened yesterday. Urinalysis today showed trace leukocytes.   History of thoracic radiculopathy. She continues to have thoracic back pain. This is affecting her mood. She has to lay down midday at work to get through the afternoon. Has stopped taking gabapentin due to numerous negative side effects.  Past Medical History:  Diagnosis Date   Anxiety    Complication of anesthesia    Diabetes mellitus    GERD (gastroesophageal reflux disease)    Headache    no issues since stop caffeine   History of hiatal hernia    History of migraine headaches    History of urinary tract infection    last one 11/2014    History of vertigo    Hyperlipidemia    Hypertension    Neck pain, chronic    OSA (obstructive sleep apnea)    on CPAP   PONV (postoperative nausea and vomiting)    Psoriasis    Rosacea    Weak urinary stream      Family History  Problem Relation Age of Onset   Diabetes Mother    Heart disease Mother    Hypertension Mother    Lung disease Father    Heart disease Father    Hypertension Father    Diabetes Father    Hypertension Sister    Hyperlipidemia Brother    Hypertension Brother    Heart attack Maternal Grandmother    Heart disease Maternal Grandmother    Hypertension Maternal Grandfather    Diabetes Maternal Grandfather    Heart disease Maternal Grandfather     Social History   Social History Narrative   Lives with husband and one of her children in a one story home.  Has 2 children.     Works as a Corporate treasurer at [employer].  Education: college.      Review of Systems  Constitutional:  Negative for fever and malaise/fatigue.  HENT:  Negative for congestion.   Eyes:   Negative for blurred vision.  Respiratory:  Negative for cough and shortness of breath.   Cardiovascular:  Negative for chest pain, palpitations and leg swelling.  Gastrointestinal:  Negative for vomiting.  Genitourinary:  Positive for dysuria.       (+) Vaginal itching (+) Vaginal odor  Musculoskeletal:  Negative for back pain.  Skin:  Negative for rash.  Neurological:  Negative for loss of consciousness and headaches.        Objective:   Vitals: BP 130/80 (BP Location: Left Arm, Patient Position: Sitting)   Pulse 88   Temp 98.5 F (36.9 C) (Tympanic)   Ht 5\' 5"  (1.651 m)   Wt 135 lb (61.2 kg)   SpO2 99%   BMI 22.47 kg/m    Physical Exam Vitals and nursing note reviewed.  Constitutional:      General: She is not in acute distress.    Appearance: Normal appearance. She is not toxic-appearing.  HENT:     Head: Normocephalic and atraumatic.  Pulmonary:     Effort: Pulmonary effort is normal.  Skin:    General: Skin is warm and dry.  Neurological:     Mental Status: She is alert and  oriented to person, place, and time. Mental status is at baseline.  Psychiatric:        Mood and Affect: Mood normal.        Behavior: Behavior normal.        Thought Content: Thought content normal.        Judgment: Judgment normal.      Results:   Studies obtained and personally reviewed by me:  Urinalysis today showed trace leukocytes.  Labs:       Component Value Date/Time   NA 136 10/29/2022 0956   K 3.9 10/29/2022 0956   CL 100 10/29/2022 0956   CO2 26 10/29/2022 0956   GLUCOSE 152 (H) 10/29/2022 0956   BUN 19 10/29/2022 0956   CREATININE 0.87 10/29/2022 0956   CREATININE 0.69 10/04/2022 1106   CALCIUM 9.7 10/29/2022 0956   PROT 6.3 12/05/2022 0820   ALBUMIN 4.0 12/05/2022 0820   AST 36 12/05/2022 0820   ALT 39 (H) 12/05/2022 0820   ALKPHOS 83 12/05/2022 0820   BILITOT 0.5 12/05/2022 0820   GFRNONAA >60 10/29/2022 0956   GFRNONAA 81 12/03/2019 0905   GFRAA 94  12/03/2019 0905     Lab Results  Component Value Date   WBC 6.5 10/29/2022   HGB 13.2 10/29/2022   HCT 40.5 10/29/2022   MCV 92.0 10/29/2022   PLT 211 10/29/2022    Lab Results  Component Value Date   CHOL 109 05/10/2022   HDL 56.50 05/10/2022   LDLCALC 37 05/10/2022   TRIG 73.0 05/10/2022   CHOLHDL 2 05/10/2022    Lab Results  Component Value Date   HGBA1C 5.4 08/25/2022     Lab Results  Component Value Date   TSH 2.16 03/21/2022      Assessment & Plan:   Acute UTI: urine culture ordered. Will contact with results. Prescribed Diflucan 150 mg once, Macrobid 100 mg twice daily for 7 days. Contact us if symptoms fail to improve.  Addendum: Urine culture had no growth. May have urethritis. Take antibioic as directed.  I,Alexander Ruley,acting as a Neurosurgeon for Margaree Mackintosh, MD.,have documented all relevant documentation on the behalf of Margaree Mackintosh, MD,as directed by  Margaree Mackintosh, MD while in the presence of Margaree Mackintosh, MD.   I, Margaree Mackintosh, MD, have reviewed all documentation for this visit. The documentation on 12/19/22 for the exam, diagnosis, procedures, and orders are all accurate and complete.

## 2022-12-16 NOTE — Patient Instructions (Addendum)
You have been diagnosed with acute urinary tract infection.  Culture has been sent and we will call you with results when available.  Please take Macrobid 100 mg twice daily for 7 days.  Have also sent in Diflucan 150 mg to take 1 tablet one-time dose  if you develop Candida (yeast infection) vaginitis while on antibiotics.  We will advise when we receive culture results if you need to return for follow-up.

## 2022-12-17 LAB — URINE CULTURE
MICRO NUMBER:: 15848484
Result:: NO GROWTH
SPECIMEN QUALITY:: ADEQUATE

## 2022-12-18 ENCOUNTER — Other Ambulatory Visit: Payer: Self-pay | Admitting: Internal Medicine

## 2022-12-19 ENCOUNTER — Encounter: Payer: Self-pay | Admitting: Internal Medicine

## 2022-12-25 ENCOUNTER — Other Ambulatory Visit: Payer: Self-pay | Admitting: Internal Medicine

## 2022-12-27 ENCOUNTER — Other Ambulatory Visit: Payer: Self-pay | Admitting: Internal Medicine

## 2022-12-28 ENCOUNTER — Other Ambulatory Visit: Payer: Self-pay | Admitting: Internal Medicine

## 2023-01-11 DIAGNOSIS — M5414 Radiculopathy, thoracic region: Secondary | ICD-10-CM | POA: Diagnosis not present

## 2023-01-27 ENCOUNTER — Other Ambulatory Visit: Payer: Self-pay | Admitting: Internal Medicine

## 2023-01-27 DIAGNOSIS — M546 Pain in thoracic spine: Secondary | ICD-10-CM | POA: Diagnosis not present

## 2023-01-27 DIAGNOSIS — M542 Cervicalgia: Secondary | ICD-10-CM | POA: Diagnosis not present

## 2023-01-27 DIAGNOSIS — R293 Abnormal posture: Secondary | ICD-10-CM | POA: Diagnosis not present

## 2023-01-30 DIAGNOSIS — M542 Cervicalgia: Secondary | ICD-10-CM | POA: Diagnosis not present

## 2023-01-30 DIAGNOSIS — R293 Abnormal posture: Secondary | ICD-10-CM | POA: Diagnosis not present

## 2023-01-30 DIAGNOSIS — M546 Pain in thoracic spine: Secondary | ICD-10-CM | POA: Diagnosis not present

## 2023-02-02 DIAGNOSIS — M542 Cervicalgia: Secondary | ICD-10-CM | POA: Diagnosis not present

## 2023-02-02 DIAGNOSIS — R293 Abnormal posture: Secondary | ICD-10-CM | POA: Diagnosis not present

## 2023-02-02 DIAGNOSIS — M546 Pain in thoracic spine: Secondary | ICD-10-CM | POA: Diagnosis not present

## 2023-02-07 DIAGNOSIS — M546 Pain in thoracic spine: Secondary | ICD-10-CM | POA: Diagnosis not present

## 2023-02-07 DIAGNOSIS — M542 Cervicalgia: Secondary | ICD-10-CM | POA: Diagnosis not present

## 2023-02-07 DIAGNOSIS — R293 Abnormal posture: Secondary | ICD-10-CM | POA: Diagnosis not present

## 2023-02-09 DIAGNOSIS — M546 Pain in thoracic spine: Secondary | ICD-10-CM | POA: Diagnosis not present

## 2023-02-09 DIAGNOSIS — R293 Abnormal posture: Secondary | ICD-10-CM | POA: Diagnosis not present

## 2023-02-09 DIAGNOSIS — M542 Cervicalgia: Secondary | ICD-10-CM | POA: Diagnosis not present

## 2023-02-16 DIAGNOSIS — M546 Pain in thoracic spine: Secondary | ICD-10-CM | POA: Diagnosis not present

## 2023-02-16 DIAGNOSIS — M542 Cervicalgia: Secondary | ICD-10-CM | POA: Diagnosis not present

## 2023-02-16 DIAGNOSIS — R293 Abnormal posture: Secondary | ICD-10-CM | POA: Diagnosis not present

## 2023-02-21 DIAGNOSIS — R293 Abnormal posture: Secondary | ICD-10-CM | POA: Diagnosis not present

## 2023-02-21 DIAGNOSIS — M546 Pain in thoracic spine: Secondary | ICD-10-CM | POA: Diagnosis not present

## 2023-02-21 DIAGNOSIS — M542 Cervicalgia: Secondary | ICD-10-CM | POA: Diagnosis not present

## 2023-02-24 ENCOUNTER — Other Ambulatory Visit: Payer: Self-pay

## 2023-02-24 DIAGNOSIS — E119 Type 2 diabetes mellitus without complications: Secondary | ICD-10-CM

## 2023-02-27 ENCOUNTER — Other Ambulatory Visit: Payer: BC Managed Care – PPO

## 2023-02-27 DIAGNOSIS — E119 Type 2 diabetes mellitus without complications: Secondary | ICD-10-CM | POA: Diagnosis not present

## 2023-02-28 ENCOUNTER — Encounter: Payer: Self-pay | Admitting: Endocrinology

## 2023-02-28 DIAGNOSIS — M546 Pain in thoracic spine: Secondary | ICD-10-CM | POA: Diagnosis not present

## 2023-02-28 DIAGNOSIS — R293 Abnormal posture: Secondary | ICD-10-CM | POA: Diagnosis not present

## 2023-02-28 DIAGNOSIS — M542 Cervicalgia: Secondary | ICD-10-CM | POA: Diagnosis not present

## 2023-02-28 LAB — HEMOGLOBIN A1C
Hgb A1c MFr Bld: 5.2 %{Hb} (ref <5.7)
Mean Plasma Glucose: 103 mg/dL
eAG (mmol/L): 5.7 mmol/L

## 2023-02-28 LAB — BASIC METABOLIC PANEL
BUN: 11 mg/dL (ref 7–25)
CO2: 28 mmol/L (ref 20–32)
Calcium: 9 mg/dL (ref 8.6–10.4)
Chloride: 104 mmol/L (ref 98–110)
Creat: 0.56 mg/dL (ref 0.50–1.05)
Glucose, Bld: 95 mg/dL (ref 65–99)
Potassium: 4 mmol/L (ref 3.5–5.3)
Sodium: 139 mmol/L (ref 135–146)

## 2023-03-02 DIAGNOSIS — M542 Cervicalgia: Secondary | ICD-10-CM | POA: Diagnosis not present

## 2023-03-02 DIAGNOSIS — M546 Pain in thoracic spine: Secondary | ICD-10-CM | POA: Diagnosis not present

## 2023-03-02 DIAGNOSIS — R293 Abnormal posture: Secondary | ICD-10-CM | POA: Diagnosis not present

## 2023-03-06 ENCOUNTER — Encounter: Payer: Self-pay | Admitting: Endocrinology

## 2023-03-06 ENCOUNTER — Ambulatory Visit: Payer: BC Managed Care – PPO | Admitting: Endocrinology

## 2023-03-06 VITALS — BP 124/68 | HR 84 | Ht 65.0 in | Wt 124.8 lb

## 2023-03-06 DIAGNOSIS — E559 Vitamin D deficiency, unspecified: Secondary | ICD-10-CM | POA: Diagnosis not present

## 2023-03-06 DIAGNOSIS — Z7985 Long-term (current) use of injectable non-insulin antidiabetic drugs: Secondary | ICD-10-CM | POA: Diagnosis not present

## 2023-03-06 DIAGNOSIS — M85859 Other specified disorders of bone density and structure, unspecified thigh: Secondary | ICD-10-CM | POA: Diagnosis not present

## 2023-03-06 DIAGNOSIS — E119 Type 2 diabetes mellitus without complications: Secondary | ICD-10-CM

## 2023-03-06 MED ORDER — TIRZEPATIDE 5 MG/0.5ML ~~LOC~~ SOAJ: 5.0000 mg | SUBCUTANEOUS | 4 refills | Status: DC

## 2023-03-06 NOTE — Progress Notes (Signed)
 Outpatient Endocrinology Note [place] Gaylia Kassel, MD   Patient's Name: Melissa Benson    DOB: 03/09/1960    MRN: [id number]                                                    REASON OF VISIT: Follow up for type 2 diabetes mellitus  PCP: Margaree Mackintosh, MD  HISTORY OF PRESENT ILLNESS:   Melissa Benson is a 63 y.o. old female with past medical history listed below, is here for follow up for type 2 diabetes mellitus.   Pertinent Diabetes History: Patient was previously seen by Dr. Lucianne Muss and was last time seen in August 2024.  Patient was diagnosed with type 2 diabetes mellitus in 2009.  Patient was treated with various oral hypoglycemic drugs and was also relatively on higher dose of basal insulin in the past.  Restart of Victoza she was able to gradually cut down on insulin requirement and also lost significant amount of weight.  She had gastric bypass surgery in 2017 and was able to stop insulin.  She had tried metformin alone in 2018 for about a year and was ultimately stopped.  Chronic Diabetes Complications : Retinopathy: no. Last ophthalmology exam was done on annually 06/2022, following with ophthalmology regularly.  Nephropathy: no, on ACE/ARB /ramipril. Peripheral neuropathy: Mild tingling of the toes.  On gabapentin primarily for back pain and the leg pain. Coronary artery disease: no Stroke: no  Relevant comorbidities and cardiovascular risk factors: Obesity: no Body mass index is 20.77 kg/m.  Status post gastric bypass surgery in 2017. Hypertension: Yes  Hyperlipidemia : Yes, on statin   Current / Home Diabetic regimen includes:  Mounjaro 7.5 mg weekly.  Prior diabetic medications: Various oral antidiabetic medication and basal insulin in the past.  Nausea and diarrhea with metformin.  Nausea from glipizide.  Glycemic data:   Ascensia Contour next 1 glucometer, download from February 17 to March 3, average blood sugar 1 1, lowest blood sugar 76 and highest 139.   She has been checking once a day daily in the morning fasting somewhat out of blood sugar 76, 91, 122, 94, 93, 139.  Hypoglycemia: Patient has no hypoglycemic episodes. Patient has hypoglycemia awareness.  Factors modifying glucose control: 1.  Diabetic diet assessment: 3 meals a day.  2.  Staying active or exercising: Walking.  3.  Medication compliance: compliant all of the time.  # Osteopenia : Last DEXA scan in April 2023 T score is -1.3 at the hip and previously in 2021 was - 1.4, FRAX score: 10 year major osteoporotic risk: 13.4%. 10 year hip fracture risk: 1.0%.   Takes Vitamin D   Interval history  Omitted data as reviewed above.  Hemoglobin A1c 5.2%.  She lost about 10 pounds of weight in 4 months.  She is on  Mounjaro 7.5 mg weekly, she occasionally complains of abdominal pain while eating.  Denies nausea and vomiting.  Her BMI 20.  No other complaints today.  REVIEW OF SYSTEMS As per history of present illness.   PAST MEDICAL HISTORY: Past Medical History:  Diagnosis Date   Anxiety    Complication of anesthesia    Diabetes mellitus    GERD (gastroesophageal reflux disease)    Headache    no issues since stop caffeine   History of  hiatal hernia    History of migraine headaches    History of urinary tract infection    last one 11/2014    History of vertigo    Hyperlipidemia    Hypertension    Neck pain, chronic    OSA (obstructive sleep apnea)    on CPAP   PONV (postoperative nausea and vomiting)    Psoriasis    Rosacea    Weak urinary stream     PAST SURGICAL HISTORY: Past Surgical History:  Procedure Laterality Date   2D ECHOCARDIOGRAM  07/20/2011   EF >55%, normal   ABDOMINAL HYSTERECTOMY     APPENDECTOMY     CARDIOVASCULAR STRESS TEST  07/20/2011   Normal myocardial perfusion study, EKG negative for ischemia, no ECG changes   CHOLECYSTECTOMY     colonscopy      polyps removed    DIAGNOSTIC LAPAROSCOPY     DILATION AND CURETTAGE OF UTERUS      LAPAROSCOPIC ROUX-EN-Y GASTRIC BYPASS WITH HIATAL HERNIA REPAIR N/A 01/12/2015   Procedure: LAPAROSCOPIC ROUX-EN-Y GASTRIC BYPASS WITH LYSIS OF ADHESIONS ;  Surgeon: Luretha Murphy, MD;  Location: [facility];  Service: General;  Laterality: N/A;   LAPAROSCOPY  02/01/2017   Laparoscopic lysis of adhesions   LAPAROSCOPY N/A 02/01/2017   Procedure: LAPAROSCOPY Lysis of adhesions;  Surgeon: Berna Bue, MD;  Location: [facility];  Service: General;  Laterality: N/A;   LAPAROTOMY N/A 02/01/2017   Procedure: excision of umbilical mass, small bowel resection;  Surgeon: Berna Bue, MD;  Location: [facility];  Service: General;  Laterality: N/A;   SPINE SURGERY     C6-C7 fusion x2   SPINE SURGERY     herniated disc L4-L5   TRIGGER FINGER RELEASE Left 03/14/2019   Procedure: RELEASE TRIGGER FINGER/A-1 PULLEY LEFT LONG FINGER;  Surgeon: Betha Loa, MD;  Location:  SURGERY CENTER;  Service: Orthopedics;  Laterality: Left;  Bier block   TUBAL LIGATION      ALLERGIES: Allergies  Allergen Reactions   Morphine And Codeine Nausea And Vomiting    Pt states also caused arm to turn black where medication was administered    Morphine Other (See Comments)   Prednisone Hives   Other Nausea And Vomiting    TYLOX    FAMILY HISTORY:  Family History  Problem Relation Age of Onset   Diabetes Mother    Heart disease Mother    Hypertension Mother    Lung disease Father    Heart disease Father    Hypertension Father    Diabetes Father    Hypertension Sister    Hyperlipidemia Brother    Hypertension Brother    Heart attack Maternal Grandmother    Heart disease Maternal Grandmother    Hypertension Maternal Grandfather    Diabetes Maternal Grandfather    Heart disease Maternal Grandfather     SOCIAL HISTORY: Social History   Socioeconomic History   Marital status: Married    Spouse name: Not on file   Number of children: 2   Years of education: college   Highest education level: Associate  degree: academic program  Occupational History   Occupation: Corporate treasurer  Tobacco Use   Smoking status: Former    Current packs/day: 0.00    Average packs/day: 2.5 packs/day for 8.0 years (20.0 ttl pk-yrs)    Types: Cigarettes    Start date: 05/18/1975    Quit date: 05/18/1983    Years since quitting: 39.8   Smokeless tobacco: Never  Vaping Use   Vaping status: Never Used  Substance and Sexual Activity   Alcohol use: No    Alcohol/week: 0.0 standard drinks of alcohol   Drug use: No   Sexual activity: Never    Birth control/protection: Abstinence  Other Topics Concern   Not on file  Social History Narrative   Lives with husband and one of her children in a one story home.  Has 2 children.     Works as a Corporate treasurer at [employer].  Education: college.   Social Drivers of Corporate investment banker Strain: Low Risk  (11/03/2022)   Overall Financial Resource Strain (CARDIA)    Difficulty of Paying Living Expenses: Not very hard  Food Insecurity: No Food Insecurity (11/03/2022)   Hunger Vital Sign    Worried About Running Out of Food in the Last Year: Never true    Ran Out of Food in the Last Year: Never true  Transportation Needs: No Transportation Needs (11/03/2022)   PRAPARE - Administrator, Civil Service (Medical): No    Lack of Transportation (Non-Medical): No  Physical Activity: Inactive (11/03/2022)   Exercise Vital Sign    Days of Exercise per Week: 0 days    Minutes of Exercise per Session: 30 min  Stress: Stress Concern Present (11/03/2022)   [employer] of Occupational Health - Occupational Stress Questionnaire    Feeling of Stress : Very much  Social Connections: Socially Integrated (11/03/2022)   Social Connection and Isolation Panel [NHANES]    Frequency of Communication with Friends and Family: More than three times a week    Frequency of Social Gatherings with Friends and Family: More than three times a week    Attends Religious Services:  More than 4 times per year    Active Member of [employer] or Organizations: Yes    Attends Engineer, structural: More than 4 times per year    Marital Status: Married    MEDICATIONS:  Current Outpatient Medications  Medication Sig Dispense Refill   ALPRAZolam (XANAX) 0.5 MG tablet TAKE 1 TABLET(0.5 MG) BY MOUTH TWICE DAILY AS NEEDED FOR ANXIETY 60 tablet 5   b complex vitamins capsule Take 1 capsule by mouth daily.     cyclobenzaprine (FLEXERIL) 10 MG tablet TAKE 1 TABLET(10 MG) BY MOUTH AT BEDTIME 90 tablet 2   fluticasone (FLONASE) 50 MCG/ACT nasal spray Place 2 sprays into both nostrils daily. 16 g 0   glucose blood (CONTOUR NEXT TEST) test strip USE AS DIRECTED TO TEST BLOOD GLUCOSE ONCE DAILY 100 strip 3   linaclotide (LINZESS) 72 MCG capsule Take 72 mcg by mouth daily.     multivitamin (VIT W/EXTRA C) CHEW chewable tablet Chew 1 tablet by mouth daily.     promethazine (PHENERGAN) 25 MG tablet TAKE 1 TABLET BY MOUTH EVERY 8 HOURS AS NEEDED FOR NAUSEA 90 tablet 0   ramipril (ALTACE) 5 MG capsule TAKE 1 CAPSULE(5 MG) BY MOUTH DAILY 30 capsule 3   rosuvastatin (CRESTOR) 5 MG tablet TAKE 1 TABLET(5 MG) BY MOUTH DAILY 90 tablet 3   tirzepatide (MOUNJARO) 5 MG/0.5ML Pen Inject 5 mg into the skin once a week. 6 mL 4   valACYclovir (VALTREX) 500 MG tablet TAKE 1 TABLET(500 MG) BY MOUTH TWICE DAILY AS NEEDED FOR COLD SORES OR OUTBREAK 10 tablet 3   nitrofurantoin, macrocrystal-monohydrate, (MACROBID) 100 MG capsule Take 1 capsule (100 mg total) by mouth 2 (two) times daily. (Patient not taking: Reported on 03/06/2023)  14 capsule 0   ramipril (ALTACE) 5 MG capsule TAKE 1 CAPSULE(5 MG) BY MOUTH DAILY 30 capsule 3   No current facility-administered medications for this visit.    PHYSICAL EXAM: Vitals:   03/06/23 0811  BP: 124/68  Pulse: 84  SpO2: 98%  Weight: 124 lb 12.8 oz (56.6 kg)  Height: 5\' 5"  (1.651 m)   Body mass index is 20.77 kg/m.  Wt Readings from Last 3 Encounters:   03/06/23 124 lb 12.8 oz (56.6 kg)  12/16/22 135 lb (61.2 kg)  11/22/22 132 lb 9.6 oz (60.1 kg)    General: Well developed, well nourished female in no apparent distress.  HEENT: AT/[place], no external lesions.  Eyes: Conjunctiva clear and no icterus. Neck: Neck supple  Lungs: Respirations not labored Neurologic: Alert, oriented, normal speech Extremities / Skin: Dry. No sores or rashes noted. Psychiatric: Does not appear depressed or anxious  Diabetic Foot Exam - Simple   No data filed    LABS Reviewed Lab Results  Component Value Date   HGBA1C 5.2 02/27/2023   HGBA1C 5.4 08/25/2022   HGBA1C 5.5 05/10/2022   Lab Results  Component Value Date   FRUCTOSAMINE 222 09/10/2020   FRUCTOSAMINE 263 03/19/2019   Lab Results  Component Value Date   CHOL 109 05/10/2022   HDL 56.50 05/10/2022   LDLCALC 37 05/10/2022   TRIG 73.0 05/10/2022   CHOLHDL 2 05/10/2022   Lab Results  Component Value Date   MICRALBCREAT 6 03/22/2022   MICRALBCREAT 0.9 11/03/2021   Lab Results  Component Value Date   CREATININE 0.56 02/27/2023   Lab Results  Component Value Date   GFR 87.98 08/25/2022    ASSESSMENT / PLAN  1. Type 2 diabetes mellitus without complication, without long-term current use of insulin (HCC)   2. Osteopenia of hip, unspecified laterality   3. Vitamin D deficiency     Diabetes Mellitus type 2, complicated by no known complications. - Diabetic status / severity: Controlled.  Lab Results  Component Value Date   HGBA1C 5.2 02/27/2023    - Hemoglobin A1c goal : <6.5%  Patient has been losing weight, BMI 20.  Occasionally complains of abdominal pain.  Will decrease the dose of Mounjaro as follows.  - Medications: See below.  I) decrease Mounjaro from 7.5 to 5 mg weekly.  - Home glucose testing: In the morning fasting daily. - Discussed/ Gave Hypoglycemia treatment plan.  # Consult : not required at this time.   # Annual urine for microalbuminuria/ creatinine  ratio, no microalbuminuria currently, continue ACE/ARB /ramipril. Last  Lab Results  Component Value Date   MICRALBCREAT 6 03/22/2022    # Foot check nightly.  # Annual dilated diabetic eye exams.   - Diet: Make healthy diabetic food choices - Life style / activity / exercise: Discussed.  2. Blood pressure  -  BP Readings from Last 1 Encounters:  03/06/23 124/68    - Control is in target.  - No change in current plans.  3. Lipid status / Hyperlipidemia - Last  Lab Results  Component Value Date   LDLCALC 37 05/10/2022   - Continue rosuvastatin 5 mg daily.  Managed by primary care provider.  # Osteopenia : Last DEXA scan was in April 2023.  Continue current intake of vitamin D and calcium supplement. -Check vitamin D with next set of lab.  Melissa Benson was seen today for follow-up.  Diagnoses and all orders for this visit:  Type 2 diabetes mellitus without  complication, without long-term current use of insulin (HCC) -     tirzepatide (MOUNJARO) 5 MG/0.5ML Pen; Inject 5 mg into the skin once a week. -     Microalbumin / creatinine urine ratio -     BASIC METABOLIC PANEL WITH GFR -     Hemoglobin A1c  Osteopenia of hip, unspecified laterality -     VITAMIN D 25 Hydroxy (Vit-D Deficiency, Fractures)  Vitamin D deficiency -     VITAMIN D 25 Hydroxy (Vit-D Deficiency, Fractures)    DISPOSITION Follow up in clinic in 4 months suggested.   All questions answered and patient verbalized understanding of the plan.  [place] Kaitlynd Phillips, MD [facility] Endocrinology [facility] [address] [place], [address] [place], [place] [address] Phone # [phone]  At least part of this note was generated using voice recognition software. Inadvertent word errors may have occurred, which were not recognized during the proofreading process.

## 2023-03-07 ENCOUNTER — Ambulatory Visit: Admitting: Internal Medicine

## 2023-03-07 DIAGNOSIS — M546 Pain in thoracic spine: Secondary | ICD-10-CM | POA: Diagnosis not present

## 2023-03-07 DIAGNOSIS — M542 Cervicalgia: Secondary | ICD-10-CM | POA: Diagnosis not present

## 2023-03-07 DIAGNOSIS — R293 Abnormal posture: Secondary | ICD-10-CM | POA: Diagnosis not present

## 2023-03-09 DIAGNOSIS — M542 Cervicalgia: Secondary | ICD-10-CM | POA: Diagnosis not present

## 2023-03-09 DIAGNOSIS — M546 Pain in thoracic spine: Secondary | ICD-10-CM | POA: Diagnosis not present

## 2023-03-09 DIAGNOSIS — R293 Abnormal posture: Secondary | ICD-10-CM | POA: Diagnosis not present

## 2023-03-14 DIAGNOSIS — M542 Cervicalgia: Secondary | ICD-10-CM | POA: Diagnosis not present

## 2023-03-14 DIAGNOSIS — M546 Pain in thoracic spine: Secondary | ICD-10-CM | POA: Diagnosis not present

## 2023-03-14 DIAGNOSIS — R293 Abnormal posture: Secondary | ICD-10-CM | POA: Diagnosis not present

## 2023-03-16 DIAGNOSIS — M546 Pain in thoracic spine: Secondary | ICD-10-CM | POA: Diagnosis not present

## 2023-03-16 DIAGNOSIS — R293 Abnormal posture: Secondary | ICD-10-CM | POA: Diagnosis not present

## 2023-03-16 DIAGNOSIS — M542 Cervicalgia: Secondary | ICD-10-CM | POA: Diagnosis not present

## 2023-03-20 ENCOUNTER — Other Ambulatory Visit: Payer: Self-pay

## 2023-03-21 ENCOUNTER — Other Ambulatory Visit: Payer: BC Managed Care – PPO

## 2023-03-21 DIAGNOSIS — E785 Hyperlipidemia, unspecified: Secondary | ICD-10-CM | POA: Diagnosis not present

## 2023-03-21 DIAGNOSIS — E1169 Type 2 diabetes mellitus with other specified complication: Secondary | ICD-10-CM | POA: Diagnosis not present

## 2023-03-21 DIAGNOSIS — Z0000 Encounter for general adult medical examination without abnormal findings: Secondary | ICD-10-CM

## 2023-03-21 DIAGNOSIS — E119 Type 2 diabetes mellitus without complications: Secondary | ICD-10-CM

## 2023-03-21 DIAGNOSIS — I10 Essential (primary) hypertension: Secondary | ICD-10-CM

## 2023-03-21 DIAGNOSIS — R293 Abnormal posture: Secondary | ICD-10-CM | POA: Diagnosis not present

## 2023-03-21 DIAGNOSIS — M546 Pain in thoracic spine: Secondary | ICD-10-CM | POA: Diagnosis not present

## 2023-03-21 DIAGNOSIS — M542 Cervicalgia: Secondary | ICD-10-CM | POA: Diagnosis not present

## 2023-03-21 NOTE — Progress Notes (Signed)
 Lab only

## 2023-03-22 LAB — CBC
HCT: 34.2 % — ABNORMAL LOW (ref 35.0–45.0)
Hemoglobin: 11.3 g/dL — ABNORMAL LOW (ref 11.7–15.5)
MCH: 31.2 pg (ref 27.0–33.0)
MCHC: 33 g/dL (ref 32.0–36.0)
MCV: 94.5 fL (ref 80.0–100.0)
MPV: 9.5 fL (ref 7.5–12.5)
Platelets: 233 10*3/uL (ref 140–400)
RBC: 3.62 10*6/uL — ABNORMAL LOW (ref 3.80–5.10)
RDW: 12.5 % (ref 11.0–15.0)
WBC: 3.8 10*3/uL (ref 3.8–10.8)

## 2023-03-22 LAB — COMPLETE METABOLIC PANEL WITH GFR
AG Ratio: 2.1 (calc) (ref 1.0–2.5)
ALT: 35 U/L — ABNORMAL HIGH (ref 6–29)
AST: 35 U/L (ref 10–35)
Albumin: 4.2 g/dL (ref 3.6–5.1)
Alkaline phosphatase (APISO): 80 U/L (ref 37–153)
BUN: 10 mg/dL (ref 7–25)
CO2: 29 mmol/L (ref 20–32)
Calcium: 9.1 mg/dL (ref 8.6–10.4)
Chloride: 104 mmol/L (ref 98–110)
Creat: 0.6 mg/dL (ref 0.50–1.05)
Globulin: 2 g/dL (ref 1.9–3.7)
Glucose, Bld: 93 mg/dL (ref 65–99)
Potassium: 4.2 mmol/L (ref 3.5–5.3)
Sodium: 141 mmol/L (ref 135–146)
Total Bilirubin: 0.5 mg/dL (ref 0.2–1.2)
Total Protein: 6.2 g/dL (ref 6.1–8.1)

## 2023-03-22 LAB — HEMOGLOBIN A1C
Hgb A1c MFr Bld: 5.3 %{Hb} (ref <5.7)
Mean Plasma Glucose: 105 mg/dL
eAG (mmol/L): 5.8 mmol/L

## 2023-03-22 LAB — LIPID PANEL
Cholesterol: 112 mg/dL (ref <200)
HDL: 62 mg/dL (ref >=50)
LDL Cholesterol (Calc): 36 mg/dL
Non-HDL Cholesterol (Calc): 50 mg/dL (ref <130)
Total CHOL/HDL Ratio: 1.8 (calc) (ref <5.0)
Triglycerides: 56 mg/dL (ref <150)

## 2023-03-22 LAB — TSH: TSH: 2.25 m[IU]/L (ref 0.40–4.50)

## 2023-03-22 NOTE — Progress Notes (Signed)
 Annual Wellness Visit   Patient Care Team: Margaree Mackintosh, MD as PCP - General  Visit Date: 03/24/23   Chief Complaint  Patient presents with   Annual Exam   Subjective:  Patient: Melissa Benson, Female DOB: 06-Jan-1960, 63 y.o. MRN: [id number] BP Readings from Last 1 Encounters:  03/24/23 124/82   Melissa Benson is a 63 y.o. Female who presents today for her Annual Wellness Visit. Patient has history of Diabetes Mellitus, Hyperlipidemia, Sleep Apnea, Hypertension, Migraine Headaches, and Paresthesias in Feet Secondary to Lumbar Disc Disease.  History of Hypertension treated with Altace 5 mg daily. Blood pressure normal today at 124/82.  History of Hyperlipidemia treated with Rosuvastatin 5 mg daily. 03/21/2023 Lipid Panel: WNL.    History of Diabetes Mellitus, type II treated with Mounjaro 5 mg weekly. Followed by Endocrinologist, Dr. Iraq Thapa, who she last saw 03/06/2023. 03/21/2023 HgbA1c: 5.3. Diabetic kidney eval could not be completed at their office as she couldn't provide a specimen, but today she was able to so Urine Microalb/Creat Ratio ordered.    History of Anxiety treated with Xanax 0.5 mg twice daily as needed. She used to see Dr. Emerson Monte for depression, but is no longer receiving therapy, and Dr. Nolen Mu is no longer in private practice.   History of Neck Pain treated with Flexeril 10 mg at bedtime. Says that she has also been having back and rib pain that she has been to physical therapy for 17 sessions since the end of January. Requests standing desk to use at work - note provided.   History of Elevated Liver Enzymes. Previously advised her to stop taking Tylenol and NSAIDs and liver function subsequently normalized. Elevated ALT 03/21/2023 was 35, elevated from 20 in 03/2022. Says that she had stopped taking Gabapentin, which seem to correlate with when her ALT was 164 & 216 in 10-11/2022 and started decreasing.  Labs 03/21/2023 CBC, compared to 03/2022:  RBC 3.62, decreased from 3.98; Hgb 11.3, decreased from 12.2; HCT 34.2, decreased from 37.4. CMP, compared to 03/2022: ALT 35, elevated from 20; otherwise WNL.   TSH: 2.25  PAP Smear 01/18/2017 normal; s/p hysterectomy. Patient has GYN physician, pelvic exam deferred.  Mammogram 04/15/22 normal with repeat recommendation 2025.   Colonoscopy 08/08/2022 with few small mouth diverticula in the entire colon and small internal hemorrhoids. Otherwise normal 2034.   Bone Density 04/28/21 T-score Right Femoral Neck  -1.3, osteopenic. Osteopenia managed with Vitamin-D and Calcium supplement.   Vaccine Counseling: UTD on Flu, Covid-19, Shingles, PNA, and Tdap - Covid-19 upcoming 5/16, Tdap 2027.  Past Medical History:  Diagnosis Date   Anxiety    Complication of anesthesia    Diabetes mellitus    GERD (gastroesophageal reflux disease)    Headache    no issues since stop caffeine   History of hiatal hernia    History of migraine headaches    History of urinary tract infection    last one 11/2014    History of vertigo    Hyperlipidemia    Hypertension    Neck pain, chronic    OSA (obstructive sleep apnea)    on CPAP   PONV (postoperative nausea and vomiting)    Psoriasis    Rosacea    Weak urinary stream   Medical/Surgical History Narrative:   2024 - Covid-19 in December  Says memory symptoms worse after Covid-19 in November 2022. Mother had memory loss. Had CT of brain 2017 that was normal after having severe headache  and dizziness.     Patient says she will be seeing Dr. Loreta Benson soon for GI evaluation. 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 Having some epigastric discomfort. Had endoscopy and colonoscopy at [facility] 2019. No worrisome findings.   History of ESBL E. coli UTI in October 2021.   Hx of AP fusion of C6-C7. Had MRI C-spine after episode of LOC and posterior neck pain 2019 showing mild edema T2 superior endplate with minimal anterior loss of height of the vertebral  body c/w acute compression fracture.  In January 2019 she was admitted for lysis of adhesions, excision of umbilical mass and small bowel resection.  Surgery was done by Dr. Doylene Canard, general surgeon.  She presented to the hospital with small bowel obstruction.   In November 2019 she suffered a T10 fracture with syncopal episode.  This occurred while taking a prep for colonoscopy and felt acutely nauseated.  Due to this episode colonoscopy was postponed until January 01, 2018.  That study showed diverticulosis and 5-year follow-up recommended.  Upper endoscopy on the same day showed gastric bypass surgery with a small pouch and no lesions of concern.   Hx gastric bypass surgery. Dr. Ewing Schlein gave her Carafate in 2020 for epigastric pain. Sometimes takes antacid .   History of recurrent urinary infections but none recently.  Reports having had migraines starting in her late 30s.  Had issues with chronic daily headache for some 20 years.  Tried Topamax.  She stopped Topamax because she thought it made her jittery.  CT of the brain in 2017 was normal.  She has been a patient here since 2006.  At that time she weighed 251 pounds.  She weighed 260 pounds in 2009.  Hemoglobin A1c was 9.1% in 2009.  In 2010 she was started on insulin.  In 2014 she lost down to 223 pounds.  In 2017, after gastric bypass surgery, she weighed 173 pounds.  In January 2017 she was found to have T wave inversions on EKG with family history of coronary disease in her mother.  She was referred to cardiologist and underwent a nuclear medicine study which was normal in February 2015. Family History  Problem Relation Age of Onset   Diabetes Mother    Heart disease Mother    Hypertension Mother    Lung disease Father    Heart disease Father    Hypertension Father    Diabetes Father    Hypertension Sister    Hyperlipidemia Brother    Hypertension Brother    Heart attack Maternal Grandmother    Heart disease Maternal  Grandmother    Hypertension Maternal Grandfather    Diabetes Maternal Grandfather    Heart disease Maternal Grandfather   Family History Narrative: Father with history of diabetes mellitus.   Mother deceased with history of heart disease and Alzheimer's disease.   3 brothers, all of them have had substance abuse issues. There is 1 brother with history of stroke perhaps related to substance abuse according to the patient.   2 sisters, 1 of whom is a diabetic and 1 had gastric sleeve surgery with history of breast cancer.   Social History   Social History Narrative   Lives with husband and one of her children in a one story home.  Has 2 children.     Works as a Corporate treasurer at [employer].  Education: college.  Social history: Married.  Has 2 sons.  Does not smoke. From time to time, has had  situational stress with family but that seems to have improved. Patient's mother passed away under hospice care in 2020.   Review of Systems  Constitutional:  Negative for chills, fever, malaise/fatigue and weight loss.  HENT:  Negative for hearing loss, sinus pain and sore throat.   Respiratory:  Negative for cough, hemoptysis and shortness of breath.   Cardiovascular:  Negative for chest pain, palpitations, leg swelling and PND.  Gastrointestinal:  Negative for abdominal pain, constipation, diarrhea, heartburn, nausea and vomiting.  Genitourinary:  Negative for dysuria, frequency and urgency.  Musculoskeletal:  Positive for back pain (& Ribs) and neck pain. Negative for myalgias.  Skin:  Negative for itching and rash.  Neurological:  Negative for dizziness, tingling, seizures and headaches.  Endo/Heme/Allergies:  Negative for polydipsia.  Psychiatric/Behavioral:  Negative for depression. The patient is not nervous/anxious.     Objective:  Vitals: BP 124/82   Pulse 78   Temp 98 F (36.7 C)   Ht 5' 4.5" (1.638 m)   Wt 125 lb (56.7 kg)   SpO2 97%   BMI 21.12 kg/m  Physical Exam Vitals and nursing  note reviewed.  Constitutional:      General: She is not in acute distress.    Appearance: Normal appearance. She is not ill-appearing or toxic-appearing.  HENT:     Head: Normocephalic and atraumatic.     Right Ear: Hearing, tympanic membrane, ear canal and external ear normal.     Left Ear: Hearing, tympanic membrane, ear canal and external ear normal.     Mouth/Throat:     Pharynx: Oropharynx is clear.  Eyes:     Extraocular Movements: Extraocular movements intact.     Pupils: Pupils are equal, round, and reactive to light.  Neck:     Thyroid: No thyroid mass, thyromegaly or thyroid tenderness.     Vascular: No carotid bruit.  Cardiovascular:     Rate and Rhythm: Normal rate and regular rhythm. No extrasystoles are present.    Pulses:          Dorsalis pedis pulses are 2+ on the right side and 2+ on the left side.       Posterior tibial pulses are 2+ on the right side and 2+ on the left side.     Heart sounds: Normal heart sounds. No murmur heard.    No friction rub. No gallop.  Pulmonary:     Effort: Pulmonary effort is normal.     Breath sounds: Normal breath sounds. No decreased breath sounds, wheezing, rhonchi or rales.  Chest:     Chest wall: No mass.  Abdominal:     Palpations: Abdomen is soft. There is no hepatomegaly, splenomegaly or mass.     Tenderness: There is no abdominal tenderness.     Hernia: No hernia is present.  Musculoskeletal:     Cervical back: Normal range of motion.     Right lower leg: No edema.     Left lower leg: No edema.     Right foot: No deformity.     Left foot: No deformity.  Feet:     Right foot:     Skin integrity: Skin integrity normal.     Toenail Condition: Right toenails are normal.     Left foot:     Skin integrity: Skin integrity normal.     Toenail Condition: Left toenails are normal.  Lymphadenopathy:     Cervical: No cervical adenopathy.     Upper Body:  Right upper body: No supraclavicular adenopathy.     Left upper  body: No supraclavicular adenopathy.  Skin:    General: Skin is warm and dry.  Neurological:     General: No focal deficit present.     Mental Status: She is alert and oriented to person, place, and time. Mental status is at baseline.     Sensory: Sensation is intact.     Motor: Motor function is intact. No weakness.     Deep Tendon Reflexes: Reflexes are normal and symmetric.  Psychiatric:        Attention and Perception: Attention normal.        Mood and Affect: Mood normal.        Speech: Speech normal.        Behavior: Behavior normal.        Thought Content: Thought content normal.        Cognition and Memory: Cognition normal.        Judgment: Judgment normal.   Most Recent Fall Risk Assessment:    03/22/2022   10:10 AM  Fall Risk   Falls in the past year? 0  Number falls in past yr: 0  Injury with Fall? 0  Risk for fall due to : No Fall Risks  Follow up Falls prevention discussed   Most Recent Depression Screenings:    03/22/2022   10:10 AM 10/22/2021   12:36 PM  PHQ 2/9 Scores  PHQ - 2 Score 0 0   Results:  Studies Obtained And Personally Reviewed By Me: Diabetic Foot Exam - Simple   Simple Foot Form Diabetic Foot exam was performed with the following findings: Yes 03/24/2023  2:28 PM  Visual Inspection No deformities, no ulcerations, no other skin breakdown bilaterally: Yes Sensation Testing Intact to touch and monofilament testing bilaterally: Yes Pulse Check Posterior Tibialis and Dorsalis pulse intact bilaterally: Yes Comments    PAP Smear 01/18/2017 normal; s/p hysterectomy.  Mammogram 04/15/22 normal with repeat recommendation 2025.   Colonoscopy 08/08/2022 with few small mouth diverticula in the entire colon and small internal hemorrhoids. Otherwise normal 2034.   Bone Density 04/28/21 T-score Right Femoral Neck  -1.3, osteopenic. Osteopenia managed with   Vaccine Counseling: UTD on Flu, Covid-19, Shingles, PNA, and Tdap - Covid-19 upcoming 5/16, Tdap  2027.   Labs:     Component Value Date/Time   NA 141 03/21/2023 1222   K 4.2 03/21/2023 1222   CL 104 03/21/2023 1222   CO2 29 03/21/2023 1222   GLUCOSE 93 03/21/2023 1222   BUN 10 03/21/2023 1222   CREATININE 0.60 03/21/2023 1222   CALCIUM 9.1 03/21/2023 1222   PROT 6.2 03/21/2023 1222   ALBUMIN 4.0 12/05/2022 0820   AST 35 03/21/2023 1222   ALT 35 (H) 03/21/2023 1222   ALKPHOS 83 12/05/2022 0820   BILITOT 0.5 03/21/2023 1222   GFRNONAA >60 10/29/2022 0956   GFRNONAA 81 12/03/2019 0905   GFRAA 94 12/03/2019 0905    Lab Results  Component Value Date   WBC 3.8 03/21/2023   HGB 11.3 (L) 03/21/2023   HCT 34.2 (L) 03/21/2023   MCV 94.5 03/21/2023   PLT 233 03/21/2023   Lab Results  Component Value Date   CHOL 112 03/21/2023   HDL 62 03/21/2023   LDLCALC 36 03/21/2023   TRIG 56 03/21/2023   CHOLHDL 1.8 03/21/2023   Lab Results  Component Value Date   HGBA1C 5.3 03/21/2023    Lab Results  Component Value Date  TSH 2.25 03/21/2023    Assessment & Plan:   Orders Placed This Encounter  Procedures   Urine Microalbumin w/creat. ratio   POCT Urinalysis Dipstick (Automated)  Other Labs Reviewed today: CBC, compared to 03/2022: RBC 3.62, decreased from 3.98; Hgb 11.3, decreased from 12.2; HCT 34.2, decreased from 37.4. CMP, compared to 03/2022: ALT 35, elevated from 20; otherwise WNL.   TSH: 2.25  Hypertension treated with Altace 5 mg daily. Blood pressure normal today at 124/82.  Hyperlipidemia treated with Rosuvastatin 5 mg daily. 03/21/2023 Lipid Panel: WNL.    Diabetes Mellitus, type II treated with Mounjaro 5 mg weekly. Followed by Endocrinologist, Dr. Iraq Thapa, who she last saw 03/06/2023. 03/21/2023 HgbA1c: 5.3. Diabetic kidney eval could not be completed at their office as she couldn't provide a specimen, but today she was able to so Urine Microalb/Creat Ratio ordered.    Anxiety treated with Xanax 0.5 mg twice daily as needed.   Neck Pain treated with  Flexeril 10 mg at bedtime.   Back & Rib Pain that she has been to physical therapy for 17 sessions since the end of January for. Requests standing desk to use at work - note provided.   Elevated ALT 03/21/2023 was 35, elevated from 20 in 03/2022. Says that she had stopped taking Gabapentin, which seem to correlate with when her ALT was 164 & 216 in 10-11/2022 and started decreasing.  PAP Smear 01/18/2017 normal; s/p hysterectomy. Has a gyn, pelvic exam deferred.   Mammogram 04/15/22 normal with repeat recommendation 2025.   Colonoscopy 08/08/2022 with few small mouth diverticula in the entire colon and small internal hemorrhoids. Otherwise normal 2034.   Bone Density 04/28/21 T-score Right Femoral Neck  -1.3, osteopenic. Osteopenia managed with Vitamin-D and Calcium supplement.    Vaccine Counseling: UTD on Flu, Covid-19, Shingles, PNA, and Tdap - Covid-19 upcoming 5/16, Tdap 2027.    Annual wellness visit done today including the all of the following: Reviewed patient's Family Medical History Reviewed and updated list of patient's medical providers Assessment of cognitive impairment was done Assessed patient's functional ability Established a written schedule for health screening services Health Risk Assessent Completed and Reviewed  Discussed health benefits of physical activity, and encouraged her to engage in regular exercise appropriate for her age and condition.    I,Emily Lagle,acting as a Neurosurgeon for Margaree Mackintosh, MD.,have documented all relevant documentation on the behalf of Margaree Mackintosh, MD,as directed by  Margaree Mackintosh, MD while in the presence of Margaree Mackintosh, MD.   I, Margaree Mackintosh, MD, have reviewed all documentation for this visit. The documentation on 04/02/23 for the exam, diagnosis, procedures, and orders are all accurate and complete.

## 2023-03-23 ENCOUNTER — Encounter: Payer: BC Managed Care – PPO | Admitting: Internal Medicine

## 2023-03-23 DIAGNOSIS — R293 Abnormal posture: Secondary | ICD-10-CM | POA: Diagnosis not present

## 2023-03-23 DIAGNOSIS — M546 Pain in thoracic spine: Secondary | ICD-10-CM | POA: Diagnosis not present

## 2023-03-23 DIAGNOSIS — M542 Cervicalgia: Secondary | ICD-10-CM | POA: Diagnosis not present

## 2023-03-24 ENCOUNTER — Encounter: Payer: Self-pay | Admitting: Internal Medicine

## 2023-03-24 ENCOUNTER — Ambulatory Visit (INDEPENDENT_AMBULATORY_CARE_PROVIDER_SITE_OTHER): Admitting: Internal Medicine

## 2023-03-24 VITALS — BP 124/82 | HR 78 | Temp 98.0°F | Ht 64.5 in | Wt 125.0 lb

## 2023-03-24 DIAGNOSIS — E1169 Type 2 diabetes mellitus with other specified complication: Secondary | ICD-10-CM | POA: Diagnosis not present

## 2023-03-24 DIAGNOSIS — E119 Type 2 diabetes mellitus without complications: Secondary | ICD-10-CM | POA: Diagnosis not present

## 2023-03-24 DIAGNOSIS — Z0000 Encounter for general adult medical examination without abnormal findings: Secondary | ICD-10-CM

## 2023-03-24 DIAGNOSIS — E785 Hyperlipidemia, unspecified: Secondary | ICD-10-CM

## 2023-03-24 DIAGNOSIS — Z8659 Personal history of other mental and behavioral disorders: Secondary | ICD-10-CM | POA: Diagnosis not present

## 2023-03-24 DIAGNOSIS — I10 Essential (primary) hypertension: Secondary | ICD-10-CM

## 2023-03-24 DIAGNOSIS — Z9884 Bariatric surgery status: Secondary | ICD-10-CM

## 2023-03-24 DIAGNOSIS — M546 Pain in thoracic spine: Secondary | ICD-10-CM

## 2023-03-24 DIAGNOSIS — M47814 Spondylosis without myelopathy or radiculopathy, thoracic region: Secondary | ICD-10-CM

## 2023-03-24 DIAGNOSIS — K5909 Other constipation: Secondary | ICD-10-CM

## 2023-03-24 LAB — POC URINALSYSI DIPSTICK (AUTOMATED)
Bilirubin, UA: NEGATIVE
Glucose, UA: NEGATIVE
Ketones, UA: NEGATIVE
Leukocytes, UA: NEGATIVE
Nitrite, UA: NEGATIVE
Protein, UA: NEGATIVE
Spec Grav, UA: 1.015 (ref 1.010–1.025)
Urobilinogen, UA: NEGATIVE U/dL — AB
pH, UA: 6 (ref 5.0–8.0)

## 2023-03-25 LAB — MICROALBUMIN / CREATININE URINE RATIO
Creatinine, Urine: 109 mg/dL (ref 20–275)
Microalb Creat Ratio: 5 mg/g{creat} (ref <30)
Microalb, Ur: 0.5 mg/dL

## 2023-03-28 ENCOUNTER — Other Ambulatory Visit: Payer: Self-pay | Admitting: Internal Medicine

## 2023-03-28 NOTE — Telephone Encounter (Unsigned)
 Copied from CRM [id number]. Topic: Clinical - Medication Refill >> Mar 28, 2023 11:33 AM Franchot Heidelberg wrote: Most Recent Primary Care Visit:  Provider: Margaree Mackintosh  Department: [facility]  Visit Type: PHYSICAL 45  Date: 03/24/2023  Medication: linaclotide (LINZESS) 72 MCG capsule  Has the patient contacted their pharmacy? Yes (Agent: If no, request that the patient contact the pharmacy for the refill. If patient does not wish to contact the pharmacy document the reason why and proceed with request.) (Agent: If yes, when and what did the pharmacy advise?)  Is this the correct pharmacy for this prescription? Yes If no, delete pharmacy and type the correct one.  This is the patient's preferred pharmacy:   [id number] [place], [place] - [address] AT [facility] [address] [facility] [address] [place] [address] Phone: [phone] Fax: [phone]  Has the prescription been filled recently? Yes  Is the patient out of the medication? Yes  Has the patient been seen for an appointment in the last year OR does the patient have an upcoming appointment? Yes  Can we respond through MyChart? Yes  Agent: Please be advised that Rx refills may take up to 3 business days. We ask that you follow-up with your pharmacy.

## 2023-03-30 DIAGNOSIS — M542 Cervicalgia: Secondary | ICD-10-CM | POA: Diagnosis not present

## 2023-03-30 DIAGNOSIS — M546 Pain in thoracic spine: Secondary | ICD-10-CM | POA: Diagnosis not present

## 2023-03-30 DIAGNOSIS — R293 Abnormal posture: Secondary | ICD-10-CM | POA: Diagnosis not present

## 2023-03-30 MED ORDER — LINACLOTIDE 72 MCG PO CAPS: 72.0000 ug | ORAL_CAPSULE | Freq: Every day | ORAL | 1 refills | Status: DC

## 2023-04-02 ENCOUNTER — Encounter: Payer: Self-pay | Admitting: Internal Medicine

## 2023-04-02 NOTE — Patient Instructions (Addendum)
 It was a pleasure to see you today. Labs are stable. Please continue same medications and follow up in one year or as needed. Please continue Endocrinology follow up.

## 2023-04-04 DIAGNOSIS — R293 Abnormal posture: Secondary | ICD-10-CM | POA: Diagnosis not present

## 2023-04-04 DIAGNOSIS — M542 Cervicalgia: Secondary | ICD-10-CM | POA: Diagnosis not present

## 2023-04-04 DIAGNOSIS — M546 Pain in thoracic spine: Secondary | ICD-10-CM | POA: Diagnosis not present

## 2023-04-06 DIAGNOSIS — R293 Abnormal posture: Secondary | ICD-10-CM | POA: Diagnosis not present

## 2023-04-06 DIAGNOSIS — M542 Cervicalgia: Secondary | ICD-10-CM | POA: Diagnosis not present

## 2023-04-06 DIAGNOSIS — M546 Pain in thoracic spine: Secondary | ICD-10-CM | POA: Diagnosis not present

## 2023-04-25 DIAGNOSIS — Z1231 Encounter for screening mammogram for malignant neoplasm of breast: Secondary | ICD-10-CM | POA: Diagnosis not present

## 2023-04-25 DIAGNOSIS — Z01419 Encounter for gynecological examination (general) (routine) without abnormal findings: Secondary | ICD-10-CM | POA: Diagnosis not present

## 2023-05-08 ENCOUNTER — Ambulatory Visit: Admitting: Internal Medicine

## 2023-05-08 ENCOUNTER — Ambulatory Visit: Payer: Self-pay

## 2023-05-08 ENCOUNTER — Encounter: Payer: Self-pay | Admitting: Internal Medicine

## 2023-05-08 VITALS — BP 120/80 | HR 76 | Temp 98.3°F | Ht 65.4 in | Wt 128.0 lb

## 2023-05-08 DIAGNOSIS — R1011 Right upper quadrant pain: Secondary | ICD-10-CM | POA: Diagnosis not present

## 2023-05-08 DIAGNOSIS — J22 Unspecified acute lower respiratory infection: Secondary | ICD-10-CM

## 2023-05-08 DIAGNOSIS — R748 Abnormal levels of other serum enzymes: Secondary | ICD-10-CM

## 2023-05-08 DIAGNOSIS — I10 Essential (primary) hypertension: Secondary | ICD-10-CM

## 2023-05-08 DIAGNOSIS — E119 Type 2 diabetes mellitus without complications: Secondary | ICD-10-CM

## 2023-05-08 MED ORDER — HYDROCODONE-ACETAMINOPHEN 5-325 MG PO TABS: 1.0000 | ORAL_TABLET | Freq: Three times a day (TID) | ORAL | 0 refills | Status: AC | PRN

## 2023-05-08 MED ORDER — AMOXICILLIN-POT CLAVULANATE 500-125 MG PO TABS: 1.0000 | ORAL_TABLET | Freq: Three times a day (TID) | ORAL | 0 refills | Status: DC

## 2023-05-08 NOTE — Telephone Encounter (Signed)
 Chief Complaint: rib pain Symptoms: rib pain Frequency:  months Pertinent Negatives: Patient denies sob Disposition: [] ED /[] Urgent Care (no appt availability in office) / [x] Appointment(In office/virtual)/ []  [place] [facility]/ [] Home Care/ [] Refused Recommended Disposition /[] [place] [facility]/ []  Follow-up with PCP Additional Notes: pt states its been going on for month but experiencing pain under rib cage on right side. States notices it more after she eats or takes a deep breath. States first experienced it in Sept 2024  Copied from CRM #[id number]. Topic: Clinical - Red Word Triage >> May 08, 2023  9:52 AM Lizabeth Riggs wrote: Red Word that prompted transfer to Nurse Triage:  She is having pain in right side under rib cage towards middle. Pain level is a 8 out of 10. Reason for Disposition  [1] Patient says chest pain feels exactly the same as previously diagnosed "heartburn" AND [2] describes burning in chest AND [3] accompanying sour taste in mouth  Answer Assessment - Initial Assessment Questions 1. LOCATION: "Where does it hurt?"       Right rib cage 2. RADIATION: "Does the pain go anywhere else?" (e.g., into neck, jaw, arms, back)     no 3. ONSET: "When did the chest pain begin?" (Minutes, hours or days)      Months ago 4. PATTERN: "Does the pain come and go, or has it been constant since it started?"  "Does it get worse with exertion?"      Comes and goes 5. DURATION: "How long does it last" (e.g., seconds, minutes, hours)     hours 6. SEVERITY: "How bad is the pain?"  (e.g., Scale 1-10; mild, moderate, or severe)    - MILD (1-3): doesn't interfere with normal activities     - MODERATE (4-7): interferes with normal activities or awakens from sleep    - SEVERE (8-10): excruciating pain, unable to do any normal activities       severe 7. CARDIAC RISK FACTORS: "Do you have any history of heart problems or risk factors for heart disease?" (e.g., angina, prior heart attack;  diabetes, high blood pressure, high cholesterol, smoker, or strong family history of heart disease)     Htn and diabetes 8. PULMONARY RISK FACTORS: "Do you have any history of lung disease?"  (e.g., blood clots in lung, asthma, emphysema, birth control pills)     no 9. CAUSE: "What do you think is causing the chest pain?"     unknown 10. OTHER SYMPTOMS: "Do you have any other symptoms?" (e.g., dizziness, nausea, vomiting, sweating, fever, difficulty breathing, cough)       nausea  Protocols used: Chest Pain-A-AH

## 2023-05-08 NOTE — Progress Notes (Signed)
 Patient Care Team: Sylvan Evener, MD as PCP - General  Visit Date: 05/08/23  Subjective:   Chief Complaint  Patient presents with   Chest Pain   Fever   Nasal Congestion   Cough  Patient WU:JWJXB Melissa, Benson DOB:10/28/1960,62 y.o. JYN:[id number]   63 y.o. Female presents today for acute sick visit with Cough; Chest Pain; Nasal Congestion; Fever. Says that she likely contracted illness through interaction with a sick coworker and has had symptoms of: headache, fever (highest of 101.9)/chills, cough w/ expectoration of clear sputum, and nasal congestion w/ discolored mucus (clear, green, and bloody); tested herself for Covid-19 at-home, which was negative. UTD on flu vaccination.   Mentions she is still having RUQ abdominal pain that has been ongoing since being seen in the ED on 10/29/2022. While there it was noted that her Liver Functions were significantly elevated with AST being 224 and ALT 164 (on follow-up several days later were 234 and 216 respectively, decreasing on November 14th to AST of 39 and ALT of 63 - on 03/21/2023 ALT was 25 but due to hx of elevation was not so concerning). CT Abdomen/Pelvis showed  1. No specific abnormality is identified to explain the patient's right upper quadrant abdominal pain. 2. Gastric bypass. 3. Lower lumbar spondylosis and degenerative disc disease with left foraminal stenosis and suspected central narrowing of the thecal sac at the L4-5 level. 4. Mild mitral valve calcification. Hepatitis Panel non-reactive in ED; subsequent Hepatitis A, B, & C antibody on 11/07/2022 non-reactive. Past Medical History:  Diagnosis Date   Anxiety    Complication of anesthesia    Diabetes mellitus    GERD (gastroesophageal reflux disease)    Headache    no issues since stop caffeine   History of hiatal hernia    History of migraine headaches    History of urinary tract infection    last one 11/2014    History of vertigo    Hyperlipidemia     Hypertension    Neck pain, chronic    OSA (obstructive sleep apnea)    on CPAP   PONV (postoperative nausea and vomiting)    Psoriasis    Rosacea    Weak urinary stream     Allergies  Allergen Reactions   Morphine  And Codeine Nausea And Vomiting    Pt states also caused arm to turn black where medication was administered    Morphine  Other (See Comments)   Prednisone  Hives   Other Nausea And Vomiting    TYLOX    Family History  Problem Relation Age of Onset   Diabetes Mother    Heart disease Mother    Hypertension Mother    Lung disease Father    Heart disease Father    Hypertension Father    Diabetes Father    Hypertension Sister    Hyperlipidemia Brother    Hypertension Brother    Heart attack Maternal Grandmother    Heart disease Maternal Grandmother    Hypertension Maternal Grandfather    Diabetes Maternal Grandfather    Heart disease Maternal Grandfather    Social History   Social History Narrative   Lives with husband and one of her children in a one story home.  Has 2 children.     Works as a Corporate treasurer at [employer].  Education: college.   Review of Systems  Constitutional:  Positive for chills and fever (101.9 highest).  HENT:  Positive for congestion (discolored - clear, green, bloody).  Respiratory:  Positive for cough and sputum production (clear).   Genitourinary:        (+) RUQ Abd Pain  Neurological:  Positive for headaches.     Objective:  Vitals: BP 120/80   Pulse 76   Temp 98.3 F (36.8 C)   Ht 5' 5.4" (1.661 m)   Wt 128 lb (58.1 kg)   SpO2 98%   BMI 21.04 kg/m   Physical Exam Vitals and nursing note reviewed.  Constitutional:      General: She is not in acute distress.    Appearance: Normal appearance. She is not ill-appearing.  HENT:     Head: Normocephalic and atraumatic.     Right Ear: Tympanic membrane, ear canal and external ear normal.     Left Ear: Tympanic membrane, ear canal and external ear normal.     Mouth/Throat:      Mouth: Mucous membranes are moist.     Pharynx: Oropharynx is clear. No oropharyngeal exudate or posterior oropharyngeal erythema.  Pulmonary:     Effort: Pulmonary effort is normal.     Breath sounds: Normal breath sounds. No wheezing, rhonchi or rales.  Abdominal:     Tenderness: There is abdominal tenderness in the right upper quadrant.  Lymphadenopathy:     Cervical: No cervical adenopathy.  Skin:    General: Skin is warm and dry.  Neurological:     Mental Status: She is alert and oriented to person, place, and time. Mental status is at baseline.  Psychiatric:        Mood and Affect: Mood normal.        Behavior: Behavior normal.        Thought Content: Thought content normal.        Judgment: Judgment normal.    Results:  Studies Obtained And Personally Reviewed By Me:  CT ABDOMEN AND PELVIS WITH CONTRAST  10/29/2022  CLINICAL DATA:  Right upper quadrant abdominal pain. Nausea and dizziness.  TECHNIQUE: Multidetector CT imaging of the abdomen and pelvis was performed using the standard protocol following bolus administration of intravenous contrast.   RADIATION DOSE REDUCTION: This exam was performed according to the departmental dose-optimization program which includes automated exposure control, adjustment of the mA and/or kV according to patient size and/or use of iterative reconstruction technique.   CONTRAST:  75mL OMNIPAQUE  IOHEXOL  350 MG/ML SOLN   COMPARISON:  02/01/2017   FINDINGS: Please note: Some of the images of the brain associated with the CT angiogram brain performed at the same session were originally sent to this accession number. I have notified professional assistant personnel to have these transferred over to the CTA brain examination for interpretation with that exam, and to ensure that the radiologist interpreting the CT angiogram is aware of these images. The following interpretation is only for the CT of the abdomen/pelvis and does not  include interpretation of brain images.   Lower chest: Mild mitral valve calcification.   Hepatobiliary: Cholecystectomy.  Otherwise unremarkable.   Pancreas: Unremarkable   Spleen: Unremarkable   Adrenals/Urinary Tract: Unremarkable   Stomach/Bowel: Gastric bypass.  No findings of bowel obstruction.   Vascular/Lymphatic: Unremarkable   Reproductive: Uterus absent.  Adnexa unremarkable.   Other: No supplemental non-categorized findings.   Musculoskeletal: Lower lumbar spondylosis and degenerative disc disease with left foraminal stenosis and suspected central narrowing of the thecal sac at the L4-5 level.   IMPRESSION: 1. No specific abnormality is identified to explain the patient's right upper quadrant abdominal pain.  2.  Gastric bypass.  3. Lower lumbar spondylosis and degenerative disc disease with left foraminal stenosis and suspected central narrowing of the thecal sac at the L4-5 level.  4. Mild mitral valve calcification.   THORACIC SPINE - 3 VIEWS  11/15/2022  CLINICAL DATA:  mid back pain for weeks. Mid posterior back pain under the bra line, slightly to the right side and radiating to the right ribcage.   COMPARISON:  Thoracic spine radiographs 11/28/2017.   FINDINGS: Three views of the thoracic spine. Prior C6-7 ACDF and posterior spinal fusion with fractured C7 vertebral body screws, unchanged. Normal vertebral body heights and alignment. Interval progression of multilevel thoracic spondylosis. Facet arthropathy at T10-11 results in probable neural foraminal narrowing.   IMPRESSION: 1. Interval progression of multilevel thoracic spondylosis. Facet arthropathy at T10-11 results in probable neural foraminal narrowing.   2. Prior C6-7 ACDF and posterior spinal fusion with fractured C7 vertebral body screws, unchanged.   ULTRASOUND ABDOMEN LIMITED RIGHT UPPER QUADRANT  11/29/2022   CLINICAL DATA:  Elevated LFTs  COMPARISON:  CT abdomen pelvis  10/29/2022   FINDINGS: Gallbladder:   Surgically absent   Common bile duct:   Diameter: 8.6 mm   Liver:   No focal lesion identified. Within normal limits in parenchymal echogenicity. Portal vein is patent on color Doppler imaging with normal direction of blood flow towards the liver.   Other: None.   IMPRESSION: 1. No acute process.  2. Status post cholecystectomy.    MRI THORACIC SPINE WITHOUT CONTRAST  12/25/2022   CLINICAL DATA:  Back pain   COMPARISON:  X-ray 11/04/2022, CT 10/24/2018   FINDINGS: Alignment:  Physiologic.   Vertebrae: No fracture, evidence of discitis, or bone lesion.   Cord:  Normal signal and morphology.   Paraspinal and other soft tissues: Negative.   Disc levels:   Shallow noncompressive left paracentral disc protrusion at T7-T8. No additional disc protrusions. Canal is widely patent at all levels without stenosis. Mild-to-moderate degenerative facet arthropathy throughout the thoracic spine. Mild foraminal stenosis bilaterally at T1-T2 and on the right at T2-T3. No high-grade foraminal stenosis at any level.   IMPRESSION: 1. Mild-to-moderate degenerative facet arthropathy throughout the thoracic spine. No canal stenosis at any level.  2. Mild foraminal stenosis bilaterally at T1-T2 and on the right at T2-T3. No high-grade foraminal stenosis.  3. Shallow noncompressive left paracentral disc protrusion at T7-T8.   Component     Latest Ref Rng 10/29/2022 11/04/2022 11/17/2022 12/05/2022 03/21/2023  Total Protein     6.1 - 8.1 g/dL 6.9  6.4  6.3  6.3  6.2  Total Bilirubin     0.2 - 1.2 mg/dL 0.7  0.4  0.4  0.5  0.5  AST     10 - 35 U/L 224 (H)  234 (H)  39 (H)  36  35  ALT     6 - 29 U/L 164 (H)  216 (H)  62 (H)  39 (H)  25(H)   Hepatitis Panel non-reactive in ED; subsequent Hepatitis A, B, & C antibody on 11/07/2022 non-reactive.  Labs:     Component Value Date/Time   NA 141 03/21/2023 1222   K 4.2 03/21/2023 1222   CL 104  03/21/2023 1222   CO2 29 03/21/2023 1222   GLUCOSE 93 03/21/2023 1222   BUN 10 03/21/2023 1222   CREATININE 0.60 03/21/2023 1222   CALCIUM  9.1 03/21/2023 1222   PROT 6.2 03/21/2023 1222   ALBUMIN 4.0 12/05/2022 0820   AST 35  03/21/2023 1222   ALT 35 (H) 03/21/2023 1222   ALKPHOS 83 12/05/2022 0820   BILITOT 0.5 03/21/2023 1222   GFRNONAA >60 10/29/2022 0956   GFRNONAA 81 12/03/2019 0905   GFRAA 94 12/03/2019 0905    Lab Results  Component Value Date   WBC 3.8 03/21/2023   HGB 11.3 (L) 03/21/2023   HCT 34.2 (L) 03/21/2023   MCV 94.5 03/21/2023   PLT 233 03/21/2023   Lab Results  Component Value Date   CHOL 112 03/21/2023   HDL 62 03/21/2023   LDLCALC 36 03/21/2023   TRIG 56 03/21/2023   CHOLHDL 1.8 03/21/2023   Lab Results  Component Value Date   HGBA1C 5.3 03/21/2023    Lab Results  Component Value Date   TSH 2.25 03/21/2023    Assessment & Plan:   Meds ordered this encounter  Medications   HYDROcodone -acetaminophen  (NORCO/VICODIN) 5-325 MG tablet    Sig: Take 1 tablet by mouth every 8 (eight) hours as needed for moderate pain (pain score 4-6).    Dispense:  15 tablet    Refill:  0   amoxicillin -clavulanate (AUGMENTIN ) 500-125 MG tablet    Sig: Take 1 tablet by mouth 3 (three) times daily.    Dispense:  21 tablet    Refill:  0  Acute Lower Respiratory Infection: has had symptoms of: headache, fever (highest of 101.9)/chills, cough w/ expectoration of clear sputum, and nasal congestion w/ discolored mucus (clear, green, and bloody); tested herself for Covid-19 at-home, which was negative. UTD on flu vaccination. Sending in 500-125 mg Augmentin  - take 1 capsule (500 -125 mg total) by mouth 3 (three) times daily for 7 days   Persistent Right Upper Quadrant Abdominal Pain: ongoing since being seen in the ED on 10/29/2022. While there it was noted that her Liver Functions were significantly elevated with AST being 224 and ALT 164 (on follow-up several days later were  234 and 216 respectively, decreasing on November 14th to AST of 39 and ALT of 63 - on 03/21/2023 ALT was 25 but due to hx of elevation was not so concerning). CT Abdomen/Pelvis showed  1. No specific abnormality is identified to explain the patient's right upper quadrant abdominal pain. 2. Gastric bypass. 3. Lower lumbar spondylosis and degenerative disc disease with left foraminal stenosis and suspected central narrowing of the thecal sac at the L4-5 level. 4. Mild mitral valve calcification. Hepatitis Panel non-reactive in ED; subsequent Hepatitis A, B, & C antibody on 11/07/2022 non-reactive. Sending in Norco/Vicodin 5-325 mg to take as needed every 8 hours for moderate pain.  Addendum: lipase is elevated. May have pancreatitis with sxs of RUQ pain. Referring back to Dr. Tova Fresh, her Gastroenterologist.      Jaylene Metz Lagle,acting as a scribe for Sylvan Evener, MD.,have documented all relevant documentation on the behalf of Sylvan Evener, MD,as directed by  Sylvan Evener, MD while in the presence of Sylvan Evener, MD.   I, Sylvan Evener, MD, have reviewed all documentation for this visit. The documentation on 05/14/23 for the exam, diagnosis, procedures, and orders are all accurate and complete.

## 2023-05-09 ENCOUNTER — Other Ambulatory Visit

## 2023-05-09 DIAGNOSIS — E039 Hypothyroidism, unspecified: Secondary | ICD-10-CM

## 2023-05-09 DIAGNOSIS — R1011 Right upper quadrant pain: Secondary | ICD-10-CM | POA: Diagnosis not present

## 2023-05-10 LAB — CBC WITH DIFFERENTIAL/PLATELET
Absolute Lymphocytes: 862 {cells}/uL (ref 850–3900)
Absolute Monocytes: 326 {cells}/uL (ref 200–950)
Basophils Absolute: 22 {cells}/uL (ref 0–200)
Basophils Relative: 0.5 %
Eosinophils Absolute: 110 {cells}/uL (ref 15–500)
Eosinophils Relative: 2.5 %
HCT: 34.1 % — ABNORMAL LOW (ref 35.0–45.0)
Hemoglobin: 11.1 g/dL — ABNORMAL LOW (ref 11.7–15.5)
MCH: 30.2 pg (ref 27.0–33.0)
MCHC: 32.6 g/dL (ref 32.0–36.0)
MCV: 92.7 fL (ref 80.0–100.0)
MPV: 9.7 fL (ref 7.5–12.5)
Monocytes Relative: 7.4 %
Neutro Abs: 3080 {cells}/uL (ref 1500–7800)
Neutrophils Relative %: 70 %
Platelets: 262 10*3/uL (ref 140–400)
RBC: 3.68 10*6/uL — ABNORMAL LOW (ref 3.80–5.10)
RDW: 12.3 % (ref 11.0–15.0)
Total Lymphocyte: 19.6 %
WBC: 4.4 10*3/uL (ref 3.8–10.8)

## 2023-05-10 LAB — COMPLETE METABOLIC PANEL WITHOUT GFR
AG Ratio: 1.9 (calc) (ref 1.0–2.5)
ALT: 35 U/L — ABNORMAL HIGH (ref 6–29)
AST: 31 U/L (ref 10–35)
Albumin: 4.1 g/dL (ref 3.6–5.1)
Alkaline phosphatase (APISO): 82 U/L (ref 37–153)
BUN: 9 mg/dL (ref 7–25)
CO2: 28 mmol/L (ref 20–32)
Calcium: 9.2 mg/dL (ref 8.6–10.4)
Chloride: 104 mmol/L (ref 98–110)
Creat: 0.58 mg/dL (ref 0.50–1.05)
Globulin: 2.2 g/dL (ref 1.9–3.7)
Glucose, Bld: 111 mg/dL (ref 65–139)
Potassium: 4.5 mmol/L (ref 3.5–5.3)
Sodium: 139 mmol/L (ref 135–146)
Total Bilirubin: 0.3 mg/dL (ref 0.2–1.2)
Total Protein: 6.3 g/dL (ref 6.1–8.1)

## 2023-05-10 LAB — T4, FREE: Free T4: 1.1 ng/dL (ref 0.8–1.8)

## 2023-05-10 LAB — AMYLASE: Amylase: 69 U/L (ref 21–101)

## 2023-05-10 LAB — LIPASE: Lipase: 240 U/L — ABNORMAL HIGH (ref 7–60)

## 2023-05-10 LAB — TSH: TSH: 1.34 m[IU]/L (ref 0.40–4.50)

## 2023-05-11 ENCOUNTER — Other Ambulatory Visit (HOSPITAL_COMMUNITY): Payer: Self-pay | Admitting: Gastroenterology

## 2023-05-11 DIAGNOSIS — K5904 Chronic idiopathic constipation: Secondary | ICD-10-CM | POA: Diagnosis not present

## 2023-05-11 DIAGNOSIS — R748 Abnormal levels of other serum enzymes: Secondary | ICD-10-CM | POA: Diagnosis not present

## 2023-05-11 DIAGNOSIS — R7989 Other specified abnormal findings of blood chemistry: Secondary | ICD-10-CM

## 2023-05-11 DIAGNOSIS — R1011 Right upper quadrant pain: Secondary | ICD-10-CM | POA: Diagnosis not present

## 2023-05-11 DIAGNOSIS — K5730 Diverticulosis of large intestine without perforation or abscess without bleeding: Secondary | ICD-10-CM | POA: Diagnosis not present

## 2023-05-14 ENCOUNTER — Encounter: Payer: Self-pay | Admitting: Internal Medicine

## 2023-05-14 NOTE — Patient Instructions (Addendum)
 Patient says she would like to see Dr. Tova Fresh regarding evaluation for elevated serum lipase and right upper quadrant abdominal pain.  Symptoms are concerning for pancreatitis.  Patient should eat bland diet at the present time until she can see Dr. Tova Fresh.  Stay well-hydrated.  Dr. Tova Fresh is her regular Gastroenterologist but has seen [place] with Dr. Tova Fresh was out of the office.  She also has an acute lower respiratory infection today with fever.  Negative for COVID-19.  Sent in Augmentin  500/125 mg to take 3 times daily for 7 days.

## 2023-05-16 ENCOUNTER — Ambulatory Visit (HOSPITAL_COMMUNITY)
Admission: RE | Admit: 2023-05-16 | Discharge: 2023-05-16 | Disposition: A | Discharge status: Home / Self Care | Source: Ambulatory Visit | Attending: Gastroenterology | Admitting: Gastroenterology

## 2023-05-16 DIAGNOSIS — R7989 Other specified abnormal findings of blood chemistry: Secondary | ICD-10-CM | POA: Diagnosis not present

## 2023-05-16 DIAGNOSIS — Z9049 Acquired absence of other specified parts of digestive tract: Secondary | ICD-10-CM | POA: Diagnosis not present

## 2023-05-16 DIAGNOSIS — R1011 Right upper quadrant pain: Secondary | ICD-10-CM | POA: Insufficient documentation

## 2023-05-16 MED ORDER — GADOBUTROL 1 MMOL/ML IV SOLN
6.0000 mL | Freq: Once | INTRAVENOUS | Status: AC | PRN
  Administered 2023-05-16: 6 mL via INTRAVENOUS

## 2023-06-06 ENCOUNTER — Other Ambulatory Visit: Payer: Self-pay | Admitting: Internal Medicine

## 2023-06-22 ENCOUNTER — Telehealth: Payer: Self-pay | Admitting: Endocrinology

## 2023-06-22 NOTE — Telephone Encounter (Signed)
 Patient is calling to say that late last September 2024,early October                     2024 she went to the emergency department due to having pain in right side under rib cage.  An MRI was done.  Patient states that in December 20241 she had another MRI done due to back pain.  Patient states that in May 2025 she went to Dr. Tova Fresh (referred there by her PCP) for same pain.  Patient states that Dr. Tova Fresh ordered another MRI of her side.  Patient states that Dr. Tova Fresh concluded that the pain could possibly be caused by Mounjaro , so told patient to discontinue.  Patient is calling to say that she is no longer on any form of diabetic medicine and her levels at the highest have been 160 and at the lowest 88.  Patient wants to know what she should do about possibly being put on another medication for her diabetes.

## 2023-06-23 ENCOUNTER — Ambulatory Visit: Admitting: Internal Medicine

## 2023-06-23 ENCOUNTER — Other Ambulatory Visit: Payer: Self-pay | Admitting: Internal Medicine

## 2023-06-23 ENCOUNTER — Ambulatory Visit: Payer: Self-pay

## 2023-06-23 VITALS — BP 132/66 | HR 78 | Temp 98.0°F | Ht 64.0 in | Wt 142.8 lb

## 2023-06-23 DIAGNOSIS — R600 Localized edema: Secondary | ICD-10-CM | POA: Diagnosis not present

## 2023-06-23 DIAGNOSIS — Z8659 Personal history of other mental and behavioral disorders: Secondary | ICD-10-CM

## 2023-06-23 DIAGNOSIS — I10 Essential (primary) hypertension: Secondary | ICD-10-CM

## 2023-06-23 DIAGNOSIS — E119 Type 2 diabetes mellitus without complications: Secondary | ICD-10-CM

## 2023-06-23 DIAGNOSIS — J3489 Other specified disorders of nose and nasal sinuses: Secondary | ICD-10-CM | POA: Diagnosis not present

## 2023-06-23 DIAGNOSIS — S0031XD Abrasion of nose, subsequent encounter: Secondary | ICD-10-CM

## 2023-06-23 MED ORDER — MUPIROCIN 2 % EX OINT: 1.0000 | TOPICAL_OINTMENT | Freq: Two times a day (BID) | CUTANEOUS | 0 refills | Status: DC

## 2023-06-23 MED ORDER — FUROSEMIDE 20 MG PO TABS: ORAL_TABLET | ORAL | 0 refills | Status: AC

## 2023-06-23 NOTE — Progress Notes (Signed)
 Patient Care Team: Perri Ronal PARAS, MD as PCP - General  Visit Date: 06/23/23  Subjective:   Chief Complaint  Patient presents with   Foot Swelling    Patient noticed swelling about 3-4 weeks ago. Stopped taking mounjaro  and noticed the swelling. Not taking anything OTC to help. Not elevating legs or anything for swelling as well.   Patient PI:Melissa Benson, Melissa Benson DOB:03/24/1960,62 y.o. FMW:[id number]   63 y.o.Female presents today for acute sick visit with LE Edema. Patient has a past medical history of Hypertension treated with Ramipril  5 mg daily. Blood Pressure: normotensive today at 132/66. History of Diabetes Mellitus, type II not currently treated, previously on Mounjaro  5 mg. 03/21/2023 HgbA1c 5.3, 05/09/2023 Glucose 111. Followed by Dr. Mercie, Endocrinologist. Says that after stopping Mounjaro  5 weeks ago, which resolved her right-sided thoracic pain, but then noticed that her feet have been swelling. Says that swelling typically occurs in hot weather and when she is either standing or sitting with her legs down for extended periods, but hasn't tried elevating her legs for relief.   Also mentions that she has been having some irritation within her nostrils, which she has had before and was previously managed with Mupirocin  ointment twice daily, so requests a new prescription.  Past Medical History:  Diagnosis Date   Anxiety    Complication of anesthesia    Diabetes mellitus    GERD (gastroesophageal reflux disease)    Headache    no issues since stop caffeine   History of hiatal hernia    History of migraine headaches    History of urinary tract infection    last one 11/2014    History of vertigo    Hyperlipidemia    Hypertension    Neck pain, chronic    OSA (obstructive sleep apnea)    on CPAP   PONV (postoperative nausea and vomiting)    Psoriasis    Rosacea    Weak urinary stream     Allergies  Allergen Reactions   Morphine  And Codeine Nausea And Vomiting     Pt states also caused arm to turn black where medication was administered    Morphine  Other (See Comments)   Prednisone  Hives   Other Nausea And Vomiting    TYLOX    Family History  Problem Relation Age of Onset   Diabetes Mother    Heart disease Mother    Hypertension Mother    Lung disease Father    Heart disease Father    Hypertension Father    Diabetes Father    Hypertension Sister    Hyperlipidemia Brother    Hypertension Brother    Heart attack Maternal Grandmother    Heart disease Maternal Grandmother    Hypertension Maternal Grandfather    Diabetes Maternal Grandfather    Heart disease Maternal Grandfather    Social History   Social History Narrative   Lives with husband and one of her children in a one story home.  Has 2 children.     Works as a Corporate treasurer at [employer].  Education: college.   Review of Systems  Cardiovascular:  Positive for leg swelling (exacerbated with hot weather, keeping feet down for a long time).  Skin:        (+) Irritation, Nasal Interior Bilaterally  All other systems reviewed and are negative.    Objective:  Vitals: BP 132/66   Pulse 78   Temp 98 F (36.7 C) (Temporal)   Ht 5' 4 (1.626 m)  Wt 142 lb 12.8 oz (64.8 kg)   SpO2 98%   BMI 24.51 kg/m   Physical Exam Vitals and nursing note reviewed.  Constitutional:      General: She is not in acute distress.    Appearance: Normal appearance. She is not toxic-appearing.  HENT:     Head: Normocephalic and atraumatic.   Cardiovascular:     Rate and Rhythm: Normal rate and regular rhythm. No extrasystoles are present.    Pulses: Normal pulses.     Heart sounds: Normal heart sounds. No murmur heard.    No friction rub. No gallop.  Pulmonary:     Effort: Pulmonary effort is normal. No respiratory distress.     Breath sounds: Normal breath sounds. No wheezing or rales.   Musculoskeletal:     Right lower leg: 1+ Pitting Edema present.     Left lower leg: 1+ Pitting Edema  present.   Skin:    General: Skin is warm and dry.   Neurological:     Mental Status: She is alert and oriented to person, place, and time. Mental status is at baseline.   Psychiatric:        Mood and Affect: Mood normal.        Behavior: Behavior normal.        Thought Content: Thought content normal.        Judgment: Judgment normal.     Results:  Studies Obtained And Personally Reviewed By Me: Labs:     Component Value Date/Time   NA 139 05/09/2023 1026   K 4.5 05/09/2023 1026   CL 104 05/09/2023 1026   CO2 28 05/09/2023 1026   GLUCOSE 111 05/09/2023 1026   BUN 9 05/09/2023 1026   CREATININE 0.58 05/09/2023 1026   CALCIUM  9.2 05/09/2023 1026   PROT 6.3 05/09/2023 1026   ALBUMIN 4.0 12/05/2022 0820   AST 31 05/09/2023 1026   ALT 35 (H) 05/09/2023 1026   ALKPHOS 83 12/05/2022 0820   BILITOT 0.3 05/09/2023 1026   GFRNONAA >60 10/29/2022 0956   GFRNONAA 81 12/03/2019 0905   GFRAA 94 12/03/2019 0905    Lab Results  Component Value Date   WBC 4.4 05/09/2023   HGB 11.1 (L) 05/09/2023   HCT 34.1 (L) 05/09/2023   MCV 92.7 05/09/2023   PLT 262 05/09/2023   Lab Results  Component Value Date   CHOL 112 03/21/2023   HDL 62 03/21/2023   LDLCALC 36 03/21/2023   TRIG 56 03/21/2023   CHOLHDL 1.8 03/21/2023   Lab Results  Component Value Date   HGBA1C 5.3 03/21/2023    Lab Results  Component Value Date   TSH 1.34 05/09/2023    Assessment & Plan:   Meds ordered this encounter  Medications   mupirocin  ointment (BACTROBAN ) 2 %    Sig: Apply 1 Application topically 2 (two) times daily.    Dispense:  22 g    Refill:  0   furosemide  (LASIX ) 20 MG tablet    Sig: One tab by mouth daily as needed for dependent edema during hot weather    Dispense:  30 tablet    Refill:  0   Lower Extremity Edema, Bilateral after stopping Mounjaro  5 weeks ago she noticed that her feet have been swelling. Swelling typically occurs in hot weather and when she is either standing or  sitting with her legs down for extended periods, but hasn't tried elevating her legs for relief. Sending in Lasix  20 mg to take  as needed.   Irritation of Nose, Internal: mentions that she has been having some irritation within her nostrils, which she has had before and was previously managed with Mupirocin  ointment twice daily, so requests a new prescription - sending in. Likely has abrasion.  Hypertension treated with Ramipril  5 mg daily. Blood Pressure: normotensive today at 132/66.   Diabetes Mellitus, type II not currently treated, previously on Mounjaro  5 mg. 03/21/2023 HgbA1c 5.3, 05/09/2023 Glucose 111. Followed by Dr. Mercie, Endocrinologist.     I,Emily Lagle,acting as a scribe for Ronal JINNY Hailstone, MD.,have documented all relevant documentation on the behalf of Ronal JINNY Hailstone, MD,as directed by  Ronal JINNY Hailstone, MD while in the presence of Ronal JINNY Hailstone, MD.   I, Ronal JINNY Hailstone, MD, have reviewed all documentation for this visit. The documentation on 07/02/23 for the exam, diagnosis, procedures, and orders are all accurate and complete.

## 2023-06-23 NOTE — Telephone Encounter (Signed)
 FYI Only or Action Required?: FYI only for provider.  Patient was last seen in primary care on 05/08/2023 by Sylvan Evener, MD. Called Nurse Triage reporting Foot Swelling. Symptoms began several weeks ago. Interventions attempted: Nothing. Symptoms are: unchanged.  Triage Disposition: See Physician Within 24 Hours  Patient/caregiver understands and will follow disposition?: Yes           Copied from CRM [id number]. Topic: Clinical - Red Word Triage >> Jun 23, 2023  1:41 PM Carla L wrote: Red Word that prompted transfer to Nurse Triage: Pt legs are swelling, pt feels like she's retaining fluid Reason for Disposition  [1] MODERATE leg swelling (e.g., swelling extends up to knees) AND [2] new-onset or worsening  Answer Assessment - Initial Assessment Questions 1. ONSET: When did the swelling start? (e.g., minutes, hours, days)     Two to three weeks ago 2. LOCATION: What part of the leg is swollen?  Are both legs swollen or just one leg?     Bilateral feet 3. SEVERITY: How bad is the swelling? (e.g., localized; mild, moderate, severe)   - Localized: Small area of swelling localized to one leg.   - MILD pedal edema: Swelling limited to foot and ankle, pitting edema < 1/4 inch (6 mm) deep, rest and elevation eliminate most or all swelling.   - MODERATE edema: Swelling of lower leg to knee, pitting edema > 1/4 inch (6 mm) deep, rest and elevation only partially reduce swelling.   - SEVERE edema: Swelling extends above knee, facial or hand swelling present.      moderate 4. REDNESS: Does the swelling look red or infected?     no 5. PAIN: Is the swelling painful to touch? If Yes, ask: How painful is it?   (Scale 1-10; mild, moderate or severe)     denies 6. FEVER: Do you have a fever? If Yes, ask: What is it, how was it measured, and when did it start?      no 7. CAUSE: What do you think is causing the leg swelling?     no 8. MEDICAL HISTORY: Do you have a history  of blood clots (e.g., DVT), cancer, heart failure, kidney disease, or liver failure?     no 9. RECURRENT SYMPTOM: Have you had leg swelling before? If Yes, ask: When was the last time? What happened that time?     no 10. OTHER SYMPTOMS: Do you have any other symptoms? (e.g., chest pain, difficulty breathing)       no 11. PREGNANCY: Is there any chance you are pregnant? When was your last menstrual period?       na  Protocols used: Leg Swelling and Edema-A-AH

## 2023-06-23 NOTE — Telephone Encounter (Signed)
 She has controlled diabetes mellitus.  She can continue to monitor blood sugar at home.  Reported blood sugar 160, 88 are reasonable.  We can monitor without another diabetic medication.  I will review blood sugar data and also recheck hemoglobin A1c in the follow-up visit in July/August, and make a plan as needed.

## 2023-06-30 ENCOUNTER — Other Ambulatory Visit: Payer: Self-pay

## 2023-06-30 DIAGNOSIS — E119 Type 2 diabetes mellitus without complications: Secondary | ICD-10-CM

## 2023-06-30 MED ORDER — CONTOUR NEXT TEST VI STRP: ORAL_STRIP | 3 refills | Status: AC

## 2023-07-02 ENCOUNTER — Encounter: Payer: Self-pay | Admitting: Internal Medicine

## 2023-07-02 NOTE — Patient Instructions (Addendum)
 Your physical exam is due March 2026.  Please call for an appointment.  We have prescribed Lasix  20 mg daily to take as needed for dependent edema associated with hot weather and dependent position.  Irritation of nose will be treated with mupirocin  ointment and new prescription has been sent and as you likely have an abrasion in that area.  Continue ramipril .  It is noted she currently is not on Mounjaro  and continues to be followed by Dr. Mercie, endocrinologist.

## 2023-07-10 DIAGNOSIS — H04123 Dry eye syndrome of bilateral lacrimal glands: Secondary | ICD-10-CM | POA: Diagnosis not present

## 2023-07-10 DIAGNOSIS — Z961 Presence of intraocular lens: Secondary | ICD-10-CM | POA: Diagnosis not present

## 2023-07-10 DIAGNOSIS — H40013 Open angle with borderline findings, low risk, bilateral: Secondary | ICD-10-CM | POA: Diagnosis not present

## 2023-07-10 LAB — HM DIABETES EYE EXAM

## 2023-07-16 ENCOUNTER — Other Ambulatory Visit: Payer: Self-pay | Admitting: Internal Medicine

## 2023-07-17 ENCOUNTER — Other Ambulatory Visit: Payer: Self-pay | Admitting: Internal Medicine

## 2023-07-24 ENCOUNTER — Other Ambulatory Visit: Payer: Self-pay | Admitting: Internal Medicine

## 2023-07-31 ENCOUNTER — Other Ambulatory Visit

## 2023-07-31 DIAGNOSIS — E119 Type 2 diabetes mellitus without complications: Secondary | ICD-10-CM | POA: Diagnosis not present

## 2023-07-31 DIAGNOSIS — E559 Vitamin D deficiency, unspecified: Secondary | ICD-10-CM | POA: Diagnosis not present

## 2023-07-31 DIAGNOSIS — M85859 Other specified disorders of bone density and structure, unspecified thigh: Secondary | ICD-10-CM | POA: Diagnosis not present

## 2023-08-01 ENCOUNTER — Other Ambulatory Visit: Payer: Self-pay

## 2023-08-01 ENCOUNTER — Emergency Department (HOSPITAL_BASED_OUTPATIENT_CLINIC_OR_DEPARTMENT_OTHER)
Admission: EM | Admit: 2023-08-01 | Discharge: 2023-08-01 | Disposition: A | Discharge status: Home / Self Care | Source: Ambulatory Visit | Attending: Emergency Medicine | Admitting: Emergency Medicine

## 2023-08-01 ENCOUNTER — Encounter (HOSPITAL_BASED_OUTPATIENT_CLINIC_OR_DEPARTMENT_OTHER): Payer: Self-pay

## 2023-08-01 ENCOUNTER — Ambulatory Visit: Payer: Self-pay

## 2023-08-01 ENCOUNTER — Ambulatory Visit: Payer: Self-pay | Admitting: Endocrinology

## 2023-08-01 ENCOUNTER — Emergency Department (HOSPITAL_BASED_OUTPATIENT_CLINIC_OR_DEPARTMENT_OTHER)

## 2023-08-01 DIAGNOSIS — D649 Anemia, unspecified: Secondary | ICD-10-CM | POA: Diagnosis not present

## 2023-08-01 DIAGNOSIS — R42 Dizziness and giddiness: Secondary | ICD-10-CM | POA: Diagnosis not present

## 2023-08-01 DIAGNOSIS — I10 Essential (primary) hypertension: Secondary | ICD-10-CM | POA: Diagnosis not present

## 2023-08-01 DIAGNOSIS — Z79899 Other long term (current) drug therapy: Secondary | ICD-10-CM | POA: Diagnosis not present

## 2023-08-01 DIAGNOSIS — R519 Headache, unspecified: Secondary | ICD-10-CM | POA: Diagnosis not present

## 2023-08-01 DIAGNOSIS — E119 Type 2 diabetes mellitus without complications: Secondary | ICD-10-CM | POA: Diagnosis not present

## 2023-08-01 LAB — CBC
HCT: 35.6 % — ABNORMAL LOW (ref 36.0–46.0)
Hemoglobin: 11.5 g/dL — ABNORMAL LOW (ref 12.0–15.0)
MCH: 31.1 pg (ref 26.0–34.0)
MCHC: 32.3 g/dL (ref 30.0–36.0)
MCV: 96.2 fL (ref 80.0–100.0)
Platelets: 218 K/uL (ref 150–400)
RBC: 3.7 MIL/uL — ABNORMAL LOW (ref 3.87–5.11)
RDW: 13.2 % (ref 11.5–15.5)
WBC: 4.8 K/uL (ref 4.0–10.5)
nRBC: 0 % (ref 0.0–0.2)

## 2023-08-01 LAB — BASIC METABOLIC PANEL WITH GFR
BUN: 9 mg/dL (ref 7–25)
CO2: 32 mmol/L (ref 20–32)
Calcium: 9.1 mg/dL (ref 8.6–10.4)
Chloride: 104 mmol/L (ref 98–110)
Creat: 0.68 mg/dL (ref 0.50–1.05)
Glucose, Bld: 116 mg/dL — ABNORMAL HIGH (ref 65–99)
Potassium: 3.4 mmol/L — ABNORMAL LOW (ref 3.5–5.3)
Sodium: 142 mmol/L (ref 135–146)
eGFR: 98 mL/min/1.73m2 (ref >=60)

## 2023-08-01 LAB — MICROALBUMIN / CREATININE URINE RATIO
Creatinine, Urine: 74 mg/dL (ref 20–275)
Microalb, Ur: <0.2 mg/dL

## 2023-08-01 LAB — COMPREHENSIVE METABOLIC PANEL WITH GFR
ALT: 19 U/L (ref 0–44)
AST: 27 U/L (ref 15–41)
Albumin: 4.3 g/dL (ref 3.5–5.0)
Alkaline Phosphatase: 87 U/L (ref 38–126)
Anion gap: 10 (ref 5–15)
BUN: 9 mg/dL (ref 8–23)
CO2: 27 mmol/L (ref 22–32)
Calcium: 9.6 mg/dL (ref 8.9–10.3)
Chloride: 106 mmol/L (ref 98–111)
Creatinine, Ser: 0.69 mg/dL (ref 0.44–1.00)
GFR, Estimated: >60 mL/min (ref >60)
Glucose, Bld: 154 mg/dL — ABNORMAL HIGH (ref 70–99)
Potassium: 3.6 mmol/L (ref 3.5–5.1)
Sodium: 143 mmol/L (ref 135–145)
Total Bilirubin: 0.3 mg/dL (ref 0.0–1.2)
Total Protein: 6.9 g/dL (ref 6.5–8.1)

## 2023-08-01 LAB — HEMOGLOBIN A1C
Hgb A1c MFr Bld: 5.9 % — ABNORMAL HIGH (ref <5.7)
Mean Plasma Glucose: 123 mg/dL
eAG (mmol/L): 6.8 mmol/L

## 2023-08-01 LAB — CBG MONITORING, ED: Glucose-Capillary: 126 mg/dL — ABNORMAL HIGH (ref 70–99)

## 2023-08-01 LAB — VITAMIN D 25 HYDROXY (VIT D DEFICIENCY, FRACTURES): Vit D, 25-Hydroxy: 72 ng/mL (ref 30–100)

## 2023-08-01 MED ORDER — MECLIZINE HCL 25 MG PO TABS: 25.0000 mg | ORAL_TABLET | Freq: Three times a day (TID) | ORAL | 0 refills | Status: AC | PRN

## 2023-08-01 MED ORDER — MECLIZINE HCL 25 MG PO TABS
25.0000 mg | ORAL_TABLET | Freq: Once | ORAL | Status: AC
  Administered 2023-08-01: 25 mg via ORAL
  Filled 2023-08-01: qty 1

## 2023-08-01 MED ORDER — SODIUM CHLORIDE 0.9 % IV BOLUS
500.0000 mL | Freq: Once | INTRAVENOUS | Status: AC
  Administered 2023-08-01: 500 mL via INTRAVENOUS

## 2023-08-01 MED ORDER — ACETAMINOPHEN 325 MG PO TABS
650.0000 mg | ORAL_TABLET | Freq: Once | ORAL | Status: AC
  Administered 2023-08-01: 650 mg via ORAL
  Filled 2023-08-01: qty 2

## 2023-08-01 NOTE — ED Provider Notes (Signed)
 [facility] AT [facility] Provider Note   CSN: [id number] Arrival date & time: 08/01/23  1109     Patient presents with: Dizziness   Melissa Benson is a 63 y.o. female past medical history significant for hypertension, hyperlipidemia, obesity, status post gastric bypass, diabetes who presents concern for dizziness today.  She reports that she was at the dentist this morning, she felt some drainage to the back of her throat.  Then she went to work, she felt lightheaded, had a near syncopal episode.  Does have a history of vertigo, reports some vertiginous symptoms.  Denies any head injury, does endorse some mild left-sided headache.  Denies any vision changes, numbness, tingling.  She has a Lasix  to use as needed for dependent edema in summertime and reports that she took 1 yesterday.  She does endorse that she has been urinating very frequently.    Dizziness      Prior to Admission medications   Medication Sig Start Date End Date Taking? Authorizing Provider  meclizine  (ANTIVERT ) 25 MG tablet Take 1 tablet (25 mg total) by mouth 3 (three) times daily as needed for dizziness. 08/01/23  Yes Jora Galluzzo H, PA-C  ALPRAZolam  (XANAX ) 0.5 MG tablet TAKE 1 TABLET(0.5 MG) BY MOUTH TWICE DAILY AS NEEDED FOR ANXIETY 07/18/23   Perri Ronal PARAS, MD  amoxicillin -clavulanate (AUGMENTIN ) 500-125 MG tablet Take 1 tablet by mouth 3 (three) times daily. Patient not taking: Reported on 06/23/2023 05/08/23   Perri Ronal PARAS, MD  b complex vitamins capsule Take 1 capsule by mouth daily.    [provider]  Biotin 1 MG CAPS Take 1 mg by mouth as needed.    [provider]  cyclobenzaprine  (FLEXERIL ) 10 MG tablet TAKE 1 TABLET(10 MG) BY MOUTH AT BEDTIME 07/24/23   Perri Ronal PARAS, MD  fluticasone  (FLONASE ) 50 MCG/ACT nasal spray Place 2 sprays into both nostrils daily. 11/13/20   Van Knee, MD  furosemide  (LASIX ) 20 MG tablet One tab by mouth daily as  needed for dependent edema during hot weather 06/23/23   Perri Ronal PARAS, MD  glucose blood (CONTOUR NEXT TEST) test strip Use as instructedUSE AS DIRECTED TO TEST BLOOD GLUCOSE ONCE DAILY 06/30/23   Thapa, [place], MD  HYDROcodone -acetaminophen  (NORCO/VICODIN) 5-325 MG tablet Take 1 tablet by mouth every 8 (eight) hours as needed for moderate pain (pain score 4-6). Patient not taking: Reported on 06/23/2023 05/08/23   Perri Ronal PARAS, MD  linaclotide  (LINZESS ) 72 MCG capsule Take 1 capsule (72 mcg total) by mouth daily. 03/30/23   Perri Ronal PARAS, MD  multivitamin (VIT W/EXTRA C) CHEW chewable tablet Chew 1 tablet by mouth daily.    [provider]  mupirocin  ointment (BACTROBAN ) 2 % Apply 1 Application topically 2 (two) times daily. 06/23/23   Perri Ronal PARAS, MD  nitrofurantoin , macrocrystal-monohydrate, (MACROBID ) 100 MG capsule Take 1 capsule (100 mg total) by mouth 2 (two) times daily. Patient not taking: Reported on 06/23/2023 12/16/22   Perri Ronal PARAS, MD  promethazine  (PHENERGAN ) 25 MG tablet TAKE 1 TABLET BY MOUTH EVERY 8 HOURS AS NEEDED FOR NAUSEA 07/17/23   Perri Ronal PARAS, MD  ramipril  (ALTACE ) 5 MG capsule TAKE 1 CAPSULE(5 MG) BY MOUTH DAILY 06/07/23   Perri Ronal PARAS, MD  rosuvastatin  (CRESTOR ) 5 MG tablet TAKE 1 TABLET(5 MG) BY MOUTH DAILY 12/30/22   Perri Ronal PARAS, MD  tirzepatide  (MOUNJARO ) 5 MG/0.5ML Pen Inject 5 mg into the skin once a week. Patient not  taking: Reported on 06/23/2023 03/06/23   Thapa, [place], MD  valACYclovir  (VALTREX ) 500 MG tablet TAKE 1 TABLET(500 MG) BY MOUTH TWICE DAILY AS NEEDED FOR COLD SORES OR OUTBREAK 06/07/23   Perri Ronal PARAS, MD    Allergies: Morphine  and codeine, Morphine , Prednisone , and Other    Review of Systems  Neurological:  Positive for dizziness.  All other systems reviewed and are negative.   Updated Vital Signs BP (!) 129/100   Pulse (!) 58   Temp 98.2 F (36.8 C) (Oral)   Resp 15   SpO2 100%   Physical Exam Vitals and nursing note  reviewed.  Constitutional:      General: She is not in acute distress.    Appearance: Normal appearance.  HENT:     Head: Normocephalic and atraumatic.  Eyes:     General:        Right eye: No discharge.        Left eye: No discharge.  Cardiovascular:     Rate and Rhythm: Normal rate and regular rhythm.     Heart sounds: No murmur heard.    No friction rub. No gallop.  Pulmonary:     Effort: Pulmonary effort is normal.     Breath sounds: Normal breath sounds.  Abdominal:     General: Bowel sounds are normal.     Palpations: Abdomen is soft.  Skin:    General: Skin is warm and dry.     Capillary Refill: Capillary refill takes less than 2 seconds.  Neurological:     Mental Status: She is alert and oriented to person, place, and time.     Comments: Cranial nerves II through XII grossly intact.  Intact finger-nose, intact heel-to-shin.  Romberg negative, gait normal.  Alert and oriented x3.  Moves all 4 limbs spontaneously, normal coordination.  No pronator drift.  Intact strength 5 out of 5 bilateral upper and lower extremities.  Dixhallpike does trigger some vertigo like symptoms on left. No nystagmus.  Psychiatric:        Mood and Affect: Mood normal.        Behavior: Behavior normal.     (all labs ordered are listed, but only abnormal results are displayed) Labs Reviewed  COMPREHENSIVE METABOLIC PANEL WITH GFR - Abnormal; Notable for the following components:      Result Value   Glucose, Bld 154 (*)    All other components within normal limits  CBC - Abnormal; Notable for the following components:   RBC 3.70 (*)    Hemoglobin 11.5 (*)    HCT 35.6 (*)    All other components within normal limits  CBG MONITORING, ED - Abnormal; Notable for the following components:   Glucose-Capillary 126 (*)    All other components within normal limits  URINALYSIS, ROUTINE W REFLEX MICROSCOPIC    EKG: None  Radiology: CT Head Wo Contrast Result Date: 08/01/2023 CLINICAL DATA:   63 year old female with headache, dizziness status post dental care this morning. EXAM: CT HEAD WITHOUT CONTRAST TECHNIQUE: Contiguous axial images were obtained from the base of the skull through the vertex without intravenous contrast. RADIATION DOSE REDUCTION: This exam was performed according to the departmental dose-optimization program which includes automated exposure control, adjustment of the mA and/or kV according to patient size and/or use of iterative reconstruction technique. COMPARISON:  Head CT, CTA 10/29/2022. FINDINGS: Brain: Cerebral volume remains normal for age. No midline shift, ventriculomegaly, mass effect, evidence of mass lesion, intracranial hemorrhage or evidence of cortically  based acute infarction. Gray-white matter differentiation is within normal limits throughout the brain. Vascular: No suspicious intracranial vascular hyperdensity. Mild Calcified atherosclerosis at the skull base. Skull: No acute osseous abnormality identified. Stable hyperostosis of the calvarium. Sinuses/Orbits: Tympanic cavities, Visualized paranasal sinuses and mastoids are clear. Other: No acute orbit or scalp soft tissue finding. IMPRESSION: Stable and normal for age noncontrast Head CT. Electronically Signed   By: VEAR Hurst M.D.   On: 08/01/2023 13:22     Procedures   Medications Ordered in the ED  sodium chloride  0.9 % bolus 500 mL (0 mLs Intravenous Stopped 08/01/23 1409)  acetaminophen  (TYLENOL ) tablet 650 mg (650 mg Oral Given 08/01/23 1320)  meclizine  (ANTIVERT ) tablet 25 mg (25 mg Oral Given 08/01/23 1320)                                    Medical Decision Making Amount and/or Complexity of Data Reviewed Labs: ordered.   This patient is a 62 y.o. female  who presents to the ED for concern of dizziness, lightheadedness, near syncope.   Differential diagnoses prior to evaluation: The emergent differential diagnosis includes, but is not limited to,  BPPV, vestibular migraine, head trauma,  AVM, intracranial tumor, multiple sclerosis, drug-related, CVA, orthostatic hypotension, sepsis, hypoglycemia, electrolyte disturbance, anemia, anxiety . This is not an exhaustive differential.   Past Medical History / Co-morbidities / Social History: hypertension, hyperlipidemia, obesity, status post gastric bypass, diabetes   Physical Exam: Physical exam performed. The pertinent findings include: No focal neurologic deficits on exam, I was able to trigger vertiginous symptoms with Dix-Hallpike to the left, no nystagmus noted however.  Lab Tests/Imaging studies: I personally interpreted labs/imaging and the pertinent results include: CBC with mild anemia, hemoglobin 11.5, CMP unremarkable, normal CBG.  Patient did not provide a urine sample, but is not having any urinary symptoms, I think okay to forego at this time.  CT head without contrast shows no evidence of acute intracranial abnormality. I agree with the radiologist interpretation.  Cardiac monitoring: EKG obtained and interpreted by myself and attending physician which shows: NSR   Medications: I ordered medication including small fluid bolus, Tylenol , meclizine .  I have reviewed the patients home medicines and have made adjustments as needed.  On reevaluation patient was able to ambulate without difficulty, no ongoing dizziness, lightheadedness.   Disposition: After consideration of the diagnostic results and the patients response to treatment, I feel that patient with some history of peripheral vertigo, I suspect dental appointment triggered her vertigo secondary to being leaned back, having Dix-Hallpike maneuver, I also suspect she may have had some small fluid deficit secondary to taking Lasix  at home.  Encouraged fluids, meclizine  as needed, follow-up with primary care doctor as needed if symptoms persist.   emergency department workup does not suggest an emergent condition requiring admission or immediate intervention beyond what  has been performed at this time. The plan is: as above. The patient is safe for discharge and has been instructed to return immediately for worsening symptoms, change in symptoms or any other concerns.   Final diagnoses:  Vertigo    ED Discharge Orders          Ordered    meclizine  (ANTIVERT ) 25 MG tablet  3 times daily PRN        08/01/23 1433               Hiro Vipond H,  PA-C 08/01/23 1455    Mannie Pac T, DO 08/02/23 2310

## 2023-08-01 NOTE — Telephone Encounter (Signed)
 FYI Only or Action Required?: FYI only for provider.  Patient was last seen in primary care on 06/23/2023 by Perri Ronal PARAS, MD.  Called Nurse Triage reporting Dizziness.  Symptoms began today.  Interventions attempted: Rest, hydration, or home remedies.  Symptoms are: gradually worsening.  Triage Disposition: Go to ED Now (or PCP Triage)  Patient/caregiver understands and will follow disposition?: Yes            Copied from CRM [id number]. Topic: Clinical - Red Word Triage >> Aug 01, 2023  9:14 AM Burnard DEL wrote: Red Word that prompted transfer to Nurse Triage: dizziness/swollen ankles Reason for Disposition  SEVERE dizziness (e.g., unable to stand, requires support to walk, feels like passing out now)  Answer Assessment - Initial Assessment Questions 1. DESCRIPTION: Describe your dizziness.     Woozy, walking sideways  4. SEVERITY: How bad is it?  Do you feel like you are going to faint? Can you stand and walk?     Pt doesn't feel like she's going to pass out 5. ONSET:  When did the dizziness begin?     This morning at dentist 6. AGGRAVATING FACTORS: Does anything make it worse? (e.g., standing, change in head position)     Standing, ambulation, change in head position 7. HEART RATE: Can you tell me your heart rate? How many beats in 15 seconds?  (Note: Not all patients can do this.)       None 8. CAUSE: What do you think is causing the dizziness? (e.g., decreased fluids or food, diarrhea, emotional distress, heat exposure, new medicine, sudden standing, vomiting; unknown)     Unknown 9. RECURRENT SYMPTOM: Have you had dizziness before? If Yes, ask: When was the last time? What happened that time?     No 10. OTHER SYMPTOMS: Do you have any other symptoms? (e.g., fever, chest pain, vomiting, diarrhea, bleeding)       None  Protocols used: Dizziness - Lightheadedness-A-AH

## 2023-08-01 NOTE — ED Notes (Signed)
 Reviewed AVS/discharge instruction with patient. Time allotted for and all questions answered. Patient is agreeable for d/c and escorted to ed exit by staff.

## 2023-08-01 NOTE — ED Triage Notes (Addendum)
 Pt c/o dizziness onset today; States she went to dentist this AM for cleaning, felt like I had drainage & then when I stood up I felt like I was drunk, then when I got to work they wouldn't let me stay. I'm not sure if it's because I took a fluid pill yesterday morning r/t BLE swelling. States dizziness is worse when moving head.  Pt advises that head feels really heavy like I have a HA, sort of feels like I have anxiety in my chest.

## 2023-08-01 NOTE — Discharge Instructions (Addendum)
 I would hold off on taking your home Lasix  while you are feeling dizzy, I prescribed a medication called meclizine  that you can take as needed if you do have some return of dizziness symptoms.  Otherwise please follow-up with your primary care doctor.  You can refer to the information on your discharge instructions, if you have persistent vertigo that does not go away you can perform something called the Epley maneuver -- I attached a sheet on your discharge with some instructions on trying at home.

## 2023-08-04 ENCOUNTER — Ambulatory Visit: Admitting: Endocrinology

## 2023-08-04 ENCOUNTER — Encounter: Payer: Self-pay | Admitting: Endocrinology

## 2023-08-04 VITALS — BP 138/86 | HR 73 | Resp 20 | Ht 64.0 in | Wt 154.8 lb

## 2023-08-04 DIAGNOSIS — E559 Vitamin D deficiency, unspecified: Secondary | ICD-10-CM | POA: Diagnosis not present

## 2023-08-04 DIAGNOSIS — E119 Type 2 diabetes mellitus without complications: Secondary | ICD-10-CM

## 2023-08-04 DIAGNOSIS — M85859 Other specified disorders of bone density and structure, unspecified thigh: Secondary | ICD-10-CM | POA: Diagnosis not present

## 2023-08-04 NOTE — Progress Notes (Signed)
 Outpatient Endocrinology Note Melissa Sylina Henion, MD   Patient's Name: Melissa Benson    DOB: 11/08/1960    MRN: [id number]                                                    REASON OF VISIT: Follow up for type 2 diabetes mellitus  PCP: Perri Ronal PARAS, MD  HISTORY OF PRESENT ILLNESS:   Melissa Benson is a 63 y.o. old female with past medical history listed below, is here for follow up for type 2 diabetes mellitus.   Pertinent Diabetes History: Patient was previously seen by Dr. Von and was last time seen in August 2024.  Patient was diagnosed with type 2 diabetes mellitus in 2009.  Patient was treated with various oral hypoglycemic drugs and was also relatively on higher dose of basal insulin  in the past.  Restart of Victoza  she was able to gradually cut down on insulin  requirement and also lost significant amount of weight.  She had gastric bypass surgery in 2017 and was able to stop insulin .  She had tried metformin  alone in 2018 for about a year and was ultimately stopped.  Chronic Diabetes Complications : Retinopathy: no. Last ophthalmology exam was done on annually 07/2023, following with ophthalmology regularly.  Nephropathy: no, on ACE/ARB /ramipril . Peripheral neuropathy: Mild tingling of the toes.  On gabapentin  primarily for back pain and the leg pain. Coronary artery disease: no Stroke: no  Relevant comorbidities and cardiovascular risk factors: Obesity: no Body mass index is 26.57 kg/m.  Status post gastric bypass surgery in 2017. Hypertension: Yes  Hyperlipidemia : Yes, on statin   Current / Home Diabetic regimen includes:  None  Prior diabetic medications: Various oral antidiabetic medication and basal insulin  in the past.  Nausea and diarrhea with metformin .  Nausea from glipizide . Stopped taking Mounjaro  in May 2025 with gastroenterology advised due to right upper quadrant abdominal pain.  Glycemic data:   Ascensia Contour next one glucometer, download  from July 18 to August 04, 2023, average blood sugar 123.  Lowest blood sugar 105, highest blood sugar 168.  She has been takingtwice a day however fasting blood sugar 116, 113, 122, 111, 120, 120.  Blood sugar in the afternoon and in the evening 136, 136, 105, 120, 135.  Hypoglycemia: Patient has no hypoglycemic episodes. Patient has hypoglycemia awareness.  Factors modifying glucose control: 1.  Diabetic diet assessment: 3 meals a day.  2.  Staying active or exercising: Walking.  3.  Medication compliance: compliant all of the time.  # Osteopenia : Last DEXA scan in April 2023 T score is -1.3 at the hip and previously in 2021 was - 1.4, FRAX score: 10 year major osteoporotic risk: 13.4%. 10 year hip fracture risk: 1.0%.   Takes Vitamin D    Interval history  Glucometer data as reviewed above.  Hemoglobin A1c 5.9%.  Patient stopped taking Mounjaro  from mid of May as advised by gastroenterology due to right abdominal upper quadrant pain.  She has been monitoring blood sugar without diabetic medication, mostly acceptable blood sugar.  Recently she is having dizziness requiring ER visit.  She has been slowly gaining weight probably related to not being on Mounjaro .  No other complaints today.  She has mild anemia, she takes iron supplement.  Discussed about follow-up with gastroenterology  for evaluation of anemia.  Recent lab results reviewed, acceptable renal function and electrolytes.  Vitamin D  acceptable.  REVIEW OF SYSTEMS As per history of present illness.   PAST MEDICAL HISTORY: Past Medical History:  Diagnosis Date   Anxiety    Complication of anesthesia    Diabetes mellitus    GERD (gastroesophageal reflux disease)    Headache    no issues since stop caffeine   History of hiatal hernia    History of migraine headaches    History of urinary tract infection    last one 11/2014    History of vertigo    Hyperlipidemia    Hypertension    Neck pain, chronic    OSA  (obstructive sleep apnea)    on CPAP   PONV (postoperative nausea and vomiting)    Psoriasis    Rosacea    Weak urinary stream     PAST SURGICAL HISTORY: Past Surgical History:  Procedure Laterality Date   2D ECHOCARDIOGRAM  07/20/2011   EF >55%, normal   ABDOMINAL HYSTERECTOMY     APPENDECTOMY     CARDIOVASCULAR STRESS TEST  07/20/2011   Normal myocardial perfusion study, EKG negative for ischemia, no ECG changes   CHOLECYSTECTOMY     colonscopy      polyps removed    DIAGNOSTIC LAPAROSCOPY     DILATION AND CURETTAGE OF UTERUS     LAPAROSCOPIC ROUX-EN-Y GASTRIC BYPASS WITH HIATAL HERNIA REPAIR N/A 01/12/2015   Procedure: LAPAROSCOPIC ROUX-EN-Y GASTRIC BYPASS WITH LYSIS OF ADHESIONS ;  Surgeon: Donnice Lunger, MD;  Location: [facility];  Service: General;  Laterality: N/A;   LAPAROSCOPY  02/01/2017   Laparoscopic lysis of adhesions   LAPAROSCOPY N/A 02/01/2017   Procedure: LAPAROSCOPY Lysis of adhesions;  Surgeon: Signe Mitzie LABOR, MD;  Location: [facility];  Service: General;  Laterality: N/A;   LAPAROTOMY N/A 02/01/2017   Procedure: excision of umbilical mass, small bowel resection;  Surgeon: Signe Mitzie LABOR, MD;  Location: [facility];  Service: General;  Laterality: N/A;   SPINE SURGERY     C6-C7 fusion x2   SPINE SURGERY     herniated disc L4-L5   TRIGGER FINGER RELEASE Left 03/14/2019   Procedure: RELEASE TRIGGER FINGER/A-1 PULLEY LEFT LONG FINGER;  Surgeon: Murrell Drivers, MD;  Location: [facility];  Service: Orthopedics;  Laterality: Left;  Bier block   TUBAL LIGATION      ALLERGIES: Allergies  Allergen Reactions   Morphine  And Codeine Nausea And Vomiting    Pt states also caused arm to turn black where medication was administered    Morphine  Other (See Comments)   Prednisone  Hives   Other Nausea And Vomiting    TYLOX    FAMILY HISTORY:  Family History  Problem Relation Age of Onset   Diabetes Mother    Heart disease Mother    Hypertension Mother    Lung  disease Father    Heart disease Father    Hypertension Father    Diabetes Father    Hypertension Sister    Hyperlipidemia Brother    Hypertension Brother    Heart attack Maternal Grandmother    Heart disease Maternal Grandmother    Hypertension Maternal Grandfather    Diabetes Maternal Grandfather    Heart disease Maternal Grandfather     SOCIAL HISTORY: Social History   Socioeconomic History   Marital status: Married    Spouse name: Not on file   Number of children: 2   Years  of education: college   Highest education level: Associate degree: academic program  Occupational History   Occupation: Corporate treasurer  Tobacco Use   Smoking status: Former    Current packs/day: 0.00    Average packs/day: 2.5 packs/day for 8.0 years (20.0 ttl pk-yrs)    Types: Cigarettes    Start date: 05/18/1975    Quit date: 05/18/1983    Years since quitting: 40.2   Smokeless tobacco: Never  Vaping Use   Vaping status: Never Used  Substance and Sexual Activity   Alcohol use: No    Alcohol/week: 0.0 standard drinks of alcohol   Drug use: No   Sexual activity: Never    Birth control/protection: Abstinence  Other Topics Concern   Not on file  Social History Narrative   Lives with husband and one of her children in a one story home.  Has 2 children.     Works as a Corporate treasurer at [employer].  Education: college.   Social Drivers of Health   Financial Resource Strain: Medium Risk (03/24/2023)   Overall Financial Resource Strain (CARDIA)    Difficulty of Paying Living Expenses: Somewhat hard  Food Insecurity: No Food Insecurity (03/24/2023)   Hunger Vital Sign    Worried About Running Out of Food in the Last Year: Never true    Ran Out of Food in the Last Year: Never true  Transportation Needs: No Transportation Needs (03/24/2023)   PRAPARE - Administrator, Civil Service (Medical): No    Lack of Transportation (Non-Medical): No  Physical Activity: Sufficiently Active (03/24/2023)    Exercise Vital Sign    Days of Exercise per Week: 3 days    Minutes of Exercise per Session: 50 min  Stress: No Stress Concern Present (03/24/2023)   [employer] of Occupational Health - Occupational Stress Questionnaire    Feeling of Stress : Only a little  Social Connections: Socially Integrated (03/24/2023)   Social Connection and Isolation Panel    Frequency of Communication with Friends and Family: More than three times a week    Frequency of Social Gatherings with Friends and Family: Once a week    Attends Religious Services: More than 4 times per year    Active Member of [employer] or Organizations: Yes    Attends Engineer, structural: More than 4 times per year    Marital Status: Married    MEDICATIONS:  Current Outpatient Medications  Medication Sig Dispense Refill   b complex vitamins capsule Take 1 capsule by mouth daily.     Biotin 1 MG CAPS Take 1 mg by mouth as needed.     cyclobenzaprine  (FLEXERIL ) 10 MG tablet TAKE 1 TABLET(10 MG) BY MOUTH AT BEDTIME (Patient taking differently: TAKE 1 TABLET(10 MG) BY MOUTH AT BEDTIME, patient taking as needed) 90 tablet 2   fluticasone  (FLONASE ) 50 MCG/ACT nasal spray Place 2 sprays into both nostrils daily. 16 g 0   furosemide  (LASIX ) 20 MG tablet One tab by mouth daily as needed for dependent edema during hot weather 30 tablet 0   glucose blood (CONTOUR NEXT TEST) test strip Use as instructedUSE AS DIRECTED TO TEST BLOOD GLUCOSE ONCE DAILY 100 strip 3   HYDROcodone -acetaminophen  (NORCO/VICODIN) 5-325 MG tablet Take 1 tablet by mouth every 8 (eight) hours as needed for moderate pain (pain score 4-6). 15 tablet 0   meclizine  (ANTIVERT ) 25 MG tablet Take 1 tablet (25 mg total) by mouth 3 (three) times daily as needed for dizziness. 30  tablet 0   multivitamin (VIT W/EXTRA C) CHEW chewable tablet Chew 1 tablet by mouth daily.     promethazine  (PHENERGAN ) 25 MG tablet TAKE 1 TABLET BY MOUTH EVERY 8 HOURS AS NEEDED FOR NAUSEA 90  tablet 0   ramipril  (ALTACE ) 5 MG capsule TAKE 1 CAPSULE(5 MG) BY MOUTH DAILY 30 capsule 3   rosuvastatin  (CRESTOR ) 5 MG tablet TAKE 1 TABLET(5 MG) BY MOUTH DAILY 90 tablet 3   valACYclovir  (VALTREX ) 500 MG tablet TAKE 1 TABLET(500 MG) BY MOUTH TWICE DAILY AS NEEDED FOR COLD SORES OR OUTBREAK 10 tablet 3   ALPRAZolam  (XANAX ) 0.5 MG tablet TAKE 1 TABLET(0.5 MG) BY MOUTH TWICE DAILY AS NEEDED FOR ANXIETY 60 tablet 3   tirzepatide  (MOUNJARO ) 5 MG/0.5ML Pen Inject 5 mg into the skin once a week. (Patient not taking: Reported on 08/04/2023) 6 mL 4   No current facility-administered medications for this visit.    PHYSICAL EXAM: Vitals:   08/04/23 0818  BP: 138/86  Pulse: 73  Resp: 20  SpO2: 99%  Weight: 154 lb 12.8 oz (70.2 kg)  Height: 5' 4 (1.626 m)   Body mass index is 26.57 kg/m.  Wt Readings from Last 3 Encounters:  08/04/23 154 lb 12.8 oz (70.2 kg)  06/23/23 142 lb 12.8 oz (64.8 kg)  05/08/23 128 lb (58.1 kg)    General: Well developed, well nourished female in no apparent distress.  HEENT: AT/[place], no external lesions.  Eyes: Conjunctiva clear and no icterus. Neck: Neck supple  Lungs: Respirations not labored Neurologic: Alert, oriented, normal speech Extremities / Skin: Dry.  Psychiatric: Does not appear depressed or anxious  Diabetic Foot Exam - Simple   No data filed    LABS Reviewed Lab Results  Component Value Date   HGBA1C 5.9 (H) 07/31/2023   HGBA1C 5.3 03/21/2023   HGBA1C 5.2 02/27/2023   Lab Results  Component Value Date   FRUCTOSAMINE 222 09/10/2020   FRUCTOSAMINE 263 03/19/2019   Lab Results  Component Value Date   CHOL 112 03/21/2023   HDL 62 03/21/2023   LDLCALC 36 03/21/2023   TRIG 56 03/21/2023   CHOLHDL 1.8 03/21/2023   Lab Results  Component Value Date   MICRALBCREAT NOTE 07/31/2023   MICRALBCREAT 5 03/24/2023   Lab Results  Component Value Date   CREATININE 0.69 08/01/2023   Lab Results  Component Value Date   GFR 87.98 08/25/2022     ASSESSMENT / PLAN  1. Type 2 diabetes mellitus without complication, without long-term current use of insulin  (HCC)   2. Osteopenia of hip, unspecified laterality   3. Vitamin D  deficiency      Diabetes Mellitus type 2, complicated by no known complications. - Diabetic status / severity: Controlled.  Lab Results  Component Value Date   HGBA1C 5.9 (H) 07/31/2023    - Hemoglobin A1c goal : <6.5%  Patient currently is not on diabetic medication, Mounjaro  was stopped in mid May due to abdominal pain.  - Medications: See below.  I) we will monitor without diabetic medication.  Patient is advised to monitor blood sugar at home with glucometer.  Discussed that blood sugar should be low 100 range in the fasting and around 2 hours after eating.  Advised to call our clinic if blood sugar in the fasting increased in the range of 150, will plan for diabetic medication at that point.  - Home glucose testing: At least in the morning fasting daily.  - Discussed/ Gave Hypoglycemia treatment plan.  #  Consult : not required at this time.   # Annual urine for microalbuminuria/ creatinine ratio, no microalbuminuria currently, continue ACE/ARB /ramipril . Last  Lab Results  Component Value Date   MICRALBCREAT NOTE 07/31/2023    # Foot check nightly.  # Annual dilated diabetic eye exams.   - Diet: Make healthy diabetic food choices - Life style / activity / exercise: Discussed.  2. Blood pressure  -  BP Readings from Last 1 Encounters:  08/04/23 138/86    - Control is in target.  - No change in current plans.  3. Lipid status / Hyperlipidemia - Last  Lab Results  Component Value Date   LDLCALC 36 03/21/2023   - Continue rosuvastatin  5 mg daily.  Managed by primary care provider.  # Osteopenia : Last DEXA scan was in April 2023.  Continue current intake of vitamin D  and calcium  supplement. - Continue current vitamin D  and calcium  supplement. - Plan for DEXA scan next  year.  Diagnoses and all orders for this visit:  Type 2 diabetes mellitus without complication, without long-term current use of insulin  (HCC)  Osteopenia of hip, unspecified laterality  Vitamin D  deficiency     DISPOSITION Follow up in clinic in 4 - 5 months suggested.   All questions answered and patient verbalized understanding of the plan.  Melissa Dontavious Emily, MD [facility] [address] [place], [address] [place], [place] [address] Phone # [phone]  At least part of this note was generated using voice recognition software. Inadvertent word errors may have occurred, which were not recognized during the proofreading process.

## 2023-09-14 ENCOUNTER — Encounter: Payer: Self-pay | Admitting: Internal Medicine

## 2023-09-14 ENCOUNTER — Telehealth: Payer: Self-pay | Admitting: Internal Medicine

## 2023-09-14 ENCOUNTER — Ambulatory Visit: Admitting: Internal Medicine

## 2023-09-14 VITALS — BP 140/70 | HR 90 | Ht 64.0 in | Wt 150.0 lb

## 2023-09-14 DIAGNOSIS — Z9884 Bariatric surgery status: Secondary | ICD-10-CM

## 2023-09-14 DIAGNOSIS — F439 Reaction to severe stress, unspecified: Secondary | ICD-10-CM

## 2023-09-14 DIAGNOSIS — R413 Other amnesia: Secondary | ICD-10-CM

## 2023-09-14 DIAGNOSIS — Z8659 Personal history of other mental and behavioral disorders: Secondary | ICD-10-CM

## 2023-09-14 DIAGNOSIS — I10 Essential (primary) hypertension: Secondary | ICD-10-CM

## 2023-09-14 DIAGNOSIS — E785 Hyperlipidemia, unspecified: Secondary | ICD-10-CM

## 2023-09-14 DIAGNOSIS — E119 Type 2 diabetes mellitus without complications: Secondary | ICD-10-CM

## 2023-09-14 LAB — FOLATE: Folate: >24 ng/mL

## 2023-09-14 LAB — VITAMIN B12: Vitamin B-12: >2000 pg/mL — ABNORMAL HIGH (ref 200–1100)

## 2023-09-14 NOTE — Progress Notes (Signed)
 Patient Care Team: Perri Ronal PARAS, MD as PCP - General  Visit Date: 09/14/23  Subjective:    Patient ID: Melissa Benson , Female   DOB: August 21, 1960, 63 y.o.    MRN: [id number]   63 y.o. Female presents today for office visit for memory issues and dizziness. Patient has a past medical history of  Diabetes Mellitus, Anxiety, Hyperlipidemia, Sleep apnea, Hypertension, Migraine Headaches, and paresthesias in feet secondary to lumbar disc disease.     She said the last few weeks she feels that her memory is getting worse. She said that systems she used to remember at work she finds herself forgetting and it is affecting her work. Her mother had Alzheimer's disease and she herself previously had a referral to a Neurologist but was unable to go. B-12 and folate tests were ordered to rule out deficiencies as the cause. She will be referred to [facility] Neurology for evaluation for complaint of memory loss. She has a hx of anxiety and has taken Xanax  for many years. 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 She says no manager has complained about her work at the [employer]. She has a positive depression score but does not want to see counselor or have med consult for depression. She is tearful in the office today.  History of Hypertension treated with Altace  5 mg daily. Blood pressure elevated  today at 140/70.   History of Hyperlipidemia treated with Rosuvastatin  5 mg daily.      History of Anxiety treated with Xanax  0.5 mg twice daily as needed. She generally takes it twice daily.   Past Medical History:  Diagnosis Date   Anxiety    Complication of anesthesia    Diabetes mellitus    GERD (gastroesophageal reflux disease)    Headache    no issues since stop caffeine   History of hiatal hernia    History of migraine headaches    History of urinary tract infection    last one 11/2014    History of vertigo    Hyperlipidemia    Hypertension    Neck pain, chronic    OSA (obstructive sleep apnea)    on  CPAP   PONV (postoperative nausea and vomiting)    Psoriasis    Rosacea    Weak urinary stream      Family History  Problem Relation Age of Onset   Diabetes Mother    Heart disease Mother    Hypertension Mother    Lung disease Father    Heart disease Father    Hypertension Father    Diabetes Father    Hypertension Sister    Hyperlipidemia Brother    Hypertension Brother    Heart attack Maternal Grandmother    Heart disease Maternal Grandmother    Hypertension Maternal Grandfather    Diabetes Maternal Grandfather    Heart disease Maternal Grandfather     Social History   Social History Narrative   Lives with husband and one of her children in a one story home.  Has 2 children.     Works as a Corporate treasurer at [employer].  Education: college.   Quit smoking over 39 years ago. Had 20 pack year hx. Does not consume alcohol or use illicit drugs. Does seem to suffer from anxiety and situational stress with family. Not able to quit job at this time. Needs to work.   Review of Systems  Psychiatric/Behavioral:  Positive for memory loss.  Objective:   Vitals: BP (!) 140/70   Pulse 90   Ht 5' 4 (1.626 m)   Wt 150 lb (68 kg)   SpO2 98%   BMI 25.75 kg/m    Physical Exam Neck:     Thyroid : No thyromegaly.  Cardiovascular:     Pulses: Normal pulses.     Heart sounds: Normal heart sounds.  Pulmonary:     Breath sounds: Normal breath sounds.       Results:      Labs:       Component Value Date/Time   NA 143 08/01/2023 1140   K 3.6 08/01/2023 1140   CL 106 08/01/2023 1140   CO2 27 08/01/2023 1140   GLUCOSE 154 (H) 08/01/2023 1140   BUN 9 08/01/2023 1140   CREATININE 0.69 08/01/2023 1140   CREATININE 0.68 07/31/2023 0752   CALCIUM  9.6 08/01/2023 1140   PROT 6.9 08/01/2023 1140   ALBUMIN 4.3 08/01/2023 1140   AST 27 08/01/2023 1140   ALT 19 08/01/2023 1140   ALKPHOS 87 08/01/2023 1140   BILITOT 0.3 08/01/2023 1140   GFRNONAA >60 08/01/2023 1140    GFRNONAA 81 12/03/2019 0905   GFRAA 94 12/03/2019 0905     Lab Results  Component Value Date   WBC 4.8 08/01/2023   HGB 11.5 (L) 08/01/2023   HCT 35.6 (L) 08/01/2023   MCV 96.2 08/01/2023   PLT 218 08/01/2023    Lab Results  Component Value Date   CHOL 112 03/21/2023   HDL 62 03/21/2023   LDLCALC 36 03/21/2023   TRIG 56 03/21/2023   CHOLHDL 1.8 03/21/2023    Lab Results  Component Value Date   HGBA1C 5.9 (H) 07/31/2023     Lab Results  Component Value Date   TSH 1.34 05/09/2023         Assessment & Plan:   Memory loss: She said the last few weeks she feels that her memory is getting worse. She said that systems she used to remember well at work she now finds herself forgetting and it is affecting her work. Her mother had Alzheimer's dementia and she herself previously had a referral to a Neurologist but was unable to go. 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  B-12 and folate tests were ordered to rule out deficiencies as the cause.  These results are normal. She is being referred to [facility]rology. These labs results are normal.   May  also need to consider Neuropsychological testing to see if she has a mild memory disorder Patient May benefit from Psychiatric consultation for medication management of anxiety and depression  and psychotherapy.However, she is disinclined to go for this.  Hyperlipidemia- treated with statin. I do not think low dose statin is causing memory issues. I think she has had family stress and family illness that is contributing to her complaint of memory loss. She may have some attention deficit issues but is not hyperactive.   I,Melissa Benson,acting as a scribe for Ronal JINNY Hailstone, MD.,have documented all relevant documentation on the behalf of Ronal JINNY Hailstone, MD,as directed by  Ronal JINNY Hailstone, MD while in the presence of Ronal JINNY Hailstone, MD.

## 2023-09-15 ENCOUNTER — Ambulatory Visit: Payer: Self-pay | Admitting: Internal Medicine

## 2023-09-18 ENCOUNTER — Other Ambulatory Visit: Payer: Self-pay | Admitting: Internal Medicine

## 2023-09-24 ENCOUNTER — Encounter: Payer: Self-pay | Admitting: Internal Medicine

## 2023-09-24 NOTE — Patient Instructions (Addendum)
 Patient requesting evaluation for memory loss. Family hx of memory loss in her mother. Making referral to Neurology to see if patient has neurological deficit. Has declined referral for counseling for anxiety and stress. Has been on Xanax  for years for anxiety. B12 and folate level are normal.

## 2023-10-11 ENCOUNTER — Encounter: Payer: Self-pay | Admitting: Physician Assistant

## 2023-10-13 ENCOUNTER — Telehealth: Admitting: Family Medicine

## 2023-10-13 DIAGNOSIS — N3000 Acute cystitis without hematuria: Secondary | ICD-10-CM | POA: Diagnosis not present

## 2023-10-13 DIAGNOSIS — B3731 Acute candidiasis of vulva and vagina: Secondary | ICD-10-CM | POA: Diagnosis not present

## 2023-10-13 MED ORDER — FLUCONAZOLE 150 MG PO TABS
150.0000 mg | ORAL_TABLET | Freq: Every day | ORAL | 0 refills | Status: AC
Start: 2023-10-13 — End: 2023-10-14

## 2023-10-13 MED ORDER — CEPHALEXIN 500 MG PO CAPS
500.0000 mg | ORAL_CAPSULE | Freq: Two times a day (BID) | ORAL | 0 refills | Status: AC
Start: 2023-10-13 — End: 2023-10-20

## 2023-10-13 NOTE — Progress Notes (Signed)
 We are sorry that you are not feeling well.  Here is how we plan to help!  Based on what you shared with me it looks like you most likely have a simple urinary tract infection.  A UTI (Urinary Tract Infection) is a bacterial infection of the bladder.  Most cases of urinary tract infections are simple to treat but a key part of your care is to encourage you to drink plenty of fluids and watch your symptoms carefully.  I have prescribed Keflex  500 mg twice a day for 7 days.  Your symptoms should gradually improve. Call us  if the burning in your urine worsens, you develop worsening fever, back pain or pelvic pain or if your symptoms do not resolve after completing the antibiotic.  Urinary tract infections can be prevented by drinking plenty of water  to keep your body hydrated.  Also be sure when you wipe, wipe from front to back and don't hold it in!  If possible, empty your bladder every 4 hours.  Your e-visit answers were reviewed by a board certified advanced clinical practitioner to complete your personal care plan.  Depending on the condition, your plan could have included both over the counter or prescription medications.  If there is a problem please reply  once you have received a response from your provider.  Your safety is important to us .  If you have drug allergies check your prescription carefully.    You can use MyChart to ask questions about today's visit, request a non-urgent call back, or ask for a work or school excuse for 24 hours related to this e-Visit. If it has been greater than 24 hours you will need to follow up with your provider, or enter a new e-Visit to address those concerns.   You will get an e-mail in the next two days asking about your experience.  I hope that your e-visit has been valuable and will speed your recovery. Thank you for using e-visits.  I have spent 5 minutes in review of e-visit questionnaire, review and updating patient chart, medical decision  making and response to patient.   Negan Grudzien, FNP   We are sorry that you are not feeling well. Here is how we plan to help! Based on what you shared with me it looks like you: May have a yeast vaginosis  Vaginosis is an inflammation of the vagina that can result in discharge, itching and pain. The cause is usually a change in the normal balance of vaginal bacteria or an infection. Vaginosis can also result from reduced estrogen levels after menopause.  The most common causes of vaginosis are:   Bacterial vaginosis which results from an overgrowth of one on several organisms that are normally present in your vagina.   Yeast infections which are caused by a naturally occurring fungus called candida.   Vaginal atrophy (atrophic vaginosis) which results from the thinning of the vagina from reduced estrogen levels after menopause.   Trichomoniasis which is caused by a parasite and is commonly transmitted by sexual intercourse.  Factors that increase your risk of developing vaginosis include: Medications, such as antibiotics and steroids Uncontrolled diabetes Use of hygiene products such as bubble bath, vaginal spray or vaginal deodorant Douching Wearing damp or tight-fitting clothing Using an intrauterine device (IUD) for birth control Hormonal changes, such as those associated with pregnancy, birth control pills or menopause Sexual activity Having a sexually transmitted infection  Your treatment plan is A single Diflucan  (fluconazole ) 150mg  tablet once.  I have electronically sent this prescription into the pharmacy that you have chosen.  Be sure to take all of the medication as directed. Stop taking any medication if you develop a rash, tongue swelling or shortness of breath. Mothers who are breast feeding should consider pumping and discarding their breast milk while on these antibiotics. However, there is no consensus that infant exposure at these doses would be harmful.  Remember  that medication creams can weaken latex condoms.   HOME CARE:  Good hygiene may prevent some types of vaginosis from recurring and may relieve some symptoms:  Avoid baths, hot tubs and whirlpool spas. Rinse soap from your outer genital area after a shower, and dry the area well to prevent irritation. Don't use scented or harsh soaps, such as those with deodorant or antibacterial action. Avoid irritants. These include scented tampons and pads. Wipe from front to back after using the toilet. Doing so avoids spreading fecal bacteria to your vagina.  Other things that may help prevent vaginosis include:  Don't douche. Your vagina doesn't require cleansing other than normal bathing. Repetitive douching disrupts the normal organisms that reside in the vagina and can actually increase your risk of vaginal infection. Douching won't clear up a vaginal infection. Use a latex condom. Both female and female latex condoms may help you avoid infections spread by sexual contact. Wear cotton underwear. Also wear pantyhose with a cotton crotch. If you feel comfortable without it, skip wearing underwear to bed. Yeast thrives in [employer] Your symptoms should improve in the next day or two.  GET HELP RIGHT AWAY IF:  You have pain in your lower abdomen ( pelvic area or over your ovaries) You develop nausea or vomiting You develop a fever Your discharge changes or worsens You have persistent pain with intercourse You develop shortness of breath, a rapid pulse, or you faint.  These symptoms could be signs of problems or infections that need to be evaluated by a medical provider now.  MAKE SURE YOU   Understand these instructions. Will watch your condition. Will get help right away if you are not doing well or get worse.  Your e-visit answers were reviewed by a board certified advanced clinical practitioner to complete your personal care plan. Depending upon the condition, your plan could have  included both over the counter or prescription medications. Please review your pharmacy choice to make sure that you have choses a pharmacy that is open for you to pick up any needed prescription, Your safety is important to us . If you have drug allergies check your prescription carefully.   You can use MyChart to ask questions about today's visit, request a non-urgent call back, or ask for a work or school excuse for 24 hours related to this e-Visit. If it has been greater than 24 hours you will need to follow up with your provider, or enter a new e-Visit to address those concerns. You will get a MyChart message within the next two days asking about your experience. I hope that your e-visit has been valuable and will speed your recovery.  I have spent 5 minutes in review of e-visit questionnaire, review and updating patient chart, medical decision making and response to patient.   Yi Falletta, FNP

## 2023-10-18 ENCOUNTER — Other Ambulatory Visit: Payer: Self-pay | Admitting: Internal Medicine

## 2023-10-28 ENCOUNTER — Telehealth: Admitting: Physician Assistant

## 2023-10-28 DIAGNOSIS — J019 Acute sinusitis, unspecified: Secondary | ICD-10-CM | POA: Diagnosis not present

## 2023-10-28 DIAGNOSIS — B9789 Other viral agents as the cause of diseases classified elsewhere: Secondary | ICD-10-CM | POA: Diagnosis not present

## 2023-10-28 MED ORDER — CETIRIZINE HCL 10 MG PO TABS: 10.0000 mg | ORAL_TABLET | Freq: Every day | ORAL | 11 refills | Status: AC

## 2023-10-28 MED ORDER — AZELASTINE HCL 0.1 % NA SOLN: 2.0000 | Freq: Two times a day (BID) | NASAL | 12 refills | Status: AC

## 2023-10-28 NOTE — Progress Notes (Signed)
 We are sorry that you are not feeling well.  Here is how we plan to help!  Based on what you have shared with me it looks like you have sinusitis.  Sinusitis is inflammation and infection in the sinus cavities of the head.  Based on your presentation I believe you most likely have Acute Viral Sinusitis.This is an infection most likely caused by a virus. There is not specific treatment for viral sinusitis other than to help you with the symptoms until the infection runs its course.  You may use an oral decongestant such as Mucinex D or if you have glaucoma or high blood pressure use plain Mucinex. Saline nasal spray help and can safely be used as often as needed for congestion, I have prescribed: Azelastine nasal spray 2 sprays in each nostril twice a day  Some authorities believe that zinc sprays or the use of Echinacea may shorten the course of your symptoms.  Sinus infections are not as easily transmitted as other respiratory infection, however we still recommend that you avoid close contact with loved ones, especially the very young and elderly.  Remember to wash your hands thoroughly throughout the day as this is the number one way to prevent the spread of infection!  Home Care: Only take medications as instructed by your medical team. Do not take these medications with alcohol. A steam or ultrasonic humidifier can help congestion.  You can place a towel over your head and breathe in the steam from hot water  coming from a faucet. Avoid close contacts especially the very young and the elderly. Cover your mouth when you cough or sneeze. Always remember to wash your hands.  Get Help Right Away If: You develop worsening fever or sinus pain. You develop a severe head ache or visual changes. Your symptoms persist after you have completed your treatment plan.  Make sure you Understand these instructions. Will watch your condition. Will get help right away if you are not doing well or get  worse.  Your e-visit answers were reviewed by a board certified advanced clinical practitioner to complete your personal care plan.  Depending on the condition, your plan could have included both over the counter or prescription medications.  If there is a problem please reply  once you have received a response from your provider.  Your safety is important to us .  If you have drug allergies check your prescription carefully.    You can use MyChart to ask questions about today's visit, request a non-urgent call back, or ask for a work or school excuse for 24 hours related to this e-Visit. If it has been greater than 24 hours you will need to follow up with your provider, or enter a new e-Visit to address those concerns.  You will get an e-mail in the next two days asking about your experience.  I hope that your e-visit has been valuable and will speed your recovery. Thank you for using e-visits.  I have spent 5 minutes in review of e-visit questionnaire, review and updating patient chart, medical decision making and response to patient.   Teena Shuck, PA-C

## 2023-11-10 ENCOUNTER — Other Ambulatory Visit: Payer: Self-pay | Admitting: Internal Medicine

## 2023-11-29 ENCOUNTER — Other Ambulatory Visit

## 2023-11-29 DIAGNOSIS — M85859 Other specified disorders of bone density and structure, unspecified thigh: Secondary | ICD-10-CM | POA: Diagnosis not present

## 2023-11-29 LAB — VITAMIN D 25 HYDROXY (VIT D DEFICIENCY, FRACTURES): Vit D, 25-Hydroxy: 58 ng/mL (ref 30–100)

## 2023-12-05 ENCOUNTER — Encounter: Payer: Self-pay | Admitting: Endocrinology

## 2023-12-05 ENCOUNTER — Ambulatory Visit: Admitting: Endocrinology

## 2023-12-05 ENCOUNTER — Ambulatory Visit: Payer: Self-pay | Admitting: Endocrinology

## 2023-12-05 VITALS — BP 152/88 | HR 75 | Resp 16 | Ht 64.0 in | Wt 161.2 lb

## 2023-12-05 DIAGNOSIS — Z7985 Long-term (current) use of injectable non-insulin antidiabetic drugs: Secondary | ICD-10-CM

## 2023-12-05 DIAGNOSIS — E119 Type 2 diabetes mellitus without complications: Secondary | ICD-10-CM

## 2023-12-05 DIAGNOSIS — M85859 Other specified disorders of bone density and structure, unspecified thigh: Secondary | ICD-10-CM | POA: Diagnosis not present

## 2023-12-05 LAB — POCT GLYCOSYLATED HEMOGLOBIN (HGB A1C): Hemoglobin A1C: 5.9 % — AB (ref 4.0–5.6)

## 2023-12-05 MED ORDER — TIRZEPATIDE 2.5 MG/0.5ML ~~LOC~~ SOAJ: 2.5000 mg | SUBCUTANEOUS | 0 refills | Status: AC

## 2023-12-05 MED ORDER — TIRZEPATIDE 5 MG/0.5ML ~~LOC~~ SOAJ: 5.0000 mg | SUBCUTANEOUS | 4 refills | Status: AC

## 2023-12-05 NOTE — Progress Notes (Signed)
 Outpatient Endocrinology Note Melissa Koper, MD   Patient's Name: Melissa Benson    DOB: [birth year]    MRN: [id number]                                                    REASON OF VISIT: Follow up for type 2 diabetes mellitus  PCP: Perri Ronal PARAS, MD  HISTORY OF PRESENT ILLNESS:   UDELL BLASINGAME is a 63 y.o. old female with past medical history listed below, is here for follow up for type 2 diabetes mellitus /osteopenia.   Pertinent Diabetes History: Patient was previously seen by Dr. Von and was last time seen in August 2024.  Patient was diagnosed with type 2 diabetes mellitus in 2009.  Patient was treated with various oral hypoglycemic drugs and was also relatively on higher dose of basal insulin  in the past.  Restart of Victoza  she was able to gradually cut down on insulin  requirement and also lost significant amount of weight.  She had gastric bypass surgery in 2017 and was able to stop insulin .  She had tried metformin  alone in 2018 for about a year and was ultimately stopped.  Chronic Diabetes Complications : Retinopathy: no. Last ophthalmology exam was done on annually 07/2023, following with ophthalmology regularly.  Nephropathy: no, on ACE/ARB /ramipril . Peripheral neuropathy: Mild tingling of the toes.  On gabapentin  primarily for back pain and the leg pain. Coronary artery disease: no Stroke: no  Relevant comorbidities and cardiovascular risk factors: Obesity: no Body mass index is 27.67 kg/m.  Status post gastric bypass surgery in 2017. Hypertension: Yes  Hyperlipidemia : Yes, on statin   Current / Home Diabetic regimen includes:  None  Prior diabetic medications: Various oral antidiabetic medication and basal insulin  in the past.  Nausea and diarrhea with metformin .  Nausea from glipizide . Stopped taking Mounjaro  in May 2025 with gastroenterology advised due to right upper quadrant abdominal pain.  Glycemic data:   [employer] Contour next one  glucometer, download from November 18 to December 2 , 2025, average blood sugar 156.  Lowest blood sugar 109, highest blood sugar 279.  She has been taking blood sugar 2 times a day at different times of the day, fasting blood sugar 144, 163, 180, 199, 134.  Blood sugar in the afternoon 121, 138, 138, 136.  Blood sugar in the evening 164, 198, 178.    Hypoglycemia: Patient has no hypoglycemic episodes. Patient has hypoglycemia awareness.  Factors modifying glucose control: 1.  Diabetic diet assessment: 3 meals a day.  2.  Staying active or exercising: Walking.  3.  Medication compliance: compliant all of the time.  # Osteopenia : Last DEXA scan in April 2023 T score is -1.3 at the hip and previously in 2021 was - 1.4, FRAX score: 10 year major osteoporotic risk: 13.4%. 10 year hip fracture risk: 1.0%.   She has been taking vitamin D  and calcium  supplement.  Interval history  Abdominal pain persistent even stopping mounjaro .  He has been following with gastroenterology.  She gained some weight about 10 pounds from last 3 months.  Her average blood sugar has went up with mild hyperglycemia.  Hemoglobin A1c today 5.9%.  She would like to restart Mounjaro .  She has no fall and fracture.  She has been taking vitamin D  and calcium  supplement.  Recent vitamin D  level acceptable.  No other complaints today.   REVIEW OF SYSTEMS As per history of present illness.   PAST MEDICAL HISTORY: Past Medical History:  Diagnosis Date   Anxiety    Complication of anesthesia    Diabetes mellitus    GERD (gastroesophageal reflux disease)    Headache    no issues since stop caffeine   History of hiatal hernia    History of migraine headaches    History of urinary tract infection    last one 11/2014    History of vertigo    Hyperlipidemia    Hypertension    Neck pain, chronic    OSA (obstructive sleep apnea)    on CPAP   PONV (postoperative nausea and vomiting)    Psoriasis    Rosacea    Weak  urinary stream     PAST SURGICAL HISTORY: Past Surgical History:  Procedure Laterality Date   2D ECHOCARDIOGRAM  07/20/2011   EF >55%, normal   ABDOMINAL HYSTERECTOMY     APPENDECTOMY     CARDIOVASCULAR STRESS TEST  07/20/2011   Normal myocardial perfusion study, EKG negative for ischemia, no ECG changes   CHOLECYSTECTOMY     colonscopy      polyps removed    DIAGNOSTIC LAPAROSCOPY     DILATION AND CURETTAGE OF UTERUS     LAPAROSCOPIC ROUX-EN-Y GASTRIC BYPASS WITH HIATAL HERNIA REPAIR N/A 01/12/2015   Procedure: LAPAROSCOPIC ROUX-EN-Y GASTRIC BYPASS WITH LYSIS OF ADHESIONS ;  Surgeon: Donnice Lunger, MD;  Location: [facility];  Service: General;  Laterality: N/A;   LAPAROSCOPY  02/01/2017   Laparoscopic lysis of adhesions   LAPAROSCOPY N/A 02/01/2017   Procedure: LAPAROSCOPY Lysis of adhesions;  Surgeon: Signe Mitzie LABOR, MD;  Location: [facility];  Service: General;  Laterality: N/A;   LAPAROTOMY N/A 02/01/2017   Procedure: excision of umbilical mass, small bowel resection;  Surgeon: Signe Mitzie LABOR, MD;  Location: [facility];  Service: General;  Laterality: N/A;   SPINE SURGERY     C6-C7 fusion x2   SPINE SURGERY     herniated disc L4-L5   TRIGGER FINGER RELEASE Left 03/14/2019   Procedure: RELEASE TRIGGER FINGER/A-1 PULLEY LEFT LONG FINGER;  Surgeon: Murrell Drivers, MD;  Location: [facility];  Service: Orthopedics;  Laterality: Left;  Bier block   TUBAL LIGATION      ALLERGIES: Allergies  Allergen Reactions   Morphine  And Codeine Nausea And Vomiting    Pt states also caused arm to turn black where medication was administered    Morphine  Other (See Comments)   Prednisone  Hives   Other Nausea And Vomiting    TYLOX    FAMILY HISTORY:  Family History  Problem Relation Age of Onset   Diabetes Mother    Heart disease Mother    Hypertension Mother    Lung disease Father    Heart disease Father    Hypertension Father    Diabetes Father    Hypertension Sister     Hyperlipidemia Brother    Hypertension Brother    Heart attack Maternal Grandmother    Heart disease Maternal Grandmother    Hypertension Maternal Grandfather    Diabetes Maternal Grandfather    Heart disease Maternal Grandfather     SOCIAL HISTORY: Social History   Socioeconomic History   Marital status: Married    Spouse name: Not on file   Number of children: 2   Years of education: college   Highest education  level: Associate degree: academic program  Occupational History   Occupation: corporate treasurer  Tobacco Use   Smoking status: Former    Current packs/day: 0.00    Average packs/day: 2.5 packs/day for 8.0 years (20.0 ttl pk-yrs)    Types: Cigarettes    Start date: 05/18/1975    Quit date: 05/18/1983    Years since quitting: 40.5   Smokeless tobacco: Never  Vaping Use   Vaping status: Never Used  Substance and Sexual Activity   Alcohol use: No    Alcohol/week: 0.0 standard drinks of alcohol   Drug use: No   Sexual activity: Never    Birth control/protection: Abstinence  Other Topics Concern   Not on file  Social History Narrative   Lives with husband and one of her children in a one story home.  Has 2 children.     Works as a corporate treasurer at [employer].  Education: college.   Social Drivers of Health   Financial Resource Strain: Medium Risk (03/24/2023)   Overall Financial Resource Strain (CARDIA)    Difficulty of Paying Living Expenses: Somewhat hard  Food Insecurity: No Food Insecurity (03/24/2023)   Hunger Vital Sign    Worried About Running Out of Food in the Last Year: Never true    Ran Out of Food in the Last Year: Never true  Transportation Needs: No Transportation Needs (03/24/2023)   PRAPARE - Administrator, Civil Service (Medical): No    Lack of Transportation (Non-Medical): No  Physical Activity: Sufficiently Active (03/24/2023)   Exercise Vital Sign    Days of Exercise per Week: 3 days    Minutes of Exercise per Session: 50 min  Stress:  No Stress Concern Present (03/24/2023)   [employer] of Occupational Health - Occupational Stress Questionnaire    Feeling of Stress : Only a little  Social Connections: Socially Integrated (03/24/2023)   Social Connection and Isolation Panel    Frequency of Communication with Friends and Family: More than three times a week    Frequency of Social Gatherings with Friends and Family: Once a week    Attends Religious Services: More than 4 times per year    Active Member of [employer] or Organizations: Yes    Attends Engineer, Structural: More than 4 times per year    Marital Status: Married    MEDICATIONS:  Current Outpatient Medications  Medication Sig Dispense Refill   ALPRAZolam  (XANAX ) 0.5 MG tablet TAKE 1 TABLET(0.5 MG) BY MOUTH TWICE DAILY AS NEEDED FOR ANXIETY 60 tablet 3   azelastine  (ASTELIN ) 0.1 % nasal spray Place 2 sprays into both nostrils 2 (two) times daily. Use in each nostril as directed 30 mL 12   b complex vitamins capsule Take 1 capsule by mouth daily.     Biotin 1 MG CAPS Take 1 mg by mouth as needed.     cetirizine  (ZYRTEC ) 10 MG tablet Take 1 tablet (10 mg total) by mouth daily. 30 tablet 11   cyclobenzaprine  (FLEXERIL ) 10 MG tablet TAKE 1 TABLET(10 MG) BY MOUTH AT BEDTIME (Patient taking differently: TAKE 1 TABLET(10 MG) BY MOUTH AT BEDTIME, patient taking as needed) 90 tablet 2   fluticasone  (FLONASE ) 50 MCG/ACT nasal spray Place 2 sprays into both nostrils daily. 16 g 0   furosemide  (LASIX ) 20 MG tablet One tab by mouth daily as needed for dependent edema during hot weather 30 tablet 0   glucose blood (CONTOUR NEXT TEST) test strip Use as instructedUSE AS  DIRECTED TO TEST BLOOD GLUCOSE ONCE DAILY 100 strip 3   meclizine  (ANTIVERT ) 25 MG tablet Take 1 tablet (25 mg total) by mouth 3 (three) times daily as needed for dizziness. 30 tablet 0   multivitamin (VIT W/EXTRA C) CHEW chewable tablet Chew 1 tablet by mouth daily.     mupirocin  ointment (BACTROBAN ) 2 %  APPLY TOPICALLY TO THE AFFECTED AREA TWICE DAILY 22 g 1   promethazine  (PHENERGAN ) 25 MG tablet TAKE 1 TABLET BY MOUTH EVERY 8 HOURS AS NEEDED FOR NAUSEA 90 tablet 0   ramipril  (ALTACE ) 5 MG capsule TAKE 1 CAPSULE(5 MG) BY MOUTH DAILY 30 capsule 3   rosuvastatin  (CRESTOR ) 5 MG tablet TAKE 1 TABLET(5 MG) BY MOUTH DAILY 90 tablet 3   tirzepatide  (MOUNJARO ) 2.5 MG/0.5ML Pen Inject 2.5 mg into the skin once a week. 2 mL 0   tirzepatide  (MOUNJARO ) 5 MG/0.5ML Pen Inject 5 mg into the skin once a week. After completion of 2.5 mg dose for 4 weeks. 6 mL 4   valACYclovir  (VALTREX ) 500 MG tablet TAKE 1 TABLET(500 MG) BY MOUTH TWICE DAILY AS NEEDED FOR COLD SORES OR OUTBREAK 10 tablet 3   HYDROcodone -acetaminophen  (NORCO/VICODIN) 5-325 MG tablet Take 1 tablet by mouth every 8 (eight) hours as needed for moderate pain (pain score 4-6). 15 tablet 0   No current facility-administered medications for this visit.    PHYSICAL EXAM: Vitals:   12/05/23 0816  BP: (!) 152/88  Pulse: 75  Resp: 16  SpO2: 98%  Weight: 161 lb 3.2 oz (73.1 kg)  Height: 5' 4 (1.626 m)    Body mass index is 27.67 kg/m.  Wt Readings from Last 3 Encounters:  12/05/23 161 lb 3.2 oz (73.1 kg)  09/14/23 150 lb (68 kg)  08/04/23 154 lb 12.8 oz (70.2 kg)    General: Well developed, well nourished female in no apparent distress.  HEENT: AT/[place], no external lesions.  Eyes: Conjunctiva clear and no icterus. Neck: Neck supple  Lungs: Respirations not labored Neurologic: Alert, oriented, normal speech Extremities / Skin: Dry.  Psychiatric: Does not appear depressed or anxious  Diabetic Foot Exam - Simple   No data filed    LABS Reviewed Lab Results  Component Value Date   HGBA1C 5.9 (A) 12/05/2023   HGBA1C 5.9 (H) 07/31/2023   HGBA1C 5.3 03/21/2023   Lab Results  Component Value Date   FRUCTOSAMINE 222 09/10/2020   FRUCTOSAMINE 263 03/19/2019   Lab Results  Component Value Date   CHOL 112 03/21/2023   HDL 62  03/21/2023   LDLCALC 36 03/21/2023   TRIG 56 03/21/2023   CHOLHDL 1.8 03/21/2023   Lab Results  Component Value Date   MICRALBCREAT NOTE 07/31/2023   MICRALBCREAT 5 03/24/2023   Lab Results  Component Value Date   CREATININE 0.69 08/01/2023   Lab Results  Component Value Date   GFR 87.98 08/25/2022    ASSESSMENT / PLAN  1. Type 2 diabetes mellitus without complication, without long-term current use of insulin  (HCC)   2. Osteopenia of hip, unspecified laterality     Diabetes Mellitus type 2, complicated by no known complications. - Diabetic status / severity: Controlled.  Lab Results  Component Value Date   HGBA1C 5.9 (A) 12/05/2023    - Hemoglobin A1c goal : <6.5%  Glucose data as reviewed above, mild hyperglycemia.  Patient continued to have abdominal pain even after stopping Mounjaro , she has been regularly following with gastroenterology.  She would like to restart Mounjaro .  She also gained some weight.  - Medications: See below.  I) restart Mounjaro  2.5 mg weekly for 4 weeks and increase to 5 mg weekly.   - Home glucose testing: At least in the morning fasting daily.  - Discussed/ Gave Hypoglycemia treatment plan.  # Consult : not required at this time.   # Annual urine for microalbuminuria/ creatinine ratio, no microalbuminuria currently, continue ACE/ARB /ramipril . Last  Lab Results  Component Value Date   MICRALBCREAT NOTE 07/31/2023    # Foot check nightly.  # Annual dilated diabetic eye exams.   - Diet: Make healthy diabetic food choices - Life style / activity / exercise: Discussed.  2. Blood pressure  -  BP Readings from Last 1 Encounters:  12/05/23 (!) 152/88    - Control is not in target. Advised to monitor at home and if remains high asked to discuss with primary care provider. - No change in current plans.  3. Lipid status / Hyperlipidemia - Last  Lab Results  Component Value Date   LDLCALC 36 03/21/2023   - Continue  rosuvastatin  5 mg daily.  Managed by primary care provider.  # Osteopenia : Last DEXA scan was in April 2023.  Continue current intake of vitamin D  and calcium  supplement. - Continue current vitamin D  and calcium  supplement.  Recent vitamin D  level normal. - Check DEXA scan in 3 months prior to follow-up visit.  Diagnoses and all orders for this visit:  Type 2 diabetes mellitus without complication, without long-term current use of insulin  (HCC) -     POCT glycosylated hemoglobin (Hb A1C) -     tirzepatide  (MOUNJARO ) 2.5 MG/0.5ML Pen; Inject 2.5 mg into the skin once a week. -     tirzepatide  (MOUNJARO ) 5 MG/0.5ML Pen; Inject 5 mg into the skin once a week. After completion of 2.5 mg dose for 4 weeks.  Osteopenia of hip, unspecified laterality -     DG Bone Density; Future    DISPOSITION Follow up in clinic in 4 months suggested.   All questions answered and patient verbalized understanding of the plan.  Alexandrea Westergard, MD [facility] Endocrinology [facility] [address] [place], [address] [place], [place] [address] Phone # [phone]  At least part of this note was generated using voice recognition software. Inadvertent word errors may have occurred, which were not recognized during the proofreading process.

## 2023-12-07 DIAGNOSIS — R1013 Epigastric pain: Secondary | ICD-10-CM | POA: Diagnosis not present

## 2023-12-07 DIAGNOSIS — K5730 Diverticulosis of large intestine without perforation or abscess without bleeding: Secondary | ICD-10-CM | POA: Diagnosis not present

## 2023-12-07 DIAGNOSIS — K5904 Chronic idiopathic constipation: Secondary | ICD-10-CM | POA: Diagnosis not present

## 2023-12-07 DIAGNOSIS — K219 Gastro-esophageal reflux disease without esophagitis: Secondary | ICD-10-CM | POA: Diagnosis not present

## 2023-12-08 ENCOUNTER — Telehealth: Payer: Self-pay

## 2023-12-08 NOTE — Telephone Encounter (Signed)
 VM left requesting callback to schedule Dexascan.

## 2023-12-08 NOTE — Telephone Encounter (Signed)
 Patient sent message to see if she had a preference of what location to get Dexascan done. Awaiting patient's reply.

## 2023-12-19 NOTE — Telephone Encounter (Signed)
 done

## 2023-12-21 ENCOUNTER — Other Ambulatory Visit: Payer: Self-pay | Admitting: Internal Medicine

## 2023-12-22 DIAGNOSIS — Z934 Other artificial openings of gastrointestinal tract status: Secondary | ICD-10-CM | POA: Diagnosis not present

## 2023-12-22 DIAGNOSIS — K219 Gastro-esophageal reflux disease without esophagitis: Secondary | ICD-10-CM | POA: Diagnosis not present

## 2023-12-22 DIAGNOSIS — K259 Gastric ulcer, unspecified as acute or chronic, without hemorrhage or perforation: Secondary | ICD-10-CM | POA: Diagnosis not present

## 2023-12-22 DIAGNOSIS — R1013 Epigastric pain: Secondary | ICD-10-CM | POA: Diagnosis not present

## 2023-12-26 NOTE — Progress Notes (Signed)
 "   Assessment/Plan:   Melissa Benson is a very pleasant 63 y.o. year old RH female with a history of hypertension, diabetes mellitus, mild anemia anxiety, depression sleep apnea no longer on CPAP  after weight loss, psoriasis, GERD, s/p gastric bypass 2017, chronic headaches, BPPV, chronic low back pain due to lumbar disc disease, seen today for evaluation of memory loss. MoCA today is 24/30.  Etiology is unclear although she has strong family history of Alzheimer's disease.  Patient is able to participate on ADLs but she reports trouble at work due to memory loss.  Patient continues to drive, reported that she has been experiencing some difficulty with orientation.  Mood appears to be depressed due to the current situation.  Workup is in progress.  Memory Impairment of unclear etiology  MRI brain without contrast to assess for underlying structural abnormality and assess vascular load  Neurocognitive testing to further evaluate cognitive concerns and determine other underlying cause of memory changes, including potential contribution from sleep, anxiety, attention, or depression  Plasma biomarkers determine the risk of developing Alzheimer's disease in view of strong family history of with Check TSH Continue to control mood as per PCP Recommend psychiatry and psychotherapy for situational depression  recommend good control of cardiovascular risk factors Monitor driving [place] up in 2 to 3 months, at which time, pending the above results, we will consider antidementia medication  Subjective:   The patient is here alone.   Discussed the use of AI scribe software for clinical note transcription with the patient, who gave verbal consent to proceed.  History of Present Illness Melissa Benson is a 63 year old female who presents with memory concerns.  She has been experiencing memory difficulties over the past two years, particularly with recalling work-related systems she has used for  thirty years. 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 Her long-term memory remains intact, and she engages in activities like Sudoku and genealogy to stimulate her brain. There is a family history of Alzheimer's disease, with her mother and several maternal aunts affected, which heightens her concern about her memory issues. She occasionally misplaces items, such as her husband's Christmas bonus check still unable to find it.  She experiences occasional paranoia about security, leading her to install security cameras and frequently check locks. No hallucinations or disorientation at home.    She has a history of depression and was previously prescribed Xanax  for anxiety, which she stopped in October due to concerns about memory impact. She also stopped taking muscle relaxers and promethazine , reporting mild improvement in memory since discontinuing these medications.  She has a history of chronic headaches since her late 30s, likely tension headaches, and sometimes associated with blood pressure without any vomiting, occasional nausea, treated with ibuprofen, which she can no longer take due to digestive issues. She reports less frequent headaches.  She had been seen in our office in 2018 with negative workup.    She has chronic low back pain, with numbness and pain in the lumbar area.  She had been seen in our office in the past, with EMG showing L5 right radiculopathy without evidence of cervical motor radiculopathy, median neuropathy or large fiber generalized motor polyneuropathy.  She denies needing a cane or walker.  She has a history of a fall resulting in a compressed vertebrae during a colonoscopy prep, leading to a brief loss of consciousness. No strokes  or significant vision changes, though she had cataract surgery due to early cataract development.  She continues to drive,sometimes  forgets the path taken while driving or walking.       Pertinent available labs November 2025:  Vitamin D  25 was 58 folate normal B12 greater than 2000, H&H 11.5-35.6    Past Medical History:  Diagnosis Date   Anxiety    Complication of anesthesia    Diabetes mellitus    GERD (gastroesophageal reflux disease)    Headache    no issues since stop caffeine   History of hiatal hernia    History of migraine headaches    History of urinary tract infection    last one 11/2014    History of vertigo    Hyperlipidemia    Hypertension    Neck pain, chronic    OSA (obstructive sleep apnea)    on CPAP   PONV (postoperative nausea and vomiting)    Psoriasis    Rosacea    Weak urinary stream      Past Surgical History:  Procedure Laterality Date   2D ECHOCARDIOGRAM  07/20/2011   EF >55%, normal   ABDOMINAL HYSTERECTOMY     APPENDECTOMY     CARDIOVASCULAR STRESS TEST  07/20/2011   Normal myocardial perfusion study, EKG negative for ischemia, no ECG changes   CHOLECYSTECTOMY     colonscopy      polyps removed    DIAGNOSTIC LAPAROSCOPY     DILATION AND CURETTAGE OF UTERUS     LAPAROSCOPIC ROUX-EN-Y GASTRIC BYPASS WITH HIATAL HERNIA REPAIR N/A 01/12/2015   Procedure: LAPAROSCOPIC ROUX-EN-Y GASTRIC BYPASS WITH LYSIS OF ADHESIONS ;  Surgeon: Donnice Lunger, MD;  Location: [facility];  Service: General;  Laterality: N/A;   LAPAROSCOPY  02/01/2017   Laparoscopic lysis of adhesions   LAPAROSCOPY N/A 02/01/2017   Procedure: LAPAROSCOPY Lysis of adhesions;  Surgeon: Signe Mitzie LABOR, MD;  Location: [facility];  Service: General;  Laterality: N/A;   LAPAROTOMY N/A 02/01/2017   Procedure: excision of umbilical mass, small bowel resection;  Surgeon: Signe Mitzie LABOR, MD;  Location: [facility];  Service: General;  Laterality: N/A;   SPINE SURGERY     C6-C7 fusion x2   SPINE SURGERY     herniated disc L4-L5   TRIGGER FINGER RELEASE Left 03/14/2019   Procedure: RELEASE TRIGGER FINGER/A-1 PULLEY LEFT LONG  FINGER;  Surgeon: Murrell Drivers, MD;  Location: [facility];  Service: Orthopedics;  Laterality: Left;  Bier block   TUBAL LIGATION       Allergies[1]  Current Outpatient Medications  Medication Instructions   ALPRAZolam  (XANAX ) 0.5 MG tablet TAKE 1 TABLET(0.5 MG) BY MOUTH TWICE DAILY AS NEEDED FOR ANXIETY   azelastine  (ASTELIN ) 0.1 % nasal spray 2 sprays, Each Nare, 2 times daily, Use in each nostril as directed   b complex vitamins capsule 1 capsule, Daily   Biotin 1 mg, As needed   cetirizine  (ZYRTEC ) 10 mg, Oral, Daily   cyclobenzaprine  (FLEXERIL ) 10 MG tablet TAKE 1 TABLET(10 MG) BY MOUTH AT BEDTIME   fluticasone  (FLONASE ) 50 MCG/ACT nasal spray 2 sprays, Each Nare, Daily   furosemide  (LASIX ) 20 MG tablet One tab by mouth daily as needed for dependent edema during hot weather   glucose blood (CONTOUR NEXT TEST) test strip Use as instructedUSE AS DIRECTED TO TEST BLOOD GLUCOSE ONCE DAILY   HYDROcodone -acetaminophen  (NORCO/VICODIN) 5-325 MG tablet 1 tablet, Oral, Every 8 hours PRN   meclizine  (ANTIVERT ) 25 mg, Oral,  3 times daily PRN   multivitamin (VIT W/EXTRA C) CHEW chewable tablet 1 tablet, Daily   mupirocin  ointment (BACTROBAN ) 2 % APPLY TOPICALLY TO THE AFFECTED AREA TWICE DAILY   promethazine  (PHENERGAN ) 25 mg, Oral, Every 8 hours PRN, for nausea   ramipril  (ALTACE ) 5 MG capsule TAKE 1 CAPSULE(5 MG) BY MOUTH DAILY   rosuvastatin  (CRESTOR ) 5 MG tablet TAKE 1 TABLET(5 MG) BY MOUTH DAILY   tirzepatide  (MOUNJARO ) 2.5 mg, Subcutaneous, Weekly   tirzepatide  (MOUNJARO ) 5 mg, Subcutaneous, Weekly, After completion of 2.5 mg dose for 4 weeks.   valACYclovir  (VALTREX ) 500 MG tablet TAKE 1 TABLET(500 MG) BY MOUTH TWICE DAILY AS NEEDED FOR COLD SORES OR OUTBREAK     VITALS:   Vitals:   12/29/23 0749  BP: (!) 148/67  Pulse: 69  Resp: 20  SpO2: 98%  Weight: 148 lb (67.1 kg)  Height: 5' 4.5 (1.638 m)         12/29/2023   10:00 AM  Montreal Cognitive Assessment    Visuospatial/ Executive (0/5) 4  Naming (0/3) 3  Attention: Read list of digits (0/2) 2  Attention: Read list of letters (0/1) 1  Attention: Serial 7 subtraction starting at 100 (0/3) 3  Language: Repeat phrase (0/2) 1  Language : Fluency (0/1) 0  Abstraction (0/2) 2  Delayed Recall (0/5) 2  Orientation (0/6) 6  Total 24  Adjusted Score (based on education) 24        No data to display           Neurological Exam    Orientation:  Alert and oriented to person, place and time. No aphasia or dysarthria. Fund of knowledge is appropriate. Recent memory impaired and remote memory intact.  Attention and concentration are normal.  Able to name objects and repeat phrases. Delayed recall  2/5 Cranial nerves: There is good facial symmetry. Extraocular muscles are intact and visual fields are full to confrontational testing. Speech is fluent and clear. No tongue deviation. Hearing is intact to conversational tone. Tone: Tone is good throughout. Abnormal movements: No tremors. No Asterixis. No Fasciculations Sensation: Sensation is intact to light touch. Vibration is intact B big toe.  Coordination: The patient has no difficulty with RAM's or FNF bilaterally. Normal FTN Motor: Strength is 5/5 in the bilateral upper and lower extremities. There is no pronator drift. There are no fasciculations noted. DTR's: Deep tendon reflexes are 2/4 bilaterally. Gait and Station: The patient is able to ambulate without difficulty The patient is able to heel toe walk. Gait is cautious and narrow. The patient is able to ambulate in a tandem fashion.       Thank you for allowing us  the opportunity to participate in the care of this nice patient. Please do not hesitate to contact us  for any questions or concerns.   Total time spent on today's visit was 60 minutes dedicated to this patient today, preparing to see patient, examining the patient, ordering tests and/or medications and counseling the patient,  documenting clinical information in the EHR or other health record, independently interpreting results and communicating results to the patient/family, discussing treatment and goals, answering patient's questions and coordinating care.  Cc:  Perri Ronal PARAS, MD  Camie Sevin 12/29/2023 10:26 AM       [1]  Allergies Allergen Reactions   Morphine  And Codeine Nausea And Vomiting    Pt states also caused arm to turn black where medication was administered    Morphine  Other (See Comments)  Prednisone  Hives   Other Nausea And Vomiting    TYLOX   "

## 2023-12-29 ENCOUNTER — Encounter: Payer: Self-pay | Admitting: Physician Assistant

## 2023-12-29 ENCOUNTER — Ambulatory Visit

## 2023-12-29 ENCOUNTER — Ambulatory Visit (INDEPENDENT_AMBULATORY_CARE_PROVIDER_SITE_OTHER): Admitting: Physician Assistant

## 2023-12-29 VITALS — BP 148/67 | HR 69 | Resp 20 | Ht 64.5 in | Wt 148.0 lb

## 2023-12-29 DIAGNOSIS — R413 Other amnesia: Secondary | ICD-10-CM

## 2023-12-29 NOTE — Patient Instructions (Addendum)
 It was a pleasure to see you today at our office.   Recommendations:  Neurocognitive evaluation at our office   MRI of the brain, the radiology office will call you to arrange you appointment   Plasma biomarkers Check labs today   Follow up in 2-3 months  [email or web address]   RECOMMENDATIONS FOR ALL PATIENTS WITH MEMORY PROBLEMS: 1. Continue to exercise (Recommend 30 minutes of walking everyday, or 3 hours every week) 2. Increase social interactions - continue going to [place] and enjoy social gatherings with friends and family 3. Eat healthy, avoid fried foods and eat more fruits and vegetables 4. Maintain adequate blood pressure, blood sugar, and blood cholesterol level. Reducing the risk of stroke and cardiovascular disease also helps promoting better memory. 5. Avoid stressful situations. Live a simple life and avoid aggravations. Organize your time and prepare for the next day in anticipation. 6. Sleep well, avoid any interruptions of sleep and avoid any distractions in the bedroom that may interfere with adequate sleep quality 7. Avoid sugar, avoid sweets as there is a strong link between excessive sugar intake, diabetes, and cognitive impairment We discussed the Mediterranean diet, which has been shown to help patients reduce the risk of progressive memory disorders and reduces cardiovascular risk. This includes eating fish, eat fruits and green leafy vegetables, nuts like almonds and hazelnuts, walnuts, and also use olive oil. Avoid fast foods and fried foods as much as possible. Avoid sweets and sugar as sugar use has been linked to worsening of memory function.  There is always a concern of gradual progression of memory problems. If this is the case, then we may need to adjust level of care according to patient needs. Support, both to the patient and caregiver, should then be put into place.      You have been referred for a  neuropsychological evaluation (i.e., evaluation of memory and thinking abilities). Please bring someone with you to this appointment if possible, as it is helpful for the doctor to hear from both you and another adult who knows you well. Please bring eyeglasses and hearing aids if you wear them.    The evaluation will take approximately 3 hours and has two parts:   The first part is a clinical interview with the neuropsychologist (Dr. Richie or Dr. Gayland). During the interview, the neuropsychologist will speak with you and the individual you brought to the appointment.    The second part of the evaluation is testing with the doctor's technician Neal or Luke). During the testing, the technician will ask you to remember different types of material, solve problems, and answer some questionnaires. Your family member will not be present for this portion of the evaluation.   Please note: We must reserve several hours of the neuropsychologist's time and the psychometrician's time for your evaluation appointment. As such, there is a No-Show fee of $100. If you are unable to attend any of your appointments, please contact our office as soon as possible to reschedule.      DRIVING: Regarding driving, in patients with progressive memory problems, driving will be impaired. We advise to have someone else do the driving if trouble finding directions or if minor accidents are reported. Independent driving assessment is available to determine safety of driving.   If you are interested in the driving assessment, you can contact the following:  [facility] in [place] [phone]  [facility] [phone]  [facility] [phone]  [facility] [phone] or [phone]  FALL PRECAUTIONS: Be cautious when walking. Scan the area for obstacles that may increase the risk of trips and falls. When getting up in the mornings, sit up at the edge of the bed for a  few minutes before getting out of bed. Consider elevating the bed at the head end to avoid drop of blood pressure when getting up. Walk always in a well-lit room (use night lights in the walls). Avoid area rugs or power cords from appliances in the middle of the walkways. Use a walker or a cane if necessary and consider physical therapy for balance exercise. Get your eyesight checked regularly.  FINANCIAL OVERSIGHT: Supervision, especially oversight when making financial decisions or transactions is also recommended.  HOME SAFETY: Consider the safety of the kitchen when operating appliances like stoves, microwave oven, and blender. Consider having supervision and share cooking responsibilities until no longer able to participate in those. Accidents with firearms and other hazards in the house should be identified and addressed as well.   ABILITY TO BE LEFT ALONE: If patient is unable to contact 911 operator, consider using LifeLine, or when the need is there, arrange for someone to stay with patients. Smoking is a fire hazard, consider supervision or cessation. Risk of wandering should be assessed by caregiver and if detected at any point, supervision and safe proof recommendations should be instituted.  MEDICATION SUPERVISION: Inability to self-administer medication needs to be constantly addressed. Implement a mechanism to ensure safe administration of the medications.      Mediterranean Diet A Mediterranean diet refers to food and lifestyle choices that are based on the traditions of countries located on the [employer] [place]. This way of eating has been shown to help prevent certain conditions and improve outcomes for people who have chronic diseases, like kidney disease and heart disease. What are tips for following this plan? Lifestyle  Cook and eat meals together with your family, when possible. Drink enough fluid to keep your urine clear or pale yellow. Be physically active every day.  This includes: Aerobic exercise like running or swimming. Leisure activities like gardening, walking, or housework. Get 7-8 hours of sleep each night. If recommended by your health care provider, drink red wine in moderation. This means 1 glass a day for nonpregnant women and 2 glasses a day for men. A glass of wine equals 5 oz (150 mL). Reading food labels  Check the serving size of packaged foods. For foods such as rice and pasta, the serving size refers to the amount of cooked product, not dry. Check the total fat in packaged foods. Avoid foods that have saturated fat or trans fats. Check the ingredients list for added sugars, such as corn syrup. Shopping  At the grocery store, buy most of your food from the areas near the walls of the store. This includes: Fresh fruits and vegetables (produce). Grains, beans, nuts, and seeds. Some of these may be available in unpackaged forms or large amounts (in bulk). Fresh seafood. Poultry and eggs. Low-fat dairy products. Buy whole ingredients instead of prepackaged foods. Buy fresh fruits and vegetables in-season from local farmers markets. Buy frozen fruits and vegetables in resealable bags. If you do not have access to quality fresh seafood, buy precooked frozen shrimp or canned fish, such as tuna, salmon, or sardines. Buy small amounts of raw or cooked vegetables, salads, or olives from the deli or salad bar at your store. Stock your pantry so you always have certain foods on hand, such as olive oil,  canned tuna, canned tomatoes, rice, pasta, and beans. Cooking  Cook foods with extra-virgin olive oil instead of using butter or other vegetable oils. Have meat as a side dish, and have vegetables or grains as your main dish. This means having meat in small portions or adding small amounts of meat to foods like pasta or stew. Use beans or vegetables instead of meat in common dishes like chili or lasagna. Experiment with different cooking methods.  Try roasting or broiling vegetables instead of steaming or sauteing them. Add frozen vegetables to soups, stews, pasta, or rice. Add nuts or seeds for added healthy fat at each meal. You can add these to yogurt, salads, or vegetable dishes. Marinate fish or vegetables using olive oil, lemon juice, garlic, and fresh herbs. Meal planning  Plan to eat 1 vegetarian meal one day each week. Try to work up to 2 vegetarian meals, if possible. Eat seafood 2 or more times a week. Have healthy snacks readily available, such as: Vegetable sticks with hummus. Greek yogurt. Fruit and nut trail mix. Eat balanced meals throughout the week. This includes: Fruit: 2-3 servings a day Vegetables: 4-5 servings a day Low-fat dairy: 2 servings a day Fish, poultry, or lean meat: 1 serving a day Beans and legumes: 2 or more servings a week Nuts and seeds: 1-2 servings a day Whole grains: 6-8 servings a day Extra-virgin olive oil: 3-4 servings a day Limit red meat and sweets to only a few servings a month What are my food choices? Mediterranean diet Recommended Grains: Whole-grain pasta. Brown rice. Bulgar wheat. Polenta. Couscous. Whole-wheat bread. [place] Made[place]. Vegetables: Artichokes. Beets. Broccoli. Cabbage. Carrots. Eggplant. Green beans. Chard. Kale. Spinach. Onions. Leeks. Peas. Squash. Tomatoes. Peppers. Radishes. Fruits: Apples. Apricots. Avocado. Berries. Bananas. Cherries. Dates. Figs. Grapes. Lemons. Melon. Oranges. Peaches. Plums. Pomegranate. Meats and other protein foods: Beans. Almonds. Sunflower seeds. Pine nuts. Peanuts. Cod. Salmon. Scallops. Shrimp. Tuna. Tilapia. Clams. Oysters. Eggs. Dairy: Low-fat milk. Cheese. Greek yogurt. Beverages: Water . Red wine. Herbal tea. Fats and oils: Extra virgin olive oil. Avocado oil. Grape seed oil. Sweets and desserts: Greek yogurt with honey. Baked apples. Poached pears. Trail mix. Seasoning and other foods: Basil. Cilantro. Coriander. Cumin. Mint.  Parsley. Sage. Rosemary. Tarragon. Garlic. Oregano. Thyme. Pepper. Balsalmic vinegar. Tahini. Hummus. Tomato sauce. Olives. Mushrooms. Limit these Grains: Prepackaged pasta or rice dishes. Prepackaged cereal with added sugar. Vegetables: Deep fried potatoes (french fries). Fruits: Fruit canned in syrup. Meats and other protein foods: Beef. Pork. Lamb. Poultry with skin. Hot dogs. [place]. Dairy: Ice cream. Sour cream. Whole milk. Beverages: Juice. Sugar-sweetened soft drinks. Beer. Liquor and spirits. Fats and oils: Butter. Canola oil. Vegetable oil. Beef fat (tallow). Lard. Sweets and desserts: Cookies. Cakes. Pies. Candy. Seasoning and other foods: Mayonnaise. Premade sauces and marinades. The items listed may not be a complete list. Talk with your dietitian about what dietary choices are right for you. Summary The Mediterranean diet includes both food and lifestyle choices. Eat a variety of fresh fruits and vegetables, beans, nuts, seeds, and whole grains. Limit the amount of red meat and sweets that you eat. Talk with your health care provider about whether it is safe for you to drink red wine in moderation. This means 1 glass a day for nonpregnant women and 2 glasses a day for men. A glass of wine equals 5 oz (150 mL). This information is not intended to replace advice given to you by your health care provider. Make sure you discuss any questions you  have with your health care provider. Document Released: 08/13/2015 Document Revised: 09/15/2015 Document Reviewed: 08/13/2015 Elsevier Interactive Patient Education  2017 [employer].

## 2024-01-01 DIAGNOSIS — H8112 Benign paroxysmal vertigo, left ear: Secondary | ICD-10-CM | POA: Diagnosis not present

## 2024-01-02 ENCOUNTER — Telehealth: Payer: Self-pay

## 2024-01-02 NOTE — Telephone Encounter (Signed)
 Only one test has been completed they are still waiting for 2 more.  Copied from CRM #[id number]. Topic: Clinical - Lab/Test Results >> Dec 29, 2023 12:39 PM Melissa Benson wrote: Reason for CRM: need to discuss lab results at request of neurologist- [phone] or [phone]

## 2024-01-08 ENCOUNTER — Telehealth: Payer: Self-pay | Admitting: Internal Medicine

## 2024-01-09 LAB — QUEST AD-DETECT™, BETA-AMYLOID 42/40 RATIO, PLASMA
ABETA 40: 207 pg/mL
ABETA 42/40 RATIO: 0.174
ABETA 42: 36 pg/mL

## 2024-01-09 LAB — QUEST AD-DETECT® PHOSPHORYLATED TAU181(P-TAU181), PLASMA: QUEST AD DETECT PTAU181, PLASMA: 1.08 pg/mL — ABNORMAL HIGH

## 2024-01-09 LAB — QUEST AD-DETECT PHOSPHORYLATED TAU217(P-TAU217), PLASMA: Quest Detect PTAU217, Plasma: 0.25 pg/mL — ABNORMAL HIGH

## 2024-01-09 NOTE — Telephone Encounter (Signed)
 Done

## 2024-01-15 ENCOUNTER — Other Ambulatory Visit: Payer: Self-pay | Admitting: Internal Medicine

## 2024-01-17 ENCOUNTER — Telehealth: Payer: Self-pay | Admitting: Physician Assistant

## 2024-01-17 ENCOUNTER — Encounter: Payer: Self-pay | Admitting: Physician Assistant

## 2024-01-17 DIAGNOSIS — R413 Other amnesia: Secondary | ICD-10-CM

## 2024-01-17 NOTE — Telephone Encounter (Signed)
 Pt called in this morning and she wants to know when she will be scheduled for the MRI. Thanks

## 2024-01-17 NOTE — Telephone Encounter (Signed)
 MRI Brain W/O ordered today per last ov notes.

## 2024-01-23 ENCOUNTER — Ambulatory Visit: Payer: Self-pay | Admitting: Psychology

## 2024-01-23 ENCOUNTER — Ambulatory Visit: Payer: Self-pay

## 2024-01-23 DIAGNOSIS — R4189 Other symptoms and signs involving cognitive functions and awareness: Secondary | ICD-10-CM

## 2024-01-23 DIAGNOSIS — F418 Other specified anxiety disorders: Secondary | ICD-10-CM

## 2024-01-23 NOTE — Progress Notes (Signed)
 "  NEUROPSYCHOLOGICAL EVALUATION [place]. [facility]  [place] Department of Neurology  Date of Evaluation: 01/23/2024  REASON FOR REFERRAL   Melissa Benson is a 64 year old, right-handed, White female with 14 years of formal education. She was referred for neuropsychological evaluation by Camie Sevin, PA-C, to assess current neurocognitive functioning, document potential cognitive deficits, and assist with treatment planning. This is her first neuropsychological evaluation.  SUMMARY OF RESULTS   Premorbid cognitive abilities are estimated to be in the average range based on word reading and sociodemographic factors. Consistent with this baseline estimate, performance today was intact across all domains, including attention/working memory, processing speed, executive functioning, language, visuospatial abilities, and learning/memory.   On self-report questionnaires, she reported moderate symptoms of anxiety and mild symptoms of depression.  DIAGNOSTIC IMPRESSION   Results of the current evaluation indicated normal cognitive functioning, with no impairments observed in any domain. There is no evidence of a neurocognitive disorder at this time. Functional independence is maintained.  Given her family history and prior Alzheimers disease workup, it is important to remain mindful of the possibility of emerging neurodegenerative changes over time. However, these factors are not determinative or diagnostic; rather, they provide additional context regarding potential risk. Ongoing monitoring and follow-up as appropriate are recommended, but at present, she should focus on maintaining a healthy and balanced life.  It is particularly important to address mood concerns, as she is currently experiencing untreated depression and anxiety. These symptoms can significantly impact daily functioning and may exacerbate minor cognitive difficulties, leading to forgetfulness or errors at work that  are disproportionate to her underlying cognitive abilities. Stabilizing mood at this time will not only improve quality of life but also aid in differentiating future cognitive changes if they occur. At present, there is no evidence from testing to suggest she is incapable of working or managing her home responsibilities. Mood-related symptoms, however, may be the most functionally disabling factor at this time and warrant targeted intervention.  ICD-10 Codes: F41.8 Anxiety with depression; R41.89 Subjective memory concerns  RECOMMENDATIONS   In consultation with your doctor, schedule cognitive reevaluation on an as-needed basis to assess for cognitive decline and update treatment recommendations. Reevaluation should occur during a period of medical and affective stability.  Patient is highly encouraged to prioritize mental health treatment. While it is understandable that she was concerned about the potential negative side effects that mood medications had on her memory previously, she is advised to return to her prescribing provider to discuss alternative alternative medication options as appropriate. She is also encouraged to consider additional treatment modalities, such as mindfulness practices, relaxation techniques, and/or counseling, to support her overall mental health and well-being.  Compensatory strategies are listed below, several of which the patient is already utilizing to manage work-related difficulties. If these compensatory approaches are no longer sufficient to adequately support functioning, the patient may consider cognitive therapy with a speech therapist to further develop effective strategies and improve performance.  Prioritize physical health through diet, exercise, and sleep. Regular physical activity supports cardiovascular health, improves mood, and helps preserve mobility and independence. Aim for at least 150 minutes of moderate aerobic exercise per week (e.g., brisk  walking, swimming, gardening). A brain-healthy diet such as the Mediterranean or MIND diet is rich in fruits, vegetables, whole grains, healthy fats, and lean proteins, and has been associated with reduced risk of cognitive decline. Additionally, getting adequate, quality sleep and managing chronic conditions with the help of healthcare providers are essential components of healthy  aging.  Continue to stay socially and mentally engaged. Maintaining strong social connections and regularly stimulating your brain can help protect against cognitive decline. This includes staying connected with friends and family, volunteering, or participating in community groups. Mentally engaging activities--such as reading, doing puzzles, playing strategy games, or learning a new language or musical instrument--promote brain plasticity. If you are interested in activities to support cognitive engagement, this site offers a variety of apps and games organized by difficulty level:  https://www.barrowneuro.org/get-to-know-barrow/centers-programs/neurorehabilitation-center/neuro-rehab-apps-and-games/  Consider implementing compensatory strategies to maximize independence and maintain daily functioning. Examples include:  Adhere to routine. Compensatory strategies work best when they are used consistently. Use a planner, calendar, or white board that has the schedule and important events for the day clearly listed to reference and cross off when tasks are complete.  Ask for written information, especially if it is new or unfamiliar (e.g., information provided at a doctor's appointment).  Create an organized environment. Keep items that can be easily misplaced in a sensible location and get into the habit of always returning the items to those places. Pay attention and reduce distractions. Make a point of focusing attention on information you want to remember. One-on-one interaction is more likely to facilitate attention and  minimize distraction. Make eye contact and repeat the information out loud after you hear it. Reduce interruptions or distractions especially when attempting to learn new information.  Create associations. When learning something new, think about and understand the information. Explain it in your own words or try to associate it with something you already know. Take notes to help remember important details. Evaluate goals and plan accordingly. When confronted by many different tasks, begin by making a list that prioritizes each task and estimates the time it will take to complete. Break down complicated tasks into smaller, more manageable steps. Focus on one task at a time and complete each task before starting another. Avoid multitasking.  DISPOSITION   Patient will follow up with the referring provider, Ms. Wertman. No follow-up neuropsychological testing was scheduled at this time. Please feel free to refer the patient for repeated evaluation if she shows a significant change in neurocognitive status. She and her husband will be provided verbal feedback in approximately one week regarding the findings and impression during this visit.  The remainder of the report includes the details of the patient's background and a table of results from the current evaluation, which support the summary and recommendations described above.  BACKGROUND   History of Presenting Illness: The following information was obtained from a review of medical records and an interview with the patient and her husband, Nelwyn. Briefly, the patient was most recently seen by Camie Sevin, PA-C, at [facility] on 12/29/2023 for cognitive concerns over the past two years. She specifically described difficulty recalling work-related systems she has used for thirty years, now relying more heavily on sticky notes to remember system codes. Due to a family history of Alzheimer's disease, plasma biomarkers were obtained with the  following results: A?42 = 36, A?40 = 207, A?42/40 ratio = 0.174, pTau181 = 1.08, and pTau217 = 0.25. She was referred for neuropsychological evaluation accordingly.  Cognitive Functioning: During todays appointment, the patient described cognitive concerns similar to those previously reported to neurology. She noted the greatest difficulties at work, particularly with learning new procedures. She often has to re-ask questions and relies more on sticky notes. She has noticed that coworkers have become more snippy with her over these changes. She has not received  any formal feedback, reprimands, or negative performance evaluations at this point. She reports functioning better with older systems she is more familiar with; however, she still occasionally forgets details related to those systems or misses emails regarding system changes. In her personal life, she also relies heavily on reminders. For example, when babysitting or dog-sitting, she writes down feeding and medication times to ensure tasks have been completed. She feels there has been a decline in her attention, particularly when focusing on reading to learn new material. She also reports slowed processing speed, word-finding difficulties, and organizational challenges. She noted that she has never been a management consultant. She denied any problems with navigation. Her husband corroborated her concerns, reporting that he has noticed some short-term memory difficulties, though he did not describe them as severe and stated that she generally remembers conversations.  Physical Functioning: Patient denied difficulty with sleep initiation but reported waking several times during the night to use the restroom, sometimes being able to return to sleep and sometimes not. She was previously diagnosed with obstructive sleep apnea and used CPAP regularly until significant weight loss following bariatric surgery, after which she discontinued use. She acknowledged that  her sleep quality was better while using CPAP and is considering repeat sleep testing to determine whether treatment is still indicated. Appetite is stable, with no reported changes in taste or smell. Vision and hearing are stable. Headaches have improved since starting magnesium . She denied balance difficulties, falls, and tremors.  Emotional Functioning: Patient described her recent mood as more anxious and depressed, particularly at work. She noted that her memory changes contribute significantly to these mood difficulties. She denied any suicidal ideation.  Neuroimaging: MRI of the brain is scheduled for 01/24/2024. Prior CT of the head (08/01/2023) was unremarkable.  Other Relevant Medical History: Remarkable for hypertension, hyperlipidemia, chronic pancreatitis, type 2 diabetes, and history of gastric bypass. Please refer to the medical record for a more comprehensive problem list. No history of stroke, CNS infection, or seizure was reported. She had one episode of brief loss of consciousness while taking prep for colonoscopy, resulting in a concussion, but denied any other significant head injuries.  Current Medications: Per record, azelastine , B complex vitamins, biotin, cetirizine , cyclobenzaprine , fluticasone , furosemide , meclizine , multivitamin, mupirocin , promethazine , ramipril , rosuvastatin , tirzepatide , and valacyclovir .   Functional Status: Patient continues to drive without reported accidents, traffic violations, or navigational difficulties. She independently manages her medications, although she occasionally misses doses. Most of her bills are paid automatically with assistance from her son. One bill was not set up for automatic payment and was paid late, resulting in a late fee. She remains independent in all basic activities of daily living.  Family Neurological History: Remarkable for Alzheimer's disease in the patient's mother and a maternal aunt; two additional maternal aunts are  suspected but untested.  Psychiatric History: Remarkable for anxiety and depression. She previously took medication but discontinued it due to memory-related side effects. She has a history of counseling, though she is not currently in treatment. She denied suicidal ideation, hallucinations, and prior psychiatric hospitalizations.  Substance Use History: Patient denied current use of alcohol, nicotine, marijuana, and other illicit substances. Additionally, there is no reported history of past problematic substance use.  Social and Developmental History: Patient was born in [place], [place]. History of perinatal complications and developmental delays was not reported. She is married and has two children. She currently lives with her husband and one of her children.  Educational and Occupational History: No history of childhood  learning disability, special education services, or grade retention was reported. 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 She earned an associate degree in accounting. 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  BEHAVIORAL OBSERVATIONS   Patient arrived on time and was accompanied by her husband, Nelwyn. She ambulated independently and without gait disturbance. She was alert and fully oriented. She was appropriately groomed and dressed for the setting. No significant sensory or motor abnormalities were observed. Vision (with glasses) and hearing were adequate for testing purposes. Speech was of normal rate, prosody, and volume. No conversational word-finding difficulties, paraphasic errors, or dysarthria were observed. Comprehension was conversationally intact. Thought processes were linear, logical, and coherent. Thought content was organized and devoid of delusions. Insight appeared appropriate. Affect was even and  congruent with mood. She was cooperative and appeared to give adequate effort during testing, including on standalone and embedded measures of performance validity. Results are thought to accurately reflect her cognitive functioning at this time.  NEUROPSYCHOLOGICAL TESTING RESULTS   Tests Administered: Animal Naming Test; Beck Anxiety Inventory (BAI); Beck Depression Inventory II (BDI-II); Brief Visuospatial Memory Test-Revised (BVMT-R) - Form 1; Controlled Oral Word Association Test (COWAT): FAS; Hopkins Verbal Learning Test-Revised (HVLT-R) - From 1; Judgment of Line Orientation (JLO) - Form V; Neuropsychological Assessment Battery (NAB) - Subtest(s): Naming Form 1; Standalone performance validity tests (PVTs); Test of Premorbid Functioning (TOPF); Trail Making Test (TMT); Wechsler Adult Intelligence Scale Fifth Edition (WAIS-5) - Subtest(s): Similarities, Clinical Cytogeneticist, Matrix Reasoning, Digit Sequencing, Coding, Running Digits, Symbol Search, Symbol Span; Wechsler Memory Scale Fourth Edition (WMS-IV) - Subtest(s): Logical Memory (LM); and [place]  Card Sorting Test 64 Card Version (WCST-64).  Test results are provided in the table below. Whenever possible, the patient's scores were compared against age-, sex-, and education-corrected normative samples. Interpretive descriptions are based on the AACN consensus conference statement on uniform labeling (Guilmette et al., 2020).  PREMORBID FUNCTIONING RAW  RANGE  TOPF 31 StdS=90 Average  ATTENTION & WORKING MEMORY RAW  RANGE  WAIS-5 Digit Sequencing -- ss=12 High Average  WAIS-5 Running Digits -- ss=15 Above Average  WAIS-5 Symbol Span -- ss=13 High Average  PROCESSING SPEED RAW  RANGE  Trails A 38''0e T=45 Average  WAIS-5 Coding  -- ss=9 Average  WAIS-5 Symbol Search -- ss=8 Average  EXECUTIVE FUNCTION RAW  RANGE  Trails B 66''0e T=51 Average  WAIS-5 Similarities -- ss=10 Average  COWAT Letter Fluency 8+8+10 T=37 Low Average  WCST-64 Total  Errors 12 T=57 High Average  WCST-64 Perseverative Errors 5 T=61 High Average  WCST-64 Nonperseverative Errors 7 T=49 Average  WCST-64 Categories Completed 4 >16%ile WNL  WCSR-64 FMS 1 -- --  LANGUAGE RAW  RANGE  COWAT Letter Fluency 8+8+10 T=37 Low Average  Animal Naming Test 20 T=49 Average  NAB Naming Test 31/31 T=56 WNL  VISUOSPATIAL RAW  RANGE  WAIS-5 Block Design -- ss=9 Average  JLO C/S=23/30 40%ile Average  BVMT-R Copy Trial 12/12 -- WNL  VERBAL LEARNING & MEMORY RAW  RANGE  HVLT-R Learning Trials (6+10+10)/36 T=49 Average  HVLT-R Delayed Recall 9/12 T=47 Average  HVLT-R Recognition Hits 12 -- --  HVLT-R Recognition False Positives 1 -- --  HVLT-R Discrimination Index 11 T=52 Average  WMS-IV LM-I  (14+13)/50 ss=11 Average  WMS-IV LM-II  (11+12)/50 ss=11 Average  WMS-IV LM Recognition  (15+14)/30 >75%ile High Average to Exceptionally High  VISUAL LEARNING & MEMORY RAW  RANGE  BVMT-R Total Recall (5+6+9)/36 T=46 Average  BVMT-R Delayed Recall 7/12 T=44 Average  BVMT-R Recognition Hits 5 11-16%ile Low Average  BVMT-R Recognition False Alarms 0 >16%ile WNL  BVMT-R Recognition Discrimination Index 5 11-16%ile Low Average  QUESTIONNAIRES RAW  RANGE  BDI 18 -- Mild  BAI 17 -- Moderate  *Note: ss = scaled score; StdS = standard score; T = t-score; C/S = corrected raw score; WNL = within normal limits; BNL= below normal limits; D/C = discontinued. Scores from skewed distributions are typically interpreted as WNL (>=16th %ile) or BNL (<16th %ile).   INFORMED CONSENT   Patient was provided with a verbal description of the nature and purpose of the neuropsychological evaluation. Also reviewed were the foreseeable risks and/or discomforts and benefits of the procedure, limits of confidentiality, and mandatory reporting requirements of this provider. Patient was given the opportunity to have their questions answered. Oral consent to participate was provided by the patient.   This  report was prepared as part of a clinical evaluation and is not intended for forensic use.  SERVICE   This evaluation was conducted by Renda Beckwith, Psy.D. In addition to time spent directly with the patient, total professional time (120 minutes) includes record review, integration of relevant medical history, test selection, interpretation of findings, and report preparation. A technician, Evalene Pizza, B.S., provided testing and scoring assistance (159 minutes).  Psychiatric Diagnostic Evaluation Services (Professional): 09208 x 1 Neuropsychological Testing Evaluation Services (Professional): 03867 x 1 Neuropsychological Testing Evaluation Services (Professional): 03866 x 2 Neuropsychological Test Administration and Scoring (Technician): [phone] x 1 Neuropsychological Test Administration and Scoring (Technician): [phone] x 4  This report was generated using voice recognition software. While this document has been carefully reviewed, transcription errors may be present. I apologize in advance for any inconvenience. Please contact me if further clarification is needed.            Renda Beckwith, Psy.D.             Neuropsychologist  "

## 2024-01-23 NOTE — Progress Notes (Signed)
" ° °  Psychometrician Note   Cognitive testing was administered to Melissa Benson by Evalene Pizza, B.S. (psychometrist) under the supervision of Dr. Renda Beckwith, Psy.D., licensed psychologist on 01/23/2024. Ms. Paz did not appear overtly distressed by the testing session per behavioral observation or responses across self-report questionnaires. Rest breaks were offered.   The battery of tests administered was selected by Dr. Renda Beckwith, Psy.D. with consideration to Ms. Stolarz's current level of functioning, the nature of her symptoms, emotional and behavioral responses during interview, level of literacy, observed level of motivation/effort, and the nature of the referral question. This battery was communicated to the psychometrist. Communication between Dr. Renda Beckwith, Psy.D. and the psychometrist was ongoing throughout the evaluation and Dr. Renda Beckwith, Psy.D. was immediately accessible at all times. Dr. Renda Beckwith, Psy.D. provided supervision to the psychometrist on the date of this service to the extent necessary to assure the quality of all services provided.    FELECITY LEMASTER will return within approximately 1-2 weeks for an interactive feedback session with Dr. Beckwith at which time her test performances, clinical impressions, and treatment recommendations will be reviewed in detail. Ms. Nippert understands she can contact our office should she require our assistance before this time.  A total of 159 minutes of billable time were spent face-to-face with Ms. Sieloff by the psychometrist. This includes both test administration and scoring time. Billing for these services is reflected in the clinical report generated by Dr. Renda Beckwith, Psy.D.  This note reflects time spent with the psychometrician and does not include test scores or any clinical interpretations made by Dr. Beckwith. The full report will follow in a separate note. "

## 2024-01-24 ENCOUNTER — Ambulatory Visit
Admission: RE | Admit: 2024-01-24 | Discharge: 2024-01-24 | Disposition: A | Discharge status: Home / Self Care | Source: Ambulatory Visit | Attending: Physician Assistant | Admitting: Physician Assistant

## 2024-01-24 DIAGNOSIS — R413 Other amnesia: Secondary | ICD-10-CM

## 2024-01-28 ENCOUNTER — Ambulatory Visit: Payer: Self-pay | Admitting: Physician Assistant

## 2024-01-31 ENCOUNTER — Encounter: Payer: Self-pay | Admitting: Physician Assistant

## 2024-01-31 ENCOUNTER — Ambulatory Visit: Payer: Self-pay | Admitting: Psychology

## 2024-01-31 DIAGNOSIS — R4189 Other symptoms and signs involving cognitive functions and awareness: Secondary | ICD-10-CM

## 2024-01-31 DIAGNOSIS — F418 Other specified anxiety disorders: Secondary | ICD-10-CM

## 2024-01-31 NOTE — Progress Notes (Signed)
 No answer

## 2024-01-31 NOTE — Progress Notes (Signed)
" ° °  NEUROPSYCHOLOGY FEEDBACK SESSION [place]. [facility]  [place] Department of Neurology  Date of Feedback Session: 01/31/2024  REASON FOR REFERRAL   Melissa Benson is a 64 year old, right-handed, White female with 14 years of formal education. She was referred for neuropsychological evaluation by Camie Sevin, PA-C, to assess current neurocognitive functioning, document potential cognitive deficits, and assist with treatment planning. This is her first neuropsychological evaluation.  FEEDBACK   Patient completed a comprehensive neuropsychological evaluation on 01/23/2024. Please refer to that encounter for the full report and recommendations. Briefly, results indicated normal cognitive functioning, with no impairments observed in any domain. Although her family history and prior Alzheimers workup warrant continued awareness of potential neurodegenerative risk, these factors are not diagnostic, and current emphasis should be on healthy functioning with appropriate monitoring. Her untreated depression and anxiety are likely the primary drivers of functional concerns, as mood symptoms can impair daily functioning and amplify subjective cognitive concerns.  Due to weather-related transportation safety concerns, a phone call feedback session was conducted with the patient. She participated from her office, while I participated from my office. Results of her neuropsychological evaluation were reviewed. She was given the opportunity to ask questions, which were addressed, and was encouraged to reach out should any additional questions arise. She was offered a mailed copy of the report but elected to access it through the electronic medical record instead.  DISPOSITION   No follow-up neuropsychological testing was scheduled at this time. Please feel free to refer the patient for repeated evaluation if she shows a significant change in neurocognitive status.  SERVICE   This feedback  session was conducted by Renda Beckwith, Psy.D. One unit of 03867 (35 minutes) was billed for Dr. Beckwith' time spent in preparing, conducting, and documenting the current feedback session.  This report was generated using voice recognition software. While this document has been carefully reviewed, transcription errors may be present. I apologize in advance for any inconvenience. Please contact me if further clarification is needed.  "

## 2024-01-31 NOTE — Telephone Encounter (Signed)
Pt. Calling back for results.

## 2024-02-01 NOTE — Progress Notes (Signed)
 Melissa Benson                                          MRN: [id number]   02/01/2024   The [facility] Quality Team Specialist reviewed this patient medical record for the purposes of chart review for care gap closure. The following were reviewed: chart review for care gap closure-controlling blood pressure.    [facility] Quality Team

## 2024-04-09 ENCOUNTER — Ambulatory Visit: Admitting: Endocrinology
